# Patient Record
Sex: Male | Born: 1965 | Race: Black or African American | Hispanic: No | Marital: Single | Smoking: Current some day smoker
Health system: Southern US, Community
[De-identification: ages and names within clinical notes are randomized; demographics above are authoritative.]

## PROBLEM LIST (undated history)

## (undated) DIAGNOSIS — J449 Chronic obstructive pulmonary disease, unspecified: Secondary | ICD-10-CM

## (undated) DIAGNOSIS — F99 Mental disorder, not otherwise specified: Secondary | ICD-10-CM

## (undated) DIAGNOSIS — I509 Heart failure, unspecified: Secondary | ICD-10-CM

## (undated) DIAGNOSIS — E119 Type 2 diabetes mellitus without complications: Secondary | ICD-10-CM

## (undated) DIAGNOSIS — I10 Essential (primary) hypertension: Secondary | ICD-10-CM

## (undated) HISTORY — PX: NO PAST SURGERIES: SHX2092

---

## 2010-03-23 ENCOUNTER — Emergency Department: Payer: Self-pay | Admitting: Emergency Medicine

## 2010-04-11 ENCOUNTER — Ambulatory Visit: Payer: Self-pay | Admitting: Urology

## 2011-04-12 ENCOUNTER — Emergency Department: Payer: Self-pay | Admitting: Internal Medicine

## 2012-08-02 ENCOUNTER — Emergency Department: Payer: Self-pay | Admitting: Emergency Medicine

## 2014-06-17 ENCOUNTER — Emergency Department: Payer: Self-pay | Admitting: Internal Medicine

## 2016-08-10 ENCOUNTER — Encounter: Payer: Self-pay | Admitting: Emergency Medicine

## 2016-08-10 ENCOUNTER — Emergency Department
Admission: EM | Admit: 2016-08-10 | Discharge: 2016-08-10 | Disposition: A | Discharge status: Home / Self Care | Payer: Medicaid Other | Attending: Emergency Medicine | Admitting: Emergency Medicine

## 2016-08-10 ENCOUNTER — Emergency Department: Payer: Medicaid Other

## 2016-08-10 DIAGNOSIS — I10 Essential (primary) hypertension: Secondary | ICD-10-CM | POA: Insufficient documentation

## 2016-08-10 DIAGNOSIS — F17290 Nicotine dependence, other tobacco product, uncomplicated: Secondary | ICD-10-CM | POA: Insufficient documentation

## 2016-08-10 DIAGNOSIS — E119 Type 2 diabetes mellitus without complications: Secondary | ICD-10-CM | POA: Insufficient documentation

## 2016-08-10 HISTORY — DX: Essential (primary) hypertension: I10

## 2016-08-10 HISTORY — DX: Type 2 diabetes mellitus without complications: E11.9

## 2016-08-10 LAB — BASIC METABOLIC PANEL
Anion gap: 9 (ref 5–15)
BUN: 13 mg/dL (ref 6–20)
CHLORIDE: 104 mmol/L (ref 101–111)
CO2: 24 mmol/L (ref 22–32)
Calcium: 9.3 mg/dL (ref 8.9–10.3)
Creatinine, Ser: 0.99 mg/dL (ref 0.61–1.24)
GFR calc non Af Amer: >60 mL/min (ref >60)
Glucose, Bld: 188 mg/dL — ABNORMAL HIGH (ref 65–99)
POTASSIUM: 3.8 mmol/L (ref 3.5–5.1)
SODIUM: 137 mmol/L (ref 135–145)

## 2016-08-10 LAB — CBC
HEMATOCRIT: 44.6 % (ref 40.0–52.0)
Hemoglobin: 15.4 g/dL (ref 13.0–18.0)
MCH: 30.4 pg (ref 26.0–34.0)
MCHC: 34.4 g/dL (ref 32.0–36.0)
MCV: 88.4 fL (ref 80.0–100.0)
PLATELETS: 345 10*3/uL (ref 150–440)
RBC: 5.05 MIL/uL (ref 4.40–5.90)
RDW: 14.4 % (ref 11.5–14.5)
WBC: 7.2 10*3/uL (ref 3.8–10.6)

## 2016-08-10 LAB — TROPONIN I

## 2016-08-10 MED ORDER — CLONIDINE HCL 0.1 MG PO TABS
0.1000 mg | ORAL_TABLET | Freq: Once | ORAL | Status: AC
  Administered 2016-08-10: 0.1 mg via ORAL
  Filled 2016-08-10 (×2): qty 1

## 2016-08-10 MED ORDER — AMLODIPINE BESYLATE 10 MG PO TABS: 10.0000 mg | ORAL_TABLET | Freq: Every day | ORAL | 1 refills | Status: DC

## 2016-08-10 NOTE — Discharge Instructions (Signed)
As we discussed today's workup is normal. Please start taking Maxzide instead of 2 tablets each morning take 1 tablet in the morning, 1 tablet in the evening. Please also begin your new medication amlodipine 1 tablet each morning. Please follow-up with your primary care doctor in 1 week for recheck. Return to the emergency department for any acutely concerning symptoms.

## 2016-08-10 NOTE — ED Triage Notes (Signed)
Presents from group home with HTN. Takes meds for same.

## 2016-08-10 NOTE — ED Provider Notes (Signed)
[facility] Emergency Department Provider Note  Time seen: 4:56 PM  I have reviewed the triage vital signs and the nursing notes.   HISTORY  Chief Complaint Hypertension    HPI Paul Hernandez is a 50 y.o. male with a past medical history of diabetes, hypertension, who presents to the emergency department for an elevated blood pressure. According to the patientthey checked his blood pressure tends group home and it was elevated so they sent him to the emergency department for evaluation. Patient takes blood pressure medications, does not know the names of medications, but states has not missed any doses as they dose his medications at the group home for him. Denies any symptoms. Denies any chest pain.  Past Medical History:  Diagnosis Date  . Diabetes mellitus without complication (HCC)   . Hypertension     There are no active problems to display for this patient.   History reviewed. No pertinent surgical history.  Prior to Admission medications   Not on File    No Known Allergies  No family history on file.  Social History Social History  Substance Use Topics  . Smoking status: Current Some Day Smoker  . Smokeless tobacco: Current User  . Alcohol use No    Review of Systems Constitutional: Negative for fever Cardiovascular: Negative for chest pain. Respiratory: Negative for shortness of breath. Gastrointestinal: Negative for abdominal pain Neurological: Negative for headache 10-point ROS otherwise negative.  ____________________________________________   PHYSICAL EXAM:  VITAL SIGNS: ED Triage Vitals  Enc Vitals Group     BP 08/10/16 1416 (!) 182/127     Pulse Rate 08/10/16 1415 (!) 119     Resp 08/10/16 1415 20     Temp 08/10/16 1415 98.3 F (36.8 C)     Temp Source 08/10/16 1415 Oral     SpO2 08/10/16 1415 96 %     Weight 08/10/16 1409 247 lb (112 kg)     Height 08/10/16 1409 6' (1.829 m)     Head Circumference --      Peak  Flow --      Pain Score 08/10/16 1630 0     Pain Loc --      Pain Edu? --      Excl. in GC? --     Constitutional: Alert and oriented. Well appearing and in no distress. Eyes: Normal exam ENT   Head: Normocephalic and atraumatic.   Mouth/Throat: Mucous membranes are moist. Cardiovascular: Regular rhythm, rate around 100-110 bpm Respiratory: Normal respiratory effort without tachypnea nor retractions. Breath sounds are clear  Gastrointestinal: Soft and nontender. No distention.   Musculoskeletal: Nontender with normal range of motion in all extremities. No lower extremity tenderness or edema. Neurologic:  Normal speech and language. No gross focal neurologic deficits Skin:  Skin is warm, dry and intact.  Psychiatric: Mood and affect are normal. Speech and behavior are normal.   ____________________________________________    EKG  EKG reviewed and interpreted by myself shows sinus tachycardia at 117 bpm, slightly widened QRS with a normal axis, largely normal intervals with nonspecific ST changes but no ST elevation.  ____________________________________________    RADIOLOGY  Chest x-ray negative  ____________________________________________   INITIAL IMPRESSION / ASSESSMENT AND PLAN / ED COURSE  Pertinent labs & imaging results that were available during my care of the patient were reviewed by me and considered in my medical decision making (see chart for details).  Patient presents the emergency department for evaluation of elevated blood pressure.  Patient's blood pressure currently 188/133. Patient does not have his medication list with him. But states he takes medication for blood pressure which she has not missed. Patient has a primary care doctor which he sees. We will dose a one-time dose of clonidine in the emergency department and continue to monitor. Patient is symptom-free. His labs are normal.  I have reviewed the patient's home medications. He takes  Maxzide 37.5/12.52 tablets each morning as well as metoprolol 20 mg extended release once in the morning. I have reviewed the patient's blood pressures, the aide believes the pressures are better in the morning, worse in the afternoon/evening's. However in reviewing the blood pressures they seem to be consistently elevated around 150 systolic 100-120 diastolic. Patient is symptom-free. With a normal workup today. I discussed starting the Maxide twice a day instead of once daily. We will also add a 10 mg amlodipine pill to be taken once each morning. Patient will follow up with his primary care doctor in 1 week for recheck.  ____________________________________________   FINAL CLINICAL IMPRESSION(S) / ED DIAGNOSES  Hypertension    Minna AntisKevin Angelisse Riso, MD 08/10/16 [phone]

## 2016-08-10 NOTE — ED Notes (Signed)
MD at bedside. 

## 2017-02-14 ENCOUNTER — Emergency Department
Admission: EM | Admit: 2017-02-14 | Discharge: 2017-02-14 | Disposition: A | Discharge status: Home / Self Care | Payer: Medicaid Other | Attending: Emergency Medicine | Admitting: Emergency Medicine

## 2017-02-14 ENCOUNTER — Encounter: Payer: Self-pay | Admitting: *Deleted

## 2017-02-14 DIAGNOSIS — Z79899 Other long term (current) drug therapy: Secondary | ICD-10-CM | POA: Insufficient documentation

## 2017-02-14 DIAGNOSIS — F17200 Nicotine dependence, unspecified, uncomplicated: Secondary | ICD-10-CM | POA: Insufficient documentation

## 2017-02-14 DIAGNOSIS — E119 Type 2 diabetes mellitus without complications: Secondary | ICD-10-CM | POA: Diagnosis not present

## 2017-02-14 DIAGNOSIS — Z7984 Long term (current) use of oral hypoglycemic drugs: Secondary | ICD-10-CM | POA: Insufficient documentation

## 2017-02-14 DIAGNOSIS — I10 Essential (primary) hypertension: Secondary | ICD-10-CM | POA: Diagnosis not present

## 2017-02-14 NOTE — ED Provider Notes (Signed)
[facility] Emergency Department Provider Note   ____________________________________________   First MD Initiated Contact with Patient 02/14/17 1007     (approximate)  I have reviewed the triage vital signs and the nursing notes.   HISTORY  Chief Complaint Hypertension    HPI Paul Hernandez is a 51 y.o. male display in today by a group home caregiver who states that they were instructed to bring him here to the emergency room after his blood pressure was elevated at [facility].  Patient was there yesterday to get a Haldol injection and was told that his blood pressure was elevated. He denies any headache, chest pain, shortness of breath. He continues to take his blood pressure medication as directed. Patient states that his medication is dispensed by people at his group home. Records that are with him and carried by the caregiver indicated patient is on antihypertensives. These medications are checked AND appear that he has been giving them daily. Patient denies any pain.   Past Medical History:  Diagnosis Date  . Diabetes mellitus without complication (HCC)   . Hypertension     There are no active problems to display for this patient.   History reviewed. No pertinent surgical history.  Prior to Admission medications   Medication Sig Start Date End Date Taking? Authorizing Provider  albuterol (PROVENTIL HFA;VENTOLIN HFA) 108 (90 Base) MCG/ACT inhaler Inhale 2 puffs into the lungs every 6 (six) hours as needed for wheezing or shortness of breath.   Yes Historical Provider, MD  amLODipine-benazepril (LOTREL) 10-20 MG capsule Take 1 capsule by mouth daily.   Yes Historical Provider, MD  atorvastatin (LIPITOR) 40 MG tablet Take 40 mg by mouth daily.   Yes Historical Provider, MD  diphenhydrAMINE (BENADRYL) 25 mg capsule Take 25 mg by mouth at bedtime as needed for sleep.   Yes Historical Provider, MD  metFORMIN (GLUCOPHAGE) 1000 MG tablet Take 1,000 mg by mouth  at bedtime.   Yes Historical Provider, MD  metFORMIN (GLUCOPHAGE) 500 MG tablet Take 1,500 mg by mouth daily with breakfast.   Yes Historical Provider, MD  metoprolol (TOPROL-XL) 200 MG 24 hr tablet Take 200 mg by mouth daily.   Yes Historical Provider, MD  triamterene-hydrochlorothiazide (DYAZIDE) 37.5-25 MG capsule Take 1 capsule by mouth daily.   Yes Historical Provider, MD  amLODipine (NORVASC) 10 MG tablet Take 1 tablet (10 mg total) by mouth daily. 08/10/16 08/10/17  Minna Antis, MD    Allergies Asa [aspirin]  History reviewed. No pertinent family history.  Social History Social History  Substance Use Topics  . Smoking status: Current Some Day Smoker  . Smokeless tobacco: Current User  . Alcohol use No    Review of Systems  Constitutional: No fever/chills Eyes: No visual changes. ENT: No sore throat. Cardiovascular: Denies chest pain. Respiratory: Denies shortness of breath. Gastrointestinal: No abdominal pain.  No nausea, no vomiting.  Musculoskeletal: Negative for back pain. Skin: Negative for rash. Neurological: Negative for headaches, focal weakness or numbness. Psychiatric:Positive psychiatric illness. Endocrine:Positive diabetes.  ____________________________________________   PHYSICAL EXAM:  VITAL SIGNS: ED Triage Vitals  Enc Vitals Group     BP 02/14/17 0901 (!) 156/110     Pulse Rate 02/14/17 0901 86     Resp 02/14/17 0901 16     Temp 02/14/17 0901 98.4 F (36.9 C)     Temp Source 02/14/17 0901 Oral     SpO2 02/14/17 0901 98 %     Weight 02/14/17 0902 236 lb (107  kg)     Height 02/14/17 0902  (1.803 m)     Head Circumference --      Peak Flow --      Pain Score --      Pain Loc --      Pain Edu? --      Excl. in GC? --     Constitutional: Alert and oriented. Well appearing and in no acute distress.Patient is very talkative and active. Eyes: Conjunctivae are normal. PERRL. EOMI. Head: Atraumatic. Nose: No  congestion/rhinnorhea. Neck: No stridor.   Cardiovascular: Normal rate, regular rhythm. Grossly normal heart sounds.  Good peripheral circulation. Respiratory: Normal respiratory effort.  No retractions. Lungs CTAB. Gastrointestinal: Soft and nontender. No distention.  Musculoskeletal: Moves upper and lower extremities without difficulty. Normal gait was noted. Neurologic:  Normal speech and language. No gross focal neurologic deficits are appreciated. No gait instability. Skin:  Skin is warm, dry and intact. No rash noted. Psychiatric: Mood and affect are normal. Speech and behavior are normal.  ____________________________________________   LABS (all labs ordered are listed, but only abnormal results are displayed)  Labs Reviewed - No data to display  PROCEDURES  Procedure(s) performed: None  Procedures  Critical Care performed: No  ____________________________________________   INITIAL IMPRESSION / ASSESSMENT AND PLAN / ED COURSE  Pertinent labs & imaging results that were available during my care of the patient were reviewed by me and considered in my medical decision making (see chart for details).  Patient is continue taking his current medication. Caregiver states that his PCP makes rounds on the group home once a month. He is to definitely be reevaluated at that time. No changes in his medication were made today.      ____________________________________________   FINAL CLINICAL IMPRESSION(S) / ED DIAGNOSES  Final diagnoses:  Essential hypertension  Uncontrolled hypertension      NEW MEDICATIONS STARTED DURING THIS VISIT:  Discharge Medication List as of 02/14/2017 10:59 AM       Note:  This document was prepared using Dragon voice recognition software and may include unintentional dictation errors.    Tommi Rumps, PA-C 02/14/17 1208    Sharman Cheek, MD 02/19/17 [phone]

## 2017-02-14 NOTE — Discharge Instructions (Signed)
Have your blood pressure recheck and see your provider for re-evaluation of your blood pressure.  Continue your regular medications

## 2017-02-14 NOTE — ED Notes (Signed)
See triage note  Per caregiver he went to [facility] yesterday for haldol injection and they noticed that his b/p was elevated  Denies any sx's has b/p meds ordered but does take on a regular basis

## 2017-02-14 NOTE — ED Triage Notes (Addendum)
Pt states high BP yesterday and this AM, 151/111, states he has hx of HTN and has BP meds but does not taken medication consistently because he is afraid of "overdosing", pt from group home and rep states "I give him the meds but I dont know what hes talking about he takes what he wants", pt denies any pain, denies any SOB, denies any SOB, awake and alert in no cute distress

## 2017-02-14 NOTE — ED Notes (Signed)
Pt given coke 

## 2017-08-10 ENCOUNTER — Emergency Department
Admission: EM | Admit: 2017-08-10 | Discharge: 2017-08-10 | Disposition: A | Discharge status: Home / Self Care | Payer: Medicare Other | Attending: Emergency Medicine | Admitting: Emergency Medicine

## 2017-08-10 ENCOUNTER — Emergency Department: Payer: Medicare Other

## 2017-08-10 ENCOUNTER — Encounter: Payer: Self-pay | Admitting: Emergency Medicine

## 2017-08-10 DIAGNOSIS — F17220 Nicotine dependence, chewing tobacco, uncomplicated: Secondary | ICD-10-CM | POA: Diagnosis not present

## 2017-08-10 DIAGNOSIS — Z79899 Other long term (current) drug therapy: Secondary | ICD-10-CM | POA: Diagnosis not present

## 2017-08-10 DIAGNOSIS — I10 Essential (primary) hypertension: Secondary | ICD-10-CM | POA: Insufficient documentation

## 2017-08-10 DIAGNOSIS — E119 Type 2 diabetes mellitus without complications: Secondary | ICD-10-CM | POA: Diagnosis not present

## 2017-08-10 DIAGNOSIS — Z7984 Long term (current) use of oral hypoglycemic drugs: Secondary | ICD-10-CM | POA: Insufficient documentation

## 2017-08-10 DIAGNOSIS — F17200 Nicotine dependence, unspecified, uncomplicated: Secondary | ICD-10-CM | POA: Insufficient documentation

## 2017-08-10 DIAGNOSIS — J9801 Acute bronchospasm: Secondary | ICD-10-CM | POA: Diagnosis not present

## 2017-08-10 DIAGNOSIS — R0602 Shortness of breath: Secondary | ICD-10-CM | POA: Diagnosis present

## 2017-08-10 MED ORDER — PREDNISONE 20 MG PO TABS
50.0000 mg | ORAL_TABLET | Freq: Once | ORAL | Status: AC
  Administered 2017-08-10: 50 mg via ORAL
  Filled 2017-08-10: qty 2

## 2017-08-10 MED ORDER — IPRATROPIUM-ALBUTEROL 0.5-2.5 (3) MG/3ML IN SOLN
3.0000 mL | Freq: Once | RESPIRATORY_TRACT | Status: AC
  Administered 2017-08-10: 3 mL via RESPIRATORY_TRACT
  Filled 2017-08-10: qty 3

## 2017-08-10 MED ORDER — ALBUTEROL SULFATE (2.5 MG/3ML) 0.083% IN NEBU
5.0000 mg | INHALATION_SOLUTION | Freq: Once | RESPIRATORY_TRACT | Status: AC
  Administered 2017-08-10: 5 mg via RESPIRATORY_TRACT
  Filled 2017-08-10: qty 6

## 2017-08-10 MED ORDER — PREDNISONE 50 MG PO TABS: 50.0000 mg | ORAL_TABLET | Freq: Every day | ORAL | 0 refills | Status: DC

## 2017-08-10 NOTE — ED Notes (Signed)
ED Provider at bedside. 

## 2017-08-10 NOTE — ED Triage Notes (Signed)
Pt arrives POV from [facility] and [facility] Pt is his own legal guardian. Pt states that it became worse tonight and "know that it's that soda drinking". Pt is able to talk in complete sentences without distress and is in NAD.

## 2017-08-10 NOTE — ED Provider Notes (Signed)
[facility] Emergency Department Provider Note   ____________________________________________    I have reviewed the triage vital signs and the nursing notes.   HISTORY  Chief Complaint Shortness of Breath     HPI Paul Hernandez is a 51 y.o. male presents with complaints of shortness of breath.  The patient reports he has had difficulty breathing for up to a week.  He denies chest pain.  Denies fevers or chills.  Intermittent dry cough.  He does admit to smoking cigarettes.  Has not taken anything for this.  He is here with caregiver   Past Medical History:  Diagnosis Date  . Diabetes mellitus without complication (HCC)   . Hypertension     There are no active problems to display for this patient.   History reviewed. No pertinent surgical history.  Prior to Admission medications   Medication Sig Start Date End Date Taking? Authorizing Provider  albuterol (PROVENTIL HFA;VENTOLIN HFA) 108 (90 Base) MCG/ACT inhaler Inhale 2 puffs into the lungs every 6 (six) hours as needed for wheezing or shortness of breath.    [provider]  amLODipine (NORVASC) 10 MG tablet Take 1 tablet (10 mg total) by mouth daily. 08/10/16 08/10/17  Minna Antis, MD  amLODipine-benazepril (LOTREL) 10-20 MG capsule Take 1 capsule by mouth daily.    [provider]  atorvastatin (LIPITOR) 40 MG tablet Take 40 mg by mouth daily.    [provider]  diphenhydrAMINE (BENADRYL) 25 mg capsule Take 25 mg by mouth at bedtime as needed for sleep.    [provider]  metFORMIN (GLUCOPHAGE) 1000 MG tablet Take 1,000 mg by mouth at bedtime.    [provider]  metFORMIN (GLUCOPHAGE) 500 MG tablet Take 1,500 mg by mouth daily with breakfast.    [provider]  metoprolol (TOPROL-XL) 200 MG 24 hr tablet Take 200 mg by mouth daily.    [provider]  predniSONE (DELTASONE) 50 MG tablet Take 1 tablet (50 mg total) by  mouth daily with breakfast. 08/10/17   Jene Every, MD  triamterene-hydrochlorothiazide (DYAZIDE) 37.5-25 MG capsule Take 1 capsule by mouth daily.    [provider]     Allergies Asa [aspirin]  No family history on file.  Social History Social History  Substance Use Topics  . Smoking status: Current Some Day Smoker    Packs/day: 0.50  . Smokeless tobacco: Current User  . Alcohol use No    Review of Systems  Constitutional: No fever/chills Eyes: No visual changes.  ENT: No throat swelling Cardiovascular: Denies chest pain. Respiratory: As above Gastrointestinal: No abdominal pain.  Genitourinary: Negative for dysuria. Musculoskeletal: Negative for back pain. Skin: Negative for rash. Neurological: Negative for headaches    ____________________________________________   PHYSICAL EXAM:  VITAL SIGNS: ED Triage Vitals  Enc Vitals Group     BP 08/10/17 1935 102/60     Pulse Rate 08/10/17 1935 (!) 126     Resp 08/10/17 1935 18     Temp 08/10/17 1935 97.8 F (36.6 C)     Temp Source 08/10/17 1935 Oral     SpO2 08/10/17 1935 96 %     Weight 08/10/17 1936 108.4 kg (239 lb)     Height 08/10/17 1936 1.829 m (6')     Head Circumference --      Peak Flow --      Pain Score 08/10/17 1935 0     Pain Loc --  Pain Edu? --      Excl. in GC? --     Constitutional: Alert and oriented. No acute distress. Pleasant and interactive Eyes: Conjunctivae are normal.   Nose: No congestion/rhinnorhea. Mouth/Throat: Mucous membranes are moist.    Cardiovascular: Mild tachycardia, regular rhythm. Grossly normal heart sounds.  Good peripheral circulation. Respiratory: Mild increased respiratory effort, no retractions, scattered wheezes but no rales  gastrointestinal: Soft and nontender. No distention.  No CVA tenderness. Genitourinary: deferred Musculoskeletal:   Warm and well perfused, no calf tenderness Neurologic: No gross focal neurologic deficits are  appreciated.  Skin:  Skin is warm, dry and intact. No rash noted.   ____________________________________________   LABS (all labs ordered are listed, but only abnormal results are displayed)  Labs Reviewed - No data to display ____________________________________________  EKG  ED ECG REPORT I, Jene EveryKINNER, Mckennon Zwart, the attending physician, personally viewed and interpreted this ECG.  Date: 08/10/2017  Rate: 128 Rhythm: Sinus tachycardia QRS Axis: normal Intervals: normal ST/T Wave abnormalities: Nonspecific changes   ____________________________________________  RADIOLOGY  Chest x-ray mild changes of early CHF ____________________________________________   PROCEDURES  Procedure(s) performed: No    Critical Care performed: No ____________________________________________   INITIAL IMPRESSION / ASSESSMENT AND PLAN / ED COURSE  Pertinent labs & imaging results that were available during my care of the patient were reviewed by me and considered in my medical decision making (see chart for details).  Patient presents with shortness of breath, on exam he is wheezing significantly.  His tachycardia is explained by significant amount of anxiety as well.  We will treat with DuoNeb and p.o. prednisone and reevaluate   After nebulizers patient significantly improved wheezing on exam.  His heart rate has improved greatly.  He feels well and is anxious to go home.  I feel this is reasonable.  We discussed return precautions with he and his family.  Will treat with prednisone as an outpatient    ____________________________________________   FINAL CLINICAL IMPRESSION(S) / ED DIAGNOSES  Final diagnoses:  Bronchospasm, acute      NEW MEDICATIONS STARTED DURING THIS VISIT:  Discharge Medication List as of 08/10/2017 10:31 PM    START taking these medications   Details  predniSONE (DELTASONE) 50 MG tablet Take 1 tablet (50 mg total) by mouth daily with breakfast.,  Starting Fri 08/10/2017, Print         Note:  This document was prepared using Dragon voice recognition software and may include unintentional dictation errors.    Jene EveryKinner, Meghan Warshawsky, MD 08/10/17 2329

## 2017-08-13 ENCOUNTER — Encounter: Payer: Self-pay | Admitting: Emergency Medicine

## 2017-08-13 ENCOUNTER — Emergency Department
Admission: EM | Admit: 2017-08-13 | Discharge: 2017-08-13 | Disposition: A | Discharge status: Home / Self Care | Payer: Medicare Other | Attending: Emergency Medicine | Admitting: Emergency Medicine

## 2017-08-13 ENCOUNTER — Emergency Department: Payer: Medicare Other

## 2017-08-13 DIAGNOSIS — Z7984 Long term (current) use of oral hypoglycemic drugs: Secondary | ICD-10-CM | POA: Insufficient documentation

## 2017-08-13 DIAGNOSIS — E119 Type 2 diabetes mellitus without complications: Secondary | ICD-10-CM | POA: Diagnosis not present

## 2017-08-13 DIAGNOSIS — R062 Wheezing: Secondary | ICD-10-CM | POA: Insufficient documentation

## 2017-08-13 DIAGNOSIS — I10 Essential (primary) hypertension: Secondary | ICD-10-CM | POA: Insufficient documentation

## 2017-08-13 DIAGNOSIS — F17210 Nicotine dependence, cigarettes, uncomplicated: Secondary | ICD-10-CM

## 2017-08-13 DIAGNOSIS — J9801 Acute bronchospasm: Secondary | ICD-10-CM | POA: Diagnosis not present

## 2017-08-13 DIAGNOSIS — R0602 Shortness of breath: Secondary | ICD-10-CM | POA: Diagnosis present

## 2017-08-13 MED ORDER — IPRATROPIUM-ALBUTEROL 0.5-2.5 (3) MG/3ML IN SOLN
3.0000 mL | Freq: Once | RESPIRATORY_TRACT | Status: AC
  Administered 2017-08-13: 3 mL via RESPIRATORY_TRACT
  Filled 2017-08-13: qty 3

## 2017-08-13 MED ORDER — ALBUTEROL SULFATE HFA 108 (90 BASE) MCG/ACT IN AERS: 2.0000 | INHALATION_SPRAY | Freq: Four times a day (QID) | RESPIRATORY_TRACT | 2 refills | Status: DC | PRN

## 2017-08-13 MED ORDER — PREDNISONE 10 MG PO TABS: ORAL_TABLET | ORAL | 0 refills | Status: DC

## 2017-08-13 MED ORDER — ALBUTEROL SULFATE (2.5 MG/3ML) 0.083% IN NEBU
5.0000 mg | INHALATION_SOLUTION | Freq: Once | RESPIRATORY_TRACT | Status: AC
  Administered 2017-08-13: 2.5 mg via RESPIRATORY_TRACT

## 2017-08-13 MED ORDER — ALBUTEROL SULFATE (2.5 MG/3ML) 0.083% IN NEBU
INHALATION_SOLUTION | RESPIRATORY_TRACT | Status: AC
  Filled 2017-08-13: qty 3

## 2017-08-13 NOTE — ED Triage Notes (Signed)
Pt with sob for few days.  Has not been using his inhaler.  Lives at [facility].  Arrives sob.  Given albuterol neb on arrival with visible improvement.

## 2017-08-13 NOTE — ED Provider Notes (Signed)
[facility] Emergency Department Provider Note  ____________________________________________   First MD Initiated Contact with Patient 08/13/17 [phone]     (approximate)  I have reviewed the triage vital signs and the nursing notes.   HISTORY  Chief Complaint Shortness of Breath and Wheezing   HPI Paul Hernandez is a 51 y.o. male she is complaining of shortness of breath for the last few days. Patient states that he has not been using his inhaler. Patient currently lives at a group home and caregiver is with  him today. She states she has been taking his prednisone as directed since his last ED visit. Patient denies any other changes such as nausea, vomiting, diarrhea or fever and chills. Patient has continued to smoke during this time and was smoking a cigarette this morning when he felt short of breath. Patient is somewhat anxious He denies any chest pain, dizziness, headache.   Past Medical History:  Diagnosis Date  . Diabetes mellitus without complication (HCC)   . Hypertension     There are no active problems to display for this patient.   History reviewed. No pertinent surgical history.  Prior to Admission medications   Medication Sig Start Date End Date Taking? Authorizing Provider  albuterol (PROVENTIL HFA;VENTOLIN HFA) 108 (90 Base) MCG/ACT inhaler Inhale 2 puffs into the lungs every 6 (six) hours as needed for wheezing or shortness of breath. 08/13/17   Tommi RumpsSummers, Zacharia Sowles L, PA-C  amLODipine (NORVASC) 10 MG tablet Take 1 tablet (10 mg total) by mouth daily. 08/10/16 08/10/17  Minna AntisPaduchowski, Kevin, MD  amLODipine-benazepril (LOTREL) 10-20 MG capsule Take 1 capsule by mouth daily.    [provider]  atorvastatin (LIPITOR) 40 MG tablet Take 40 mg by mouth daily.    [provider]  diphenhydrAMINE (BENADRYL) 25 mg capsule Take 25 mg by mouth at bedtime as needed for sleep.    [provider]  metFORMIN (GLUCOPHAGE) 1000 MG  tablet Take 1,000 mg by mouth at bedtime.    [provider]  metFORMIN (GLUCOPHAGE) 500 MG tablet Take 1,500 mg by mouth daily with breakfast.    [provider]  metoprolol (TOPROL-XL) 200 MG 24 hr tablet Take 200 mg by mouth daily.    [provider]  predniSONE (DELTASONE) 10 MG tablet Take 5 tablets once a day x 2 days 08/13/17   Tommi RumpsSummers, Monique Hefty L, PA-C  triamterene-hydrochlorothiazide (DYAZIDE) 37.5-25 MG capsule Take 1 capsule by mouth daily.    [provider]    Allergies Asa [aspirin]  No family history on file.  Social History Social History  Substance Use Topics  . Smoking status: Current Some Day Smoker    Packs/day: 0.50  . Smokeless tobacco: Current User  . Alcohol use No    Review of Systems Constitutional: No fever/chills ENT: No sore throat. Cardiovascular: Denies chest pain. Respiratory: positive shortness of breath. Gastrointestinal: No abdominal pain.  No nausea, no vomiting.  No diarrhea.  Musculoskeletal: Negative for muscle aches. Skin: Negative for rash. Neurological: Negative for headaches, focal weakness or numbness. ____________________________________________   PHYSICAL EXAM:  VITAL SIGNS: ED Triage Vitals  Enc Vitals Group     BP 08/13/17 0839 (!) 164/113     Pulse Rate 08/13/17 0845 (!) 114     Resp 08/13/17 0839 (!) 22     Temp 08/13/17 0859 98.3 F (36.8 C)     Temp Source 08/13/17 0859 Oral     SpO2 08/13/17 0839 100 %  Weight 08/13/17 0841 239 lb (108.4 kg)     Height 08/13/17 0841 6' (1.829 m)     Head Circumference --      Peak Flow --      Pain Score 08/13/17 0838 0     Pain Loc --      Pain Edu? --      Excl. in GC? --    Constitutional: Alert and oriented. Well appearing and in no acute distress. Eyes: Conjunctivae are normal.  Head: Atraumatic. Nose: No congestion/rhinnorhea. Mouth/Throat: Mucous membranes are moist.  Oropharynx non-erythematous. Neck: No stridor.     Hematological/Lymphatic/Immunilogical: No cervical lymphadenopathy. Cardiovascular: Normal rate, regular rhythm. Grossly normal heart sounds.  Good peripheral circulation. Respiratory: Normal respiratory effort.  No retractions. Lungs faint bilateral wheezes noted. Patient is in no  any acute distress and is able to speak in complete sentences without any difficulty. Gastrointestinal: Soft and nontender. No distention.  Musculoskeletal: patient is ambulatory without assistance. Neurologic:  Normal speech and language. No gross focal neurologic deficits are appreciated. No gait instability. Skin:  Skin is warm, dry and intact. No rash noted. Psychiatric: Mood and affect are normal. Speech and behavior are normal.  ____________________________________________   LABS (all labs ordered are listed, but only abnormal results are displayed)  Labs Reviewed - No data to display  RADIOLOGY  Dg Chest 2 View  Result Date: 08/13/2017 CLINICAL DATA:  Shortness of breath. EXAM: CHEST  2 VIEW COMPARISON:  08/10/2017 FINDINGS: The heart size and mediastinal contours are within normal limits. Diffuse bronchovascular prominence again noted without focal airspace consolidation or pleural fluid. Findings are suggestive of bronchitis and potentially a component of mild edema. No pneumothorax. The visualized skeletal structures are unremarkable. IMPRESSION: Diffuse bronchovascular prominence suggestive of bronchitis and potentially component of mild edema. Electronically Signed   By: Irish Lack M.D.   On: 08/13/2017 09:03    ____________________________________________   PROCEDURES  Procedure(s) performed: None  Procedures  Critical Care performed: No  ____________________________________________   INITIAL IMPRESSION / ASSESSMENT AND PLAN / ED COURSE  Patient was given albuterol nebulizer solution while in the triage area prior to being seen. He did well with a DuoNeb treatment. We discussed  his prednisone and caregiver states that they have 2 pills left in his bottle. Patient also states that he does not have an inhaler at the group home. Patient was stable and was eager to leave. We did discuss his blood pressure being elevated today and that he should have this checked by his PCP. Patient was discharged with an albuterol inhaler and prednisone for the next 3 days.  he is encouraged to discontinue smoking which is aggravating his breathing. Patient voices understanding.     ____________________________________________   FINAL CLINICAL IMPRESSION(S) / ED DIAGNOSES  Final diagnoses:  Acute bronchospasm  Hypertension, uncontrolled  Cigarette smoker      NEW MEDICATIONS STARTED DURING THIS VISIT:  Discharge Medication List as of 08/13/2017 11:22 AM       Note:  This document was prepared using Dragon voice recognition software and may include unintentional dictation errors.    Tommi Rumps, PA-C 08/13/17 1555    Rockne Menghini, MD 08/14/17 2300

## 2017-08-13 NOTE — Discharge Instructions (Signed)
Make an appointment to see your primary care doctor for a recheck. Continue albuterol 2 puffs every 6 hours as needed for wheezing or shortness of breath. Prednisone 5 tablets each day continuing for next or 2 days. Discontinue smoking.

## 2017-08-13 NOTE — ED Notes (Signed)
Pt in WR and is appearing more relaxed and breathing easier.

## 2017-09-16 ENCOUNTER — Encounter: Payer: Self-pay | Admitting: Emergency Medicine

## 2017-09-16 ENCOUNTER — Inpatient Hospital Stay
Admission: EM | Admit: 2017-09-16 | Discharge: 2017-09-18 | DRG: 191 | Disposition: A | Discharge status: Home / Self Care | Payer: Medicare Other | Attending: Internal Medicine | Admitting: Internal Medicine

## 2017-09-16 ENCOUNTER — Other Ambulatory Visit: Payer: Self-pay

## 2017-09-16 ENCOUNTER — Emergency Department: Payer: Medicare Other

## 2017-09-16 DIAGNOSIS — F17210 Nicotine dependence, cigarettes, uncomplicated: Secondary | ICD-10-CM | POA: Diagnosis present

## 2017-09-16 DIAGNOSIS — E119 Type 2 diabetes mellitus without complications: Secondary | ICD-10-CM | POA: Diagnosis present

## 2017-09-16 DIAGNOSIS — R778 Other specified abnormalities of plasma proteins: Secondary | ICD-10-CM

## 2017-09-16 DIAGNOSIS — J441 Chronic obstructive pulmonary disease with (acute) exacerbation: Secondary | ICD-10-CM | POA: Diagnosis present

## 2017-09-16 DIAGNOSIS — I472 Ventricular tachycardia: Secondary | ICD-10-CM | POA: Diagnosis present

## 2017-09-16 DIAGNOSIS — Z23 Encounter for immunization: Secondary | ICD-10-CM

## 2017-09-16 DIAGNOSIS — R7989 Other specified abnormal findings of blood chemistry: Secondary | ICD-10-CM

## 2017-09-16 DIAGNOSIS — I160 Hypertensive urgency: Secondary | ICD-10-CM | POA: Diagnosis present

## 2017-09-16 DIAGNOSIS — E669 Obesity, unspecified: Secondary | ICD-10-CM | POA: Diagnosis present

## 2017-09-16 DIAGNOSIS — Z8249 Family history of ischemic heart disease and other diseases of the circulatory system: Secondary | ICD-10-CM | POA: Diagnosis not present

## 2017-09-16 DIAGNOSIS — R748 Abnormal levels of other serum enzymes: Secondary | ICD-10-CM | POA: Diagnosis present

## 2017-09-16 DIAGNOSIS — I10 Essential (primary) hypertension: Secondary | ICD-10-CM | POA: Diagnosis present

## 2017-09-16 DIAGNOSIS — F209 Schizophrenia, unspecified: Secondary | ICD-10-CM | POA: Diagnosis present

## 2017-09-16 DIAGNOSIS — Z823 Family history of stroke: Secondary | ICD-10-CM

## 2017-09-16 DIAGNOSIS — Z7984 Long term (current) use of oral hypoglycemic drugs: Secondary | ICD-10-CM | POA: Diagnosis not present

## 2017-09-16 LAB — CBC
HCT: 45.9 % (ref 40.0–52.0)
Hemoglobin: 14.8 g/dL (ref 13.0–18.0)
MCH: 29 pg (ref 26.0–34.0)
MCHC: 32.3 g/dL (ref 32.0–36.0)
MCV: 89.8 fL (ref 80.0–100.0)
PLATELETS: 319 10*3/uL (ref 150–440)
RBC: 5.12 MIL/uL (ref 4.40–5.90)
RDW: 14 % (ref 11.5–14.5)
WBC: 6.5 10*3/uL (ref 3.8–10.6)

## 2017-09-16 LAB — URINE DRUG SCREEN, QUALITATIVE (ARMC ONLY)
AMPHETAMINES, UR SCREEN: NOT DETECTED
BENZODIAZEPINE, UR SCRN: NOT DETECTED
Barbiturates, Ur Screen: NOT DETECTED
CANNABINOID 50 NG, UR ~~LOC~~: NOT DETECTED
Cocaine Metabolite,Ur ~~LOC~~: NOT DETECTED
MDMA (Ecstasy)Ur Screen: NOT DETECTED
Methadone Scn, Ur: NOT DETECTED
OPIATE, UR SCREEN: NOT DETECTED
PHENCYCLIDINE (PCP) UR S: NOT DETECTED
Tricyclic, Ur Screen: NOT DETECTED

## 2017-09-16 LAB — BASIC METABOLIC PANEL
Anion gap: 11 (ref 5–15)
BUN: 15 mg/dL (ref 6–20)
CALCIUM: 9 mg/dL (ref 8.9–10.3)
CO2: 27 mmol/L (ref 22–32)
CREATININE: 0.97 mg/dL (ref 0.61–1.24)
Chloride: 100 mmol/L — ABNORMAL LOW (ref 101–111)
GFR calc Af Amer: >60 mL/min (ref >60)
GLUCOSE: 226 mg/dL — AB (ref 65–99)
POTASSIUM: 3.8 mmol/L (ref 3.5–5.1)
SODIUM: 138 mmol/L (ref 135–145)

## 2017-09-16 LAB — GLUCOSE, CAPILLARY
GLUCOSE-CAPILLARY: 254 mg/dL — AB (ref 65–99)
GLUCOSE-CAPILLARY: 324 mg/dL — AB (ref 65–99)

## 2017-09-16 LAB — TROPONIN I
TROPONIN I: 0.05 ng/mL — AB (ref <0.03)
TROPONIN I: 0.05 ng/mL — AB (ref <0.03)
Troponin I: 0.06 ng/mL (ref <0.03)

## 2017-09-16 LAB — BRAIN NATRIURETIC PEPTIDE: B NATRIURETIC PEPTIDE 5: 680 pg/mL — AB (ref 0.0–100.0)

## 2017-09-16 MED ORDER — LORAZEPAM 1 MG PO TABS
1.0000 mg | ORAL_TABLET | Freq: Once | ORAL | Status: AC
  Administered 2017-09-16: 1 mg via ORAL
  Filled 2017-09-16: qty 1

## 2017-09-16 MED ORDER — METHYLPREDNISOLONE SODIUM SUCC 125 MG IJ SOLR
125.0000 mg | Freq: Once | INTRAMUSCULAR | Status: AC
  Administered 2017-09-16: 125 mg via INTRAVENOUS
  Filled 2017-09-16: qty 2

## 2017-09-16 MED ORDER — METFORMIN HCL 500 MG PO TABS
1000.0000 mg | ORAL_TABLET | Freq: Two times a day (BID) | ORAL | Status: DC
  Administered 2017-09-16 – 2017-09-18 (×4): 1000 mg via ORAL
  Filled 2017-09-16 (×4): qty 2

## 2017-09-16 MED ORDER — MAGNESIUM SULFATE 2 GM/50ML IV SOLN
2.0000 g | Freq: Once | INTRAVENOUS | Status: AC
  Administered 2017-09-16: 2 g via INTRAVENOUS
  Filled 2017-09-16: qty 50

## 2017-09-16 MED ORDER — INSULIN ASPART 100 UNIT/ML ~~LOC~~ SOLN
3.0000 [IU] | Freq: Three times a day (TID) | SUBCUTANEOUS | Status: DC
  Administered 2017-09-16 – 2017-09-17 (×3): 3 [IU] via SUBCUTANEOUS
  Filled 2017-09-16 (×3): qty 1

## 2017-09-16 MED ORDER — BENAZEPRIL HCL 20 MG PO TABS
20.0000 mg | ORAL_TABLET | Freq: Every day | ORAL | Status: DC
  Administered 2017-09-16 – 2017-09-17 (×2): 20 mg via ORAL
  Filled 2017-09-16 (×2): qty 1

## 2017-09-16 MED ORDER — BENZTROPINE MESYLATE 1 MG PO TABS
2.0000 mg | ORAL_TABLET | Freq: Every day | ORAL | Status: DC
  Administered 2017-09-16 – 2017-09-17 (×2): 2 mg via ORAL
  Filled 2017-09-16 (×2): qty 2

## 2017-09-16 MED ORDER — ENOXAPARIN SODIUM 40 MG/0.4ML ~~LOC~~ SOLN
40.0000 mg | SUBCUTANEOUS | Status: DC
  Administered 2017-09-16 – 2017-09-17 (×2): 40 mg via SUBCUTANEOUS
  Filled 2017-09-16 (×2): qty 0.4

## 2017-09-16 MED ORDER — METHYLPREDNISOLONE SODIUM SUCC 40 MG IJ SOLR
40.0000 mg | Freq: Every day | INTRAMUSCULAR | Status: DC
  Administered 2017-09-17 – 2017-09-18 (×2): 40 mg via INTRAVENOUS
  Filled 2017-09-16 (×2): qty 1

## 2017-09-16 MED ORDER — FUROSEMIDE 10 MG/ML IJ SOLN
40.0000 mg | Freq: Once | INTRAMUSCULAR | Status: AC
  Administered 2017-09-16: 40 mg via INTRAVENOUS
  Filled 2017-09-16: qty 4

## 2017-09-16 MED ORDER — AMLODIPINE BESYLATE 5 MG PO TABS
5.0000 mg | ORAL_TABLET | Freq: Once | ORAL | Status: AC
  Administered 2017-09-16: 5 mg via ORAL
  Filled 2017-09-16: qty 1

## 2017-09-16 MED ORDER — METOPROLOL SUCCINATE ER 50 MG PO TB24
200.0000 mg | ORAL_TABLET | Freq: Every day | ORAL | Status: DC
  Administered 2017-09-17: 200 mg via ORAL
  Filled 2017-09-16 (×2): qty 4

## 2017-09-16 MED ORDER — BUDESONIDE 0.5 MG/2ML IN SUSP
0.5000 mg | Freq: Two times a day (BID) | RESPIRATORY_TRACT | Status: DC
  Administered 2017-09-16 – 2017-09-18 (×4): 0.5 mg via RESPIRATORY_TRACT
  Filled 2017-09-16 (×4): qty 2

## 2017-09-16 MED ORDER — INFLUENZA VAC SPLIT QUAD 0.5 ML IM SUSY
0.5000 mL | PREFILLED_SYRINGE | INTRAMUSCULAR | Status: AC
  Administered 2017-09-17: 0.5 mL via INTRAMUSCULAR
  Filled 2017-09-16: qty 0.5

## 2017-09-16 MED ORDER — PNEUMOCOCCAL VAC POLYVALENT 25 MCG/0.5ML IJ INJ
0.5000 mL | INJECTION | INTRAMUSCULAR | Status: AC
  Administered 2017-09-17: 0.5 mL via INTRAMUSCULAR
  Filled 2017-09-16: qty 0.5

## 2017-09-16 MED ORDER — CLONIDINE HCL 0.1 MG PO TABS
0.3000 mg | ORAL_TABLET | Freq: Two times a day (BID) | ORAL | Status: DC
  Administered 2017-09-16 – 2017-09-18 (×4): 0.3 mg via ORAL
  Filled 2017-09-16 (×4): qty 3

## 2017-09-16 MED ORDER — METFORMIN HCL 500 MG PO TABS: 1000.0000 mg | ORAL_TABLET | Freq: Every day | ORAL | Status: DC

## 2017-09-16 MED ORDER — IPRATROPIUM-ALBUTEROL 0.5-2.5 (3) MG/3ML IN SOLN
3.0000 mL | Freq: Once | RESPIRATORY_TRACT | Status: AC
  Administered 2017-09-16: 3 mL via RESPIRATORY_TRACT
  Filled 2017-09-16: qty 3

## 2017-09-16 MED ORDER — NICOTINE 14 MG/24HR TD PT24
14.0000 mg | MEDICATED_PATCH | Freq: Every day | TRANSDERMAL | Status: DC
  Administered 2017-09-16 – 2017-09-17 (×2): 14 mg via TRANSDERMAL
  Filled 2017-09-16 (×3): qty 1

## 2017-09-16 MED ORDER — ACETAMINOPHEN 325 MG PO TABS: 650.0000 mg | ORAL_TABLET | Freq: Four times a day (QID) | ORAL | Status: DC | PRN

## 2017-09-16 MED ORDER — TRIAMTERENE-HCTZ 37.5-25 MG PO TABS
2.0000 | ORAL_TABLET | Freq: Every day | ORAL | Status: DC
  Administered 2017-09-17: 2 via ORAL
  Filled 2017-09-16 (×2): qty 2

## 2017-09-16 MED ORDER — DIPHENHYDRAMINE HCL 25 MG PO CAPS
25.0000 mg | ORAL_CAPSULE | Freq: Every day | ORAL | Status: DC
  Administered 2017-09-16 – 2017-09-17 (×2): 25 mg via ORAL
  Filled 2017-09-16 (×2): qty 1

## 2017-09-16 MED ORDER — INSULIN ASPART 100 UNIT/ML ~~LOC~~ SOLN
0.0000 [IU] | Freq: Three times a day (TID) | SUBCUTANEOUS | Status: DC
  Administered 2017-09-16 – 2017-09-17 (×2): 5 [IU] via SUBCUTANEOUS
  Administered 2017-09-17: 3 [IU] via SUBCUTANEOUS
  Administered 2017-09-17: 2 [IU] via SUBCUTANEOUS
  Administered 2017-09-18: 1 [IU] via SUBCUTANEOUS
  Filled 2017-09-16 (×5): qty 1

## 2017-09-16 MED ORDER — AMLODIPINE BESYLATE 5 MG PO TABS
10.0000 mg | ORAL_TABLET | Freq: Every day | ORAL | Status: DC
  Administered 2017-09-17: 10 mg via ORAL
  Filled 2017-09-16: qty 2

## 2017-09-16 MED ORDER — INSULIN ASPART 100 UNIT/ML ~~LOC~~ SOLN
0.0000 [IU] | Freq: Every day | SUBCUTANEOUS | Status: DC
  Administered 2017-09-16: 4 [IU] via SUBCUTANEOUS
  Filled 2017-09-16: qty 1

## 2017-09-16 MED ORDER — IPRATROPIUM-ALBUTEROL 0.5-2.5 (3) MG/3ML IN SOLN
3.0000 mL | Freq: Four times a day (QID) | RESPIRATORY_TRACT | Status: DC
  Administered 2017-09-16 – 2017-09-18 (×7): 3 mL via RESPIRATORY_TRACT
  Filled 2017-09-16 (×7): qty 3

## 2017-09-16 MED ORDER — ATORVASTATIN CALCIUM 20 MG PO TABS
40.0000 mg | ORAL_TABLET | Freq: Every day | ORAL | Status: DC
  Administered 2017-09-16 – 2017-09-17 (×2): 40 mg via ORAL
  Filled 2017-09-16 (×2): qty 2

## 2017-09-16 MED ORDER — NITROGLYCERIN 0.4 MG SL SUBL
0.4000 mg | SUBLINGUAL_TABLET | SUBLINGUAL | Status: DC | PRN
  Administered 2017-09-16 (×3): 0.4 mg via SUBLINGUAL
  Filled 2017-09-16: qty 1

## 2017-09-16 MED ORDER — VITAMIN D 1000 UNITS PO TABS
5000.0000 [IU] | ORAL_TABLET | Freq: Every day | ORAL | Status: DC
  Administered 2017-09-17 – 2017-09-18 (×2): 5000 [IU] via ORAL
  Filled 2017-09-16 (×2): qty 5

## 2017-09-16 MED ORDER — ACETAMINOPHEN 650 MG RE SUPP: 650.0000 mg | Freq: Four times a day (QID) | RECTAL | Status: DC | PRN

## 2017-09-16 NOTE — ED Notes (Signed)
Pt has been given food and a drink at this time. Pt in room, in NAD and denies SOB or pain at this time.

## 2017-09-16 NOTE — H&P (Signed)
[facility] [place] [facility]   PATIENT NAME: Paul ParcelDennis Hernandez    MR#:  [id number]  DATE OF BIRTH:  Apr 20, 1966  DATE OF ADMISSION:  09/16/2017  PRIMARY CARE PHYSICIAN: Anselm JunglingHatchett, Mary, NP   REQUESTING/REFERRING PHYSICIAN: Dr Willy EddyPatrick Robinson  CHIEF COMPLAINT:   Chief Complaint  Patient presents with  . Hypertension    HISTORY OF PRESENT ILLNESS:  Paul Hernandez  is a 51 y.o. male with a known history of hypertension.  He presents to the ER with elevated blood pressure.  Some shortness of breath.  Was found to have an elevated troponin.  The patient does not complain of chest pain.  The patient does have occasional wheezing and shortness of breath.  The patient is not the best historian.  He states his blood pressure does come down when he is not nervous.  PAST MEDICAL HISTORY:   Past Medical History:  Diagnosis Date  . Diabetes mellitus without complication (HCC)   . Hypertension     PAST SURGICAL HISTORY:   Past Surgical History:  Procedure Laterality Date  . NO PAST SURGERIES      SOCIAL HISTORY:   Social History   Tobacco Use  . Smoking status: Current Some Day Smoker    Packs/day: 0.50  . Smokeless tobacco: Current User  Substance Use Topics  . Alcohol use: No    FAMILY HISTORY:   Family History  Problem Relation Age of Onset  . CVA Mother   . CAD Father     DRUG ALLERGIES:   Allergies  Allergen Reactions  . Asa [Aspirin]     REVIEW OF SYSTEMS:  CONSTITUTIONAL: No fever, fatigue or weakness.  EYES: No blurred or double vision.  EARS, NOSE, AND THROAT: No tinnitus or ear pain. No sore throat RESPIRATORY: No cough.  Some shortness of breath.  Occasional wheezing.  No hemoptysis.  CARDIOVASCULAR: No chest pain, orthopnea, edema.  GASTROINTESTINAL: No nausea, vomiting, diarrhea or abdominal pain. No blood in bowel movements GENITOURINARY: No dysuria, hematuria.  ENDOCRINE: No polyuria, nocturia,  HEMATOLOGY: No  anemia, easy bruising or bleeding SKIN: No rash or lesion. MUSCULOSKELETAL: No joint pain or arthritis.   NEUROLOGIC: No tingling, numbness, weakness.  PSYCHIATRY: No anxiety or depression.   MEDICATIONS AT HOME:   Prior to Admission medications   Medication Sig Start Date End Date Taking? Authorizing Provider  albuterol (PROVENTIL HFA;VENTOLIN HFA) 108 (90 Base) MCG/ACT inhaler Inhale 2 puffs into the lungs every 6 (six) hours as needed for wheezing or shortness of breath. 08/13/17  Yes Bridget HartshornSummers, Rhonda L, PA-C  amLODipine-benazepril (LOTREL) 10-20 MG capsule Take 1 capsule by mouth every evening.    Yes [provider]  atorvastatin (LIPITOR) 40 MG tablet Take 40 mg by mouth at bedtime.    Yes [provider]  benztropine (COGENTIN) 2 MG tablet Take 2 mg by mouth at bedtime.   Yes [provider]  Cholecalciferol (VITAMIN D3) 5000 units CAPS Take 5,000 Units by mouth daily.   Yes [provider]  cloNIDine (CATAPRES) 0.3 MG tablet Take 0.3 mg by mouth 2 (two) times daily.   Yes [provider]  diphenhydrAMINE (BENADRYL) 25 mg capsule Take 25 mg by mouth at bedtime.    Yes [provider]  metFORMIN (GLUCOPHAGE) 500 MG tablet Take 1,000-1,500 mg by mouth daily with breakfast. Take 1500 mg in the morning and 500 mg at bedtime.   Yes [provider]  metoprolol (TOPROL-XL) 200 MG 24 hr tablet  Take 400 mg by mouth daily.    Yes [provider]  triamterene-hydrochlorothiazide (MAXZIDE) 75-50 MG tablet Take 1 capsule by mouth daily.   Yes [provider]  amLODipine (NORVASC) 10 MG tablet Take 1 tablet (10 mg total) by mouth daily. Patient not taking: Reported on 09/16/2017 08/10/16 08/10/17  Minna AntisPaduchowski, Kevin, MD  predniSONE (DELTASONE) 10 MG tablet Take 5 tablets once a day x 2 days Patient not taking: Reported on 09/16/2017 08/13/17   Tommi RumpsSummers, Rhonda L, PA-C      VITAL SIGNS:  Blood pressure 139/82, pulse  (!) 107, temperature 98.6 F (37 C), temperature source Oral, resp. rate (!) 31, height 6' (1.829 m), weight 106.1 kg (234 lb), SpO2 94 %.  PHYSICAL EXAMINATION:  GENERAL:  51 y.o.-year-old patient lying in the bed with no acute distress.  Patient does shallow breathing. EYES: Pupils equal, round, reactive to light and accommodation. No scleral icterus. Extraocular muscles intact.  HEENT: Head atraumatic, normocephalic. Oropharynx and nasopharynx clear.  NECK:  Supple, no jugular venous distention. No thyroid enlargement, no tenderness.  LUNGS: Decreased breath sounds bilaterally, poor air entry bilaterally, positive expiratory wheezing.  no rales,rhonchi or crepitation. No use of accessory muscles of respiration.  CARDIOVASCULAR: S1, S2 normal. No murmurs, rubs, or gallops.  ABDOMEN: Soft, nontender, nondistended. Bowel sounds present. No organomegaly or mass.  EXTREMITIES: Trace edema, no cyanosis, or clubbing.  NEUROLOGIC: Cranial nerves II through XII are intact. Muscle strength 5/5 in all extremities. Sensation intact. Gait not checked.  PSYCHIATRIC: The patient is alert and oriented x 3.  SKIN: No rash, lesion, or ulcer.   LABORATORY PANEL:   CBC Recent Labs  Lab 09/16/17 1034  WBC 6.5  HGB 14.8  HCT 45.9  PLT 319   ------------------------------------------------------------------------------------------------------------------  Chemistries  Recent Labs  Lab 09/16/17 1034  NA 138  K 3.8  CL 100*  CO2 27  GLUCOSE 226*  BUN 15  CREATININE 0.97  CALCIUM 9.0   ------------------------------------------------------------------------------------------------------------------  Cardiac Enzymes Recent Labs  Lab 09/16/17 1330  TROPONINI 0.06*   ------------------------------------------------------------------------------------------------------------------  RADIOLOGY:  Dg Chest 2 View  Result Date: 09/16/2017 CLINICAL DATA:  Hypertension and shortness of breath  today. EXAM: CHEST  2 VIEW COMPARISON:  PA and lateral chest 08/13/2017 and 08/10/2016. FINDINGS: There is cardiomegaly without edema. No pneumothorax or pleural effusion. No acute bony abnormality. IMPRESSION: Cardiomegaly without acute disease. Electronically Signed   By: Drusilla Kannerhomas  Dalessio M.D.   On: 09/16/2017 13:05    EKG:   Sinus tachycardia 107 bpm, left atrial enlargement, laterally flipped T waves  IMPRESSION AND PLAN:   1.  COPD exacerbation.  Continue Solu-Medrol 40 mg IV daily.  DuoNeb nebulizer solution and budesonide nebulizers. 2.  Accelerated hypertension.  Blood pressure came down with 1 dose of Norvasc.  Continue his usual medications except for decreasing Toprol down to 200 mg daily.  Hopefully can titrate back on the clonidine. 3.  Type 2 diabetes mellitus.  Check a hemoglobin A1c.  Put on sliding scale.  Increase metformin to thousand milligrams twice daily 4.  Tobacco abuse.  Smoking cessation counseling done 4 minutes by me.  Refused nicotine patch. 5.  Elevated troponin likely secondary to either accelerated hypertension and/or COPD exacerbation.  Get a third troponin.  Give aspirin.  Monitor on telemetry.  Obtain echocardiogram.  All the records are reviewed and case discussed with ED provider. Management plans discussed with the patient, family and they are in agreement.  CODE STATUS: Full code  TOTAL TIME TAKING CARE OF THIS PATIENT: 50 minutes.    Alford Highland M.D on 09/16/2017 at 3:39 PM  Between 7am to 6pm - Pager - [phone]  After 6pm call admission pager [phone]  [facility] Office  [phone]  CC: Primary care physician; Anselm Jungling, NP

## 2017-09-16 NOTE — ED Notes (Signed)
Patient transported to X-ray 

## 2017-09-16 NOTE — ED Triage Notes (Signed)
Patient states occasional ShOB

## 2017-09-16 NOTE — ED Notes (Signed)
RN called group home owner back and updated that pt will be admitted and not in need of a ride home.

## 2017-09-16 NOTE — ED Triage Notes (Signed)
Patient arrives from [facility] with c/o hypertension. Pt states he is compliant with his medications. Denies lightheadedness, dizziness, chest pain, n/v.

## 2017-09-16 NOTE — ED Notes (Signed)
RN called lab and was reassured they had enough blood to add on BNP. Lab running test at this time.

## 2017-09-16 NOTE — ED Provider Notes (Signed)
[facility] Emergency Department Provider Note    None    (approximate)  I have reviewed the triage vital signs and the nursing notes.   HISTORY  Chief Complaint Hypertension    HPI Paul Hernandez is a 51 y.o. male Street diabetes hypertension and concern for elevated blood pressure.  Patient coming from group home where he lives.  States he has been compliant with his medications.  States he has had intermittent shortness of breath and states he smokes roughly 1 pack/day.  Denies any chest pain right now but has had intermittent chest pains over the past several days.  States he every now and then will feel very short of breath.  Denies any personal history of heart attack.  States that they checked his blood pressure this morning and he came to the ER due to it being elevated 160s over 130s.  Denies any street drugs.  Past Medical History:  Diagnosis Date  . Diabetes mellitus without complication (HCC)   . Hypertension    Family History  Problem Relation Age of Onset  . CVA Mother   . CAD Father    Past Surgical History:  Procedure Laterality Date  . NO PAST SURGERIES     Patient Active Problem List   Diagnosis Date Noted  . COPD exacerbation (HCC) 09/16/2017      Prior to Admission medications   Medication Sig Start Date End Date Taking? Authorizing Provider  albuterol (PROVENTIL HFA;VENTOLIN HFA) 108 (90 Base) MCG/ACT inhaler Inhale 2 puffs into the lungs every 6 (six) hours as needed for wheezing or shortness of breath. 08/13/17  Yes Bridget Hartshorn L, PA-C  amLODipine-benazepril (LOTREL) 10-20 MG capsule Take 1 capsule by mouth every evening.    Yes [provider]  atorvastatin (LIPITOR) 40 MG tablet Take 40 mg by mouth at bedtime.    Yes [provider]  benztropine (COGENTIN) 2 MG tablet Take 2 mg by mouth at bedtime.   Yes [provider]  Cholecalciferol (VITAMIN D3) 5000 units CAPS Take 5,000 Units by mouth  daily.   Yes [provider]  cloNIDine (CATAPRES) 0.3 MG tablet Take 0.3 mg by mouth 2 (two) times daily.   Yes [provider]  diphenhydrAMINE (BENADRYL) 25 mg capsule Take 25 mg by mouth at bedtime.    Yes [provider]  metFORMIN (GLUCOPHAGE) 500 MG tablet Take 1,000-1,500 mg by mouth daily with breakfast. Take 1500 mg in the morning and 500 mg at bedtime.   Yes [provider]  metoprolol (TOPROL-XL) 200 MG 24 hr tablet Take 400 mg by mouth daily.    Yes [provider]  triamterene-hydrochlorothiazide (MAXZIDE) 75-50 MG tablet Take 1 capsule by mouth daily.   Yes [provider]  amLODipine (NORVASC) 10 MG tablet Take 1 tablet (10 mg total) by mouth daily. Patient not taking: Reported on 09/16/2017 08/10/16 08/10/17  Minna Antis, MD  predniSONE (DELTASONE) 10 MG tablet Take 5 tablets once a day x 2 days Patient not taking: Reported on 09/16/2017 08/13/17   Tommi Rumps, PA-C    Allergies [name] [aspirin]    Social History Social History   Tobacco Use  . Smoking status: Current Some Day Smoker    Packs/day: 0.50  . Smokeless tobacco: Current User  Substance Use Topics  . Alcohol use: No  . Drug use: No    Review of Systems Patient denies headaches, rhinorrhea, blurry vision, numbness, shortness of breath, chest pain, edema, cough,  abdominal pain, nausea, vomiting, diarrhea, dysuria, fevers, rashes or hallucinations unless otherwise stated above in HPI. ____________________________________________   PHYSICAL EXAM:  VITAL SIGNS: Vitals:   09/16/17 1556 09/16/17 1652  BP: (!) 149/112 (!) 161/124  Pulse: (!) 105 (!) 111  Resp: (!) 29 (!) 22  Temp:  98.6 F (37 C)  SpO2: 91% 97%    Constitutional: Alert and oriented. Sob appearing but in no acute distress. Eyes: Conjunctivae are normal.  Head: Atraumatic. Nose: No congestion/rhinnorhea. Mouth/Throat: Mucous membranes are moist.   Neck: No stridor.  Painless ROM.  Cardiovascular: Normal rate, regular rhythm. Grossly normal heart sounds.  Good peripheral circulation. Respiratory: tachypnea with some use of accessory muscles.  No retractions. With diffuse wheeze, diminished breathsounds in posterior bases Gastrointestinal: Soft and nontender. No distention. No abdominal bruits. No CVA tenderness. Musculoskeletal: No lower extremity tenderness nor edema.  No joint effusions. Neurologic:  Normal speech and language. No gross focal neurologic deficits are appreciated. No facial droop Skin:  Skin is warm, dry and intact. No rash noted. Psychiatric: Mood and affect are normal. Speech and behavior are normal.  ____________________________________________   LABS (all labs ordered are listed, but only abnormal results are displayed)  Results for orders placed or performed during the hospital encounter of 09/16/17 (from the past 24 hour(s))  CBC     Status: None   Collection Time: 09/16/17 10:34 AM  Result Value Ref Range   WBC 6.5 3.8 - 10.6 K/uL   RBC 5.12 4.40 - 5.90 MIL/uL   Hemoglobin 14.8 13.0 - 18.0 g/dL   HCT 16.1 09.6 - 04.5 %   MCV 89.8 80.0 - 100.0 fL   MCH 29.0 26.0 - 34.0 pg   MCHC 32.3 32.0 - 36.0 g/dL   RDW 40.9 81.1 - 91.4 %   Platelets 319 150 - 440 K/uL  Basic metabolic panel     Status: Abnormal   Collection Time: 09/16/17 10:34 AM  Result Value Ref Range   Sodium 138 135 - 145 mmol/L   Potassium 3.8 3.5 - 5.1 mmol/L   Chloride 100 (L) 101 - 111 mmol/L   CO2 27 22 - 32 mmol/L   Glucose, Bld 226 (H) 65 - 99 mg/dL   BUN 15 6 - 20 mg/dL   Creatinine, Ser 7.82 0.61 - 1.24 mg/dL   Calcium 9.0 8.9 - 95.6 mg/dL   GFR calc non Af Amer >60 >60 mL/min   GFR calc Af Amer >60 >60 mL/min   Anion gap 11 5 - 15  Troponin I     Status: Abnormal   Collection Time: 09/16/17 10:34 AM  Result Value Ref Range   Troponin I 0.05 (HH) <0.03 ng/mL  Brain natriuretic peptide     Status: Abnormal   Collection Time: 09/16/17 10:34 AM    Result Value Ref Range   B Natriuretic Peptide 680.0 (H) 0.0 - 100.0 pg/mL  Urine Drug Screen, Qualitative ([facility] only)     Status: None   Collection Time: 09/16/17  1:30 PM  Result Value Ref Range   Tricyclic, Ur Screen NONE DETECTED NONE DETECTED   Amphetamines, Ur Screen NONE DETECTED NONE DETECTED   MDMA (Ecstasy)Ur Screen NONE DETECTED NONE DETECTED   Cocaine Metabolite,Ur [place] NONE DETECTED NONE DETECTED   Opiate, Ur Screen NONE DETECTED NONE DETECTED   Phencyclidine (PCP) Ur S NONE DETECTED NONE DETECTED   Cannabinoid 50 Ng, Ur [place] NONE DETECTED NONE DETECTED   Barbiturates, Ur Screen NONE DETECTED NONE DETECTED  Benzodiazepine, Ur Scrn NONE DETECTED NONE DETECTED   Methadone Scn, Ur NONE DETECTED NONE DETECTED  Troponin I     Status: Abnormal   Collection Time: 09/16/17  1:30 PM  Result Value Ref Range   Troponin I 0.06 (HH) <0.03 ng/mL  Troponin I     Status: Abnormal   Collection Time: 09/16/17  5:05 PM  Result Value Ref Range   Troponin I 0.05 (HH) <0.03 ng/mL  Glucose, capillary     Status: Abnormal   Collection Time: 09/16/17  5:26 PM  Result Value Ref Range   Glucose-Capillary 254 (H) 65 - 99 mg/dL   ____________________________________________  EKG My review and personal interpretation at Time: 10:38   Indication: htn  Rate: 100  Rhythm: sius Axis: normal Other: nonspecific ivcd, inferolateral t wave changes not significantly different from previous 08/10/17 ____________________________________________  RADIOLOGY  I personally reviewed all radiographic images ordered to evaluate for the above acute complaints and reviewed radiology reports and findings.  These findings were personally discussed with the patient.  Please see medical record for radiology report.  ____________________________________________   PROCEDURES  Procedure(s) performed:  Procedures    Critical Care performed: no ____________________________________________   INITIAL IMPRESSION  / ASSESSMENT AND PLAN / ED COURSE  Pertinent labs & imaging results that were available during my care of the patient were reviewed by me and considered in my medical decision making (see chart for details).  DDX: Asthma, copd, CHF, pna, acs, ptx, malignancy, Pe, anemia   Durward ParcelDennis Peretz is a 51 y.o. who presents to the ED with elevated blood pressure and shortness of breath as described above.  Did have episode of chest pain briefly yesterday but none right now.  Is not any respiratory distress but does appear short of breath.  Does have significant risk factors for cardiac disease.  EKG shows some nonspecific inferior T wave changes but no ST elevations or depressions.  Initial troponin is mildly elevated to 0.05.  Chest x-ray with borderline cardiomegaly.  This may be secondary to long-standing congestive heart failure though he does not carry this diagnosis and I do not see any previously performed echocardiograms.  May also be secondary to dyspnea in the setting of COPD.  Will give nebulizer treatment then to continue to observe the patient for repeat enzyme.  He is otherwise well-appearing at this time.  Clinical Course as of Sep 17 1939  Wynelle LinkSun Sep 16, 2017  1446 Patient reassessed.  Some improvement after nebulizer treatment but still tachypneic.  Troponin slightly increased to 0.06.  Based on his lack of primary care elevated troponin and dyspnea I do believe that is in the patient's best interest to be observed in the hospital for further medical management.  Patient was given IV Lasix for possible component of congestive heart failure.  Blood pressure is improving.  We will continue with nebulizer treatments.  Still does not have any active chest pain at this time but certainly would be at increased risk for ischemic disease.  Will continue to monitor.  [PR]    Clinical Course User Index [PR] Willy Eddyobinson, Delford Wingert, MD     ____________________________________________   FINAL CLINICAL  IMPRESSION(S) / ED DIAGNOSES  Final diagnoses:  Hypertensive urgency  COPD exacerbation (HCC)  Elevated troponin I level      NEW MEDICATIONS STARTED DURING THIS VISIT:  This SmartLink is deprecated. Use AVSMEDLIST instead to display the medication list for a patient.   Note:  This document was prepared using Dragon  voice recognition software and may include unintentional dictation errors.    Willy Eddyobinson, Kaleen Rochette, MD 09/16/17 [phone]

## 2017-09-16 NOTE — ED Notes (Signed)
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 Contact at [phone].

## 2017-09-17 ENCOUNTER — Inpatient Hospital Stay
Admit: 2017-09-17 | Discharge: 2017-09-17 | Disposition: A | Discharge status: Home / Self Care | Payer: Medicare Other | Attending: Internal Medicine | Admitting: Internal Medicine

## 2017-09-17 LAB — GLUCOSE, CAPILLARY
Glucose-Capillary: 158 mg/dL — ABNORMAL HIGH (ref 65–99)
Glucose-Capillary: 279 mg/dL — ABNORMAL HIGH (ref 65–99)
Glucose-Capillary: 215 mg/dL — ABNORMAL HIGH (ref 65–99)
Glucose-Capillary: 167 mg/dL — ABNORMAL HIGH (ref 65–99)

## 2017-09-17 LAB — BASIC METABOLIC PANEL
Anion gap: 7 (ref 5–15)
BUN: 16 mg/dL (ref 6–20)
CHLORIDE: 101 mmol/L (ref 101–111)
CO2: 28 mmol/L (ref 22–32)
CREATININE: 0.9 mg/dL (ref 0.61–1.24)
Calcium: 9 mg/dL (ref 8.9–10.3)
GFR calc Af Amer: >60 mL/min (ref >60)
GFR calc non Af Amer: >60 mL/min (ref >60)
GLUCOSE: 146 mg/dL — AB (ref 65–99)
POTASSIUM: 3.9 mmol/L (ref 3.5–5.1)
Sodium: 136 mmol/L (ref 135–145)

## 2017-09-17 LAB — TROPONIN I
Troponin I: 0.04 ng/mL (ref <0.03)
Troponin I: 0.03 ng/mL (ref <0.03)
Troponin I: 0.04 ng/mL (ref <0.03)

## 2017-09-17 LAB — CBC
HEMATOCRIT: 41.9 % (ref 40.0–52.0)
Hemoglobin: 14 g/dL (ref 13.0–18.0)
MCH: 29.9 pg (ref 26.0–34.0)
MCHC: 33.4 g/dL (ref 32.0–36.0)
MCV: 89.6 fL (ref 80.0–100.0)
PLATELETS: 303 10*3/uL (ref 150–440)
RBC: 4.67 MIL/uL (ref 4.40–5.90)
RDW: 14.1 % (ref 11.5–14.5)
WBC: 7.3 10*3/uL (ref 3.8–10.6)

## 2017-09-17 LAB — LIPID PANEL
Cholesterol: 219 mg/dL — ABNORMAL HIGH (ref 0–200)
HDL: 45 mg/dL (ref >40)
LDL CALC: 163 mg/dL — AB (ref 0–99)
Total CHOL/HDL Ratio: 4.9 RATIO
Triglycerides: 56 mg/dL (ref <150)
VLDL: 11 mg/dL (ref 0–40)

## 2017-09-17 LAB — ECHOCARDIOGRAM COMPLETE
Height: 72 in
WEIGHTICAEL: 3744 [oz_av]

## 2017-09-17 MED ORDER — INSULIN ASPART 100 UNIT/ML ~~LOC~~ SOLN
5.0000 [IU] | Freq: Three times a day (TID) | SUBCUTANEOUS | Status: DC
  Administered 2017-09-17 – 2017-09-18 (×2): 5 [IU] via SUBCUTANEOUS
  Filled 2017-09-17 (×2): qty 1

## 2017-09-17 NOTE — Plan of Care (Signed)
  Progressing Education: Knowledge of General Education information will improve 09/17/2017 1234 - Progressing by Tomie ChinaJackson, Luv Mish Cecelie, RN Health Behavior/Discharge Planning: Ability to manage health-related needs will improve 09/17/2017 1234 - Progressing by Tomie ChinaJackson, Doneen Ollinger Cecelie, RN Clinical Measurements: Ability to maintain clinical measurements within normal limits will improve 09/17/2017 1234 - Progressing by Tomie ChinaJackson, Kameryn Davern Cecelie, RN Will remain free from infection 09/17/2017 1234 - Progressing by Tomie ChinaJackson, Karlon Schlafer Cecelie, RN Diagnostic test results will improve 09/17/2017 1234 - Progressing by Tomie ChinaJackson, Mahlet Jergens Cecelie, RN Respiratory complications will improve 09/17/2017 1234 - Progressing by Tomie ChinaJackson, Hortencia Martire Cecelie, RN Cardiovascular complication will be avoided 09/17/2017 1234 - Progressing by Tomie ChinaJackson, Neev Mcmains Cecelie, RN Activity: Risk for activity intolerance will decrease 09/17/2017 1234 - Progressing by Tomie ChinaJackson, Zamarah Ullmer Cecelie, RN Nutrition: Adequate nutrition will be maintained 09/17/2017 1234 - Progressing by Tomie ChinaJackson, Mikinzie Maciejewski Cecelie, RN Coping: Level of anxiety will decrease 09/17/2017 1234 - Progressing by Tomie ChinaJackson, Jonaven Hilgers Cecelie, RN Elimination: Will not experience complications related to bowel motility 09/17/2017 1234 - Progressing by Tomie ChinaJackson, Gray Maugeri Cecelie, RN Will not experience complications related to urinary retention 09/17/2017 1234 - Progressing by Tomie ChinaJackson, Sahmir Weatherbee Cecelie, RN Pain Managment: General experience of comfort will improve 09/17/2017 1234 - Progressing by Tomie ChinaJackson, Sabrinna Yearwood Cecelie, RN Safety: Ability to remain free from injury will improve 09/17/2017 1234 - Progressing by Tomie ChinaJackson, Cleta Heatley Cecelie, RN Skin Integrity: Risk for impaired skin integrity will decrease 09/17/2017 1234 - Progressing by Tomie ChinaJackson, Tavonte Seybold Cecelie, RN

## 2017-09-17 NOTE — Progress Notes (Signed)
DR Sherryll BurgerShah was informed of pt having 7 beat of v- tach , see new order , will continue to monitor

## 2017-09-17 NOTE — Progress Notes (Signed)
1        [employer] [facility] [place] [facility]   PATIENT NAME: Paul Hernandez    MR#:  [id number]  DATE OF BIRTH:  22-Aug-1966  SUBJECTIVE:  CHIEF COMPLAINT:   Chief Complaint  Patient presents with  . Hypertension  No complaints REVIEW OF SYSTEMS:  Review of Systems  Constitutional: Negative for chills, fever and weight loss.  HENT: Negative for nosebleeds and sore throat.   Eyes: Negative for blurred vision.  Respiratory: Negative for cough, shortness of breath and wheezing.   Cardiovascular: Negative for chest pain, orthopnea, leg swelling and PND.  Gastrointestinal: Negative for abdominal pain, constipation, diarrhea, heartburn, nausea and vomiting.  Genitourinary: Negative for dysuria and urgency.  Musculoskeletal: Negative for back pain.  Skin: Negative for rash.  Neurological: Negative for dizziness, speech change, focal weakness and headaches.  Endo/Heme/Allergies: Does not bruise/bleed easily.  Psychiatric/Behavioral: Negative for depression.    DRUG ALLERGIES:   Allergies  Allergen Reactions  . Asa [Aspirin]    VITALS:  Blood pressure (!) 128/93, pulse (!) 102, temperature 97.7 F (36.5 C), temperature source Oral, resp. rate 18, height 6' (1.829 m), weight 106.1 kg (234 lb), SpO2 98 %. PHYSICAL EXAMINATION:  Physical Exam  Constitutional: He is oriented to person, place, and time and well-developed, well-nourished, and in no distress.  HENT:  Head: Normocephalic and atraumatic.  Eyes: Conjunctivae and EOM are normal. Pupils are equal, round, and reactive to light.  Neck: Normal range of motion. Neck supple. No tracheal deviation present. No thyromegaly present.  Cardiovascular: Normal rate, regular rhythm and normal heart sounds.  Pulmonary/Chest: Effort normal and breath sounds normal. No respiratory distress. He has no wheezes. He exhibits no tenderness.  Abdominal: Soft. Bowel sounds are normal. He exhibits no distension. There is no  tenderness.  Musculoskeletal: Normal range of motion.  Neurological: He is alert and oriented to person, place, and time. No cranial nerve deficit.  Skin: Skin is warm and dry. No rash noted.  Psychiatric: Mood and affect normal.   LABORATORY PANEL:  Male CBC Recent Labs  Lab 09/17/17 0557  WBC 7.3  HGB 14.0  HCT 41.9  PLT 303   ------------------------------------------------------------------------------------------------------------------ Chemistries  Recent Labs  Lab 09/17/17 0557  NA 136  K 3.9  CL 101  CO2 28  GLUCOSE 146*  BUN 16  CREATININE 0.90  CALCIUM 9.0   RADIOLOGY:  No results found. ASSESSMENT AND PLAN:  51 y.o. male with a known history of hypertension admitted for elevated blood pressure.  Some shortness of breath.  Was found to have an elevated troponin  1.  COPD exacerbation.  Continue Solu-Medrol 40 mg IV daily.  DuoNeb nebulizer solution and budesonide nebulizers. 2.  Accelerated hypertension: Blood pressure much better.  Continue amlodipine, Lotensin, Catapres, Toprol, Max Zide. 3.  Type 2 diabetes mellitus: continue sliding scale.  Increase metformin to thousand milligrams twice daily, NovoLog 5 units subcutaneous 3 times daily with each meals 4.  Tobacco abuse.  Smoking cessation counseling done 4 minutes by Dr Renae GlossWieting.  Refused nicotine patch. 5.  Elevated troponin likely secondary to either accelerated hypertension and/or COPD exacerbation: Continue aspirin.  Monitor on telemetry, appreciate cardiology input       All the records are reviewed and case discussed with Care Management/Social Worker. Management plans discussed with the patient, nursing and they are in agreement.  CODE STATUS: Full Code  TOTAL TIME TAKING CARE OF THIS PATIENT: 25 minutes.   More than 50%  of the time was spent in counseling/coordination of care: YES  POSSIBLE D/C IN 1 DAYS, DEPENDING ON CLINICAL CONDITION.   Delfino LovettVipul Webb Weed M.D on 09/17/2017 at 4:16  PM  Between 7am to 6pm - Pager - [phone]  After 6pm go to [email or web address] - Social research officer, governmentpassword [email or web address]  [facility] [facility]  Office  [phone]  CC: Primary care physician; Anselm JunglingHatchett, Mary, NP  Note: This dictation was prepared with Dragon dictation along with smaller phrase technology. Any transcriptional errors that result from this process are unintentional.

## 2017-09-17 NOTE — Progress Notes (Signed)
*  PRELIMINARY RESULTS* Echocardiogram 2D Echocardiogram has been performed.  Paul Hernandez S Paul Hernandez 09/17/2017, 9:31 PM 

## 2017-09-17 NOTE — Consult Note (Signed)
Reason for Consult: Elevated troponin poorly controlled blood pressure possible nonsustained VT Referring Physician: Dr. Carlynn Spry hospitalist  Paul Hernandez is an 51 y.o. male.  HPI: Patient is a 52 year old black male with history of schizophrenia lives in a group home report of trouble with his blood pressure being controlled he had his blood pressure checked at home and was elevated so the symptoms are more.  Patient denies significant chest pain has had some minimal shortness of breath no buccal spells or syncope.  Patient has been compliant with his psychiatric medications history of smoking but does not exercise  Past Medical History:  Diagnosis Date  . Diabetes mellitus without complication ([place])   . Hypertension     Past Surgical History:  Procedure Laterality Date  . NO PAST SURGERIES      Family History  Problem Relation Age of Onset  . CVA Mother   . CAD Father     Social History:  reports that he has been smoking.  He has been smoking about 0.50 packs per day. He uses smokeless tobacco. He reports that he does not drink alcohol or use drugs.  Allergies:  Allergies  Allergen Reactions  . Asa [Aspirin]     Medications: I have reviewed the patient's current medications.  Results for orders placed or performed during the hospital encounter of 09/16/17 (from the past 48 hour(s))  CBC     Status: None   Collection Time: 09/16/17 10:34 AM  Result Value Ref Range   WBC 6.5 3.8 - 10.6 K/uL   RBC 5.12 4.40 - 5.90 MIL/uL   Hemoglobin 14.8 13.0 - 18.0 g/dL   HCT 45.9 40.0 - 52.0 %   MCV 89.8 80.0 - 100.0 fL   MCH 29.0 26.0 - 34.0 pg   MCHC 32.3 32.0 - 36.0 g/dL   RDW 14.0 11.5 - 14.5 %   Platelets 319 150 - 440 K/uL  Basic metabolic panel     Status: Abnormal   Collection Time: 09/16/17 10:34 AM  Result Value Ref Range   Sodium 138 135 - 145 mmol/L   Potassium 3.8 3.5 - 5.1 mmol/L   Chloride 100 (L) 101 - 111 mmol/L   CO2 27 22 - 32 mmol/L   Glucose, Bld 226 (H) 65  - 99 mg/dL   BUN 15 6 - 20 mg/dL   Creatinine, Ser 0.97 0.61 - 1.24 mg/dL   Calcium 9.0 8.9 - 10.3 mg/dL   GFR calc non Af Amer >60 >60 mL/min   GFR calc Af Amer >60 >60 mL/min    Comment: (NOTE) The eGFR has been calculated using the CKD EPI equation. This calculation has not been validated in all clinical situations. eGFR's persistently <60 mL/min signify possible Chronic Kidney Disease.    Anion gap 11 5 - 15  Troponin I     Status: Abnormal   Collection Time: 09/16/17 10:34 AM  Result Value Ref Range   Troponin I 0.05 (HH) <0.03 ng/mL    Comment: CRITICAL RESULT CALLED TO, READ BACK BY AND VERIFIED WITH DONALD SWEENEY @ 1146 09/16/17 [place]   Brain natriuretic peptide     Status: Abnormal   Collection Time: 09/16/17 10:34 AM  Result Value Ref Range   B Natriuretic Peptide 680.0 (H) 0.0 - 100.0 pg/mL  Urine Drug Screen, Qualitative ([facility] only)     Status: None   Collection Time: 09/16/17  1:30 PM  Result Value Ref Range   Tricyclic, Ur Screen NONE DETECTED NONE DETECTED  Amphetamines, Ur Screen NONE DETECTED NONE DETECTED   MDMA (Ecstasy)Ur Screen NONE DETECTED NONE DETECTED   Cocaine Metabolite,Ur [place] NONE DETECTED NONE DETECTED   Opiate, Ur Screen NONE DETECTED NONE DETECTED   Phencyclidine (PCP) Ur S NONE DETECTED NONE DETECTED   Cannabinoid 50 Ng, Ur [place] NONE DETECTED NONE DETECTED   Barbiturates, Ur Screen NONE DETECTED NONE DETECTED   Benzodiazepine, Ur Scrn NONE DETECTED NONE DETECTED   Methadone Scn, Ur NONE DETECTED NONE DETECTED    Comment: (NOTE) 962  Tricyclics, urine               Cutoff 1000 ng/mL 200  Amphetamines, urine             Cutoff 1000 ng/mL 300  MDMA (Ecstasy), urine           Cutoff 500 ng/mL 400  Cocaine Metabolite, urine       Cutoff 300 ng/mL 500  Opiate, urine                   Cutoff 300 ng/mL 600  Phencyclidine (PCP), urine      Cutoff 25 ng/mL 700  Cannabinoid, urine              Cutoff 50 ng/mL 800  Barbiturates, urine             Cutoff  200 ng/mL 900  Benzodiazepine, urine           Cutoff 200 ng/mL 1000 Methadone, urine                Cutoff 300 ng/mL 1100 1200 The urine drug screen provides only a preliminary, unconfirmed 1300 analytical test result and should not be used for non-medical 1400 purposes. Clinical consideration and professional judgment should 1500 be applied to any positive drug screen result due to possible 1600 interfering substances. A more specific alternate chemical method 1700 must be used in order to obtain a confirmed analytical result.  1800 Gas chromato graphy / mass spectrometry (GC/MS) is the preferred 1900 confirmatory method.   Troponin I     Status: Abnormal   Collection Time: 09/16/17  1:30 PM  Result Value Ref Range   Troponin I 0.06 (HH) <0.03 ng/mL    Comment: CRITICAL VALUE NOTED. VALUE IS CONSISTENT WITH PREVIOUSLY REPORTED/CALLED VALUE. [place].  Troponin I     Status: Abnormal   Collection Time: 09/16/17  5:05 PM  Result Value Ref Range   Troponin I 0.05 (HH) <0.03 ng/mL    Comment: CRITICAL VALUE NOTED. VALUE IS CONSISTENT WITH PREVIOUSLY REPORTED/CALLED VALUE. [place].  Glucose, capillary     Status: Abnormal   Collection Time: 09/16/17  5:26 PM  Result Value Ref Range   Glucose-Capillary 254 (H) 65 - 99 mg/dL  Glucose, capillary     Status: Abnormal   Collection Time: 09/16/17  9:49 PM  Result Value Ref Range   Glucose-Capillary 324 (H) 65 - 99 mg/dL  Basic metabolic panel     Status: Abnormal   Collection Time: 09/17/17  5:57 AM  Result Value Ref Range   Sodium 136 135 - 145 mmol/L   Potassium 3.9 3.5 - 5.1 mmol/L   Chloride 101 101 - 111 mmol/L   CO2 28 22 - 32 mmol/L   Glucose, Bld 146 (H) 65 - 99 mg/dL   BUN 16 6 - 20 mg/dL   Creatinine, Ser 0.90 0.61 - 1.24 mg/dL   Calcium 9.0 8.9 - 10.3 mg/dL   GFR calc  non Af Amer >60 >60 mL/min   GFR calc Af Amer >60 >60 mL/min    Comment: (NOTE) The eGFR has been calculated using the CKD EPI equation. This calculation has not  been validated in all clinical situations. eGFR's persistently <60 mL/min signify possible Chronic Kidney Disease.    Anion gap 7 5 - 15  CBC     Status: None   Collection Time: 09/17/17  5:57 AM  Result Value Ref Range   WBC 7.3 3.8 - 10.6 K/uL   RBC 4.67 4.40 - 5.90 MIL/uL   Hemoglobin 14.0 13.0 - 18.0 g/dL   HCT 41.9 40.0 - 52.0 %   MCV 89.6 80.0 - 100.0 fL   MCH 29.9 26.0 - 34.0 pg   MCHC 33.4 32.0 - 36.0 g/dL   RDW 14.1 11.5 - 14.5 %   Platelets 303 150 - 440 K/uL  Lipid panel     Status: Abnormal   Collection Time: 09/17/17  5:57 AM  Result Value Ref Range   Cholesterol 219 (H) 0 - 200 mg/dL   Triglycerides 56 <150 mg/dL   HDL 45 >40 mg/dL   Total CHOL/HDL Ratio 4.9 RATIO   VLDL 11 0 - 40 mg/dL   LDL Cholesterol 163 (H) 0 - 99 mg/dL    Comment:        Total Cholesterol/HDL:CHD Risk Coronary Heart Disease Risk Table                     Men   Women  1/2 Average Risk   3.4   3.3  Average Risk       5.0   4.4  2 X Average Risk   9.6   7.1  3 X Average Risk  23.4   11.0        Use the calculated Patient Ratio above and the CHD Risk Table to determine the patient's CHD Risk.        ATP III CLASSIFICATION (LDL):  <100     mg/dL   Optimal  100-129  mg/dL   Near or Above                    Optimal  130-159  mg/dL   Borderline  160-189  mg/dL   High  >190     mg/dL   Very High   Glucose, capillary     Status: Abnormal   Collection Time: 09/17/17  7:36 AM  Result Value Ref Range   Glucose-Capillary 158 (H) 65 - 99 mg/dL  Troponin I     Status: Abnormal   Collection Time: 09/17/17 11:04 AM  Result Value Ref Range   Troponin I 0.04 (HH) <0.03 ng/mL    Comment: CRITICAL VALUE NOTED. VALUE IS CONSISTENT WITH PREVIOUSLY REPORTED/CALLED VALUE.QSD  Glucose, capillary     Status: Abnormal   Collection Time: 09/17/17 11:46 AM  Result Value Ref Range   Glucose-Capillary 279 (H) 65 - 99 mg/dL    Dg Chest 2 View  Result Date: 09/16/2017 CLINICAL DATA:  Hypertension and  shortness of breath today. EXAM: CHEST  2 VIEW COMPARISON:  PA and lateral chest 08/13/2017 and 08/10/2016. FINDINGS: There is cardiomegaly without edema. No pneumothorax or pleural effusion. No acute bony abnormality. IMPRESSION: Cardiomegaly without acute disease. Electronically Signed   By: Inge Rise M.D.   On: 09/16/2017 13:05    Review of Systems  Constitutional: Positive for malaise/fatigue.  HENT: Negative.   Eyes: Negative.  Respiratory: Negative.   Cardiovascular: Negative.   Gastrointestinal: Negative.   Genitourinary: Negative.   Musculoskeletal: Positive for myalgias.  Skin: Negative.   Neurological: Positive for weakness.  Endo/Heme/Allergies: Negative.   Psychiatric/Behavioral: Positive for depression and hallucinations. The patient is nervous/anxious.    Blood pressure (!) 128/93, pulse (!) 102, temperature 97.7 F (36.5 C), temperature source Oral, resp. rate 18, height 6' (1.829 m), weight 234 lb (106.1 kg), SpO2 98 %. Physical Exam  Nursing note and vitals reviewed. Constitutional: He is oriented to person, place, and time. He appears well-developed and well-nourished.  HENT:  Head: Normocephalic and atraumatic.  Eyes: Conjunctivae and EOM are normal. Pupils are equal, round, and reactive to light.  Neck: Normal range of motion. Neck supple.  Cardiovascular: Normal rate, regular rhythm and normal heart sounds.  Respiratory: Effort normal and breath sounds normal.  GI: Soft. Bowel sounds are normal.  Musculoskeletal: Normal range of motion.  Neurological: He is alert and oriented to person, place, and time. He has normal reflexes.  Skin: Skin is warm and dry.  Psychiatric: Judgment normal. His mood appears anxious. His affect is labile and inappropriate. His speech is rapid and/or pressured. He is actively hallucinating. Thought content is paranoid and delusional. Cognition and memory are impaired.    Assessment/Plan: 1 elevated  troponin Hypertension Obesity Diabetes Ventricular tachycardia Smoking Reported ventricular tachycardia paroxysmal Schizophrenia . Plan Agree with blood pressure control Agree with telemetry Continue diabetes management and control Advised patient to refrain from tobacco abuse Follow-up EKGs and troponins Continue medications for schizophrenia Recommend weight loss exercise portion control Continue Toprol therapy for possible nonsustained VT at this point Demand ischemia resulting over the troponin did not recommend any direct therapy At the patient follow-up with cardiology as an outpatient Vonne Mcdanel D Avanna Sowder 09/17/2017, 1:50 PM

## 2017-09-17 NOTE — Care Management Note (Signed)
Case Management Note  Patient Details  Name: Paul Hernandez MRN: [id number] Date of Birth: 1966/05/11  Subjective/Objective:                 Patient admitted from [facility] for elevated blood pressure and exac COPD  When asked he confirms he has a guardian- Paul Hernandez from the facility.  Patient is not requiring oxygen and says he is able to perform his own personal care  Action/Plan:   At present, anticipate return to Group Home at discharge.  Expected Discharge Date:  09/18/17               Expected Discharge Plan:     In-House Referral:     Discharge planning Services     Post Acute Care Choice:    Choice offered to:     DME Arranged:    DME Agency:     HH Arranged:    HH Agency:     Status of Service:     If discussed at [employer] Meetings, dates discussed:    Additional Comments:  Paul Hernandez, Paul Kellett R, RN 09/17/2017, 1:31 PM

## 2017-09-17 NOTE — Progress Notes (Signed)
Inpatient Diabetes Program Recommendations  AACE/ADA: New Consensus Statement on Inpatient Glycemic Control (2015)  Target Ranges:  Prepandial:   less than 140 mg/dL      Peak postprandial:   less than 180 mg/dL (1-2 hours)      Critically ill patients:  140 - 180 mg/dL   Results for Paul Hernandez, Paul Hernandez (MRN [id number]) as of 09/17/2017 15:24  Ref. Range 09/17/2017 07:36 09/17/2017 11:46  Glucose-Capillary Latest Ref Range: 65 - 99 mg/dL 409158 (H) 811279 (H)    Home DM Meds: Metformin 1500 mg AM/ 500 mg PM  Current Insulin Orders: Metformin 1000 mg BID      Novolog Sensitive Correction Scale/ SSI (0-9 units) TID AC + HS      Novolog 3 units TID      MD- Note patient getting Solumedrol 40 mg daily.  CBGs elevated likely due to steroids.  Please consider increasing Novolog Meal Coverage to: Novolog 5 units TID with meals (hold if pt eats <50% of meal)       --Will follow patient during hospitalization--  Ambrose FinlandJeannine Johnston Margueritte Guthridge RN, MSN, CDE Diabetes Coordinator Inpatient Glycemic Control Team Team Pager: [phone] (8a-5p)

## 2017-09-18 LAB — HEMOGLOBIN A1C
HEMOGLOBIN A1C: 8 % — AB (ref 4.8–5.6)
MEAN PLASMA GLUCOSE: 183 mg/dL

## 2017-09-18 LAB — CBC
HCT: 43.8 % (ref 40.0–52.0)
HEMOGLOBIN: 14.3 g/dL (ref 13.0–18.0)
MCH: 29.1 pg (ref 26.0–34.0)
MCHC: 32.6 g/dL (ref 32.0–36.0)
MCV: 89.2 fL (ref 80.0–100.0)
PLATELETS: 284 10*3/uL (ref 150–440)
RBC: 4.91 MIL/uL (ref 4.40–5.90)
RDW: 14.3 % (ref 11.5–14.5)
WBC: 11.8 10*3/uL — ABNORMAL HIGH (ref 3.8–10.6)

## 2017-09-18 LAB — BASIC METABOLIC PANEL
Anion gap: 10 (ref 5–15)
BUN: 28 mg/dL — AB (ref 6–20)
CHLORIDE: 98 mmol/L — AB (ref 101–111)
CO2: 25 mmol/L (ref 22–32)
CREATININE: 1.15 mg/dL (ref 0.61–1.24)
Calcium: 9.3 mg/dL (ref 8.9–10.3)
GFR calc Af Amer: >60 mL/min (ref >60)
GFR calc non Af Amer: >60 mL/min (ref >60)
GLUCOSE: 136 mg/dL — AB (ref 65–99)
POTASSIUM: 4.2 mmol/L (ref 3.5–5.1)
SODIUM: 133 mmol/L — AB (ref 135–145)

## 2017-09-18 LAB — HIV ANTIBODY (ROUTINE TESTING W REFLEX): HIV Screen 4th Generation wRfx: NONREACTIVE

## 2017-09-18 LAB — GLUCOSE, CAPILLARY: GLUCOSE-CAPILLARY: 136 mg/dL — AB (ref 65–99)

## 2017-09-18 NOTE — Discharge Summary (Signed)
[facility] - [place] [facility]   PATIENT NAME: Paul Hernandez    MR#:  [id number]  DATE OF BIRTH:  [birth year]  DATE OF ADMISSION:  09/16/2017   ADMITTING PHYSICIAN: Alford Highlandichard Wieting, MD  DATE OF DISCHARGE: 09/18/2017  PRIMARY CARE PHYSICIAN: Anselm JunglingHatchett, Mary, NP   ADMISSION DIAGNOSIS:  COPD exacerbation (HCC) [J44.1] Hypertensive urgency [I16.0] Elevated troponin I level [R74.8] DISCHARGE DIAGNOSIS:  Active Problems:   COPD exacerbation (HCC)  SECONDARY DIAGNOSIS:   Past Medical History:  Diagnosis Date  . Diabetes mellitus without complication (HCC)   . Hypertension    HOSPITAL COURSE:  51 y.o.malewith a known history of hypertension admitted for elevated blood pressure.   1. COPD: stable 2. Accelerated hypertension: Blood pressure much better.  Continue amlodipine, Lotensin, Catapres, Toprol, Max Zide. 3. Type 2 diabetes mellitus: stable 4. Tobacco abuse. Smoking cessation counseling done 4 minutes by Dr Renae GlossWieting. Refused nicotine patch. 5. Elevated troponin likely secondary to either accelerated hypertension and/or COPD exacerbation: Continue aspirin. DISCHARGE CONDITIONS:  stable CONSULTS OBTAINED:  Treatment Team:  Alwyn Peaallwood, Dwayne D, MD DRUG ALLERGIES:   Allergies  Allergen Reactions  . Asa [Aspirin]    DISCHARGE MEDICATIONS:   Allergies as of 09/18/2017      Reactions   Asa [aspirin]       Medication List    STOP taking these medications   predniSONE 10 MG tablet Commonly known as:  DELTASONE     TAKE these medications   albuterol 108 (90 Base) MCG/ACT inhaler Commonly known as:  PROVENTIL HFA;VENTOLIN HFA Inhale 2 puffs into the lungs every 6 (six) hours as needed for wheezing or shortness of breath.   amLODipine 10 MG tablet Commonly known as:  NORVASC Take 1 tablet (10 mg total) by mouth daily.   amLODipine-benazepril 10-20 MG capsule Commonly known as:  LOTREL Take 1 capsule by mouth every evening.   atorvastatin 40 MG tablet Commonly known as:  LIPITOR Take 40 mg by mouth at bedtime.   benztropine 2 MG tablet Commonly known as:  COGENTIN Take 2 mg by mouth at bedtime.   cloNIDine 0.3 MG tablet Commonly known as:  CATAPRES Take 0.3 mg by mouth 2 (two) times daily.   diphenhydrAMINE 25 mg capsule Commonly known as:  BENADRYL Take 25 mg by mouth at bedtime.   metFORMIN 500 MG tablet Commonly known as:  GLUCOPHAGE Take 1,000-1,500 mg by mouth daily with breakfast. Take 1500 mg in the morning and 500 mg at bedtime.   metoprolol 200 MG 24 hr tablet Commonly known as:  TOPROL-XL Take 400 mg by mouth daily.   triamterene-hydrochlorothiazide 75-50 MG tablet Commonly known as:  MAXZIDE Take 1 capsule by mouth daily.   Vitamin D3 5000 units Caps Take 5,000 Units by mouth daily.        DISCHARGE INSTRUCTIONS:   DIET:  Regular diet DISCHARGE CONDITION:  Good ACTIVITY:  Activity as tolerated OXYGEN:  Home Oxygen: No.  Oxygen Delivery: room air DISCHARGE LOCATION:  [facility]     If you experience worsening of your admission symptoms, develop shortness of breath, life threatening emergency, suicidal or homicidal thoughts you must seek medical attention immediately by calling 911 or calling your MD immediately  if symptoms less severe.  You Must read complete instructions/literature along with all the possible adverse reactions/side effects for all the Medicines you take and that have been prescribed to you. Take any new Medicines after you have completely understood and accpet all  the possible adverse reactions/side effects.   Please note  You were cared for by a hospitalist during your hospital stay. If you have any questions about your discharge medications or the care you received while you were in the hospital after you are discharged, you can call the unit and asked to speak with the hospitalist on call if the hospitalist that took care of you is  not available. Once you are discharged, your primary care physician will handle any further medical issues. Please note that NO REFILLS for any discharge medications will be authorized once you are discharged, as it is imperative that you return to your primary care physician (or establish a relationship with a primary care physician if you do not have one) for your aftercare needs so that they can reassess your need for medications and monitor your lab values.    On the day of Discharge:  VITAL SIGNS:  Blood pressure 103/71, pulse 85, temperature 98.5 F (36.9 C), temperature source Oral, resp. rate 17, height 6' (1.829 m), weight 106.1 kg (234 lb), SpO2 96 %. PHYSICAL EXAMINATION:  GENERAL:  51 y.o.-year-old patient lying in the bed with no acute distress.  EYES: Pupils equal, round, reactive to light and accommodation. No scleral icterus. Extraocular muscles intact.  HEENT: Head atraumatic, normocephalic. Oropharynx and nasopharynx clear.  NECK:  Supple, no jugular venous distention. No thyroid enlargement, no tenderness.  LUNGS: Normal breath sounds bilaterally, no wheezing, rales,rhonchi or crepitation. No use of accessory muscles of respiration.  CARDIOVASCULAR: S1, S2 normal. No murmurs, rubs, or gallops.  ABDOMEN: Soft, non-tender, non-distended. Bowel sounds present. No organomegaly or mass.  EXTREMITIES: No pedal edema, cyanosis, or clubbing.  NEUROLOGIC: Cranial nerves II through XII are intact. Muscle strength 5/5 in all extremities. Sensation intact. Gait not checked.  PSYCHIATRIC: The patient is alert and oriented x 3.  SKIN: No obvious rash, lesion, or ulcer.  DATA REVIEW:   CBC Recent Labs  Lab 09/18/17 0518  WBC 11.8*  HGB 14.3  HCT 43.8  PLT 284    Chemistries  Recent Labs  Lab 09/18/17 0518  NA 133*  K 4.2  CL 98*  CO2 25  GLUCOSE 136*  BUN 28*  CREATININE 1.15  CALCIUM 9.3     Microbiology Results  No results found for this or any previous  visit.  RADIOLOGY:  No results found.   Management plans discussed with the patient, family and they are in agreement.  CODE STATUS: Full Code   TOTAL TIME TAKING CARE OF THIS PATIENT: 45 minutes.    Delfino LovettVipul Sybrina Laning M.D on 09/18/2017 at 10:22 AM  Between 7am to 6pm - Pager - [phone]  After 6pm go to [email or web address] - Social research officer, [email or web address] [email or web address]  [facility]  Hospitalists  Office  [phone]  CC: Primary care physician; Anselm JunglingHatchett, Mary, NP   Note: This dictation was prepared with Dragon dictation along with smaller phrase technology. Any transcriptional errors that result from this process are unintentional.

## 2017-09-18 NOTE — Clinical Social Work Note (Signed)
Clinical Social Work Assessment  Patient Details  Name: Paul Hernandez MRN: [id number] Date of Birth: September 27, 1966  Date of referral:  09/18/17               Reason for consult:  Discharge Planning                Permission sought to share information with:    Permission granted to share information::     Name::        Agency::     Relationship::     Contact Information:     Housing/Transportation Living arrangements for the past 2 months:  Group Home Source of Information:  Facility Patient Interpreter Needed:  None Criminal Activity/Legal Involvement Pertinent to Current Situation/Hospitalization:  No - Comment as needed Significant Relationships:    Lives with:    Do you feel safe going back to the place where you live?    Need for family participation in patient care:     Care giving concerns:  [facility].   Social Worker assessment / plan:  MD discharging patient today to return to [facility]. CSW spoke with Genelle BalHelen Graves at the group home and she stated patient does not have a guardian and that patient is his own guardian. Ms. [name] stated she would send someone to pick patient up this morning.  Employment status:    Insurance information:    PT Recommendations:    Information / Referral to community resources:     Patient/Family's Response to care:  Patient has no concerns regarding returning to his group home.  Patient/Family's Understanding of and Emotional Response to Diagnosis, Current Treatment, and Prognosis:  Patient is in agreement with discharge today.  Emotional Assessment Appearance:    Attitude/Demeanor/Rapport:    Affect (typically observed):    Orientation:    Alcohol / Substance use:  Not Applicable Psych involvement (Current and /or in the community):  No (Comment)  Discharge Needs  Concerns to be addressed:  No discharge needs identified Readmission within the last 30 days:  No Current discharge  risk:  None Barriers to Discharge:  No Barriers Identified   York SpanielMonica Emberley Kral, LCSW 09/18/2017, 10:57 AM

## 2017-09-18 NOTE — Care Management Important Message (Signed)
Important Message  Patient Details  Name: Paul Hernandez MRN: [id number] Date of Birth: Mar 01, 1966   Medicare Important Message Given:  N/A - LOS <3 / Initial given by admissions    Eber HongGreene, Issacc Merlo R, RN 09/18/2017, 2:15 PM

## 2017-09-18 NOTE — [place] FL2 (Signed)
[place] MEDICAID FL2 LEVEL OF CARE SCREENING TOOL     IDENTIFICATION  Patient Name: Paul Hernandez Birthdate: 1965/11/25 Sex: male Admission Date (Current Location): 09/16/2017  [place]   Facility and Address:  [facility], [address], [place], [place] [address]      Provider Number: [id number]  Attending Physician Name and Address:  Delfino LovettShah, Vipul, MD  Relative Name and Phone Number:       Current Level of Care: Hospital Recommended Level of Care: (group home) Prior Approval Number:    Date Approved/Denied:   PASRR Number:    Discharge Plan: (group home)    Current Diagnoses: Patient Active Problem List   Diagnosis Date Noted  . COPD exacerbation (HCC) 09/16/2017    Orientation RESPIRATION BLADDER Height & Weight     Self, Time, Situation, Place  Normal Continent Weight: 234 lb (106.1 kg) Height:  6' (182.9 cm)  BEHAVIORAL SYMPTOMS/MOOD NEUROLOGICAL BOWEL NUTRITION STATUS  (none) (none) Continent Diet(regular)  AMBULATORY STATUS COMMUNICATION OF NEEDS Skin   Supervision Verbally Normal                       Personal Care Assistance Level of Assistance  Bathing, Dressing Bathing Assistance: Limited assistance   Dressing Assistance: Limited assistance     Functional Limitations Info  (no issues)          SPECIAL CARE FACTORS FREQUENCY                       Contractures Contractures Info: Not present    Additional Factors Info  Code Status, Allergies Code Status Info: full Allergies Info: asa           DISCHARGE MEDICATIONS:       Allergies as of 09/18/2017      Reactions   Asa [aspirin]               Medication List     STOP taking these medications   predniSONE 10 MG tablet Commonly known as:  DELTASONE     TAKE these medications   albuterol 108 (90 Base) MCG/ACT inhaler Commonly known as:  PROVENTIL HFA;VENTOLIN HFA Inhale 2 puffs into the  lungs every 6 (six) hours as needed for wheezing or shortness of breath.   amLODipine 10 MG tablet Commonly known as:  NORVASC Take 1 tablet (10 mg total) by mouth daily.   amLODipine-benazepril 10-20 MG capsule Commonly known as:  LOTREL Take 1 capsule by mouth every evening.   atorvastatin 40 MG tablet Commonly known as:  LIPITOR Take 40 mg by mouth at bedtime.   benztropine 2 MG tablet Commonly known as:  COGENTIN Take 2 mg by mouth at bedtime.   cloNIDine 0.3 MG tablet Commonly known as:  CATAPRES Take 0.3 mg by mouth 2 (two) times daily.   diphenhydrAMINE 25 mg capsule Commonly known as:  BENADRYL Take 25 mg by mouth at bedtime.   metFORMIN 500 MG tablet Commonly known as:  GLUCOPHAGE Take 1,000-1,500 mg by mouth daily with breakfast. Take 1500 mg in the morning and 500 mg at bedtime.   metoprolol 200 MG 24 hr tablet Commonly known as:  TOPROL-XL Take 400 mg by mouth daily.   triamterene-hydrochlorothiazide 75-50 MG tablet Commonly known as:  MAXZIDE Take 1 capsule by mouth daily.   Vitamin D3 5000 units Caps Take 5,000 Units by mouth daily.  Additional Information    Shela Leff, LCSW

## 2017-09-18 NOTE — Plan of Care (Signed)
  Progressing Clinical Measurements: Respiratory complications will improve 09/18/2017 0151 - Progressing by Stefan Churchogers, Timea Breed M, RN

## 2017-09-18 NOTE — Progress Notes (Signed)
Pt discharged to group home  via wc.  Instructions given to pt.  Questions answered.  No distress.

## 2017-09-18 NOTE — Discharge Instructions (Signed)

## 2017-10-25 ENCOUNTER — Emergency Department: Payer: Medicare Other

## 2017-10-25 ENCOUNTER — Emergency Department
Admission: EM | Admit: 2017-10-25 | Discharge: 2017-10-26 | Disposition: A | Discharge status: Home / Self Care | Payer: Medicare Other | Attending: Emergency Medicine | Admitting: Emergency Medicine

## 2017-10-25 DIAGNOSIS — I10 Essential (primary) hypertension: Secondary | ICD-10-CM | POA: Insufficient documentation

## 2017-10-25 DIAGNOSIS — Z79899 Other long term (current) drug therapy: Secondary | ICD-10-CM | POA: Insufficient documentation

## 2017-10-25 DIAGNOSIS — E119 Type 2 diabetes mellitus without complications: Secondary | ICD-10-CM | POA: Diagnosis not present

## 2017-10-25 DIAGNOSIS — F17210 Nicotine dependence, cigarettes, uncomplicated: Secondary | ICD-10-CM | POA: Diagnosis not present

## 2017-10-25 DIAGNOSIS — Z7984 Long term (current) use of oral hypoglycemic drugs: Secondary | ICD-10-CM | POA: Diagnosis not present

## 2017-10-25 LAB — CBC
HEMATOCRIT: 45.6 % (ref 40.0–52.0)
Hemoglobin: 14.8 g/dL (ref 13.0–18.0)
MCH: 28.6 pg (ref 26.0–34.0)
MCHC: 32.5 g/dL (ref 32.0–36.0)
MCV: 88 fL (ref 80.0–100.0)
PLATELETS: 277 10*3/uL (ref 150–440)
RBC: 5.18 MIL/uL (ref 4.40–5.90)
RDW: 14.2 % (ref 11.5–14.5)
WBC: 9.6 10*3/uL (ref 3.8–10.6)

## 2017-10-25 LAB — BASIC METABOLIC PANEL
Anion gap: 10 (ref 5–15)
BUN: 10 mg/dL (ref 6–20)
CHLORIDE: 101 mmol/L (ref 101–111)
CO2: 28 mmol/L (ref 22–32)
Calcium: 8.9 mg/dL (ref 8.9–10.3)
Creatinine, Ser: 0.97 mg/dL (ref 0.61–1.24)
Glucose, Bld: 199 mg/dL — ABNORMAL HIGH (ref 65–99)
POTASSIUM: 3.8 mmol/L (ref 3.5–5.1)
SODIUM: 139 mmol/L (ref 135–145)

## 2017-10-25 LAB — TROPONIN I
TROPONIN I: 0.05 ng/mL — AB (ref <0.03)
Troponin I: 0.07 ng/mL (ref <0.03)

## 2017-10-25 NOTE — ED Provider Notes (Signed)
[facility] Emergency Department Provider Note  ____________________________________________  Time seen: Approximately 11:25 PM  I have reviewed the triage vital signs and the nursing notes.   HISTORY  Chief Complaint Hypertension  Level 5 Caveat: Portions of the History and Physical are unable to be obtained due to patient being a poor historian   HPI Paul Hernandez is a 52 y.o. male sent to the ED from group home due to high blood pressure. Patient denies any complaints whatsoever. Has a primary care doctor that visits to group home once a month but he doesn't really like that doctor. He is not really sure what his medications are. Says that he takes them some times but other times he doesn't feel like it. No chest pain shortness of breath abdominal pain back pain or headache. No syncope.   Past Medical History:  Diagnosis Date  . Diabetes mellitus without complication (HCC)   . Hypertension      Patient Active Problem List   Diagnosis Date Noted  . COPD exacerbation (HCC) 09/16/2017     Past Surgical History:  Procedure Laterality Date  . NO PAST SURGERIES       Prior to Admission medications   Medication Sig Start Date End Date Taking? Authorizing Provider  amLODipine-benazepril (LOTREL) 10-20 MG capsule Take 1 capsule by mouth every evening.    Yes [provider]  atorvastatin (LIPITOR) 40 MG tablet Take 40 mg by mouth at bedtime.    Yes [provider]  benztropine (COGENTIN) 2 MG tablet Take 2 mg by mouth at bedtime.   Yes [provider]  Cholecalciferol (VITAMIN D3) 5000 units CAPS Take 5,000 Units by mouth daily.   Yes [provider]  cloNIDine (CATAPRES) 0.3 MG tablet Take 0.3 mg by mouth every 8 (eight) hours.    Yes [provider]  diphenhydrAMINE (BENADRYL) 25 mg capsule Take 25 mg by mouth at bedtime.    Yes [provider]  haloperidol decanoate (HALDOL DECANOATE) 100 MG/ML  injection Inject 100 mg into the muscle every 28 (twenty-eight) days.   Yes [provider]  metFORMIN (GLUCOPHAGE) 500 MG tablet Take 1,000-1,500 mg by mouth daily with breakfast. Take 1500 mg in the morning and 1,000 mg at bedtime.   Yes [provider]  metoprolol (TOPROL-XL) 200 MG 24 hr tablet Take 400 mg by mouth daily.    Yes [provider]  triamterene-hydrochlorothiazide (MAXZIDE) 75-50 MG tablet Take 1 capsule by mouth daily.   Yes [provider]  albuterol (PROVENTIL HFA;VENTOLIN HFA) 108 (90 Base) MCG/ACT inhaler Inhale 2 puffs into the lungs every 6 (six) hours as needed for wheezing or shortness of breath. 08/13/17   Tommi RumpsSummers, Rhonda L, PA-C  amLODipine (NORVASC) 10 MG tablet Take 1 tablet (10 mg total) by mouth daily. Patient not taking: Reported on 09/16/2017 08/10/16 08/10/17  Minna AntisPaduchowski, Kevin, MD     Allergies Jonne PlyAsa [aspirin]   Family History  Problem Relation Age of Onset  . CVA Mother   . CAD Father     Social History Social History   Tobacco Use  . Smoking status: Current Some Day Smoker    Packs/day: 0.50  . Smokeless tobacco: Current User  Substance Use Topics  . Alcohol use: No  . Drug use: No    Review of Systems  Constitutional:   No fever or chills.  ENT:   No sore throat. No rhinorrhea. Cardiovascular:   No chest pain or syncope. Respiratory:  No dyspnea or cough. Gastrointestinal:   Negative for abdominal pain, vomiting and diarrhea.  Musculoskeletal:   Negative for focal pain or swelling All other systems reviewed and are negative except as documented above in ROS and HPI.  ____________________________________________   PHYSICAL EXAM:  VITAL SIGNS: ED Triage Vitals  Enc Vitals Group     BP 10/25/17 1912 (!) 165/115     Pulse Rate 10/25/17 1912 (!) 113     Resp 10/25/17 1912 20     Temp 10/25/17 1912 98.9 F (37.2 C)     Temp Source 10/25/17 1912 Oral     SpO2 10/25/17 1912 97 %     Weight  10/25/17 1913 235 lb (106.6 kg)     Height --      Head Circumference --      Peak Flow --      Pain Score --      Pain Loc --      Pain Edu? --      Excl. in GC? --     Vital signs reviewed, nursing assessments reviewed.   Constitutional:   Alert and oriented. Well appearing and in no distress. Eyes:   No scleral icterus.  EOMI. No nystagmus. No conjunctival pallor. PERRL. ENT   Head:   Normocephalic and atraumatic.   Nose:   No congestion/rhinnorhea.    Mouth/Throat:   MMM, no pharyngeal erythema. No peritonsillar mass.    Neck:   No meningismus. Full ROM. Thyroid nonpalpable Hematological/Lymphatic/Immunilogical:   No cervical lymphadenopathy. Cardiovascular:   RRR. Symmetric bilateral radial and DP pulses.  No murmurs.  Respiratory:   Normal respiratory effort without tachypnea/retractions. Breath sounds are clear and equal bilaterally. No wheezes/rales/rhonchi. Gastrointestinal:   Soft and nontender. Non distended. There is no CVA tenderness.  No rebound, rigidity, or guarding. Genitourinary:   deferred Musculoskeletal:   Normal range of motion in all extremities. No joint effusions.  No lower extremity tenderness.  No edema. Neurologic:   Normal speech and language.  Motor grossly intact. No gross focal neurologic deficits are appreciated.  Skin:    Skin is warm, dry and intact. No rash noted.  No petechiae, purpura, or bullae.  ____________________________________________    LABS (pertinent positives/negatives) (all labs ordered are listed, but only abnormal results are displayed) Labs Reviewed  BASIC METABOLIC PANEL - Abnormal; Notable for the following components:      Result Value   Glucose, Bld 199 (*)    All other components within normal limits  TROPONIN I - Abnormal; Notable for the following components:   Troponin I 0.07 (*)    All other components within normal limits  TROPONIN I - Abnormal; Notable for the following components:   Troponin I  0.05 (*)    All other components within normal limits  CBC   ____________________________________________   EKG  Interpreted by me Sinus tachycardia rate 110, normal axis and intervals. Poor R-wave progression. Normal ST segments. Slight T-wave inversions in inferior and lateral leads, unchanged from November 2018.  ____________________________________________    RADIOLOGY  Dg Chest 2 View  Result Date: 10/25/2017 CLINICAL DATA:  Hypertension EXAM: CHEST  2 VIEW COMPARISON:  09/16/2017 FINDINGS: Stable cardiomegaly with minimal aortic atherosclerosis. No active pulmonary disease. No acute osseous abnormality. IMPRESSION: Cardiomegaly without active pulmonary disease. Electronically Signed   By: Tollie Eth M.D.   On: 10/25/2017 20:01    ____________________________________________   PROCEDURES Procedures  ____________________________________________    CLINICAL IMPRESSION / ASSESSMENT AND PLAN /  ED COURSE  Pertinent labs & imaging results that were available during my care of the patient were reviewed by me and considered in my medical decision making (see chart for details).   Patient well appearing no acute distress, sent to the ED for evaluation of high blood pressure. Here his blood pressure is 9 severe range. He has no symptoms no evidence of end organ dysfunction. His troponins are chronically elevated very slightly, and today's labs are consistent with his chronic baseline. Was unable to get his medication regimen verified by the pharmacy tech, and he did not arrive with a current medication list. Unable to provide any dose adjustments today. In any event, he is asymptomatic with overall non-alarming blood pressure. Suitable for outpatient follow-up with his primary care doctor.      ____________________________________________   FINAL CLINICAL IMPRESSION(S) / ED DIAGNOSES    Final diagnoses:  Essential hypertension       Portions of this note were  generated with dragon dictation software. Dictation errors may occur despite best attempts at proofreading.    Sharman Cheek, MD 10/25/17 2328

## 2017-10-25 NOTE — ED Notes (Signed)
Elevated troponin of 0.07 reported to Dr Scotty CourtStafford - order to repeat troponin at 10pm

## 2017-10-25 NOTE — ED Notes (Signed)
Pt reports that he is here for hypertension - denies shortness of breath or chest pain - denies any pain However, pt presents as though he is short of breath with a congested cough and the strong smell of cigarettes - pt has wheezing in bilat lower lung fields - O2 sat 96% on RA

## 2017-10-25 NOTE — ED Triage Notes (Signed)
Patient sent by group home for hypertension. Patient denies pain (head, chest, etc...). Patient denies visual symptoms, dizziness.

## 2017-10-25 NOTE — Discharge Instructions (Signed)
Continue taking all of your blood pressure medicines as prescribed.  Follow up with your doctor to re-evaluate your blood pressure control regimen.  We were unable to verify your medications in the ER tonight, so we could not adjust your medications with confidence.

## 2017-10-26 NOTE — ED Notes (Signed)
Pt stable at this time. Given chocolate milk per request

## 2017-10-26 NOTE — ED Notes (Signed)
Paul GeraldsJohn Hernandez [phone] [facility] was contacted. States in route to pick pt up at d/c. D/c instructions reviewed with Mr [name] via phone and pt. All parties verbalize understanding.

## 2017-11-13 DIAGNOSIS — I10 Essential (primary) hypertension: Secondary | ICD-10-CM | POA: Insufficient documentation

## 2017-11-13 DIAGNOSIS — E119 Type 2 diabetes mellitus without complications: Secondary | ICD-10-CM | POA: Insufficient documentation

## 2017-11-13 NOTE — Progress Notes (Deleted)
Cardiology Office Note  Date:  11/13/2017   ID:  Paul Hernandez, DOB 20-Jan-1966, MRN [id number]  PCP:  Anselm Jungling, NP   No chief complaint on file.   HPI:  Paul Hernandez is a 52 y.o. male  DM HTN  In the ER 10/25/2017 with HTN  sent to the ED from group home due to high blood pressure. Patient denies any complaints whatsoever. Has a primary care doctor that visits to group home once a month but he doesn't really like that doctor. He is not really sure what his medications are. Says that he takes them some times but other times he doesn't feel like it. No chest pain shortness of breath abdominal pain back pain or headache. No syncope.    PMH:   has a past medical history of Diabetes mellitus without complication (HCC) and Hypertension.  PSH:    Past Surgical History:  Procedure Laterality Date  . NO PAST SURGERIES      Current Outpatient Medications  Medication Sig Dispense Refill  . albuterol (PROVENTIL HFA;VENTOLIN HFA) 108 (90 Base) MCG/ACT inhaler Inhale 2 puffs into the lungs every 6 (six) hours as needed for wheezing or shortness of breath. 1 Inhaler 2  . amLODipine (NORVASC) 10 MG tablet Take 1 tablet (10 mg total) by mouth daily. (Patient not taking: Reported on 09/16/2017) 30 tablet 1  . amLODipine-benazepril (LOTREL) 10-20 MG capsule Take 1 capsule by mouth every evening.     Marland Kitchen atorvastatin (LIPITOR) 40 MG tablet Take 40 mg by mouth at bedtime.     . benztropine (COGENTIN) 2 MG tablet Take 2 mg by mouth at bedtime.    . Cholecalciferol (VITAMIN D3) 5000 units CAPS Take 5,000 Units by mouth daily.    . cloNIDine (CATAPRES) 0.3 MG tablet Take 0.3 mg by mouth every 8 (eight) hours.     . diphenhydrAMINE (BENADRYL) 25 mg capsule Take 25 mg by mouth at bedtime.     . haloperidol decanoate (HALDOL DECANOATE) 100 MG/ML injection Inject 100 mg into the muscle every 28 (twenty-eight) days.    . metFORMIN (GLUCOPHAGE) 500 MG tablet Take 1,000-1,500 mg by mouth daily with  breakfast. Take 1500 mg in the morning and 1,000 mg at bedtime.    . metoprolol (TOPROL-XL) 200 MG 24 hr tablet Take 400 mg by mouth daily.     Marland Kitchen triamterene-hydrochlorothiazide (MAXZIDE) 75-50 MG tablet Take 1 capsule by mouth daily.     No current facility-administered medications for this visit.      Allergies:   Asa [aspirin]   Social History:  The patient  reports that he has been smoking.  He has been smoking about 0.50 packs per day. He uses smokeless tobacco. He reports that he does not drink alcohol or use drugs.   Family History:   family history includes CAD in his father; CVA in his mother.    Review of Systems: ROS   PHYSICAL EXAM: VS:  There were no vitals taken for this visit. , BMI There is no height or weight on file to calculate BMI. GEN: Well nourished, well developed, in no acute distress HEENT: normal Neck: no JVD, carotid bruits, or masses Cardiac: RRR; no murmurs, rubs, or gallops,no edema  Respiratory:  clear to auscultation bilaterally, normal work of breathing GI: soft, nontender, nondistended, + BS MS: no deformity or atrophy Skin: warm and dry, no rash Neuro:  Strength and sensation are intact Psych: euthymic mood, full affect    Recent Labs: 09/16/2017: B  Natriuretic Peptide 680.0 10/25/2017: BUN 10; Creatinine, Ser 0.97; Hemoglobin 14.8; Platelets 277; Potassium 3.8; Sodium 139    Lipid Panel Lab Results  Component Value Date   CHOL 219 (H) 09/17/2017   HDL 45 09/17/2017   LDLCALC 163 (H) 09/17/2017   TRIG 56 09/17/2017      Wt Readings from Last 3 Encounters:  10/25/17 235 lb (106.6 kg)  09/16/17 234 lb (106.1 kg)  08/13/17 239 lb (108.4 kg)       ASSESSMENT AND PLAN:  No diagnosis found.   Disposition:   F/U  6 months  No orders of the defined types were placed in this encounter.    Signed, Dossie Arbourim Philamena Kramar, M.D., Ph.D. 11/13/2017  [facility], [place] [phone]

## 2017-11-14 ENCOUNTER — Institutional Professional Consult (permissible substitution): Payer: Medicare Other | Admitting: Internal Medicine

## 2017-11-14 ENCOUNTER — Ambulatory Visit: Payer: Medicare Other | Admitting: Cardiovascular Disease

## 2017-11-14 NOTE — Progress Notes (Deleted)
[facility] A[facility] and Htn.  Imaging personally reviewed, CXR 10/25/17; lung unremarkable.

## 2017-11-15 ENCOUNTER — Encounter: Payer: Self-pay | Admitting: Cardiovascular Disease

## 2017-11-15 ENCOUNTER — Emergency Department: Payer: Medicare Other

## 2017-11-15 ENCOUNTER — Encounter: Payer: Self-pay | Admitting: Emergency Medicine

## 2017-11-15 ENCOUNTER — Inpatient Hospital Stay
Admission: EM | Admit: 2017-11-15 | Discharge: 2017-11-16 | DRG: 065 | Disposition: A | Discharge status: Nursing Home (Includes ICF) | Payer: Medicare Other | Attending: Internal Medicine | Admitting: Internal Medicine

## 2017-11-15 ENCOUNTER — Other Ambulatory Visit: Payer: Self-pay

## 2017-11-15 ENCOUNTER — Encounter: Payer: Self-pay | Admitting: Internal Medicine

## 2017-11-15 DIAGNOSIS — E119 Type 2 diabetes mellitus without complications: Secondary | ICD-10-CM | POA: Diagnosis present

## 2017-11-15 DIAGNOSIS — I429 Cardiomyopathy, unspecified: Secondary | ICD-10-CM | POA: Diagnosis present

## 2017-11-15 DIAGNOSIS — F209 Schizophrenia, unspecified: Secondary | ICD-10-CM | POA: Diagnosis present

## 2017-11-15 DIAGNOSIS — R4182 Altered mental status, unspecified: Secondary | ICD-10-CM

## 2017-11-15 DIAGNOSIS — Z79899 Other long term (current) drug therapy: Secondary | ICD-10-CM | POA: Diagnosis not present

## 2017-11-15 DIAGNOSIS — I639 Cerebral infarction, unspecified: Secondary | ICD-10-CM | POA: Diagnosis present

## 2017-11-15 DIAGNOSIS — F17210 Nicotine dependence, cigarettes, uncomplicated: Secondary | ICD-10-CM | POA: Diagnosis present

## 2017-11-15 DIAGNOSIS — I6340 Cerebral infarction due to embolism of unspecified cerebral artery: Principal | ICD-10-CM | POA: Diagnosis present

## 2017-11-15 DIAGNOSIS — I10 Essential (primary) hypertension: Secondary | ICD-10-CM | POA: Diagnosis not present

## 2017-11-15 DIAGNOSIS — Z8249 Family history of ischemic heart disease and other diseases of the circulatory system: Secondary | ICD-10-CM | POA: Diagnosis not present

## 2017-11-15 DIAGNOSIS — R4789 Other speech disturbances: Secondary | ICD-10-CM

## 2017-11-15 DIAGNOSIS — F99 Mental disorder, not otherwise specified: Secondary | ICD-10-CM

## 2017-11-15 DIAGNOSIS — I6782 Cerebral ischemia: Secondary | ICD-10-CM | POA: Diagnosis present

## 2017-11-15 DIAGNOSIS — R4701 Aphasia: Secondary | ICD-10-CM | POA: Diagnosis not present

## 2017-11-15 DIAGNOSIS — J449 Chronic obstructive pulmonary disease, unspecified: Secondary | ICD-10-CM | POA: Diagnosis not present

## 2017-11-15 DIAGNOSIS — Z7984 Long term (current) use of oral hypoglycemic drugs: Secondary | ICD-10-CM | POA: Diagnosis not present

## 2017-11-15 DIAGNOSIS — R0602 Shortness of breath: Secondary | ICD-10-CM | POA: Diagnosis present

## 2017-11-15 DIAGNOSIS — Z823 Family history of stroke: Secondary | ICD-10-CM

## 2017-11-15 DIAGNOSIS — R29704 NIHSS score 4: Secondary | ICD-10-CM | POA: Diagnosis not present

## 2017-11-15 HISTORY — DX: Mental disorder, not otherwise specified: F99

## 2017-11-15 LAB — DIFFERENTIAL
Basophils Absolute: 0 10*3/uL (ref 0–0.1)
Basophils Relative: 1 %
EOS PCT: 1 %
Eosinophils Absolute: 0 10*3/uL (ref 0–0.7)
LYMPHS ABS: 0.8 10*3/uL — AB (ref 1.0–3.6)
LYMPHS PCT: 16 %
MONO ABS: 0.4 10*3/uL (ref 0.2–1.0)
Monocytes Relative: 9 %
NEUTROS PCT: 73 %
Neutro Abs: 3.6 10*3/uL (ref 1.4–6.5)

## 2017-11-15 LAB — COMPREHENSIVE METABOLIC PANEL
ALBUMIN: 4.4 g/dL (ref 3.5–5.0)
ALK PHOS: 72 U/L (ref 38–126)
ALT: 14 U/L — ABNORMAL LOW (ref 17–63)
ANION GAP: 9 (ref 5–15)
AST: 20 U/L (ref 15–41)
BILIRUBIN TOTAL: 0.6 mg/dL (ref 0.3–1.2)
BUN: 15 mg/dL (ref 6–20)
CALCIUM: 9.3 mg/dL (ref 8.9–10.3)
CO2: 27 mmol/L (ref 22–32)
CREATININE: 0.97 mg/dL (ref 0.61–1.24)
Chloride: 104 mmol/L (ref 101–111)
GFR calc Af Amer: >60 mL/min (ref >60)
GFR calc non Af Amer: >60 mL/min (ref >60)
GLUCOSE: 190 mg/dL — AB (ref 65–99)
Potassium: 3.9 mmol/L (ref 3.5–5.1)
Sodium: 140 mmol/L (ref 135–145)
TOTAL PROTEIN: 7.4 g/dL (ref 6.5–8.1)

## 2017-11-15 LAB — BLOOD GAS, VENOUS
ACID-BASE EXCESS: 2.1 mmol/L — AB (ref 0.0–2.0)
Bicarbonate: 28.8 mmol/L — ABNORMAL HIGH (ref 20.0–28.0)
O2 SAT: 60.6 %
PATIENT TEMPERATURE: 37
PO2 VEN: 33 mmHg (ref 32.0–45.0)
pCO2, Ven: 51 mmHg (ref 44.0–60.0)
pH, Ven: 7.36 (ref 7.250–7.430)

## 2017-11-15 LAB — TROPONIN I: Troponin I: 0.04 ng/mL (ref <0.03)

## 2017-11-15 LAB — CBC
HCT: 47.4 % (ref 40.0–52.0)
HEMOGLOBIN: 15.7 g/dL (ref 13.0–18.0)
MCH: 29.1 pg (ref 26.0–34.0)
MCHC: 33.1 g/dL (ref 32.0–36.0)
MCV: 88 fL (ref 80.0–100.0)
Platelets: 291 10*3/uL (ref 150–440)
RBC: 5.39 MIL/uL (ref 4.40–5.90)
RDW: 14.9 % — ABNORMAL HIGH (ref 11.5–14.5)
WBC: 4.9 10*3/uL (ref 3.8–10.6)

## 2017-11-15 LAB — ETHANOL: Alcohol, Ethyl (B): <10 mg/dL (ref <10)

## 2017-11-15 LAB — PROTIME-INR
INR: 0.99
Prothrombin Time: 13 seconds (ref 11.4–15.2)

## 2017-11-15 LAB — APTT: aPTT: 33 seconds (ref 24–36)

## 2017-11-15 MED ORDER — ATORVASTATIN CALCIUM 20 MG PO TABS
40.0000 mg | ORAL_TABLET | Freq: Every day | ORAL | Status: DC
  Administered 2017-11-15: 40 mg via ORAL
  Filled 2017-11-15: qty 2

## 2017-11-15 MED ORDER — ACETAMINOPHEN 325 MG PO TABS: 650.0000 mg | ORAL_TABLET | ORAL | Status: DC | PRN

## 2017-11-15 MED ORDER — ACETAMINOPHEN 650 MG RE SUPP: 650.0000 mg | RECTAL | Status: DC | PRN

## 2017-11-15 MED ORDER — HALOPERIDOL LACTATE 5 MG/ML IJ SOLN
5.0000 mg | Freq: Once | INTRAMUSCULAR | Status: AC
  Administered 2017-11-15: 5 mg via INTRAMUSCULAR
  Filled 2017-11-15: qty 1

## 2017-11-15 MED ORDER — ASPIRIN EC 325 MG PO TBEC
325.0000 mg | DELAYED_RELEASE_TABLET | Freq: Once | ORAL | Status: AC
  Administered 2017-11-15: 325 mg via ORAL
  Filled 2017-11-15: qty 1

## 2017-11-15 MED ORDER — IPRATROPIUM-ALBUTEROL 0.5-2.5 (3) MG/3ML IN SOLN
3.0000 mL | Freq: Once | RESPIRATORY_TRACT | Status: DC
  Filled 2017-11-15: qty 3

## 2017-11-15 MED ORDER — ENOXAPARIN SODIUM 40 MG/0.4ML ~~LOC~~ SOLN: 40.0000 mg | SUBCUTANEOUS | Status: DC

## 2017-11-15 MED ORDER — INSULIN ASPART 100 UNIT/ML ~~LOC~~ SOLN: 0.0000 [IU] | Freq: Every day | SUBCUTANEOUS | Status: DC

## 2017-11-15 MED ORDER — LORAZEPAM 0.5 MG PO TABS
0.5000 mg | ORAL_TABLET | Freq: Once | ORAL | Status: DC
  Filled 2017-11-15: qty 1

## 2017-11-15 MED ORDER — ACETAMINOPHEN 160 MG/5ML PO SOLN: 650.0000 mg | ORAL | Status: DC | PRN

## 2017-11-15 MED ORDER — INSULIN ASPART 100 UNIT/ML ~~LOC~~ SOLN
0.0000 [IU] | Freq: Three times a day (TID) | SUBCUTANEOUS | Status: DC
  Administered 2017-11-16: 2 [IU] via SUBCUTANEOUS
  Filled 2017-11-15: qty 1

## 2017-11-15 MED ORDER — MIDAZOLAM HCL 5 MG/5ML IJ SOLN
INTRAMUSCULAR | Status: AC
  Filled 2017-11-15: qty 5

## 2017-11-15 MED ORDER — MIDAZOLAM HCL 5 MG/5ML IJ SOLN
1.0000 mg | Freq: Once | INTRAMUSCULAR | Status: AC
  Administered 2017-11-15: 1 mg via INTRAVENOUS

## 2017-11-15 MED ORDER — IOPAMIDOL (ISOVUE-370) INJECTION 76%
100.0000 mL | Freq: Once | INTRAVENOUS | Status: AC | PRN
  Administered 2017-11-15: 100 mL via INTRAVENOUS

## 2017-11-15 MED ORDER — DIPHENHYDRAMINE HCL 25 MG PO CAPS
25.0000 mg | ORAL_CAPSULE | Freq: Every day | ORAL | Status: DC
  Filled 2017-11-15: qty 1

## 2017-11-15 MED ORDER — BENZTROPINE MESYLATE 1 MG PO TABS
2.0000 mg | ORAL_TABLET | Freq: Every day | ORAL | Status: DC
  Administered 2017-11-15: 2 mg via ORAL
  Filled 2017-11-15 (×2): qty 2

## 2017-11-15 MED ORDER — SODIUM CHLORIDE 0.9 % IV BOLUS (SEPSIS)
1000.0000 mL | Freq: Once | INTRAVENOUS | Status: AC
  Administered 2017-11-15: 1000 mL via INTRAVENOUS

## 2017-11-15 MED ORDER — STROKE: EARLY STAGES OF RECOVERY BOOK
Freq: Once | Status: AC
  Administered 2017-11-15

## 2017-11-15 NOTE — ED Notes (Signed)
Patient transported to 118 

## 2017-11-15 NOTE — ED Notes (Signed)
Pt to CT at this time with security

## 2017-11-15 NOTE — Consult Note (Addendum)
[facility] Tele[facility]urology Consult Services  Impression:  Stroke  Not a tpa candidate due to: Unknown past medical history, the patietn states that he speaks like this but my clinical suspicion is that he is aphasic. But the problem, is that the patient has issues with intellectual disabiltly and there is no way to verify his baseline, as the group home does not know his basline.  The attending physician in the Ed, Agrees with no tPA, as we do not have a baseline, patient is not consentable. My NIHSS is 1.  ADDENDUM: NO LVO, no intevnetino.  CTA H/N and CTP is positive for bilateral acute infarctions. Unable to obtain consent from the patient, in addition to this unable to obtain history of possible bleeding in the brain, sometimes the patient states no, med history is obtained by nursing home. Patient states  'no he does want medication, then states, yes, then changes his mind, when I offered tPA' Tried multiple times to get in contact with emergency contact providers, and group home does not know his full medical history.   We did not give tPA for three reasons: #1. No certain history of brain bleeding and could not rule out. (No definitive answers') #2. Patient has intellectual disabilty and is not consentable, verbally and does not demonstrate understanding of the risks and benefits. AND every attempt to speak to his emergency contacts is not able.  #3. Patient changes his mind, and not sure if he comprehends what we are stating. He clearly does not understand the risks and the benefits of tPA. #4. Attending physician and myself agree that the patient though may be having a stroke, and within the window for tPA, is not consentable, and hence we will desist on our level best attempts of trying administert tPA with informed consent in an intellectually disabled patient.     Differential Diagnosis:   1. Cardioembolic stroke  2. Small vessel disease/lacune  3. Thromboembolic,  artery-to-artery mechanism  4. Hypercoagulable state-related infarct  5. Transient ischemic attack  6. Thrombotic mechanism, large artery disease   Comments:   [facility] contacted: 1822 [facility] at bedside: 1827 NIHSS assessment time: same  Recommendations:  CTA H/N and CTA Chest because of low blood pressure(nursing understands why we are ordering a CTA) Inpatient neurology consultation Inpatient stroke evaluation as per Neurology/ Internal Medicine Discussed with ED MD Please call with questions  -----------------------------------------------------------------------------------------  CC: Stroke  History of Present Illness   Patient is a  52 y.o. that comes in with a hiostory of DM, schizophrenia, and stays in a group home. HE was looking dazed at about 5 pm, and comes in and might be aphasic, per his nursing home, who has known him for about one weak. He is not on any blood thinners. The nursing home cannot confirm or deny any history of bleeding in the brain. The patient denies, and states that he is fine. He states that he is in a mental health group home. I tried to call his emergency contact but was unable to get in contact with him for tpa consent. He has intellectual disabilty. The patient fluctuates between. The paitent even adamantly states 'I speak like this', 'This is the way I talk'.  There is no way to know if he has had a bleed in the brain.  I tried to call two numbers [phone], and [phone] without avail.   Diagnostic:  CT head is negate.  Exam: NIHSS - 1 - aphasia mild.  1A: Level of Consciousness -  Alert; keenly responsive 1B: Ask Month and Age - Both Questions Right 1C: 'Blink Eyes' & 'Squeeze Hands' - Performs Both Tasks 2: Test Horizontal Extraocular Movements - Normal 3: Test Visual Fields - No Visual Loss 4: Test Facial Palsy - Normal symmetry 5A: Test Left Arm Motor Drift - No Drift for 10 Seconds 5B: Test Right Arm Motor Drift - No  Drift for 10 Seconds 6A: Test Left Leg Motor Drift - No Drift for 5 Seconds 6B: Test Right Leg Motor Drift - No Drift for 5 Seconds 7: Test Limb Ataxia - Ataxia in 1 Limb 8: Test Sensation - Normal; No sensory loss 9: Test Language/Aphasia - Normal; No aphasia 10: Test Dysarthria - Normal 11: Test Extinction/Inattention - No abnormality        Medical Decision Making:  - Extensive number of diagnosis or management options are considered above.   - Extensive amount of complex data reviewed.   - High risk of complication and/or morbidity or mortality are associated with differential diagnostic considerations above.  - There may be Uncertain outcome and increased probability of prolonged functional impairment or high probability of severe prolonged functional impairment associated with some of these differential diagnosis.  Medical Data Reviewed:  1.Data reviewed include clinical labs, radiology,  Medical Tests;   2.Tests results discussed w/performing or interpreting physician;   3.Obtaining/reviewing old medical records;  4.Obtaining case history from another source;  5.Independent review of image, tracing or specimen.    Patient was informed the Neurology Consult would happen via telehealth (remote video) and consented to receiving care in this manner.

## 2017-11-15 NOTE — ED Provider Notes (Signed)
[facility] Emergency Department Provider Note    None    (approximate)  I have reviewed the triage vital signs and the nursing notes.   HISTORY  Chief Complaint Altered Mental Status and Hallucinations  Level V Caveat:  ams - poor historian  HPI Paul Hernandez is a 52 y.o. male with a history of diabetes and hypertension presents via EMS after the patient was with his group home caretaker and was started having abnormal speech and change in behavior..  EMS found the patient hypertensive and states that he was having trouble forming sentences.  No other deficits noted.  On arrival to the ER the patient is very paranoid and anxious appearing.  Looking about the room.  Denies any chest pain or numbness or tingling.  Able to ambulate about the room in no acute distress.  No facial droop.  History limited as the patient is a very poor historian.  EMS reports last seen normal 1 hr prior.   Past Medical History:  Diagnosis Date  . Diabetes mellitus without complication (HCC)   . Hypertension   . Mental health disorder    Family History  Problem Relation Age of Onset  . CVA Mother   . CAD Father    Past Surgical History:  Procedure Laterality Date  . NO PAST SURGERIES     Patient Active Problem List   Diagnosis Date Noted  . COPD (chronic obstructive pulmonary disease) (HCC) 11/15/2017  . Mental health disorder 11/15/2017  . Cerebral ischemia 11/15/2017  . Cerebral infarct (HCC) 11/15/2017  . Benign essential HTN 11/13/2017  . Diabetes type 2, controlled (HCC) 11/13/2017  . COPD exacerbation (HCC) 09/16/2017      Prior to Admission medications   Medication Sig Start Date End Date Taking? Authorizing Provider  acetaminophen (TYLENOL) 325 MG tablet Take 650 mg by mouth every 6 (six) hours as needed.   Yes [provider]  albuterol (PROVENTIL HFA;VENTOLIN HFA) 108 (90 Base) MCG/ACT inhaler Inhale 2 puffs into the lungs every 6 (six) hours as  needed for wheezing or shortness of breath. 08/13/17  Yes Summers, Rhonda L, PA-C  albuterol (PROVENTIL) (2.5 MG/3ML) 0.083% nebulizer solution Take 2.5 mg by nebulization 4 (four) times daily.   Yes [provider]  amLODipine-benazepril (LOTREL) 10-20 MG capsule Take 1 capsule by mouth every evening.    Yes [provider]  atorvastatin (LIPITOR) 40 MG tablet Take 40 mg by mouth at bedtime.    Yes [provider]  benztropine (COGENTIN) 2 MG tablet Take 2 mg by mouth at bedtime.   Yes [provider]  Cholecalciferol (VITAMIN D3) 5000 units CAPS Take 5,000 Units by mouth daily.   Yes [provider]  cloNIDine (CATAPRES) 0.3 MG tablet Take 0.3 mg by mouth every 8 (eight) hours.    Yes [provider]  dextrose (GLUTOSE) 40 % GEL Take by mouth once as needed for low blood sugar. For blood sugar <70   Yes [provider]  diphenhydrAMINE (BENADRYL) 25 mg capsule Take 25 mg by mouth at bedtime.    Yes [provider]  haloperidol decanoate (HALDOL DECANOATE) 100 MG/ML injection Inject 90 mg into the muscle every 28 (twenty-eight) days.    Yes [provider]  metFORMIN (GLUCOPHAGE) 500 MG tablet Take 1,000-1,500 mg by mouth daily with breakfast. Take 1500 mg in the morning and 1,000 mg at bedtime.   Yes [provider]  metoprolol (TOPROL-XL) 200 MG 24 hr  tablet Take 400 mg by mouth daily.    Yes [provider]  triamterene-hydrochlorothiazide (MAXZIDE) 75-50 MG tablet Take 1 tablet by mouth every morning.    Yes [provider]  amLODipine (NORVASC) 10 MG tablet Take 1 tablet (10 mg total) by mouth daily. Patient not taking: Reported on 09/16/2017 08/10/16 08/10/17  Minna Antis, MD    Allergies [name] [aspirin]    Social History Social History   Tobacco Use  . Smoking status: Current Some Day Smoker    Packs/day: 0.50  . Smokeless tobacco: Current User  Substance Use Topics    . Alcohol use: No  . Drug use: No    Review of Systems Patient denies headaches, rhinorrhea, blurry vision, numbness, shortness of breath, chest pain, edema, cough, abdominal pain, nausea, vomiting, diarrhea, dysuria, fevers, rashes or hallucinations unless otherwise stated above in HPI. ____________________________________________   PHYSICAL EXAM:  VITAL SIGNS: Vitals:   11/15/17 2042 11/15/17 2130  BP:  (!) 146/115  Pulse:  98  Resp:  (!) 27  Temp: 98.2 F (36.8 C)   SpO2:  97%    Constitutional: Alert in no acute distress Eyes: Conjunctivae are normal.  Head: Atraumatic. Nose: No congestion/rhinnorhea. Mouth/Throat: Mucous membranes are moist.   Neck: No stridor. Painless ROM.  Cardiovascular: Normal rate, regular rhythm. Grossly normal heart sounds.  Good peripheral circulation. Respiratory: Normal respiratory effort.  No retractions. Lungs CTAB. Gastrointestinal: Soft and nontender. No distention. No abdominal bruits. No CVA tenderness. Genitourinary:  Musculoskeletal: No lower extremity tenderness nor edema.  No joint effusions. Neurologic:  CN- intact.  No facial droop, Normal FNF.  Normal heel to shin.  Sensation intact bilaterally.  No gross focal neurologic deficits are appreciated. No gait instability.  Patient able to complete words with no slurred speech but seems to have some component of thought blocking or lack of fluency.  Able to follow one and two-step commands. Skin:  Skin is warm, dry and intact. No rash noted. Psychiatric: admits to hallucinations, appears paranoid ____________________________________________   LABS (all labs ordered are listed, but only abnormal results are displayed)  Results for orders placed or performed during the hospital encounter of 11/15/17 (from the past 24 hour(s))  Ethanol     Status: None   Collection Time: 11/15/17  6:19 PM  Result Value Ref Range   Alcohol, Ethyl (B) <10 <10 mg/dL  Protime-INR     Status: None    Collection Time: 11/15/17  6:19 PM  Result Value Ref Range   Prothrombin Time 13.0 11.4 - 15.2 seconds   INR 0.99   APTT     Status: None   Collection Time: 11/15/17  6:19 PM  Result Value Ref Range   aPTT 33 24 - 36 seconds  CBC     Status: Abnormal   Collection Time: 11/15/17  6:19 PM  Result Value Ref Range   WBC 4.9 3.8 - 10.6 K/uL   RBC 5.39 4.40 - 5.90 MIL/uL   Hemoglobin 15.7 13.0 - 18.0 g/dL   HCT 16.1 09.6 - 04.5 %   MCV 88.0 80.0 - 100.0 fL   MCH 29.1 26.0 - 34.0 pg   MCHC 33.1 32.0 - 36.0 g/dL   RDW 40.9 (H) 81.1 - 91.4 %   Platelets 291 150 - 440 K/uL  Differential     Status: Abnormal   Collection Time: 11/15/17  6:19 PM  Result Value Ref Range   Neutrophils Relative % 73 %   Neutro Abs 3.6  1.4 - 6.5 K/uL   Lymphocytes Relative 16 %   Lymphs Abs 0.8 (L) 1.0 - 3.6 K/uL   Monocytes Relative 9 %   Monocytes Absolute 0.4 0.2 - 1.0 K/uL   Eosinophils Relative 1 %   Eosinophils Absolute 0.0 0 - 0.7 K/uL   Basophils Relative 1 %   Basophils Absolute 0.0 0 - 0.1 K/uL  Comprehensive metabolic panel     Status: Abnormal   Collection Time: 11/15/17  6:19 PM  Result Value Ref Range   Sodium 140 135 - 145 mmol/L   Potassium 3.9 3.5 - 5.1 mmol/L   Chloride 104 101 - 111 mmol/L   CO2 27 22 - 32 mmol/L   Glucose, Bld 190 (H) 65 - 99 mg/dL   BUN 15 6 - 20 mg/dL   Creatinine, Ser 1.610.97 0.61 - 1.24 mg/dL   Calcium 9.3 8.9 - 09.610.3 mg/dL   Total Protein 7.4 6.5 - 8.1 g/dL   Albumin 4.4 3.5 - 5.0 g/dL   AST 20 15 - 41 U/L   ALT 14 (L) 17 - 63 U/L   Alkaline Phosphatase 72 38 - 126 U/L   Total Bilirubin 0.6 0.3 - 1.2 mg/dL   GFR calc non Af Amer >60 >60 mL/min   GFR calc Af Amer >60 >60 mL/min   Anion gap 9 5 - 15  Troponin I     Status: Abnormal   Collection Time: 11/15/17  6:19 PM  Result Value Ref Range   Troponin I 0.04 (HH) <0.03 ng/mL  Blood gas, venous     Status: Abnormal   Collection Time: 11/15/17  6:19 PM  Result Value Ref Range   pH, Ven 7.36 7.250 - 7.430     pCO2, Ven 51 44.0 - 60.0 mmHg   pO2, Ven 33.0 32.0 - 45.0 mmHg   Bicarbonate 28.8 (H) 20.0 - 28.0 mmol/L   Acid-Base Excess 2.1 (H) 0.0 - 2.0 mmol/L   O2 Saturation 60.6 %   Patient temperature 37.0    Collection site VEIN    Sample type VENOUS    ____________________________________________  EKG My review and personal interpretation at Time: 18:34   Indication: cva  Rate: 100  Rhythm: sinus Axis: normal Other: normal intervals, no stemi,  Nonspecific st changes ____________________________________________  RADIOLOGY  I personally reviewed all radiographic images ordered to evaluate for the above acute complaints and reviewed radiology reports and findings.  These findings were personally discussed with the patient.  Please see medical record for radiology report.  ____________________________________________   PROCEDURES  Procedure(s) performed:  .Critical Care Performed by: Willy Eddyobinson, Quandarius Nill, MD Authorized by: Willy Eddyobinson, Kamari Buch, MD   Critical care provider statement:    Critical care time (minutes):  30   Critical care time was exclusive of:  Separately billable procedures and treating other patients   Critical care was necessary to treat or prevent imminent or life-threatening deterioration of the following conditions:  CNS failure or compromise   Critical care was time spent personally by me on the following activities:  Development of treatment plan with patient or surrogate, discussions with consultants, evaluation of patient's response to treatment, examination of patient, obtaining history from patient or surrogate, ordering and performing treatments and interventions, ordering and review of laboratory studies, ordering and review of radiographic studies, pulse oximetry, re-evaluation of patient's condition and review of old charts      Critical Care performed: yes ____________________________________________   INITIAL IMPRESSION / ASSESSMENT AND PLAN / ED  COURSE  Pertinent labs & imaging results that were available during my care of the patient were reviewed by me and considered in my medical decision making (see chart for details).  DDX: cva, tia, hypoglycemia, psychosis, withdrawal dehydration, electrolyte abnormality, dissection, sepsis    Paul Hernandez is a 52 y.o. who presents to the ED with above described presentation.  He is afebrile, mildly tachycardic and in no acute distress.  Does seem to be having difficulty forming words.  Concerning for thought blocking versus some component of expressive aphasia.  Patient lives with a group home but I do not identify any evidence of documented psychiatric history.  Patient does appear very paranoid and anxious.  As it does seem like it was fairly rapid onset will have patient evaluated by tele-neurology as I am concern for stroke.  Will also provide IM Haldol in the interim.  Taken emergently to CT head.  The patient will be placed on continuous pulse oximetry and telemetry for monitoring.  Laboratory evaluation will be sent to evaluate for the above complaints.     Clinical Course as of Nov 15 2248  Thu Nov 15, 2017  1901 Patient evaluated by neurology at bedside.  This point neither is feel the patient is a consentable.  A very mixed picture.  Spoke with group home and they state they only know the patient for the past 11 days and this is certainly abnormal behavior for him but do not know of any underlying mental illness.  Patient is becoming more agitated.  Will give small dose of haldol and versed so that we can more appropriately medically evaluate.  [PR]  2025 She does have evidence of ischemia on CT perfusion stenosis or occlusion.  Again while the patient will be a candidate for TPA I cannot verify that he does not have any exclusion criteria due to his underlying schizophrenia and lack of family or POA at bedside to consent and provide any additional history to rule out previous history of  bleed.  On reevaluation his deficits seem very minimal and he does seem to have regained some fluency of speech.  Discussion with telemetry neurology I do not believe the patient is consentable and I do not believe that we can safely or ethically administer TPA at this time.  Will give aspirin give IV fluids and admit for medical evaluation.  [PR]    Clinical Course User Index [PR] Willy Eddy, MD     ____________________________________________   FINAL CLINICAL IMPRESSION(S) / ED DIAGNOSES  Final diagnoses:  Altered mental status, unspecified altered mental status type  Other speech disturbance      NEW MEDICATIONS STARTED DURING THIS VISIT:  Current Discharge Medication List       Note:  This document was prepared using Dragon voice recognition software and may include unintentional dictation errors.    Willy Eddy, MD 11/15/17 2250

## 2017-11-15 NOTE — H&P (Signed)
[facility] - [place] at [facility]   PATIENT NAME: Paul Hernandez    MR#:  [id number]  DATE OF BIRTH:  1966/09/06  DATE OF ADMISSION:  11/15/2017  PRIMARY CARE PHYSICIAN: Anselm Jungling, NP   REQUESTING/REFERRING PHYSICIAN: Roxan Hockey, MD  CHIEF COMPLAINT:   Chief Complaint  Patient presents with  . Altered Mental Status  . Hallucinations    HISTORY OF PRESENT ILLNESS:  Paul Hernandez  is a 52 y.o. male who presents from group home with concern for possible aphasia.  Report by group home worker is that the patient was at the grocery store with worker and had an acute "change" in his mental status and his speech.  Group home was unable to provide much more useful information on the matter.  But here the patient is unable to contribute much information to his own HPI given his baseline mental health issues.  CT angiogram imaging done here in the ED shows watershed region ischemia.  Hospitalists were called for admission.  PAST MEDICAL HISTORY:   Past Medical History:  Diagnosis Date  . Diabetes mellitus without complication (HCC)   . Hypertension   . Mental health disorder     PAST SURGICAL HISTORY:   Past Surgical History:  Procedure Laterality Date  . NO PAST SURGERIES      SOCIAL HISTORY:   Social History   Tobacco Use  . Smoking status: Current Some Day Smoker    Packs/day: 0.50  . Smokeless tobacco: Current User  Substance Use Topics  . Alcohol use: No    FAMILY HISTORY:   Family History  Problem Relation Age of Onset  . CVA Mother   . CAD Father     DRUG ALLERGIES:   Allergies  Allergen Reactions  . Asa [Aspirin]     MEDICATIONS AT HOME:   Prior to Admission medications   Medication Sig Start Date End Date Taking? Authorizing Provider  acetaminophen (TYLENOL) 325 MG tablet Take 650 mg by mouth every 6 (six) hours as needed.   Yes [provider]  albuterol (PROVENTIL HFA;VENTOLIN HFA) 108 (90 Base) MCG/ACT  inhaler Inhale 2 puffs into the lungs every 6 (six) hours as needed for wheezing or shortness of breath. 08/13/17  Yes Summers, Rhonda L, PA-C  albuterol (PROVENTIL) (2.5 MG/3ML) 0.083% nebulizer solution Take 2.5 mg by nebulization 4 (four) times daily.   Yes [provider]  amLODipine-benazepril (LOTREL) 10-20 MG capsule Take 1 capsule by mouth every evening.    Yes [provider]  atorvastatin (LIPITOR) 40 MG tablet Take 40 mg by mouth at bedtime.    Yes [provider]  benztropine (COGENTIN) 2 MG tablet Take 2 mg by mouth at bedtime.   Yes [provider]  Cholecalciferol (VITAMIN D3) 5000 units CAPS Take 5,000 Units by mouth daily.   Yes [provider]  cloNIDine (CATAPRES) 0.3 MG tablet Take 0.3 mg by mouth every 8 (eight) hours.    Yes [provider]  dextrose (GLUTOSE) 40 % GEL Take by mouth once as needed for low blood sugar. For blood sugar <70   Yes [provider]  diphenhydrAMINE (BENADRYL) 25 mg capsule Take 25 mg by mouth at bedtime.    Yes [provider]  haloperidol decanoate (HALDOL DECANOATE) 100 MG/ML injection Inject 90 mg into the muscle every 28 (twenty-eight) days.    Yes [provider]  metFORMIN (GLUCOPHAGE) 500 MG tablet Take 1,000-1,500 mg by mouth daily with breakfast. Take 1500  mg in the morning and 1,000 mg at bedtime.   Yes [provider]  metoprolol (TOPROL-XL) 200 MG 24 hr tablet Take 400 mg by mouth daily.    Yes [provider]  triamterene-hydrochlorothiazide (MAXZIDE) 75-50 MG tablet Take 1 tablet by mouth every morning.    Yes [provider]  amLODipine (NORVASC) 10 MG tablet Take 1 tablet (10 mg total) by mouth daily. Patient not taking: Reported on 09/16/2017 08/10/16 08/10/17  Minna Antis, MD    REVIEW OF SYSTEMS:  Review of Systems  Unable to perform ROS: Psychiatric disorder     VITAL SIGNS:   Vitals:   11/15/17 1806 11/15/17  1807 11/15/17 1900 11/15/17 2042  BP: 100/69  (!) 164/110   Pulse: (!) 104  (!) 109   Resp: (!) 22  (!) 23   Temp: 98.2 F (36.8 C)   98.2 F (36.8 C)  TempSrc: Oral     SpO2: 96%  96%   Weight:  106.6 kg (235 lb)    Height:  6\' 1"  (1.854 m)     Wt Readings from Last 3 Encounters:  11/15/17 106.6 kg (235 lb)  10/25/17 106.6 kg (235 lb)  09/16/17 106.1 kg (234 lb)    PHYSICAL EXAMINATION:  Physical Exam  Vitals reviewed. Constitutional: He appears well-developed and well-nourished. No distress.  HENT:  Head: Normocephalic and atraumatic.  Mouth/Throat: Oropharynx is clear and moist.  Eyes: Conjunctivae and EOM are normal. Pupils are equal, round, and reactive to light. No scleral icterus.  Neck: Normal range of motion. Neck supple. No JVD present. No thyromegaly present.  Cardiovascular: Normal rate, regular rhythm and intact distal pulses. Exam reveals no gallop and no friction rub.  No murmur heard. Respiratory: Effort normal and breath sounds normal. No respiratory distress. He has no wheezes. He has no rales.  GI: Soft. Bowel sounds are normal. He exhibits no distension. There is no tenderness.  Musculoskeletal: Normal range of motion. He exhibits no edema.  No arthritis, no gout  Lymphadenopathy:    He has no cervical adenopathy.  Neurological: He is alert. No cranial nerve deficit.  Neurologic: Cranial nerves II-XII intact, Sensation intact to light touch/pinprick, 5/5 strength in all extremities, no dysarthria, difficult to assess aphasia -as patient is able to converse, though his thoughts are somewhat scattered, and he does at times appear to have some word finding difficulty, though it is difficult to say if this is due to his mental health condition or a new symptom related to his ischemia, no dysphagia  Skin: Skin is warm and dry. No rash noted. No erythema.  Psychiatric:  Unable to fully assess.  Patient's speech is confused, and thought content is perhaps somewhat  delusional    LABORATORY PANEL:   CBC Recent Labs  Lab 11/15/17 1819  WBC 4.9  HGB 15.7  HCT 47.4  PLT 291   ------------------------------------------------------------------------------------------------------------------  Chemistries  Recent Labs  Lab 11/15/17 1819  NA 140  K 3.9  CL 104  CO2 27  GLUCOSE 190*  BUN 15  CREATININE 0.97  CALCIUM 9.3  AST 20  ALT 14*  ALKPHOS 72  BILITOT 0.6   ------------------------------------------------------------------------------------------------------------------  Cardiac Enzymes Recent Labs  Lab 11/15/17 1819  TROPONINI 0.04*   ------------------------------------------------------------------------------------------------------------------  RADIOLOGY:  Ct Angio Head W Or Wo Contrast  Result Date: 11/15/2017 CLINICAL DATA:  Altered mental status. EXAM: CT ANGIOGRAPHY HEAD AND NECK CT PERFUSION BRAIN TECHNIQUE: Multidetector CT imaging of the head and neck was performed  using the standard protocol during bolus administration of intravenous contrast. Multiplanar CT image reconstructions and MIPs were obtained to evaluate the vascular anatomy. Carotid stenosis measurements (when applicable) are obtained utilizing NASCET criteria, using the distal internal carotid diameter as the denominator. Multiphase CT imaging of the brain was performed following IV bolus contrast injection. Subsequent parametric perfusion maps were calculated using RAPID software. CONTRAST:  100mL ISOVUE-370 IOPAMIDOL (ISOVUE-370) INJECTION 76% COMPARISON:  None. FINDINGS: CTA NECK FINDINGS Aortic arch: There is minimal calcific atherosclerosis of the aortic arch. There is no aneurysm, dissection or hemodynamically significant stenosis of the visualized ascending aorta and aortic arch. Conventional 3 vessel aortic branching pattern. The visualized proximal subclavian arteries are normal. Right carotid system: The right common carotid origin is widely patent.  There is no common carotid or internal carotid artery dissection or aneurysm. No hemodynamically significant stenosis. Left carotid system: The left common carotid origin is widely patent. There is no common carotid or internal carotid artery dissection or aneurysm. No hemodynamically significant stenosis. Vertebral arteries: The vertebral system is left dominant. Both vertebral artery origins are normal. Both vertebral arteries are normal to their confluence with the basilar artery. Skeleton: There is no bony spinal canal stenosis. No lytic or blastic lesions. Other neck: The nasopharynx is clear. The oropharynx and hypopharynx are normal. The epiglottis is normal. The supraglottic larynx, glottis and subglottic larynx are normal. No retropharyngeal collection. The parapharyngeal spaces are preserved. The parotid and submandibular glands are normal. No sialolithiasis or salivary ductal dilatation. The thyroid gland is normal. There is no cervical lymphadenopathy. Upper chest: No pneumothorax or pleural effusion. No nodules or masses. Review of the MIP images confirms the above findings CTA HEAD FINDINGS Anterior circulation: --Intracranial internal carotid arteries: Normal. --Anterior cerebral arteries: Normal. --Middle cerebral arteries: Normal. --Posterior communicating arteries: Absent bilaterally. Posterior circulation: --Posterior cerebral arteries: Normal. --Superior cerebellar arteries: Normal. --Basilar artery: Normal. --Anterior inferior cerebellar arteries: Normal. --Posterior inferior cerebellar arteries: Normal. Venous sinuses: As permitted by contrast timing, patent. Anatomic variants: None Delayed phase: No parenchymal contrast enhancement. Review of the MIP images confirms the above findings. CT Brain Perfusion Findings: CBF (<30%) Volume: 0mL Perfusion (Tmax>6.0s) volume: 48mL Mismatch Volume: 48mL Ischemia location:Bilateral deep watershed distribution IMPRESSION: 1. No emergent large vessel  occlusion or high-grade stenosis. 2. Acute ischemia within the bilateral deep watershed zones. No core infarct per perfusion criteria 3.  Aortic Atherosclerosis (ICD10-I70.0). Electronically Signed   By: Deatra RobinsonKevin  Herman M.D.   On: 11/15/2017 20:10   Ct Head Wo Contrast  Result Date: 11/15/2017 CLINICAL DATA:  Altered mental status for a few hours with altered speech EXAM: CT HEAD WITHOUT CONTRAST TECHNIQUE: Contiguous axial images were obtained from the base of the skull through the vertex without intravenous contrast. COMPARISON:  None. FINDINGS: Brain: No evidence of acute infarction, hemorrhage, hydrocephalus, extra-axial collection or mass lesion/mass effect. Vascular: No hyperdense vessel or unexpected calcification. Skull: Normal. Negative for fracture or focal lesion. Sinuses/Orbits: No acute finding. Other: None. IMPRESSION: No acute abnormality noted. Electronically Signed   By: Alcide CleverMark  Lukens M.D.   On: 11/15/2017 18:37   Ct Angio Neck W Or Wo Contrast  Result Date: 11/15/2017 CLINICAL DATA:  Altered mental status. EXAM: CT ANGIOGRAPHY HEAD AND NECK CT PERFUSION BRAIN TECHNIQUE: Multidetector CT imaging of the head and neck was performed using the standard protocol during bolus administration of intravenous contrast. Multiplanar CT image reconstructions and MIPs were obtained to evaluate the vascular anatomy. Carotid stenosis measurements (when applicable) are obtained  utilizing NASCET criteria, using the distal internal carotid diameter as the denominator. Multiphase CT imaging of the brain was performed following IV bolus contrast injection. Subsequent parametric perfusion maps were calculated using RAPID software. CONTRAST:  ISOVUE-370 IOPAMIDOL (ISOVUE-370) INJECTION 76% COMPARISON:  None. FINDINGS: CTA NECK FINDINGS Aortic arch: There is minimal calcific atherosclerosis of the aortic arch. There is no aneurysm, dissection or hemodynamically significant stenosis of the visualized ascending  aorta and aortic arch. Conventional 3 vessel aortic branching pattern. The visualized proximal subclavian arteries are normal. Right carotid system: The right common carotid origin is widely patent. There is no common carotid or internal carotid artery dissection or aneurysm. No hemodynamically significant stenosis. Left carotid system: The left common carotid origin is widely patent. There is no common carotid or internal carotid artery dissection or aneurysm. No hemodynamically significant stenosis. Vertebral arteries: The vertebral system is left dominant. Both vertebral artery origins are normal. Both vertebral arteries are normal to their confluence with the basilar artery. Skeleton: There is no bony spinal canal stenosis. No lytic or blastic lesions. Other neck: The nasopharynx is clear. The oropharynx and hypopharynx are normal. The epiglottis is normal. The supraglottic larynx, glottis and subglottic larynx are normal. No retropharyngeal collection. The parapharyngeal spaces are preserved. The parotid and submandibular glands are normal. No sialolithiasis or salivary ductal dilatation. The thyroid gland is normal. There is no cervical lymphadenopathy. Upper chest: No pneumothorax or pleural effusion. No nodules or masses. Review of the MIP images confirms the above findings CTA HEAD FINDINGS Anterior circulation: --Intracranial internal carotid arteries: Normal. --Anterior cerebral arteries: Normal. --Middle cerebral arteries: Normal. --Posterior communicating arteries: Absent bilaterally. Posterior circulation: --Posterior cerebral arteries: Normal. --Superior cerebellar arteries: Normal. --Basilar artery: Normal. --Anterior inferior cerebellar arteries: Normal. --Posterior inferior cerebellar arteries: Normal. Venous sinuses: As permitted by contrast timing, patent. Anatomic variants: None Delayed phase: No parenchymal contrast enhancement. Review of the MIP images confirms the above findings. CT Brain  Perfusion Findings: CBF (<30%) Volume: 0mL Perfusion (Tmax>6.0s) volume: 48mL Mismatch Volume: 48mL Ischemia location:Bilateral deep watershed distribution IMPRESSION: 1. No emergent large vessel occlusion or high-grade stenosis. 2. Acute ischemia within the bilateral deep watershed zones. No core infarct per perfusion criteria 3.  Aortic Atherosclerosis (ICD10-I70.0). Electronically Signed   By: Deatra Robinson M.D.   On: 11/15/2017 20:10   Ct Cerebral Perfusion W Contrast  Result Date: 11/15/2017 CLINICAL DATA:  Altered mental status. EXAM: CT ANGIOGRAPHY HEAD AND NECK CT PERFUSION BRAIN TECHNIQUE: Multidetector CT imaging of the head and neck was performed using the standard protocol during bolus administration of intravenous contrast. Multiplanar CT image reconstructions and MIPs were obtained to evaluate the vascular anatomy. Carotid stenosis measurements (when applicable) are obtained utilizing NASCET criteria, using the distal internal carotid diameter as the denominator. Multiphase CT imaging of the brain was performed following IV bolus contrast injection. Subsequent parametric perfusion maps were calculated using RAPID software. CONTRAST:  ISOVUE-370 IOPAMIDOL (ISOVUE-370) INJECTION 76% COMPARISON:  None. FINDINGS: CTA NECK FINDINGS Aortic arch: There is minimal calcific atherosclerosis of the aortic arch. There is no aneurysm, dissection or hemodynamically significant stenosis of the visualized ascending aorta and aortic arch. Conventional 3 vessel aortic branching pattern. The visualized proximal subclavian arteries are normal. Right carotid system: The right common carotid origin is widely patent. There is no common carotid or internal carotid artery dissection or aneurysm. No hemodynamically significant stenosis. Left carotid system: The left common carotid origin is widely patent. There is no common carotid or internal carotid  artery dissection or aneurysm. No hemodynamically significant  stenosis. Vertebral arteries: The vertebral system is left dominant. Both vertebral artery origins are normal. Both vertebral arteries are normal to their confluence with the basilar artery. Skeleton: There is no bony spinal canal stenosis. No lytic or blastic lesions. Other neck: The nasopharynx is clear. The oropharynx and hypopharynx are normal. The epiglottis is normal. The supraglottic larynx, glottis and subglottic larynx are normal. No retropharyngeal collection. The parapharyngeal spaces are preserved. The parotid and submandibular glands are normal. No sialolithiasis or salivary ductal dilatation. The thyroid gland is normal. There is no cervical lymphadenopathy. Upper chest: No pneumothorax or pleural effusion. No nodules or masses. Review of the MIP images confirms the above findings CTA HEAD FINDINGS Anterior circulation: --Intracranial internal carotid arteries: Normal. --Anterior cerebral arteries: Normal. --Middle cerebral arteries: Normal. --Posterior communicating arteries: Absent bilaterally. Posterior circulation: --Posterior cerebral arteries: Normal. --Superior cerebellar arteries: Normal. --Basilar artery: Normal. --Anterior inferior cerebellar arteries: Normal. --Posterior inferior cerebellar arteries: Normal. Venous sinuses: As permitted by contrast timing, patent. Anatomic variants: None Delayed phase: No parenchymal contrast enhancement. Review of the MIP images confirms the above findings. CT Brain Perfusion Findings: CBF (<30%) Volume: 0mL Perfusion (Tmax>6.0s) volume: 48mL Mismatch Volume: 48mL Ischemia location:Bilateral deep watershed distribution IMPRESSION: 1. No emergent large vessel occlusion or high-grade stenosis. 2. Acute ischemia within the bilateral deep watershed zones. No core infarct per perfusion criteria 3.  Aortic Atherosclerosis (ICD10-I70.0). Electronically Signed   By: Deatra Robinson M.D.   On: 11/15/2017 20:10    EKG:   Orders placed or performed during the  hospital encounter of 11/15/17  . ED EKG  . ED EKG  . EKG 12-Lead  . EKG 12-Lead    IMPRESSION AND PLAN:  Principal Problem:   Cerebral ischemia -CT head shows watershed area lesions.  Will admit per stroke admission order set with corresponding labs, imaging, consults Active Problems:   Benign essential HTN -permissive hypertension, goal blood pressure less than 220/120, hold antihypertensives for now   Diabetes type 2, controlled (HCC) -siding scale insulin with corresponding glucose checks   Mental health disorder -unclear if his mental health disorder is schizophrenia or some other diagnosis.  Chart review did not elicit more information on this.  Psychiatry consult in place.  Patient is currently IVC  All the records are reviewed and case discussed with ED provider. Management plans discussed with the patient and/or family.  DVT PROPHYLAXIS: SubQ lovenox  GI PROPHYLAXIS: None  ADMISSION STATUS: Inpatient  CODE STATUS: Full Code Status History    Date Active Date Inactive Code Status Order ID Comments User Context   09/16/2017 15:34 09/18/2017 14:40 Full Code [id number]  Alford Highland, MD ED      TOTAL TIME TAKING CARE OF THIS PATIENT: 45 minutes.   Chapel Silverthorn FIELDING 11/15/2017, 9:31 PM  [facility]  Office  [phone]  CC: Primary care physician; Anselm Jungling, NP  Note:  This document was prepared using Dragon voice recognition software and may include unintentional dictation errors.

## 2017-11-15 NOTE — ED Triage Notes (Signed)
Pt bib [facility] from [facility] for altered mental status. Altered mental status began approx 1715, pt speech altered

## 2017-11-15 NOTE — ED Notes (Signed)
Attempted to call report at this time, RN unable to take report at this time and will call back.

## 2017-11-15 NOTE — ED Notes (Signed)
PT  PLACED  UNDER  IVC PER  DR  Roxan HockeyOBINSON MD  INFORMED  JANE  CHARGE NURSE  AND ALLISON  RN  AND ODS  OFFICER  TOMMY

## 2017-11-16 ENCOUNTER — Other Ambulatory Visit: Payer: Self-pay

## 2017-11-16 ENCOUNTER — Inpatient Hospital Stay
Admit: 2017-11-16 | Discharge: 2017-11-16 | Disposition: A | Discharge status: Home / Self Care | Payer: Medicare Other | Attending: Internal Medicine | Admitting: Internal Medicine

## 2017-11-16 ENCOUNTER — Inpatient Hospital Stay: Payer: Medicare Other

## 2017-11-16 DIAGNOSIS — R0602 Shortness of breath: Secondary | ICD-10-CM | POA: Diagnosis not present

## 2017-11-16 DIAGNOSIS — I6340 Cerebral infarction due to embolism of unspecified cerebral artery: Secondary | ICD-10-CM | POA: Diagnosis not present

## 2017-11-16 DIAGNOSIS — I639 Cerebral infarction, unspecified: Secondary | ICD-10-CM

## 2017-11-16 DIAGNOSIS — F203 Undifferentiated schizophrenia: Secondary | ICD-10-CM | POA: Diagnosis not present

## 2017-11-16 DIAGNOSIS — F209 Schizophrenia, unspecified: Secondary | ICD-10-CM

## 2017-11-16 LAB — CBC
HCT: 44.2 % (ref 40.0–52.0)
Hemoglobin: 14.3 g/dL (ref 13.0–18.0)
MCH: 28.6 pg (ref 26.0–34.0)
MCHC: 32.4 g/dL (ref 32.0–36.0)
MCV: 88.5 fL (ref 80.0–100.0)
PLATELETS: 257 10*3/uL (ref 150–440)
RBC: 5 MIL/uL (ref 4.40–5.90)
RDW: 15.1 % — AB (ref 11.5–14.5)
WBC: 5 10*3/uL (ref 3.8–10.6)

## 2017-11-16 LAB — URINE DRUG SCREEN, QUALITATIVE (ARMC ONLY)
AMPHETAMINES, UR SCREEN: NOT DETECTED
BENZODIAZEPINE, UR SCRN: POSITIVE — AB
Barbiturates, Ur Screen: NOT DETECTED
COCAINE METABOLITE, UR ~~LOC~~: NOT DETECTED
Cannabinoid 50 Ng, Ur ~~LOC~~: NOT DETECTED
MDMA (ECSTASY) UR SCREEN: NOT DETECTED
METHADONE SCREEN, URINE: NOT DETECTED
OPIATE, UR SCREEN: NOT DETECTED
PHENCYCLIDINE (PCP) UR S: NOT DETECTED
Tricyclic, Ur Screen: NOT DETECTED

## 2017-11-16 LAB — URINALYSIS, ROUTINE W REFLEX MICROSCOPIC
Bilirubin Urine: NEGATIVE
GLUCOSE, UA: NEGATIVE mg/dL
HGB URINE DIPSTICK: NEGATIVE
Ketones, ur: NEGATIVE mg/dL
LEUKOCYTES UA: NEGATIVE
Nitrite: NEGATIVE
PH: 6 (ref 5.0–8.0)
PROTEIN: NEGATIVE mg/dL
SPECIFIC GRAVITY, URINE: 1.034 — AB (ref 1.005–1.030)

## 2017-11-16 LAB — ECHOCARDIOGRAM COMPLETE
HEIGHTINCHES: 71 in
Weight: 3620.8 oz

## 2017-11-16 LAB — LIPID PANEL
CHOL/HDL RATIO: 4.5 ratio
CHOLESTEROL: 188 mg/dL (ref 0–200)
HDL: 42 mg/dL (ref >40)
LDL Cholesterol: 132 mg/dL — ABNORMAL HIGH (ref 0–99)
Triglycerides: 72 mg/dL (ref <150)
VLDL: 14 mg/dL (ref 0–40)

## 2017-11-16 LAB — GLUCOSE, CAPILLARY
GLUCOSE-CAPILLARY: 95 mg/dL (ref 65–99)
Glucose-Capillary: 130 mg/dL — ABNORMAL HIGH (ref 65–99)
Glucose-Capillary: 164 mg/dL — ABNORMAL HIGH (ref 65–99)

## 2017-11-16 LAB — MRSA PCR SCREENING: MRSA by PCR: NEGATIVE

## 2017-11-16 LAB — TROPONIN I
TROPONIN I: 0.04 ng/mL — AB (ref <0.03)
Troponin I: 0.04 ng/mL (ref <0.03)

## 2017-11-16 LAB — HEMOGLOBIN A1C
Hgb A1c MFr Bld: 7.9 % — ABNORMAL HIGH (ref 4.8–5.6)
MEAN PLASMA GLUCOSE: 180.03 mg/dL

## 2017-11-16 MED ORDER — CARVEDILOL 25 MG PO TABS: 25.0000 mg | ORAL_TABLET | Freq: Two times a day (BID) | ORAL | 0 refills | Status: DC

## 2017-11-16 MED ORDER — METOPROLOL SUCCINATE ER 50 MG PO TB24
100.0000 mg | ORAL_TABLET | Freq: Every day | ORAL | Status: DC
  Administered 2017-11-16: 100 mg via ORAL
  Filled 2017-11-16: qty 2

## 2017-11-16 MED ORDER — CLONIDINE HCL 0.1 MG PO TABS
0.3000 mg | ORAL_TABLET | Freq: Three times a day (TID) | ORAL | Status: DC
  Administered 2017-11-16: 0.3 mg via ORAL
  Filled 2017-11-16: qty 3

## 2017-11-16 MED ORDER — BENAZEPRIL HCL 20 MG PO TABS
20.0000 mg | ORAL_TABLET | Freq: Every day | ORAL | Status: DC
  Filled 2017-11-16: qty 1

## 2017-11-16 MED ORDER — CARVEDILOL 25 MG PO TABS: 25.0000 mg | ORAL_TABLET | Freq: Two times a day (BID) | ORAL | Status: DC

## 2017-11-16 MED ORDER — ASPIRIN EC 325 MG PO TBEC: 325.0000 mg | DELAYED_RELEASE_TABLET | Freq: Every day | ORAL | 0 refills | Status: DC

## 2017-11-16 MED ORDER — VITAMIN D 1000 UNITS PO TABS: 5000.0000 [IU] | ORAL_TABLET | Freq: Every day | ORAL | Status: DC

## 2017-11-16 MED ORDER — ALBUTEROL SULFATE (2.5 MG/3ML) 0.083% IN NEBU: 2.5000 mg | INHALATION_SOLUTION | Freq: Four times a day (QID) | RESPIRATORY_TRACT | Status: DC | PRN

## 2017-11-16 MED ORDER — BENAZEPRIL HCL 20 MG PO TABS: 20.0000 mg | ORAL_TABLET | Freq: Every day | ORAL | 1 refills | Status: DC

## 2017-11-16 MED ORDER — ALBUTEROL SULFATE HFA 108 (90 BASE) MCG/ACT IN AERS: 2.0000 | INHALATION_SPRAY | Freq: Four times a day (QID) | RESPIRATORY_TRACT | Status: DC | PRN

## 2017-11-16 NOTE — Progress Notes (Signed)
Pt to be discharged back to group home today. Iv and tele removed. Packet and prescrips to accompany him. Up ambulating at length in hallway and tolerating it well.

## 2017-11-16 NOTE — Consult Note (Signed)
[facility] Face-to-Face Psychiatry Consult   Reason for Consult: Consult for 52 year old man who came to the hospital with altered mental status possible stroke.  Concern about his chronic mental health issues Referring Physician: Posey Pronto Patient Identification: Paul Hernandez MRN:  [id number] Principal Diagnosis: Schizophrenia [facility]) Diagnosis:   Patient Active Problem List   Diagnosis Date Noted  . Schizophrenia ([place]) [F20.9] 11/16/2017  . COPD (chronic obstructive pulmonary disease) ([place]) [J44.9] 11/15/2017  . Mental health disorder [F99] 11/15/2017  . Cerebral ischemia [I67.82] 11/15/2017  . Cerebral infarct ([place]) [I63.9] 11/15/2017  . Benign essential HTN [I10] 11/13/2017  . Diabetes type 2, controlled ([place]) [E11.9] 11/13/2017  . COPD exacerbation ([place]) [J44.1] 09/16/2017    Total Time spent with patient: 1 hour  Subjective:   Paul Hernandez is a 52 y.o. male patient admitted with "they said that I had a stroke or something".  HPI: Patient interviewed chart reviewed.  52 year old man who presented to the emergency room with reports of altered mental status.  Descriptions are sparse but it sounds like he was having trouble communicating when he first presented and that people who know him he noticed an acute change in his behavior and mental state.  Complete CT scans showed some evidence of ischemia and the MRI today shows a small stroke.  Patient however is now asymptomatic.  He has only vague memories of coming to the hospital last night.  He cannot really describe what symptoms he was having at that point.  Today he denies having any difficulty or change in his speech or thinking.  Denies any mood problems.  Denies any hallucinations.  Denies any physical complaints.  Patient is pleasant in his affect.  No sign of any agitation or dangerousness.  He indicates that he is compliant with his chronic medication.  Social history: Reportedly has a guardian.  He lives in a group home.  He says he is  only been there a short time and was living somewhere else previously.  Medical history: Patient has high blood pressure and has had many presentations to the emergency room for high blood pressure.  It looks like perhaps he is not as well controlled as he could be outside the hospital.  History of diabetes and COPD.  Substance abuse history: Nothing in the chart about it and the patient denies any alcohol or drug use current or past  Past Psychiatric History: No specific diagnosis accompanied the patient but it appears that he tells me as well that he is on a Haldol Decanoate shot that he receives every month.  He last got it on the fifth.  He says he has had one prior psychiatric hospitalization at [facility] many many years ago.  Cannot describe what symptoms he was having.  Denies any history of suicide attempts or violence.  Risk to Self: Is patient at risk for suicide?: No Risk to Others:   Prior Inpatient Therapy:   Prior Outpatient Therapy:    Past Medical History:  Past Medical History:  Diagnosis Date  . Diabetes mellitus without complication ([place])   . Hypertension   . Mental health disorder     Past Surgical History:  Procedure Laterality Date  . NO PAST SURGERIES     Family History:  Family History  Problem Relation Age of Onset  . CVA Mother   . CAD Father    Family Psychiatric  History: Does not know of any Social History:  Social History   Substance and Sexual Activity  Alcohol Use  No     Social History   Substance and Sexual Activity  Drug Use No    Social History   Socioeconomic History  . Marital status: Single    Spouse name: None  . Number of children: None  . Years of education: None  . Highest education level: None  Social Needs  . Financial resource strain: None  . Food insecurity - worry: None  . Food insecurity - inability: None  . Transportation needs - medical: None  . Transportation needs - non-medical: None  Occupational History  .  None  Tobacco Use  . Smoking status: Current Some Day Smoker    Packs/day: 0.50  . Smokeless tobacco: Current User  Substance and Sexual Activity  . Alcohol use: No  . Drug use: No  . Sexual activity: None  Other Topics Concern  . None  Social History Narrative  . None   Additional Social History:    Allergies:   Allergies  Allergen Reactions  . Asa [Aspirin]     Labs:  Results for orders placed or performed during the hospital encounter of 11/15/17 (from the past 48 hour(s))  Ethanol     Status: None   Collection Time: 11/15/17  6:19 PM  Result Value Ref Range   Alcohol, Ethyl (B) <10 <10 mg/dL    Comment:        LOWEST DETECTABLE LIMIT FOR SERUM ALCOHOL IS 10 mg/dL FOR MEDICAL PURPOSES ONLY Performed at [facility], [address] [place], [place] [address]   Protime-INR     Status: None   Collection Time: 11/15/17  6:19 PM  Result Value Ref Range   Prothrombin Time 13.0 11.4 - 15.2 seconds   INR 0.99     Comment: Performed at [facility], [address] [place], [place] [address]  APTT     Status: None   Collection Time: 11/15/17  6:19 PM  Result Value Ref Range   aPTT 33 24 - 36 seconds    Comment: Performed at [facility], [address] [place], [place] [address]  CBC     Status: Abnormal   Collection Time: 11/15/17  6:19 PM  Result Value Ref Range   WBC 4.9 3.8 - 10.6 K/uL   RBC 5.39 4.40 - 5.90 MIL/uL   Hemoglobin 15.7 13.0 - 18.0 g/dL   HCT 47.4 40.0 - 52.0 %   MCV 88.0 80.0 - 100.0 fL   MCH 29.1 26.0 - 34.0 pg   MCHC 33.1 32.0 - 36.0 g/dL   RDW 14.9 (H) 11.5 - 14.5 %   Platelets 291 150 - 440 K/uL    Comment: Performed at [facility], [address] [place], [place] [address]  Differential     Status: Abnormal   Collection Time: 11/15/17  6:19 PM  Result Value Ref Range   Neutrophils Relative % 73 %   Neutro Abs 3.6 1.4 - 6.5 K/uL   Lymphocytes Relative 16 %   Lymphs Abs 0.8 (L) 1.0 - 3.6 K/uL    Monocytes Relative 9 %   Monocytes Absolute 0.4 0.2 - 1.0 K/uL   Eosinophils Relative 1 %   Eosinophils Absolute 0.0 0 - 0.7 K/uL   Basophils Relative 1 %   Basophils Absolute 0.0 0 - 0.1 K/uL    Comment: Performed at [facility], [address] [place], [place] [address]  Comprehensive metabolic panel     Status: Abnormal   Collection Time: 11/15/17  6:19 PM  Result Value Ref Range   Sodium 140 135 - 145 mmol/L   Potassium 3.9 3.5 - 5.1 mmol/L   Chloride 104 101 - 111 mmol/L   CO2 27 22 - 32 mmol/L   Glucose, Bld 190 (H) 65 - 99 mg/dL   BUN 15 6 - 20 mg/dL   Creatinine, Ser 0.97 0.61 - 1.24 mg/dL   Calcium 9.3 8.9 - 10.3 mg/dL   Total Protein 7.4 6.5 - 8.1 g/dL   Albumin 4.4 3.5 - 5.0 g/dL   AST 20 15 - 41 U/L   ALT 14 (L) 17 - 63 U/L   Alkaline Phosphatase 72 38 - 126 U/L   Total Bilirubin 0.6 0.3 - 1.2 mg/dL   GFR calc non Af Amer >60 >60 mL/min   GFR calc Af Amer >60 >60 mL/min    Comment: (NOTE) The eGFR has been calculated using the CKD EPI equation. This calculation has not been validated in all clinical situations. eGFR's persistently <60 mL/min signify possible Chronic Kidney Disease.    Anion gap 9 5 - 15    Comment: Performed at [facility], [address] [place], [place] [address]  Troponin I     Status: Abnormal   Collection Time: 11/15/17  6:19 PM  Result Value Ref Range   Troponin I 0.04 (HH) <0.03 ng/mL    Comment: CRITICAL RESULT CALLED TO, READ BACK BY AND VERIFIED WITH ALLISON CATES AT 1907 ON 11/15/2017 JJB Performed at [facility], [address] [place], [place] [address]   Blood gas, venous     Status: Abnormal   Collection Time: 11/15/17  6:19 PM  Result Value Ref Range   pH, Ven 7.36 7.250 - 7.430   pCO2, Ven 51 44.0 - 60.0 mmHg   pO2, Ven 33.0 32.0 - 45.0 mmHg   Bicarbonate 28.8 (H) 20.0 - 28.0 mmol/L   Acid-Base Excess 2.1 (H) 0.0 - 2.0 mmol/L   O2 Saturation 60.6 %   Patient temperature 37.0     Collection site VEIN    Sample type VENOUS     Comment: Performed at [facility], [address] [place], [place] [address]  Glucose, capillary     Status: Abnormal   Collection Time: 11/15/17 11:55 PM  Result Value Ref Range   Glucose-Capillary 130 (H) 65 - 99 mg/dL  Troponin I     Status: Abnormal   Collection Time: 11/16/17 12:01 AM  Result Value Ref Range   Troponin I 0.04 (HH) <0.03 ng/mL    Comment: CRITICAL VALUE NOTED. VALUE IS CONSISTENT WITH PREVIOUSLY REPORTED/CALLED VALUE...[facility] Performed at [facility], [address] [place], [place] [address]   Urine Drug Screen, Qualitative     Status: Abnormal   Collection Time: 11/16/17 12:09 AM  Result Value Ref Range   Tricyclic, Ur Screen NONE DETECTED NONE DETECTED   Amphetamines, Ur Screen NONE DETECTED NONE DETECTED   MDMA (Ecstasy)Ur Screen NONE DETECTED NONE DETECTED   Cocaine Metabolite,Ur [place] NONE DETECTED NONE DETECTED   Opiate, Ur Screen NONE DETECTED NONE DETECTED   Phencyclidine (PCP) Ur S NONE DETECTED NONE DETECTED   Cannabinoid 50 Ng, Ur [place] NONE DETECTED NONE DETECTED   Barbiturates, Ur Screen NONE DETECTED NONE DETECTED   Benzodiazepine, Ur Scrn POSITIVE (A) NONE DETECTED   Methadone Scn, Ur NONE DETECTED NONE DETECTED    Comment: (NOTE) Tricyclics + metabolites, urine    Cutoff 1000 ng/mL Amphetamines + metabolites, urine  Cutoff  1000 ng/mL MDMA (Ecstasy), urine              Cutoff 500 ng/mL Cocaine Metabolite, urine          Cutoff 300 ng/mL Opiate + metabolites, urine        Cutoff 300 ng/mL Phencyclidine (PCP), urine         Cutoff 25 ng/mL Cannabinoid, urine                 Cutoff 50 ng/mL Barbiturates + metabolites, urine  Cutoff 200 ng/mL Benzodiazepine, urine              Cutoff 200 ng/mL Methadone, urine                   Cutoff 300 ng/mL The urine drug screen provides only a preliminary, unconfirmed analytical test result and should not be used for non-medical purposes.  Clinical consideration and professional judgment should be applied to any positive drug screen result due to possible interfering substances. A more specific alternate chemical method must be used in order to obtain a confirmed analytical result. Gas chromatography / mass spectrometry (GC/MS) is the preferred confirmat ory method. Performed at [facility], [address] [place], [place] [address]   Urinalysis, Routine w reflex microscopic     Status: Abnormal   Collection Time: 11/16/17 12:09 AM  Result Value Ref Range   Color, Urine YELLOW (A) YELLOW   APPearance CLEAR (A) CLEAR   Specific Gravity, Urine 1.034 (H) 1.005 - 1.030   pH 6.0 5.0 - 8.0   Glucose, UA NEGATIVE NEGATIVE mg/dL   Hgb urine dipstick NEGATIVE NEGATIVE   Bilirubin Urine NEGATIVE NEGATIVE   Ketones, ur NEGATIVE NEGATIVE mg/dL   Protein, ur NEGATIVE NEGATIVE mg/dL   Nitrite NEGATIVE NEGATIVE   Leukocytes, UA NEGATIVE NEGATIVE    Comment: Performed at [facility], [address], [place], [place] [address]  MRSA PCR Screening     Status: None   Collection Time: 11/16/17 12:10 AM  Result Value Ref Range   MRSA by PCR NEGATIVE NEGATIVE    Comment:        The GeneXpert MRSA Assay (FDA approved for NASAL specimens only), is one component of a comprehensive MRSA colonization surveillance program. It is not intended to diagnose MRSA infection nor to guide or monitor treatment for MRSA infections. Performed at [facility], [address] [place], [place] [address]   Hemoglobin A1c     Status: Abnormal   Collection Time: 11/16/17  5:03 AM  Result Value Ref Range   Hgb A1c MFr Bld 7.9 (H) 4.8 - 5.6 %    Comment: (NOTE) Pre diabetes:          5.7%-6.4% Diabetes:              >6.4% Glycemic control for   <7.0% adults with diabetes    Mean Plasma Glucose 180.03 mg/dL    Comment: Performed at [facility] [address], [place], [place] [address]  Lipid panel      Status: Abnormal   Collection Time: 11/16/17  5:03 AM  Result Value Ref Range   Cholesterol 188 0 - 200 mg/dL   Triglycerides 72 <150 mg/dL   HDL 42 >40 mg/dL   Total CHOL/HDL Ratio 4.5 RATIO   VLDL 14 0 - 40 mg/dL   LDL Cholesterol 132 (H) 0 - 99 mg/dL    Comment:  Total Cholesterol/HDL:CHD Risk Coronary Heart Disease Risk Table                     Men   Women  1/2 Average Risk   3.4   3.3  Average Risk       5.0   4.4  2 X Average Risk   9.6   7.1  3 X Average Risk  23.4   11.0        Use the calculated Patient Ratio above and the CHD Risk Table to determine the patient's CHD Risk.        ATP III CLASSIFICATION (LDL):  <100     mg/dL   Optimal  100-129  mg/dL   Near or Above                    Optimal  130-159  mg/dL   Borderline  160-189  mg/dL   High  >190     mg/dL   Very High Performed at [facility], [address] [place], [place] [address]   Troponin I     Status: Abnormal   Collection Time: 11/16/17  5:03 AM  Result Value Ref Range   Troponin I 0.04 (HH) <0.03 ng/mL    Comment: CRITICAL VALUE NOTED. VALUE IS CONSISTENT WITH PREVIOUSLY REPORTED/CALLED VALUE. JAG Performed at [facility], [address] [place], [place] [address]   CBC     Status: Abnormal   Collection Time: 11/16/17  5:03 AM  Result Value Ref Range   WBC 5.0 3.8 - 10.6 K/uL   RBC 5.00 4.40 - 5.90 MIL/uL   Hemoglobin 14.3 13.0 - 18.0 g/dL   HCT 44.2 40.0 - 52.0 %   MCV 88.5 80.0 - 100.0 fL   MCH 28.6 26.0 - 34.0 pg   MCHC 32.4 32.0 - 36.0 g/dL   RDW 15.1 (H) 11.5 - 14.5 %   Platelets 257 150 - 440 K/uL    Comment: Performed at [facility], [address] [place], [place] [address]  Glucose, capillary     Status: None   Collection Time: 11/16/17  7:40 AM  Result Value Ref Range   Glucose-Capillary 95 65 - 99 mg/dL  Glucose, capillary     Status: Abnormal   Collection Time: 11/16/17 12:05 PM  Result Value Ref Range   Glucose-Capillary 164 (H) 65  - 99 mg/dL    Current Facility-Administered Medications  Medication Dose Route Frequency Provider Last Rate Last Dose  . acetaminophen (TYLENOL) tablet 650 mg  650 mg Oral Q4H PRN Lance Coon, MD       Or  . acetaminophen (TYLENOL) solution 650 mg  650 mg Per Tube Q4H PRN Lance Coon, MD       Or  . acetaminophen (TYLENOL) suppository 650 mg  650 mg Rectal Q4H PRN Lance Coon, MD      . albuterol (PROVENTIL) (2.5 MG/3ML) 0.083% nebulizer solution 2.5 mg  2.5 mg Nebulization Q6H PRN [name], MD      . atorvastatin (LIPITOR) tablet 40 mg  40 mg Oral Corwin Levins, MD   40 mg at 11/15/17 2356  . benztropine (COGENTIN) tablet 2 mg  2 mg Oral Corwin Levins, MD   2 mg at 11/15/17 2355  . cholecalciferol (VITAMIN D) tablet 5,000 Units  5,000 Units Oral Daily [name], MD      . cloNIDine (CATAPRES) tablet 0.3 mg  0.3 mg Oral Q8H  [name], MD   0.3 mg at 11/16/17 1310  . diphenhydrAMINE (BENADRYL) capsule 25 mg  25 mg Oral QHS Lance Coon, MD      . enoxaparin (LOVENOX) injection 40 mg  40 mg Subcutaneous Q24H Lance Coon, MD      . insulin aspart (novoLOG) injection 0-5 Units  0-5 Units Subcutaneous QHS Lance Coon, MD      . insulin aspart (novoLOG) injection 0-9 Units  0-9 Units Subcutaneous TID WC Lance Coon, MD   2 Units at 11/16/17 1300  . ipratropium-albuterol (DUONEB) 0.5-2.5 (3) MG/3ML nebulizer solution 3 mL  3 mL Nebulization Once Merlyn Lot, MD      . LORazepam (ATIVAN) tablet 0.5 mg  0.5 mg Oral Once Merlyn Lot, MD      . metoprolol succinate (TOPROL-XL) 24 hr tablet 100 mg  100 mg Oral Daily [name], MD   100 mg at 11/16/17 1300    Musculoskeletal: Strength & Muscle Tone: within normal limits Gait & Station: normal Patient leans: N/A  Psychiatric Specialty Exam: Physical Exam  Nursing note and vitals reviewed. Constitutional: He appears well-developed and well-nourished.  HENT:  Head: Normocephalic and atraumatic.  Eyes:  Conjunctivae are normal. Pupils are equal, round, and reactive to light.  Neck: Normal range of motion.  Cardiovascular: Regular rhythm and normal heart sounds.  Respiratory: Effort normal. No respiratory distress.  GI: Soft.  Musculoskeletal: Normal range of motion.  Neurological: He is alert.  Skin: Skin is warm and dry.  Psychiatric: His behavior is normal. His affect is labile. His speech is tangential. Thought content is not paranoid. Cognition and memory are impaired. He expresses impulsivity. He expresses no homicidal and no suicidal ideation.    Review of Systems  Constitutional: Negative.   HENT: Negative.   Eyes: Negative.   Respiratory: Negative.   Cardiovascular: Negative.   Gastrointestinal: Negative.   Musculoskeletal: Negative.   Skin: Negative.   Neurological: Negative.   Psychiatric/Behavioral: Negative.     Blood pressure (!) 151/101, pulse 95, temperature 98.1 F (36.7 C), temperature source Oral, resp. rate 20, height '5\' 11"'  (1.803 m), weight 226 lb 4.8 oz (102.6 kg), SpO2 98 %.Body mass index is 31.56 kg/m.  General Appearance: Casual  Eye Contact:  Good  Speech:  Clear and Coherent  Volume:  Increased  Mood:  Euthymic  Affect:  Labile  Thought Process:  Goal Directed  Orientation:  Full (Time, Place, and Person)  Thought Content:  Rumination and Tangential  Suicidal Thoughts:  No  Homicidal Thoughts:  No  Memory:  Immediate;   Good Recent;   Fair Remote;   Fair  Judgement:  Fair  Insight:  Fair  Psychomotor Activity:  Normal  Concentration:  Concentration: Fair  Recall:  [employer] of Knowledge:  Fair  Language:  Fair  Akathisia:  No  Handed:  Right  AIMS (if indicated):     Assets:  Desire for Improvement Housing Resilience  ADL's:  Intact  Cognition:  Impaired,  Mild  Sleep:        Treatment Plan Summary: Plan This is a 52 year old man who presented with altered mental status probably related to an acute ischemic event.  Currently he  appears to be back to his baseline.  He has no psychiatric complaints.  His current mental state would be consistent with well-controlled schizophrenia or possibly mild developmental disability as well.  Patient shows no signs of dangerousness.  Does not require inpatient treatment.  Already gets a  long-acting shot so he does not need any change or addition to any antipsychotic medication.  Patient can be discharged back to his group home and follow-up in the community.  He tells me his outpatient psychiatrist is Dr. Leonides Schanz.  Disposition: No evidence of imminent risk to self or others at present.   Patient does not meet criteria for psychiatric inpatient admission.  Alethia Berthold, MD 11/16/2017 1:26 PM

## 2017-11-16 NOTE — Progress Notes (Signed)
OT Cancellation Note  Patient Details Name: Durward ParcelDennis Arnette MRN: [id number] DOB: Dec 27, 1965   Cancelled Treatment:     OT order received, patient screened for OT evaluation and appears back to baseline, no deficits noted. No further OT indicated at this time.  Amy T Lovett, OTR/L, CLT   Lovett,Amy 11/16/2017, 10:21 AM

## 2017-11-16 NOTE — NC FL2 (Signed)
MEDICAID FL2 LEVEL OF CARE SCREENING TOOL     IDENTIFICATION  Patient Name: Paul Hernandez Birthdate: 20-Jan-1966 Sex: male Admission Date (Current Location): 11/15/2017  [facility] [place]:  [place] ([id number]) Facility and Address:  [facility], [address], [place], [place] [address]      Provider Number: [id number]  Attending Physician Name and Address:  Enedina FinnerPatel, Sona, MD  Relative Name and Phone Number:       Current Level of Care: Hospital Recommended Level of Care: Family Care Home Prior Approval Number:    Date Approved/Denied:   PASRR Number:    Discharge Plan: Domiciliary (Rest home)    Current Diagnoses: Patient Active Problem List   Diagnosis Date Noted  . COPD (chronic obstructive pulmonary disease) (HCC) 11/15/2017  . Mental health disorder 11/15/2017  . Cerebral ischemia 11/15/2017  . Cerebral infarct (HCC) 11/15/2017  . Benign essential HTN 11/13/2017  . Diabetes type 2, controlled (HCC) 11/13/2017  . COPD exacerbation (HCC) 09/16/2017    Orientation RESPIRATION BLADDER Height & Weight     Self, Time, Situation, Place  Normal Continent Weight: 226 lb 4.8 oz (102.6 kg) Height:  5\' 11"  (180.3 cm)  BEHAVIORAL SYMPTOMS/MOOD NEUROLOGICAL BOWEL NUTRITION STATUS      Continent Diet(Diet: Heart Healthy/ Carb Modified. )  AMBULATORY STATUS COMMUNICATION OF NEEDS Skin   Independent Verbally Normal                       Personal Care Assistance Level of Assistance  Bathing, Feeding, Dressing Bathing Assistance: Independent Feeding assistance: Independent Dressing Assistance: Independent     Functional Limitations Info  Sight, Hearing, Speech Sight Info: Adequate Hearing Info: Adequate Speech Info: Adequate    SPECIAL CARE FACTORS FREQUENCY                       Contractures      Additional Factors Info  Code Status, Allergies Code Status Info: (Full Code. ) Allergies Info: (Asa  Aspirin)           Current Medications (11/16/2017):  This is the current hospital active medication list Current Facility-Administered Medications  Medication Dose Route Frequency Provider Last Rate Last Dose  . acetaminophen (TYLENOL) tablet 650 mg  650 mg Oral Q4H PRN Oralia ManisWillis, David, MD       Or  . acetaminophen (TYLENOL) solution 650 mg  650 mg Per Tube Q4H PRN Oralia ManisWillis, David, MD       Or  . acetaminophen (TYLENOL) suppository 650 mg  650 mg Rectal Q4H PRN Oralia ManisWillis, David, MD      . albuterol (PROVENTIL) (2.5 MG/3ML) 0.083% nebulizer solution 2.5 mg  2.5 mg Nebulization Q6H PRN Enedina FinnerPatel, Sona, MD      . atorvastatin (LIPITOR) tablet 40 mg  40 mg Oral Lamont SnowballQHS Willis, David, MD   40 mg at 11/15/17 2356  . benztropine (COGENTIN) tablet 2 mg  2 mg Oral Lamont SnowballQHS Willis, David, MD   2 mg at 11/15/17 2355  . cholecalciferol (VITAMIN D) tablet 5,000 Units  5,000 Units Oral Daily Enedina FinnerPatel, Sona, MD      . cloNIDine (CATAPRES) tablet 0.3 mg  0.3 mg Oral Q8H Enedina FinnerPatel, Sona, MD      . diphenhydrAMINE (BENADRYL) capsule 25 mg  25 mg Oral QHS Oralia ManisWillis, David, MD      . enoxaparin (LOVENOX) injection 40 mg  40 mg Subcutaneous Q24H Oralia ManisWillis, David, MD      .  insulin aspart (novoLOG) injection 0-5 Units  0-5 Units Subcutaneous QHS Oralia Manis, MD      . insulin aspart (novoLOG) injection 0-9 Units  0-9 Units Subcutaneous TID WC Oralia Manis, MD      . ipratropium-albuterol (DUONEB) 0.5-2.5 (3) MG/3ML nebulizer solution 3 mL  3 mL Nebulization Once Willy Eddy, MD      . LORazepam (ATIVAN) tablet 0.5 mg  0.5 mg Oral Once Willy Eddy, MD      . metoprolol succinate (TOPROL-XL) 24 hr tablet 100 mg  100 mg Oral Daily Enedina Finner, MD         Discharge Medications: Please see discharge summary for a list of discharge medications.  Relevant Imaging Results:  Relevant Lab Results:   Additional Information (SSN: [id number])  Jancarlos Thrun, Darleen Crocker, LCSW

## 2017-11-16 NOTE — Progress Notes (Signed)
Patient is medically stable for D/C back to [facility] group home today. Clinical Social Worker (CSW) contacted group home at [phone] and employee stated they would pick him up in 30 minutes. RN aware of above. Patient is aware of above. CSW left patient's friend Jodi GeraldsJohn Graves a voicemail. Please reconsult if future social work needs arise. CSW signing off.   B[employer] Hughes IncorporatedBailey [place], LCSW [phone]

## 2017-11-16 NOTE — Consult Note (Addendum)
Referring Physician: Allena Katz    Chief Complaint: AMS  HPI: Paul Hernandez is an 52 y.o. male who is unable to provide much in the way of mental history today.  All history obtained from the chart.  The patient is a group home resident.  Per report report of the group home worker the presentation the patient was at the grocery store with the worker and had acute change in mental status.  Was unable to speak.  Patient was brought in to the emergency room for further evaluation.  No information was able to be obtained about his history and therefore he was not considered a TPA candidate.  Patient has improved significantly since admission and does appear to be at baseline at this time. Initial NIH stroke scale of 4.  Date last known well: Date: 11/15/2017 Time last known well: Time: 17:15 tPA Given: No: Unable to obtain inclusion/exclusion information  Past Medical History:  Diagnosis Date  . Diabetes mellitus without complication (HCC)   . Hypertension   . Mental health disorder     Past Surgical History:  Procedure Laterality Date  . NO PAST SURGERIES      Family History  Problem Relation Age of Onset  . CVA Mother   . CAD Father    Social History:  reports that he has been smoking.  He has been smoking about 0.50 packs per day. He uses smokeless tobacco. He reports that he does not drink alcohol or use drugs.  Allergies:  Allergies  Allergen Reactions  . Asa [Aspirin]     Medications:  I have reviewed the patient's current medications. Prior to Admission:  Medications Prior to Admission  Medication Sig Dispense Refill Last Dose  . acetaminophen (TYLENOL) 325 MG tablet Take 650 mg by mouth every 6 (six) hours as needed.   PRN at PRN  . albuterol (PROVENTIL HFA;VENTOLIN HFA) 108 (90 Base) MCG/ACT inhaler Inhale 2 puffs into the lungs every 6 (six) hours as needed for wheezing or shortness of breath. 1 Inhaler 2 PRN at PRN  . albuterol (PROVENTIL) (2.5 MG/3ML) 0.083% nebulizer  solution Take 2.5 mg by nebulization 4 (four) times daily.   PRN at PRN  . amLODipine-benazepril (LOTREL) 10-20 MG capsule Take 1 capsule by mouth every evening.    11/14/2017 at 1800  . atorvastatin (LIPITOR) 40 MG tablet Take 40 mg by mouth at bedtime.    11/14/2017 at 2000  . benztropine (COGENTIN) 2 MG tablet Take 2 mg by mouth at bedtime.   11/14/2017 at 2000  . Cholecalciferol (VITAMIN D3) 5000 units CAPS Take 5,000 Units by mouth daily.   11/15/2017 at 0800  . cloNIDine (CATAPRES) 0.3 MG tablet Take 0.3 mg by mouth every 8 (eight) hours.    11/15/2017 at 0800  . dextrose (GLUTOSE) 40 % GEL Take by mouth once as needed for low blood sugar. For blood sugar <70   PRN at PRN  . diphenhydrAMINE (BENADRYL) 25 mg capsule Take 25 mg by mouth at bedtime.    11/14/2017 at 2000  . haloperidol decanoate (HALDOL DECANOATE) 100 MG/ML injection Inject 90 mg into the muscle every 28 (twenty-eight) days.    10/25/2017 at N/A  . metFORMIN (GLUCOPHAGE) 500 MG tablet Take 1,000-1,500 mg by mouth daily with breakfast. Take 1500 mg in the morning and 1,000 mg at bedtime.   11/15/2017 at 0800  . metoprolol (TOPROL-XL) 200 MG 24 hr tablet Take 400 mg by mouth daily.    11/15/2017 at 0800  .  triamterene-hydrochlorothiazide (MAXZIDE) 75-50 MG tablet Take 1 tablet by mouth every morning.    11/14/2017 at 1800  . amLODipine (NORVASC) 10 MG tablet Take 1 tablet (10 mg total) by mouth daily. (Patient not taking: Reported on 09/16/2017) 30 tablet 1 Not Taking at Unknown time   Scheduled: . atorvastatin  40 mg Oral QHS  . benztropine  2 mg Oral QHS  . cholecalciferol  5,000 Units Oral Daily  . cloNIDine  0.3 mg Oral Q8H  . diphenhydrAMINE  25 mg Oral QHS  . enoxaparin (LOVENOX) injection  40 mg Subcutaneous Q24H  . insulin aspart  0-5 Units Subcutaneous QHS  . insulin aspart  0-9 Units Subcutaneous TID WC  . ipratropium-albuterol  3 mL Nebulization Once  . LORazepam  0.5 mg Oral Once  . metoprolol  100 mg Oral Daily     ROS: History obtained from the patient  General ROS: negative for - chills, fatigue, fever, night sweats, weight gain or weight loss Psychological ROS: negative for - behavioral disorder, hallucinations, memory difficulties, mood swings or suicidal ideation Ophthalmic ROS: negative for - blurry vision, double vision, eye pain or loss of vision ENT ROS: negative for - epistaxis, nasal discharge, oral lesions, sore throat, tinnitus or vertigo Allergy and Immunology ROS: negative for - hives or itchy/watery eyes Hematological and Lymphatic ROS: negative for - bleeding problems, bruising or swollen lymph nodes Endocrine ROS: negative for - galactorrhea, hair pattern changes, polydipsia/polyuria or temperature intolerance Respiratory ROS: negative for - cough, hemoptysis, shortness of breath or wheezing Cardiovascular ROS: negative for - chest pain, dyspnea on exertion, edema or irregular heartbeat Gastrointestinal ROS: negative for - abdominal pain, diarrhea, hematemesis, nausea/vomiting or stool incontinence Genito-Urinary ROS: negative for - dysuria, hematuria, incontinence or urinary frequency/urgency Musculoskeletal ROS: negative for - joint swelling or muscular weakness Neurological ROS: as noted in HPI Dermatological ROS: negative for rash and skin lesion changes  Physical Examination: Blood pressure (!) 145/93, pulse 92, temperature 98.1 F (36.7 C), temperature source Oral, resp. rate 20, height 5\' 11"  (1.803 m), weight 102.6 kg (226 lb 4.8 oz), SpO2 97 %.  HEENT-  Normocephalic, no lesions, without obvious abnormality.  Normal external eye and conjunctiva.  Normal TM's bilaterally.  Normal auditory canals and external ears. Normal external nose, mucus membranes and septum.  Normal pharynx. Cardiovascular- S1, S2 normal, pulses palpable throughout   Lungs- chest clear, no wheezing, rales, normal symmetric air entry Abdomen- soft, non-tender; bowel sounds normal; no masses,  no  organomegaly Extremities- no edema Lymph-no adenopathy palpable Musculoskeletal-no joint tenderness, deformity or swelling Skin-warm and dry, no hyperpigmentation, vitiligo, or suspicious lesions  Neurological Examination   Mental Status: Alert, oriented to name and place.  Speech fluent without evidence of aphasia.  Able to follow 3 step commands without difficulty. Cranial Nerves: II: Discs flat bilaterally; Visual fields grossly normal, pupils equal, round, reactive to light and accommodation III,IV, VI: ptosis not present, extra-ocular motions intact bilaterally V,VII: smile symmetric, facial light touch sensation normal bilaterally VIII: hearing normal bilaterally IX,X: gag reflex present XI: bilateral shoulder shrug XII: midline tongue extension Motor: Right : Upper extremity   5/5    Left:     Upper extremity   5/5  Lower extremity   5/5     Lower extremity   5/5 Tone and bulk:normal tone throughout; no atrophy noted Sensory: Pinprick and light touch intact throughout, bilaterally Deep Tendon Reflexes: 1+ in the UE's and absent in the LE's Plantars: Right: downgoing  Left: downgoing Cerebellar: Normal finger-to-nose and normal heel-to-shin testing bilaterally Gait: normal gait and station    Laboratory Studies:  Basic Metabolic Panel: Recent Labs  Lab 11/15/17 1819  NA 140  K 3.9  CL 104  CO2 27  GLUCOSE 190*  BUN 15  CREATININE 0.97  CALCIUM 9.3    Liver Function Tests: Recent Labs  Lab 11/15/17 1819  AST 20  ALT 14*  ALKPHOS 72  BILITOT 0.6  PROT 7.4  ALBUMIN 4.4   No results for input(s): LIPASE, AMYLASE in the last 168 hours. No results for input(s): AMMONIA in the last 168 hours.  CBC: Recent Labs  Lab 11/15/17 1819 11/16/17 0503  WBC 4.9 5.0  NEUTROABS 3.6  --   HGB 15.7 14.3  HCT 47.4 44.2  MCV 88.0 88.5  PLT 291 257    Cardiac Enzymes: Recent Labs  Lab 11/15/17 1819 11/16/17 0001 11/16/17 0503  TROPONINI 0.04* 0.04* 0.04*     BNP: Invalid input(s): POCBNP  CBG: Recent Labs  Lab 11/15/17 2355 11/16/17 0740 11/16/17 1205  GLUCAP 130* 95 164*    Microbiology: Results for orders placed or performed during the hospital encounter of 11/15/17  MRSA PCR Screening     Status: None   Collection Time: 11/16/17 12:10 AM  Result Value Ref Range Status   MRSA by PCR NEGATIVE NEGATIVE Final    Comment:        The GeneXpert MRSA Assay (FDA approved for NASAL specimens only), is one component of a comprehensive MRSA colonization surveillance program. It is not intended to diagnose MRSA infection nor to guide or monitor treatment for MRSA infections. Performed at [facility], [address] [place], [place] [address]     Coagulation Studies: Recent Labs    11/15/17 1819  LABPROT 13.0  INR 0.99    Urinalysis:  Recent Labs  Lab 11/16/17 0009  COLORURINE YELLOW*  LABSPEC 1.034*  PHURINE 6.0  GLUCOSEU NEGATIVE  HGBUR NEGATIVE  BILIRUBINUR NEGATIVE  KETONESUR NEGATIVE  PROTEINUR NEGATIVE  NITRITE NEGATIVE  LEUKOCYTESUR NEGATIVE    Lipid Panel:    Component Value Date/Time   CHOL 188 11/16/2017 0503   TRIG 72 11/16/2017 0503   HDL 42 11/16/2017 0503   CHOLHDL 4.5 11/16/2017 0503   VLDL 14 11/16/2017 0503   LDLCALC 132 (H) 11/16/2017 0503    HgbA1C:  Lab Results  Component Value Date   HGBA1C 7.9 (H) 11/16/2017    Urine Drug Screen:      Component Value Date/Time   LABOPIA NONE DETECTED 11/16/2017 0009   COCAINSCRNUR NONE DETECTED 11/16/2017 0009   LABBENZ POSITIVE (A) 11/16/2017 0009   AMPHETMU NONE DETECTED 11/16/2017 0009   THCU NONE DETECTED 11/16/2017 0009   LABBARB NONE DETECTED 11/16/2017 0009    Alcohol Level:  Recent Labs  Lab 11/15/17 1819  ETH <10    Other results: EKG: sinus tachycardia at 100 bpm.  Imaging: Ct Angio Head W Or Wo Contrast  Result Date: 11/15/2017 CLINICAL DATA:  Altered mental status. EXAM: CT ANGIOGRAPHY HEAD AND NECK CT  PERFUSION BRAIN TECHNIQUE: Multidetector CT imaging of the head and neck was performed using the standard protocol during bolus administration of intravenous contrast. Multiplanar CT image reconstructions and MIPs were obtained to evaluate the vascular anatomy. Carotid stenosis measurements (when applicable) are obtained utilizing NASCET criteria, using the distal internal carotid diameter as the denominator. Multiphase CT imaging of the brain was performed following IV bolus contrast injection. Subsequent parametric perfusion  maps were calculated using RAPID software. CONTRAST:  100mL ISOVUE-370 IOPAMIDOL (ISOVUE-370) INJECTION 76% COMPARISON:  None. FINDINGS: CTA NECK FINDINGS Aortic arch: There is minimal calcific atherosclerosis of the aortic arch. There is no aneurysm, dissection or hemodynamically significant stenosis of the visualized ascending aorta and aortic arch. Conventional 3 vessel aortic branching pattern. The visualized proximal subclavian arteries are normal. Right carotid system: The right common carotid origin is widely patent. There is no common carotid or internal carotid artery dissection or aneurysm. No hemodynamically significant stenosis. Left carotid system: The left common carotid origin is widely patent. There is no common carotid or internal carotid artery dissection or aneurysm. No hemodynamically significant stenosis. Vertebral arteries: The vertebral system is left dominant. Both vertebral artery origins are normal. Both vertebral arteries are normal to their confluence with the basilar artery. Skeleton: There is no bony spinal canal stenosis. No lytic or blastic lesions. Other neck: The nasopharynx is clear. The oropharynx and hypopharynx are normal. The epiglottis is normal. The supraglottic larynx, glottis and subglottic larynx are normal. No retropharyngeal collection. The parapharyngeal spaces are preserved. The parotid and submandibular glands are normal. No sialolithiasis or  salivary ductal dilatation. The thyroid gland is normal. There is no cervical lymphadenopathy. Upper chest: No pneumothorax or pleural effusion. No nodules or masses. Review of the MIP images confirms the above findings CTA HEAD FINDINGS Anterior circulation: --Intracranial internal carotid arteries: Normal. --Anterior cerebral arteries: Normal. --Middle cerebral arteries: Normal. --Posterior communicating arteries: Absent bilaterally. Posterior circulation: --Posterior cerebral arteries: Normal. --Superior cerebellar arteries: Normal. --Basilar artery: Normal. --Anterior inferior cerebellar arteries: Normal. --Posterior inferior cerebellar arteries: Normal. Venous sinuses: As permitted by contrast timing, patent. Anatomic variants: None Delayed phase: No parenchymal contrast enhancement. Review of the MIP images confirms the above findings. CT Brain Perfusion Findings: CBF (<30%) Volume: 0mL Perfusion (Tmax>6.0s) volume: 48mL Mismatch Volume: 48mL Ischemia location:Bilateral deep watershed distribution IMPRESSION: 1. No emergent large vessel occlusion or high-grade stenosis. 2. Acute ischemia within the bilateral deep watershed zones. No core infarct per perfusion criteria 3.  Aortic Atherosclerosis (ICD10-I70.0). Electronically Signed   By: Deatra RobinsonKevin  Herman M.D.   On: 11/15/2017 20:10   Ct Head Wo Contrast  Result Date: 11/15/2017 CLINICAL DATA:  Altered mental status for a few hours with altered speech EXAM: CT HEAD WITHOUT CONTRAST TECHNIQUE: Contiguous axial images were obtained from the base of the skull through the vertex without intravenous contrast. COMPARISON:  None. FINDINGS: Brain: No evidence of acute infarction, hemorrhage, hydrocephalus, extra-axial collection or mass lesion/mass effect. Vascular: No hyperdense vessel or unexpected calcification. Skull: Normal. Negative for fracture or focal lesion. Sinuses/Orbits: No acute finding. Other: None. IMPRESSION: No acute abnormality noted. Electronically  Signed   By: Alcide CleverMark  Lukens M.D.   On: 11/15/2017 18:37   Ct Angio Neck W Or Wo Contrast  Result Date: 11/15/2017 CLINICAL DATA:  Altered mental status. EXAM: CT ANGIOGRAPHY HEAD AND NECK CT PERFUSION BRAIN TECHNIQUE: Multidetector CT imaging of the head and neck was performed using the standard protocol during bolus administration of intravenous contrast. Multiplanar CT image reconstructions and MIPs were obtained to evaluate the vascular anatomy. Carotid stenosis measurements (when applicable) are obtained utilizing NASCET criteria, using the distal internal carotid diameter as the denominator. Multiphase CT imaging of the brain was performed following IV bolus contrast injection. Subsequent parametric perfusion maps were calculated using RAPID software. CONTRAST:  100mL ISOVUE-370 IOPAMIDOL (ISOVUE-370) INJECTION 76% COMPARISON:  None. FINDINGS: CTA NECK FINDINGS Aortic arch: There is minimal calcific atherosclerosis of the  aortic arch. There is no aneurysm, dissection or hemodynamically significant stenosis of the visualized ascending aorta and aortic arch. Conventional 3 vessel aortic branching pattern. The visualized proximal subclavian arteries are normal. Right carotid system: The right common carotid origin is widely patent. There is no common carotid or internal carotid artery dissection or aneurysm. No hemodynamically significant stenosis. Left carotid system: The left common carotid origin is widely patent. There is no common carotid or internal carotid artery dissection or aneurysm. No hemodynamically significant stenosis. Vertebral arteries: The vertebral system is left dominant. Both vertebral artery origins are normal. Both vertebral arteries are normal to their confluence with the basilar artery. Skeleton: There is no bony spinal canal stenosis. No lytic or blastic lesions. Other neck: The nasopharynx is clear. The oropharynx and hypopharynx are normal. The epiglottis is normal. The supraglottic  larynx, glottis and subglottic larynx are normal. No retropharyngeal collection. The parapharyngeal spaces are preserved. The parotid and submandibular glands are normal. No sialolithiasis or salivary ductal dilatation. The thyroid gland is normal. There is no cervical lymphadenopathy. Upper chest: No pneumothorax or pleural effusion. No nodules or masses. Review of the MIP images confirms the above findings CTA HEAD FINDINGS Anterior circulation: --Intracranial internal carotid arteries: Normal. --Anterior cerebral arteries: Normal. --Middle cerebral arteries: Normal. --Posterior communicating arteries: Absent bilaterally. Posterior circulation: --Posterior cerebral arteries: Normal. --Superior cerebellar arteries: Normal. --Basilar artery: Normal. --Anterior inferior cerebellar arteries: Normal. --Posterior inferior cerebellar arteries: Normal. Venous sinuses: As permitted by contrast timing, patent. Anatomic variants: None Delayed phase: No parenchymal contrast enhancement. Review of the MIP images confirms the above findings. CT Brain Perfusion Findings: CBF (<30%) Volume: 0mL Perfusion (Tmax>6.0s) volume: 48mL Mismatch Volume: 48mL Ischemia location:Bilateral deep watershed distribution IMPRESSION: 1. No emergent large vessel occlusion or high-grade stenosis. 2. Acute ischemia within the bilateral deep watershed zones. No core infarct per perfusion criteria 3.  Aortic Atherosclerosis (ICD10-I70.0). Electronically Signed   By: Deatra Robinson M.D.   On: 11/15/2017 20:10   Mr Brain Wo Contrast  Result Date: 11/16/2017 CLINICAL DATA:  Acute change in mental status and abnormal speech. Stroke. EXAM: MRI HEAD WITHOUT CONTRAST TECHNIQUE: Multiplanar, multiecho pulse sequences of the brain and surrounding structures were obtained without intravenous contrast. COMPARISON:  CTA of the head and neck 11/15/2017. CT head without contrast 11/15/2017. FINDINGS: Brain: A punctate acute cortical infarct is present in the  left frontal lobe along the inferior frontal gyrus. This involves the pre motor area. No other acute infarct is present. Study is mildly degraded by patient motion. There is T2 signal change associated with this lesion suggesting a subacute time frame. No acute hemorrhage or mass lesion is present. Dilated perivascular spaces are present throughout the frontal lobes and basal ganglia bilaterally. The brainstem and cerebellum are normal. Vascular: Flow is present in the major intracranial arteries. Skull and upper cervical spine: The skull base is within normal limits. The craniocervical junction is normal. Midline sagittal structures are unremarkable. Degenerative disc disease is present at C3-4. Sinuses/Orbits: Mild mucosal thickening is present in the inferior maxillary sinuses bilaterally. Posterior left ethmoid sinus opacification is present. The remaining paranasal sinuses can the mastoid air cells are clear. IMPRESSION: 1. Punctate nonhemorrhagic cortical infarct involving the left inferior frontal gyrus within the pre motor area. 2. No other acute or focal cortical abnormality. 3. Dilated perivascular spaces suggesting some degree of atrophy. 4. Inferior maxillary and posterior left ethmoid sinus disease as described. Electronically Signed   By: Marin Roberts M.D.   On:  11/16/2017 12:31   Ct Cerebral Perfusion W Contrast  Result Date: 11/15/2017 CLINICAL DATA:  Altered mental status. EXAM: CT ANGIOGRAPHY HEAD AND NECK CT PERFUSION BRAIN TECHNIQUE: Multidetector CT imaging of the head and neck was performed using the standard protocol during bolus administration of intravenous contrast. Multiplanar CT image reconstructions and MIPs were obtained to evaluate the vascular anatomy. Carotid stenosis measurements (when applicable) are obtained utilizing NASCET criteria, using the distal internal carotid diameter as the denominator. Multiphase CT imaging of the brain was performed following IV bolus  contrast injection. Subsequent parametric perfusion maps were calculated using RAPID software. CONTRAST:  ISOVUE-370 IOPAMIDOL (ISOVUE-370) INJECTION 76% COMPARISON:  None. FINDINGS: CTA NECK FINDINGS Aortic arch: There is minimal calcific atherosclerosis of the aortic arch. There is no aneurysm, dissection or hemodynamically significant stenosis of the visualized ascending aorta and aortic arch. Conventional 3 vessel aortic branching pattern. The visualized proximal subclavian arteries are normal. Right carotid system: The right common carotid origin is widely patent. There is no common carotid or internal carotid artery dissection or aneurysm. No hemodynamically significant stenosis. Left carotid system: The left common carotid origin is widely patent. There is no common carotid or internal carotid artery dissection or aneurysm. No hemodynamically significant stenosis. Vertebral arteries: The vertebral system is left dominant. Both vertebral artery origins are normal. Both vertebral arteries are normal to their confluence with the basilar artery. Skeleton: There is no bony spinal canal stenosis. No lytic or blastic lesions. Other neck: The nasopharynx is clear. The oropharynx and hypopharynx are normal. The epiglottis is normal. The supraglottic larynx, glottis and subglottic larynx are normal. No retropharyngeal collection. The parapharyngeal spaces are preserved. The parotid and submandibular glands are normal. No sialolithiasis or salivary ductal dilatation. The thyroid gland is normal. There is no cervical lymphadenopathy. Upper chest: No pneumothorax or pleural effusion. No nodules or masses. Review of the MIP images confirms the above findings CTA HEAD FINDINGS Anterior circulation: --Intracranial internal carotid arteries: Normal. --Anterior cerebral arteries: Normal. --Middle cerebral arteries: Normal. --Posterior communicating arteries: Absent bilaterally. Posterior circulation: --Posterior cerebral  arteries: Normal. --Superior cerebellar arteries: Normal. --Basilar artery: Normal. --Anterior inferior cerebellar arteries: Normal. --Posterior inferior cerebellar arteries: Normal. Venous sinuses: As permitted by contrast timing, patent. Anatomic variants: None Delayed phase: No parenchymal contrast enhancement. Review of the MIP images confirms the above findings. CT Brain Perfusion Findings: CBF (<30%) Volume: 0mL Perfusion (Tmax>6.0s) volume: 48mL Mismatch Volume: 48mL Ischemia location:Bilateral deep watershed distribution IMPRESSION: 1. No emergent large vessel occlusion or high-grade stenosis. 2. Acute ischemia within the bilateral deep watershed zones. No core infarct per perfusion criteria 3.  Aortic Atherosclerosis (ICD10-I70.0). Electronically Signed   By: Deatra Robinson M.D.   On: 11/15/2017 20:10    Assessment: 52 y.o. male presenting with altered mental status.  Does now appear to be at baseline.  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  Etiology likely embolic.  Patient on no antiplatelet therapy prior to admission.  CTA of the head neck shows no evidence of large vessel occlusion or significant atherosclerosis.  A1c 7.9 LDL 132.  Echocardiogram shows no evidence of intracardiac thrombus but does reveal a EF of 25-30%.  Stroke Risk Factors - diabetes mellitus, hypertension and smoking  Plan: 1. Aggressive lipid management with target LDL<70. 2. PT consult, OT consult, Speech consult 3. Prophylactic therapy-patient to be seen by cardiology for evaluation of need for anticoagulation. 4. NPO until RN stroke swallow screen 5. Telemetry  monitoring 6. Frequent neuro checks 7. Smoking cessation counseling 8. Aggressive blood sugar management with target A1c less than 7.0 9. BP control   Thana Farr, MD Neurology [phone] 11/16/2017, 2:53 PM

## 2017-11-16 NOTE — Progress Notes (Signed)
*  PRELIMINARY RESULTS* Echocardiogram 2D Echocardiogram has been performed.  Cristela BlueHege, Marianela Mandrell 11/16/2017, 2:01 PM

## 2017-11-16 NOTE — Progress Notes (Signed)
[facility] [place] [facility]   PATIENT NAME: Paul Hernandez    MR#:  [id number]  DATE OF BIRTH:  Dec 12, 1965  SUBJECTIVE:  Patient came in with some aphasia/speech difficulty yesterday while going grocery shopping.  Brought in by group home staff. Doing well.  No neuro deficits.  Speech clear.  REVIEW OF SYSTEMS:   Review of Systems  Constitutional: Negative for chills, fever and weight loss.  HENT: Negative for ear discharge, ear pain and nosebleeds.   Eyes: Negative for blurred vision, pain and discharge.  Respiratory: Negative for sputum production, shortness of breath, wheezing and stridor.   Cardiovascular: Negative for chest pain, palpitations, orthopnea and PND.  Gastrointestinal: Negative for abdominal pain, diarrhea, nausea and vomiting.  Genitourinary: Negative for frequency and urgency.  Musculoskeletal: Negative for back pain and joint pain.  Neurological: Positive for weakness. Negative for sensory change, speech change and focal weakness.  Psychiatric/Behavioral: Negative for depression and hallucinations. The patient is not nervous/anxious.    Tolerating Diet:yes Tolerating PT: no needs  DRUG ALLERGIES:   Allergies  Allergen Reactions  . Asa [Aspirin]     VITALS:  Blood pressure (!) 145/93, pulse 92, temperature 98.1 F (36.7 C), temperature source Oral, resp. rate 20, height 5\' 11"  (1.803 m), weight 102.6 kg (226 lb 4.8 oz), SpO2 97 %.  PHYSICAL EXAMINATION:   Physical Exam  GENERAL:  52 y.o.-year-old patient lying in the bed with no acute distress.  EYES: Pupils equal, round, reactive to light and accommodation. No scleral icterus. Extraocular muscles intact.  HEENT: Head atraumatic, normocephalic. Oropharynx and nasopharynx clear.  NECK:  Supple, no jugular venous distention. No thyroid enlargement, no tenderness.  LUNGS: Normal breath sounds bilaterally, no wheezing, rales, rhonchi. No use of accessory muscles of  respiration.  CARDIOVASCULAR: S1, S2 normal. No murmurs, rubs, or gallops.  ABDOMEN: Soft, nontender, nondistended. Bowel sounds present. No organomegaly or mass.  EXTREMITIES: No cyanosis, clubbing or edema b/l.    NEUROLOGIC: Cranial nerves II through XII are intact. No focal Motor or sensory deficits b/l.   PSYCHIATRIC:  patient is alert and oriented x 3.  SKIN: No obvious rash, lesion, or ulcer.   LABORATORY PANEL:  CBC Recent Labs  Lab 11/16/17 0503  WBC 5.0  HGB 14.3  HCT 44.2  PLT 257    Chemistries  Recent Labs  Lab 11/15/17 1819  NA 140  K 3.9  CL 104  CO2 27  GLUCOSE 190*  BUN 15  CREATININE 0.97  CALCIUM 9.3  AST 20  ALT 14*  ALKPHOS 72  BILITOT 0.6   Cardiac Enzymes Recent Labs  Lab 11/16/17 0503  TROPONINI 0.04*   RADIOLOGY:  Ct Angio Head W Or Wo Contrast  Result Date: 11/15/2017 CLINICAL DATA:  Altered mental status. EXAM: CT ANGIOGRAPHY HEAD AND NECK CT PERFUSION BRAIN TECHNIQUE: Multidetector CT imaging of the head and neck was performed using the standard protocol during bolus administration of intravenous contrast. Multiplanar CT image reconstructions and MIPs were obtained to evaluate the vascular anatomy. Carotid stenosis measurements (when applicable) are obtained utilizing NASCET criteria, using the distal internal carotid diameter as the denominator. Multiphase CT imaging of the brain was performed following IV bolus contrast injection. Subsequent parametric perfusion maps were calculated using RAPID software. CONTRAST:  ISOVUE-370 IOPAMIDOL (ISOVUE-370) INJECTION 76% COMPARISON:  None. FINDINGS: CTA NECK FINDINGS Aortic arch: There is minimal calcific atherosclerosis of the aortic arch. There is no aneurysm, dissection or hemodynamically significant stenosis of  the visualized ascending aorta and aortic arch. Conventional 3 vessel aortic branching pattern. The visualized proximal subclavian arteries are normal. Right carotid system: The right  common carotid origin is widely patent. There is no common carotid or internal carotid artery dissection or aneurysm. No hemodynamically significant stenosis. Left carotid system: The left common carotid origin is widely patent. There is no common carotid or internal carotid artery dissection or aneurysm. No hemodynamically significant stenosis. Vertebral arteries: The vertebral system is left dominant. Both vertebral artery origins are normal. Both vertebral arteries are normal to their confluence with the basilar artery. Skeleton: There is no bony spinal canal stenosis. No lytic or blastic lesions. Other neck: The nasopharynx is clear. The oropharynx and hypopharynx are normal. The epiglottis is normal. The supraglottic larynx, glottis and subglottic larynx are normal. No retropharyngeal collection. The parapharyngeal spaces are preserved. The parotid and submandibular glands are normal. No sialolithiasis or salivary ductal dilatation. The thyroid gland is normal. There is no cervical lymphadenopathy. Upper chest: No pneumothorax or pleural effusion. No nodules or masses. Review of the MIP images confirms the above findings CTA HEAD FINDINGS Anterior circulation: --Intracranial internal carotid arteries: Normal. --Anterior cerebral arteries: Normal. --Middle cerebral arteries: Normal. --Posterior communicating arteries: Absent bilaterally. Posterior circulation: --Posterior cerebral arteries: Normal. --Superior cerebellar arteries: Normal. --Basilar artery: Normal. --Anterior inferior cerebellar arteries: Normal. --Posterior inferior cerebellar arteries: Normal. Venous sinuses: As permitted by contrast timing, patent. Anatomic variants: None Delayed phase: No parenchymal contrast enhancement. Review of the MIP images confirms the above findings. CT Brain Perfusion Findings: CBF (<30%) Volume: 0mL Perfusion (Tmax>6.0s) volume: 48mL Mismatch Volume: 48mL Ischemia location:Bilateral deep watershed distribution  IMPRESSION: 1. No emergent large vessel occlusion or high-grade stenosis. 2. Acute ischemia within the bilateral deep watershed zones. No core infarct per perfusion criteria 3.  Aortic Atherosclerosis (ICD10-I70.0). Electronically Signed   By: Paul Hernandez M.D.   On: 11/15/2017 20:10   Ct Head Wo Contrast  Result Date: 11/15/2017 CLINICAL DATA:  Altered mental status for a few hours with altered speech EXAM: CT HEAD WITHOUT CONTRAST TECHNIQUE: Contiguous axial images were obtained from the base of the skull through the vertex without intravenous contrast. COMPARISON:  None. FINDINGS: Brain: No evidence of acute infarction, hemorrhage, hydrocephalus, extra-axial collection or mass lesion/mass effect. Vascular: No hyperdense vessel or unexpected calcification. Skull: Normal. Negative for fracture or focal lesion. Sinuses/Orbits: No acute finding. Other: None. IMPRESSION: No acute abnormality noted. Electronically Signed   By: Paul Hernandez M.D.   On: 11/15/2017 18:37   Ct Angio Neck W Or Wo Contrast  Result Date: 11/15/2017 CLINICAL DATA:  Altered mental status. EXAM: CT ANGIOGRAPHY HEAD AND NECK CT PERFUSION BRAIN TECHNIQUE: Multidetector CT imaging of the head and neck was performed using the standard protocol during bolus administration of intravenous contrast. Multiplanar CT image reconstructions and MIPs were obtained to evaluate the vascular anatomy. Carotid stenosis measurements (when applicable) are obtained utilizing NASCET criteria, using the distal internal carotid diameter as the denominator. Multiphase CT imaging of the brain was performed following IV bolus contrast injection. Subsequent parametric perfusion maps were calculated using RAPID software. CONTRAST:  ISOVUE-370 IOPAMIDOL (ISOVUE-370) INJECTION 76% COMPARISON:  None. FINDINGS: CTA NECK FINDINGS Aortic arch: There is minimal calcific atherosclerosis of the aortic arch. There is no aneurysm, dissection or hemodynamically significant  stenosis of the visualized ascending aorta and aortic arch. Conventional 3 vessel aortic branching pattern. The visualized proximal subclavian arteries are normal. Right carotid system: The right common carotid origin is  widely patent. There is no common carotid or internal carotid artery dissection or aneurysm. No hemodynamically significant stenosis. Left carotid system: The left common carotid origin is widely patent. There is no common carotid or internal carotid artery dissection or aneurysm. No hemodynamically significant stenosis. Vertebral arteries: The vertebral system is left dominant. Both vertebral artery origins are normal. Both vertebral arteries are normal to their confluence with the basilar artery. Skeleton: There is no bony spinal canal stenosis. No lytic or blastic lesions. Other neck: The nasopharynx is clear. The oropharynx and hypopharynx are normal. The epiglottis is normal. The supraglottic larynx, glottis and subglottic larynx are normal. No retropharyngeal collection. The parapharyngeal spaces are preserved. The parotid and submandibular glands are normal. No sialolithiasis or salivary ductal dilatation. The thyroid gland is normal. There is no cervical lymphadenopathy. Upper chest: No pneumothorax or pleural effusion. No nodules or masses. Review of the MIP images confirms the above findings CTA HEAD FINDINGS Anterior circulation: --Intracranial internal carotid arteries: Normal. --Anterior cerebral arteries: Normal. --Middle cerebral arteries: Normal. --Posterior communicating arteries: Absent bilaterally. Posterior circulation: --Posterior cerebral arteries: Normal. --Superior cerebellar arteries: Normal. --Basilar artery: Normal. --Anterior inferior cerebellar arteries: Normal. --Posterior inferior cerebellar arteries: Normal. Venous sinuses: As permitted by contrast timing, patent. Anatomic variants: None Delayed phase: No parenchymal contrast enhancement. Review of the MIP images  confirms the above findings. CT Brain Perfusion Findings: CBF (<30%) Volume: 0mL Perfusion (Tmax>6.0s) volume: 48mL Mismatch Volume: 48mL Ischemia location:Bilateral deep watershed distribution IMPRESSION: 1. No emergent large vessel occlusion or high-grade stenosis. 2. Acute ischemia within the bilateral deep watershed zones. No core infarct per perfusion criteria 3.  Aortic Atherosclerosis (ICD10-I70.0). Electronically Signed   By: Paul Hernandez M.D.   On: 11/15/2017 20:10   Mr Brain Wo Contrast  Result Date: 11/16/2017 CLINICAL DATA:  Acute change in mental status and abnormal speech. Stroke. EXAM: MRI HEAD WITHOUT CONTRAST TECHNIQUE: Multiplanar, multiecho pulse sequences of the brain and surrounding structures were obtained without intravenous contrast. COMPARISON:  CTA of the head and neck 11/15/2017. CT head without contrast 11/15/2017. FINDINGS: Brain: A punctate acute cortical infarct is present in the left frontal lobe along the inferior frontal gyrus. This involves the pre motor area. No other acute infarct is present. Study is mildly degraded by patient motion. There is T2 signal change associated with this lesion suggesting a subacute time frame. No acute hemorrhage or mass lesion is present. Dilated perivascular spaces are present throughout the frontal lobes and basal ganglia bilaterally. The brainstem and cerebellum are normal. Vascular: Flow is present in the major intracranial arteries. Skull and upper cervical spine: The skull base is within normal limits. The craniocervical junction is normal. Midline sagittal structures are unremarkable. Degenerative disc disease is present at C3-4. Sinuses/Orbits: Mild mucosal thickening is present in the inferior maxillary sinuses bilaterally. Posterior left ethmoid sinus opacification is present. The remaining paranasal sinuses can the mastoid air cells are clear. IMPRESSION: 1. Punctate nonhemorrhagic cortical infarct involving the left inferior frontal  gyrus within the pre motor area. 2. No other acute or focal cortical abnormality. 3. Dilated perivascular spaces suggesting some degree of atrophy. 4. Inferior maxillary and posterior left ethmoid sinus disease as described. Electronically Signed   By: Paul Hernandez M.D.   On: 11/16/2017 12:31   Ct Cerebral Perfusion W Contrast  Result Date: 11/15/2017 CLINICAL DATA:  Altered mental status. EXAM: CT ANGIOGRAPHY HEAD AND NECK CT PERFUSION BRAIN TECHNIQUE: Multidetector CT imaging of the head and neck was performed using the  standard protocol during bolus administration of intravenous contrast. Multiplanar CT image reconstructions and MIPs were obtained to evaluate the vascular anatomy. Carotid stenosis measurements (when applicable) are obtained utilizing NASCET criteria, using the distal internal carotid diameter as the denominator. Multiphase CT imaging of the brain was performed following IV bolus contrast injection. Subsequent parametric perfusion maps were calculated using RAPID software. CONTRAST:  100mL ISOVUE-370 IOPAMIDOL (ISOVUE-370) INJECTION 76% COMPARISON:  None. FINDINGS: CTA NECK FINDINGS Aortic arch: There is minimal calcific atherosclerosis of the aortic arch. There is no aneurysm, dissection or hemodynamically significant stenosis of the visualized ascending aorta and aortic arch. Conventional 3 vessel aortic branching pattern. The visualized proximal subclavian arteries are normal. Right carotid system: The right common carotid origin is widely patent. There is no common carotid or internal carotid artery dissection or aneurysm. No hemodynamically significant stenosis. Left carotid system: The left common carotid origin is widely patent. There is no common carotid or internal carotid artery dissection or aneurysm. No hemodynamically significant stenosis. Vertebral arteries: The vertebral system is left dominant. Both vertebral artery origins are normal. Both vertebral arteries are normal  to their confluence with the basilar artery. Skeleton: There is no bony spinal canal stenosis. No lytic or blastic lesions. Other neck: The nasopharynx is clear. The oropharynx and hypopharynx are normal. The epiglottis is normal. The supraglottic larynx, glottis and subglottic larynx are normal. No retropharyngeal collection. The parapharyngeal spaces are preserved. The parotid and submandibular glands are normal. No sialolithiasis or salivary ductal dilatation. The thyroid gland is normal. There is no cervical lymphadenopathy. Upper chest: No pneumothorax or pleural effusion. No nodules or masses. Review of the MIP images confirms the above findings CTA HEAD FINDINGS Anterior circulation: --Intracranial internal carotid arteries: Normal. --Anterior cerebral arteries: Normal. --Middle cerebral arteries: Normal. --Posterior communicating arteries: Absent bilaterally. Posterior circulation: --Posterior cerebral arteries: Normal. --Superior cerebellar arteries: Normal. --Basilar artery: Normal. --Anterior inferior cerebellar arteries: Normal. --Posterior inferior cerebellar arteries: Normal. Venous sinuses: As permitted by contrast timing, patent. Anatomic variants: None Delayed phase: No parenchymal contrast enhancement. Review of the MIP images confirms the above findings. CT Brain Perfusion Findings: CBF (<30%) Volume: 0mL Perfusion (Tmax>6.0s) volume: 48mL Mismatch Volume: 48mL Ischemia location:Bilateral deep watershed distribution IMPRESSION: 1. No emergent large vessel occlusion or high-grade stenosis. 2. Acute ischemia within the bilateral deep watershed zones. No core infarct per perfusion criteria 3.  Aortic Atherosclerosis (ICD10-I70.0). Electronically Signed   By: Paul Hernandez M.D.   On: 11/15/2017 20:10   ASSESSMENT AND PLAN:  Paul ParcelDennis Iacovelli  is a [age] y.o. male who presents from group home with concern for possible aphasia.  Report by group home worker is that the patient was at the grocery store with  worker and had an acute "change" in his mental status and his speech  1.  Acute CVA, left frontal -pt came in with change in his speech.  Workup showed acute CVA. -Patient started on aspirin 325 p.o. daily.  He will continue his statins. -No neuro deficits. -Patient seen by neurology   2.Benign essential HTN -permissive hypertension -Resume blood pressure meds  3.  Diabetes type 2, controlled (HCC) -siding scale insulin with corresponding glucose checks  4.  Mental health disorder -unclear if his mental health disorder is schizophrenia or some other diagnosis.   -Seen by Dr. Toni Amendlapacs from psychiatry.  No further recommendations other than continue his meds as before  5.  Cardiomyopathy with history of long-standing history of hypertension.  Patient has seen Dr. Juliann Paresallwood before. Echo  shows EF of 30%.  No thrombus noted. Will have pt see cardiology to determine need for anticoagulation givne new CVA  NO PT/OT needs Case discussed with Care Management/Social Worker. Management plans discussed with the patient, family and they are in agreement.  CODE STATUS: FULL  DVT Prophylaxis: asa/lovenox  TOTAL TIME TAKING CARE OF THIS PATIENT: .  >50% time spent on counselling and coordination of care  POSSIBLE D/C IN 1-2 DAYS, DEPENDING ON CLINICAL CONDITION.  Note: This dictation was prepared with Dragon dictation along with smaller phrase technology. Any transcriptional errors that result from this process are unintentional.  Paul Hernandez M.D on 11/16/2017 at 3:11 PM  Between 7am to 6pm - Pager - [phone]  After 6pm go to [email or web address] - Social research officer, government  [facility]  Office  [phone]  CC: Primary care physician; Paul Hernandez, NPPatient ID: Paul Hernandez, male   DOB: [birth year], 52 y.o.   MRN: [id number]

## 2017-11-16 NOTE — [place] FL2 (Signed)
[place] MEDICAID FL2 LEVEL OF CARE SCREENING TOOL     IDENTIFICATION  Patient Name: Paul ParcelDennis Lokken Birthdate: 10-07-1966 Sex: male Admission Date (Current Location): 11/15/2017  [facility] [place] [place]  [place] ([id number]) Facility and Address:  [facility], [address], [place], [place] [address]      Provider Number: [id number]  Attending Physician Name and Address:  Enedina FinnerPatel, Sona, MD  Relative Name and Phone Number:       Current Level of Care: Hospital Recommended Level of Care: Family Care Home Prior Approval Number:    Date Approved/Denied:   PASRR Number:    Discharge Plan: Domiciliary (Rest home)    Current Diagnoses: Patient Active Problem List   Diagnosis Date Noted  . Schizophrenia (HCC) 11/16/2017  . COPD (chronic obstructive pulmonary disease) (HCC) 11/15/2017  . Mental health disorder 11/15/2017  . Cerebral ischemia 11/15/2017  . Cerebral infarct (HCC) 11/15/2017  . Benign essential HTN 11/13/2017  . Diabetes type 2, controlled (HCC) 11/13/2017  . COPD exacerbation (HCC) 09/16/2017    Orientation RESPIRATION BLADDER Height & Weight     Self, Time, Situation, Place  Normal Continent Weight: 226 lb 4.8 oz (102.6 kg) Height:  5\' 11"  (180.3 cm)  BEHAVIORAL SYMPTOMS/MOOD NEUROLOGICAL BOWEL NUTRITION STATUS      Continent Diet(Diet: Heart Healthy/ Carb Modified. )  AMBULATORY STATUS COMMUNICATION OF NEEDS Skin   Independent Verbally Normal                       Personal Care Assistance Level of Assistance  Bathing, Feeding, Dressing Bathing Assistance: Independent Feeding assistance: Independent Dressing Assistance: Independent     Functional Limitations Info  Sight, Hearing, Speech Sight Info: Adequate Hearing Info: Adequate Speech Info: Adequate    SPECIAL CARE FACTORS FREQUENCY                       Contractures      Additional Factors Info  Code Status, Allergies Code Status Info:  (Full Code. ) Allergies Info: (Asa Aspirin)          Discharge Medications: Please see discharge summary for a list of discharge medications. Medication List     STOP taking these medications   amLODipine 10 MG tablet Commonly known as:  NORVASC   metoprolol 200 MG 24 hr tablet Commonly known as:  TOPROL-XL     TAKE these medications   acetaminophen 325 MG tablet Commonly known as:  TYLENOL Take 650 mg by mouth every 6 (six) hours as needed.   albuterol (2.5 MG/3ML) 0.083% nebulizer solution Commonly known as:  PROVENTIL Take 2.5 mg by nebulization 4 (four) times daily.   albuterol 108 (90 Base) MCG/ACT inhaler Commonly known as:  PROVENTIL HFA;VENTOLIN HFA Inhale 2 puffs into the lungs every 6 (six) hours as needed for wheezing or shortness of breath.   amLODipine-benazepril 10-20 MG capsule Commonly known as:  LOTREL Take 1 capsule by mouth every evening.   aspirin EC 325 MG tablet Take 1 tablet (325 mg total) by mouth daily.   atorvastatin 40 MG tablet Commonly known as:  LIPITOR Take 40 mg by mouth at bedtime.   benazepril 20 MG tablet Commonly known as:  LOTENSIN Take 1 tablet (20 mg total) by mouth daily.   benztropine 2 MG tablet Commonly known as:  COGENTIN Take 2 mg by mouth at bedtime.   carvedilol 25 MG tablet Commonly known as:  COREG Take 1 tablet (25  mg total) by mouth 2 (two) times daily with a meal.   cloNIDine 0.3 MG tablet Commonly known as:  CATAPRES Take 0.3 mg by mouth every 8 (eight) hours.   dextrose 40 % Gel Commonly known as:  GLUTOSE Take by mouth once as needed for low blood sugar. For blood sugar <70   diphenhydrAMINE 25 mg capsule Commonly known as:  BENADRYL Take 25 mg by mouth at bedtime.   haloperidol decanoate 100 MG/ML injection Commonly known as:  HALDOL DECANOATE Inject 90 mg into the muscle every 28 (twenty-eight) days.   metFORMIN 500 MG tablet Commonly known as:  GLUCOPHAGE Take 1,000-1,500  mg by mouth daily with breakfast. Take 1500 mg in the morning and 1,000 mg at bedtime.   triamterene-hydrochlorothiazide 75-50 MG tablet Commonly known as:  MAXZIDE Take 1 tablet by mouth every morning.   Vitamin D3 5000 units Caps Take 5,000 Units by mouth daily.  Relevant Imaging Results: Relevant Lab Results: Additional Information (SSN: [id number])  Lavance Beazer, Darleen Crocker, LCSW

## 2017-11-16 NOTE — Discharge Summary (Signed)
[facility] [place] [facility]   PATIENT NAME: Paul Hernandez    MR#:  [id number]  DATE OF BIRTH:  05-13-1966  DATE OF ADMISSION:  11/15/2017 ADMITTING PHYSICIAN: Oralia Manis, MD  DATE OF DISCHARGE: 11/16/2017  PRIMARY CARE PHYSICIAN: Anselm Jungling, NP    ADMISSION DIAGNOSIS:  Other speech disturbance [R47.89] Altered mental status, unspecified altered mental status type [R41.82]  DISCHARGE DIAGNOSIS:  Acute CVA,left frontal lobe -HTN Cardiomyopathy EF 30%  SECONDARY DIAGNOSIS:   Past Medical History:  Diagnosis Date  . Diabetes mellitus without complication (HCC)   . Hypertension   . Mental health disorder     HOSPITAL COURSE:  DennisTayloris a51 y.o.malewho presents from group home with concern for possible aphasia. Report by group home worker is that the patient was at the grocery store with worker and had an acute "change" in his mental status and his speech  1.  Acute CVA, left frontal -pt came in with change in his speech.  Workup showed acute CVA. -Patient started on aspirin 325 p.o. daily.  He will continue his statins. -No neuro deficits--speech clear -Patient seen by neurology  2.Benign essential HTN -permissive hypertension -Resume blood pressure meds  3.Diabetes type 2, controlled (HCC) -siding scale insulin with corresponding glucose checks -sugars are well controlled with metformin  4.Mental health disorder -unclear if his mental health disorder is schizophrenia or some other diagnosis.  -Seen by Dr. Toni Hernandez from psychiatry.  No further recommendations other than continue his meds as before  5.  Cardiomyopathy with history of long-standing history of hypertension.  Patient has seen Dr. Juliann Pares before. Echo shows EF of 30%.  No thrombus noted. Spoke with Dr Callwood,cardiology to determine need for anticoagulation givne new CVA. Case discussed. For now cont full dose asa and pt will f/u as out pt with  cardiology for further recommendations.  D/c back to group home  NO PT/OT needs    CONSULTS OBTAINED:  Treatment Team:  Thana Farr, MD Clapacs, Jackquline Denmark, MD Alwyn Pea, MD  DRUG ALLERGIES:   Allergies  Allergen Reactions  . Asa [Aspirin]     DISCHARGE MEDICATIONS:   Allergies as of 11/16/2017      Reactions   Asa [aspirin]       Medication List    STOP taking these medications   amLODipine 10 MG tablet Commonly known as:  NORVASC   metoprolol 200 MG 24 hr tablet Commonly known as:  TOPROL-XL     TAKE these medications   acetaminophen 325 MG tablet Commonly known as:  TYLENOL Take 650 mg by mouth every 6 (six) hours as needed.   albuterol (2.5 MG/3ML) 0.083% nebulizer solution Commonly known as:  PROVENTIL Take 2.5 mg by nebulization 4 (four) times daily.   albuterol 108 (90 Base) MCG/ACT inhaler Commonly known as:  PROVENTIL HFA;VENTOLIN HFA Inhale 2 puffs into the lungs every 6 (six) hours as needed for wheezing or shortness of breath.   amLODipine-benazepril 10-20 MG capsule Commonly known as:  LOTREL Take 1 capsule by mouth every evening.   aspirin EC 325 MG tablet Take 1 tablet (325 mg total) by mouth daily.   atorvastatin 40 MG tablet Commonly known as:  LIPITOR Take 40 mg by mouth at bedtime.   benazepril 20 MG tablet Commonly known as:  LOTENSIN Take 1 tablet (20 mg total) by mouth daily.   benztropine 2 MG tablet Commonly known as:  COGENTIN Take 2 mg by mouth at bedtime.  carvedilol 25 MG tablet Commonly known as:  COREG Take 1 tablet (25 mg total) by mouth 2 (two) times daily with a meal.   cloNIDine 0.3 MG tablet Commonly known as:  CATAPRES Take 0.3 mg by mouth every 8 (eight) hours.   dextrose 40 % Gel Commonly known as:  GLUTOSE Take by mouth once as needed for low blood sugar. For blood sugar <70   diphenhydrAMINE 25 mg capsule Commonly known as:  BENADRYL Take 25 mg by mouth at bedtime.   haloperidol  decanoate 100 MG/ML injection Commonly known as:  HALDOL DECANOATE Inject 90 mg into the muscle every 28 (twenty-eight) days.   metFORMIN 500 MG tablet Commonly known as:  GLUCOPHAGE Take 1,000-1,500 mg by mouth daily with breakfast. Take 1500 mg in the morning and 1,000 mg at bedtime.   triamterene-hydrochlorothiazide 75-50 MG tablet Commonly known as:  MAXZIDE Take 1 tablet by mouth every morning.   Vitamin D3 5000 units Caps Take 5,000 Units by mouth daily.       If you experience worsening of your admission symptoms, develop shortness of breath, life threatening emergency, suicidal or homicidal thoughts you must seek medical attention immediately by calling 911 or calling your MD immediately  if symptoms less severe.  You Must read complete instructions/literature along with all the possible adverse reactions/side effects for all the Medicines you take and that have been prescribed to you. Take any new Medicines after you have completely understood and accept all the possible adverse reactions/side effects.   Please note  You were cared for by a hospitalist during your hospital stay. If you have any questions about your discharge medications or the care you received while you were in the hospital after you are discharged, you can call the unit and asked to speak with the hospitalist on call if the hospitalist that took care of you is not available. Once you are discharged, your primary care physician will handle any further medical issues. Please note that NO REFILLS for any discharge medications will be authorized once you are discharged, as it is imperative that you return to your primary care physician (or establish a relationship with a primary care physician if you do not have one) for your aftercare needs so that they can reassess your need for medications and monitor your lab values.   DATA REVIEW:   CBC  Recent Labs  Lab 11/16/17 0503  WBC 5.0  HGB 14.3  HCT 44.2  PLT  257    Chemistries  Recent Labs  Lab 11/15/17 1819  NA 140  K 3.9  CL 104  CO2 27  GLUCOSE 190*  BUN 15  CREATININE 0.97  CALCIUM 9.3  AST 20  ALT 14*  ALKPHOS 72  BILITOT 0.6    Microbiology Results   Recent Results (from the past 240 hour(s))  MRSA PCR Screening     Status: None   Collection Time: 11/16/17 12:10 AM  Result Value Ref Range Status   MRSA by PCR NEGATIVE NEGATIVE Final    Comment:        The GeneXpert MRSA Assay (FDA approved for NASAL specimens only), is one component of a comprehensive MRSA colonization surveillance program. It is not intended to diagnose MRSA infection nor to guide or monitor treatment for MRSA infections. Performed at [facility], [address] [place], [place] [address]     RADIOLOGY:  Ct Angio Head W Or Wo Contrast  Result Date: 11/15/2017 CLINICAL DATA:  Altered  mental status. EXAM: CT ANGIOGRAPHY HEAD AND NECK CT PERFUSION BRAIN TECHNIQUE: Multidetector CT imaging of the head and neck was performed using the standard protocol during bolus administration of intravenous contrast. Multiplanar CT image reconstructions and MIPs were obtained to evaluate the vascular anatomy. Carotid stenosis measurements (when applicable) are obtained utilizing NASCET criteria, using the distal internal carotid diameter as the denominator. Multiphase CT imaging of the brain was performed following IV bolus contrast injection. Subsequent parametric perfusion maps were calculated using RAPID software. CONTRAST:  ISOVUE-370 IOPAMIDOL (ISOVUE-370) INJECTION 76% COMPARISON:  None. FINDINGS: CTA NECK FINDINGS Aortic arch: There is minimal calcific atherosclerosis of the aortic arch. There is no aneurysm, dissection or hemodynamically significant stenosis of the visualized ascending aorta and aortic arch. Conventional 3 vessel aortic branching pattern. The visualized proximal subclavian arteries are normal. Right carotid system: The right  common carotid origin is widely patent. There is no common carotid or internal carotid artery dissection or aneurysm. No hemodynamically significant stenosis. Left carotid system: The left common carotid origin is widely patent. There is no common carotid or internal carotid artery dissection or aneurysm. No hemodynamically significant stenosis. Vertebral arteries: The vertebral system is left dominant. Both vertebral artery origins are normal. Both vertebral arteries are normal to their confluence with the basilar artery. Skeleton: There is no bony spinal canal stenosis. No lytic or blastic lesions. Other neck: The nasopharynx is clear. The oropharynx and hypopharynx are normal. The epiglottis is normal. The supraglottic larynx, glottis and subglottic larynx are normal. No retropharyngeal collection. The parapharyngeal spaces are preserved. The parotid and submandibular glands are normal. No sialolithiasis or salivary ductal dilatation. The thyroid gland is normal. There is no cervical lymphadenopathy. Upper chest: No pneumothorax or pleural effusion. No nodules or masses. Review of the MIP images confirms the above findings CTA HEAD FINDINGS Anterior circulation: --Intracranial internal carotid arteries: Normal. --Anterior cerebral arteries: Normal. --Middle cerebral arteries: Normal. --Posterior communicating arteries: Absent bilaterally. Posterior circulation: --Posterior cerebral arteries: Normal. --Superior cerebellar arteries: Normal. --Basilar artery: Normal. --Anterior inferior cerebellar arteries: Normal. --Posterior inferior cerebellar arteries: Normal. Venous sinuses: As permitted by contrast timing, patent. Anatomic variants: None Delayed phase: No parenchymal contrast enhancement. Review of the MIP images confirms the above findings. CT Brain Perfusion Findings: CBF (<30%) Volume: 0mL Perfusion (Tmax>6.0s) volume: 48mL Mismatch Volume: 48mL Ischemia location:Bilateral deep watershed distribution  IMPRESSION: 1. No emergent large vessel occlusion or high-grade stenosis. 2. Acute ischemia within the bilateral deep watershed zones. No core infarct per perfusion criteria 3.  Aortic Atherosclerosis (ICD10-I70.0). Electronically Signed   By: Deatra Robinson M.D.   On: 11/15/2017 20:10   Ct Head Wo Contrast  Result Date: 11/15/2017 CLINICAL DATA:  Altered mental status for a few hours with altered speech EXAM: CT HEAD WITHOUT CONTRAST TECHNIQUE: Contiguous axial images were obtained from the base of the skull through the vertex without intravenous contrast. COMPARISON:  None. FINDINGS: Brain: No evidence of acute infarction, hemorrhage, hydrocephalus, extra-axial collection or mass lesion/mass effect. Vascular: No hyperdense vessel or unexpected calcification. Skull: Normal. Negative for fracture or focal lesion. Sinuses/Orbits: No acute finding. Other: None. IMPRESSION: No acute abnormality noted. Electronically Signed   By: Alcide Clever M.D.   On: 11/15/2017 18:37   Ct Angio Neck W Or Wo Contrast  Result Date: 11/15/2017 CLINICAL DATA:  Altered mental status. EXAM: CT ANGIOGRAPHY HEAD AND NECK CT PERFUSION BRAIN TECHNIQUE: Multidetector CT imaging of the head and neck was performed using the standard protocol during bolus administration of  intravenous contrast. Multiplanar CT image reconstructions and MIPs were obtained to evaluate the vascular anatomy. Carotid stenosis measurements (when applicable) are obtained utilizing NASCET criteria, using the distal internal carotid diameter as the denominator. Multiphase CT imaging of the brain was performed following IV bolus contrast injection. Subsequent parametric perfusion maps were calculated using RAPID software. CONTRAST:  ISOVUE-370 IOPAMIDOL (ISOVUE-370) INJECTION 76% COMPARISON:  None. FINDINGS: CTA NECK FINDINGS Aortic arch: There is minimal calcific atherosclerosis of the aortic arch. There is no aneurysm, dissection or hemodynamically significant  stenosis of the visualized ascending aorta and aortic arch. Conventional 3 vessel aortic branching pattern. The visualized proximal subclavian arteries are normal. Right carotid system: The right common carotid origin is widely patent. There is no common carotid or internal carotid artery dissection or aneurysm. No hemodynamically significant stenosis. Left carotid system: The left common carotid origin is widely patent. There is no common carotid or internal carotid artery dissection or aneurysm. No hemodynamically significant stenosis. Vertebral arteries: The vertebral system is left dominant. Both vertebral artery origins are normal. Both vertebral arteries are normal to their confluence with the basilar artery. Skeleton: There is no bony spinal canal stenosis. No lytic or blastic lesions. Other neck: The nasopharynx is clear. The oropharynx and hypopharynx are normal. The epiglottis is normal. The supraglottic larynx, glottis and subglottic larynx are normal. No retropharyngeal collection. The parapharyngeal spaces are preserved. The parotid and submandibular glands are normal. No sialolithiasis or salivary ductal dilatation. The thyroid gland is normal. There is no cervical lymphadenopathy. Upper chest: No pneumothorax or pleural effusion. No nodules or masses. Review of the MIP images confirms the above findings CTA HEAD FINDINGS Anterior circulation: --Intracranial internal carotid arteries: Normal. --Anterior cerebral arteries: Normal. --Middle cerebral arteries: Normal. --Posterior communicating arteries: Absent bilaterally. Posterior circulation: --Posterior cerebral arteries: Normal. --Superior cerebellar arteries: Normal. --Basilar artery: Normal. --Anterior inferior cerebellar arteries: Normal. --Posterior inferior cerebellar arteries: Normal. Venous sinuses: As permitted by contrast timing, patent. Anatomic variants: None Delayed phase: No parenchymal contrast enhancement. Review of the MIP images  confirms the above findings. CT Brain Perfusion Findings: CBF (<30%) Volume: 0mL Perfusion (Tmax>6.0s) volume: 48mL Mismatch Volume: 48mL Ischemia location:Bilateral deep watershed distribution IMPRESSION: 1. No emergent large vessel occlusion or high-grade stenosis. 2. Acute ischemia within the bilateral deep watershed zones. No core infarct per perfusion criteria 3.  Aortic Atherosclerosis (ICD10-I70.0). Electronically Signed   By: Deatra Robinson M.D.   On: 11/15/2017 20:10   Mr Brain Wo Contrast  Result Date: 11/16/2017 CLINICAL DATA:  Acute change in mental status and abnormal speech. Stroke. EXAM: MRI HEAD WITHOUT CONTRAST TECHNIQUE: Multiplanar, multiecho pulse sequences of the brain and surrounding structures were obtained without intravenous contrast. COMPARISON:  CTA of the head and neck 11/15/2017. CT head without contrast 11/15/2017. FINDINGS: Brain: A punctate acute cortical infarct is present in the left frontal lobe along the inferior frontal gyrus. This involves the pre motor area. No other acute infarct is present. Study is mildly degraded by patient motion. There is T2 signal change associated with this lesion suggesting a subacute time frame. No acute hemorrhage or mass lesion is present. Dilated perivascular spaces are present throughout the frontal lobes and basal ganglia bilaterally. The brainstem and cerebellum are normal. Vascular: Flow is present in the major intracranial arteries. Skull and upper cervical spine: The skull base is within normal limits. The craniocervical junction is normal. Midline sagittal structures are unremarkable. Degenerative disc disease is present at C3-4. Sinuses/Orbits: Mild mucosal thickening is present in  the inferior maxillary sinuses bilaterally. Posterior left ethmoid sinus opacification is present. The remaining paranasal sinuses can the mastoid air cells are clear. IMPRESSION: 1. Punctate nonhemorrhagic cortical infarct involving the left inferior frontal  gyrus within the pre motor area. 2. No other acute or focal cortical abnormality. 3. Dilated perivascular spaces suggesting some degree of atrophy. 4. Inferior maxillary and posterior left ethmoid sinus disease as described. Electronically Signed   By: Marin Roberts M.D.   On: 11/16/2017 12:31   Ct Cerebral Perfusion W Contrast  Result Date: 11/15/2017 CLINICAL DATA:  Altered mental status. EXAM: CT ANGIOGRAPHY HEAD AND NECK CT PERFUSION BRAIN TECHNIQUE: Multidetector CT imaging of the head and neck was performed using the standard protocol during bolus administration of intravenous contrast. Multiplanar CT image reconstructions and MIPs were obtained to evaluate the vascular anatomy. Carotid stenosis measurements (when applicable) are obtained utilizing NASCET criteria, using the distal internal carotid diameter as the denominator. Multiphase CT imaging of the brain was performed following IV bolus contrast injection. Subsequent parametric perfusion maps were calculated using RAPID software. CONTRAST:  ISOVUE-370 IOPAMIDOL (ISOVUE-370) INJECTION 76% COMPARISON:  None. FINDINGS: CTA NECK FINDINGS Aortic arch: There is minimal calcific atherosclerosis of the aortic arch. There is no aneurysm, dissection or hemodynamically significant stenosis of the visualized ascending aorta and aortic arch. Conventional 3 vessel aortic branching pattern. The visualized proximal subclavian arteries are normal. Right carotid system: The right common carotid origin is widely patent. There is no common carotid or internal carotid artery dissection or aneurysm. No hemodynamically significant stenosis. Left carotid system: The left common carotid origin is widely patent. There is no common carotid or internal carotid artery dissection or aneurysm. No hemodynamically significant stenosis. Vertebral arteries: The vertebral system is left dominant. Both vertebral artery origins are normal. Both vertebral arteries are normal  to their confluence with the basilar artery. Skeleton: There is no bony spinal canal stenosis. No lytic or blastic lesions. Other neck: The nasopharynx is clear. The oropharynx and hypopharynx are normal. The epiglottis is normal. The supraglottic larynx, glottis and subglottic larynx are normal. No retropharyngeal collection. The parapharyngeal spaces are preserved. The parotid and submandibular glands are normal. No sialolithiasis or salivary ductal dilatation. The thyroid gland is normal. There is no cervical lymphadenopathy. Upper chest: No pneumothorax or pleural effusion. No nodules or masses. Review of the MIP images confirms the above findings CTA HEAD FINDINGS Anterior circulation: --Intracranial internal carotid arteries: Normal. --Anterior cerebral arteries: Normal. --Middle cerebral arteries: Normal. --Posterior communicating arteries: Absent bilaterally. Posterior circulation: --Posterior cerebral arteries: Normal. --Superior cerebellar arteries: Normal. --Basilar artery: Normal. --Anterior inferior cerebellar arteries: Normal. --Posterior inferior cerebellar arteries: Normal. Venous sinuses: As permitted by contrast timing, patent. Anatomic variants: None Delayed phase: No parenchymal contrast enhancement. Review of the MIP images confirms the above findings. CT Brain Perfusion Findings: CBF (<30%) Volume: 0mL Perfusion (Tmax>6.0s) volume: 48mL Mismatch Volume: 48mL Ischemia location:Bilateral deep watershed distribution IMPRESSION: 1. No emergent large vessel occlusion or high-grade stenosis. 2. Acute ischemia within the bilateral deep watershed zones. No core infarct per perfusion criteria 3.  Aortic Atherosclerosis (ICD10-I70.0). Electronically Signed   By: Deatra Robinson M.D.   On: 11/15/2017 20:10     Management plans discussed with the patient, family and they are in agreement.  CODE STATUS:     Code Status Orders  (From admission, onward)        Start     Ordered   11/15/17 2243   Full code  Continuous  11/15/17 2242    Code Status History    Date Active Date Inactive Code Status Order ID Comments User Context   09/16/2017 15:34 09/18/2017 14:40 Full Code [id number]  Alford Highland, MD ED      TOTAL TIME TAKING CARE OF THIS PATIENT: *40* minutes.    Enedina Finner M.D on 11/16/2017 at 4:14 PM  Between 7am to 6pm - Pager - [phone] After 6pm go to [email or web address] - Social research officer, government  [facility]  Office  [phone]  CC: Primary care physician; Anselm Jungling, NP

## 2017-11-16 NOTE — Progress Notes (Signed)
SLP Cancellation Note  Patient Details Name: Paul Hernandez MRN: [id number] DOB: 01-22-1966   Cancelled treatment:       Reason Eval/Treat Not Completed: SLP screened, no needs identified, will sign off(chart reviewed; consulted NSG then met w/ pt). Pt denied any difficulty swallowing and is currently on a regular diet; tolerates swallowing pills w/ water per NSG. Pt conversed at conversational level w/ no overt Aphasia noted; pt answered basic question and laughed often; pt has intellectual disability premorbid as well as dx of schizophrenia. .  No further skilled ST services indicated as pt appears at his baseline. Pt agreed. NSG to reconsult if any change in status.     Orinda Kenner, MS, CCC-SLP Watson,Katherine 11/16/2017, 10:53 AM

## 2017-11-16 NOTE — Progress Notes (Signed)
Clinical Social Worker (CSW) discussed case with Dr. Toni Amendlapacs who stated that he will discontinue IVC. RN aware of above.   [employer] Genieve Ramaswamy, LCSW [phone]

## 2017-11-16 NOTE — Clinical Social Work Note (Signed)
Clinical Social Work Assessment  Patient Details  Name: Paul Hernandez MRN: [id number] Date of Birth: 06-Jun-1966  Date of referral:  11/16/17               Reason for consult:  Other (Comment Required)(From [facility]. )                Permission sought to share information with:  Facility Art therapist granted to share information::  Yes, Verbal Permission Granted  Name::        Agency::     Relationship::     Contact Information:     Housing/Transportation Living arrangements for the past 2 months:  Group Home Source of Information:  Patient, Facility Patient Interpreter Needed:  None Criminal Activity/Legal Involvement Pertinent to Current Situation/Hospitalization:  No - Comment as needed Significant Relationships:  Other(Comment)(Facility ) Lives with:  Facility Resident Do you feel safe going back to the place where you live?  Yes Need for family participation in patient care:  Yes (Comment)  Care giving concerns:  Patient is currently living at a [facility] group home located at [address], [place], [place] (phone & fax # [phone]).    Social Worker assessment / plan:  Holiday representative (CSW) reviewed chart and noted that patient is from [place]. CSW contacted the group home main # [phone] and spoke with [facility] cell [phone]. Per Paul Hernandez patient has been at [facility] for 1 week now because his previous group home [facility] needed home repairs and the state shut it down temporarily. Per Paul Hernandez her daughter Paul Hernandez [phone] is the owner of [facility] and will be the contact when patient discharges from [facility]. CSW contacted the owner Paul Hernandez and made her aware of above. Per Paul Hernandez patient can return to [facility] group home and she will arrange transport from [facility]. CSW also contacted Paul Hernandez [phone]. Per Paul Hernandez him and his wife Paul Hernandez have been  "looking out for patient for a long time." Paul Hernandez reported that patient does not have a guardian and he is considering being patient's guardian because he has no family involved. Paul Hernandez reported that he is the owner of [facility] home however he is still very involved with the patient's care. Per Paul Hernandez patient will return to [facility] at discharge. CSW met with patient to discuss D/C plan. Patient was very pleasant and stated that he is staying at [facility] until Paul Hernandez can have repairs made. Patient is agreeable to return to [facility]. CSW will continue to follow and assist as needed.   Employment status:  Disabled (Comment on whether or not currently receiving Disability) Insurance information:  Medicare, [place] PT Recommendations:  No Follow Up Information / Referral to community resources:  Other (Comment Required)(Patient will D/C back to group home. )  Patient/Family's Response to care:  Patient is agreeable to return to his group home.   Patient/Family's Understanding of and Emotional Response to Diagnosis, Current Treatment, and Prognosis:  Patient was very pleasant and thanked CSW for visit.   Emotional Assessment Appearance:  Appears stated age Attitude/Demeanor/Rapport:    Affect (typically observed):  Pleasant Orientation:  Oriented to Self, Oriented to Place, Oriented to  Time, Oriented to Situation Alcohol / Substance use:  Not Applicable Psych involvement (Current and /or in  the community):  No (Comment)  Discharge Needs  Concerns to be addressed:  Discharge Planning Concerns Readmission within the last 30 days:  No Current discharge risk:  Psychiatric Illness Barriers to Discharge:  Continued Medical Work up   [employer], Veronia Beets, LCSW 11/16/2017, 12:54 PM

## 2017-11-16 NOTE — Progress Notes (Signed)
PT Cancellation Note  Patient Details Name: Durward ParcelDennis Colomb MRN: [id number] DOB: 01-08-1966   Cancelled Treatment:    Reason Eval/Treat Not Completed: PT screened, no needs identified, will sign off Pt able to literally go from supine to standing in one dynamic motion and was easily able ambulate >500 ft w/o AD, w/o LOBs and w/o significant fatigue. No further PT needs.  Malachi ProGalen R Aayliah Rotenberry, DPT 11/16/2017, 9:48 AM

## 2017-11-29 ENCOUNTER — Encounter: Payer: Self-pay | Admitting: Internal Medicine

## 2017-11-29 ENCOUNTER — Ambulatory Visit (INDEPENDENT_AMBULATORY_CARE_PROVIDER_SITE_OTHER): Payer: Medicare Other | Admitting: Internal Medicine

## 2017-11-29 VITALS — BP 118/90 | HR 104 | Resp 16 | Ht 71.0 in | Wt 224.0 lb

## 2017-11-29 DIAGNOSIS — J42 Unspecified chronic bronchitis: Secondary | ICD-10-CM

## 2017-11-29 DIAGNOSIS — I10 Essential (primary) hypertension: Secondary | ICD-10-CM | POA: Insufficient documentation

## 2017-11-29 DIAGNOSIS — F17210 Nicotine dependence, cigarettes, uncomplicated: Secondary | ICD-10-CM

## 2017-11-29 DIAGNOSIS — E785 Hyperlipidemia, unspecified: Secondary | ICD-10-CM | POA: Insufficient documentation

## 2017-11-29 NOTE — Progress Notes (Signed)
[facility] Consultation      Assessment and Plan:  Chronic bronchitis with recent hospital admission for AECOPD.  --Recommend that he quit smoking, I think this alone would help his breathing.  --We discussed starting an inhaler such as spiriva, but he would prefer not to and feels that he can stop smoking.  --However if his breathing worsens or he does not stop smoking would recommend starting an inhaler in the future.   Nicotine abuse.  --Discussed smoking cessation, spent 3 min in discussion.    Date: 11/29/2017  MRN# [id number] Paul Hernandez [birth year]    Paul Hernandez is a 52 y.o. old male seen in consultation for chief complaint of:    Chief Complaint  Patient presents with  . Advice Only    Referred by Anselm Jungling[facility] Hatchett doctors making house call for eval  . COPD    HPI:  He has occasional dyspnea, he is a smoker of about half ppd. He has trouble providing history, he feels that his main problem is that he has not had sex in a very long time.  He was recently in the hospital,  [facility] on 09/18/18 for AECOPD and Htn.  He currently does not use any inhalers at his group home. He does some walking inside and around the house, but not much else, he does not feel limited by breathing.   Desat walk today at rest on RA, sat was 97% and HR 101; walked 180 feet without dyspnea he was conversational, sat was 96% and HR 108.   Imaging personally reviewed, CXR 10/25/17; lung unremarkable.    PMHX:   Past Medical History:  Diagnosis Date  . Diabetes mellitus without complication (HCC)   . Hypertension   . Mental health disorder    Surgical Hx:  Past Surgical History:  Procedure Laterality Date  . NO PAST SURGERIES     Family Hx:  Family History  Problem Relation Age of Onset  . CVA Mother   . CAD Father    Social Hx:   Social History   Tobacco Use  . Smoking status: Current Some Day Smoker    Packs/day: 0.50  . Smokeless tobacco: Never Used    Substance Use Topics  . Alcohol use: No  . Drug use: No   Medication:    Current Outpatient Medications:  .  acetaminophen (TYLENOL) 325 MG tablet, Take 650 mg by mouth every 6 (six) hours as needed., Disp: , Rfl:  .  albuterol (PROVENTIL HFA;VENTOLIN HFA) 108 (90 Base) MCG/ACT inhaler, Inhale 2 puffs into the lungs every 6 (six) hours as needed for wheezing or shortness of breath., Disp: 1 Inhaler, Rfl: 2 .  albuterol (PROVENTIL) (2.5 MG/3ML) 0.083% nebulizer solution, Take 2.5 mg by nebulization 4 (four) times daily., Disp: , Rfl:  .  amLODipine-benazepril (LOTREL) 10-20 MG capsule, Take 1 capsule by mouth every evening. , Disp: , Rfl:  .  aspirin EC 325 MG tablet, Take 1 tablet (325 mg total) by mouth daily., Disp: 30 tablet, Rfl: 0 .  atorvastatin (LIPITOR) 40 MG tablet, Take 40 mg by mouth at bedtime. , Disp: , Rfl:  .  benazepril (LOTENSIN) 20 MG tablet, Take 1 tablet (20 mg total) by mouth daily., Disp: 30 tablet, Rfl: 1 .  benztropine (COGENTIN) 2 MG tablet, Take 2 mg by mouth at bedtime., Disp: , Rfl:  .  carvedilol (COREG) 25 MG tablet, Take 1 tablet (25 mg total) by mouth 2 (two) times daily with a  meal., Disp: 60 tablet, Rfl: 0 .  Cholecalciferol (VITAMIN D3) 5000 units CAPS, Take 5,000 Units by mouth daily., Disp: , Rfl:  .  cloNIDine (CATAPRES) 0.3 MG tablet, Take 0.3 mg by mouth every 8 (eight) hours. , Disp: , Rfl:  .  dextrose (GLUTOSE) 40 % GEL, Take by mouth once as needed for low blood sugar. For blood sugar <70, Disp: , Rfl:  .  diphenhydrAMINE (BENADRYL) 25 mg capsule, Take 25 mg by mouth at bedtime. , Disp: , Rfl:  .  haloperidol decanoate (HALDOL DECANOATE) 100 MG/ML injection, Inject 90 mg into the muscle every 28 (twenty-eight) days. , Disp: , Rfl:  .  metFORMIN (GLUCOPHAGE) 500 MG tablet, Take 1,000-1,500 mg by mouth daily with breakfast. Take 1500 mg in the morning and 1,000 mg at bedtime., Disp: , Rfl:  .  metoprolol (TOPROL-XL) 200 MG 24 hr tablet, Take 400 mg by  mouth daily., Disp: , Rfl:  .  triamterene-hydrochlorothiazide (MAXZIDE) 75-50 MG tablet, Take 1 tablet by mouth every morning. , Disp: , Rfl:    Allergies:  Asa [aspirin]  Review of Systems: Gen:  Denies  fever, sweats, chills HEENT: Denies blurred vision, double vision. bleeds, sore throat Cvc:  No dizziness, chest pain. Resp:   Denies cough or sputum production, shortness of breath Gi: Denies swallowing difficulty, stomach pain. Gu:  Denies bladder incontinence, burning urine Ext:   No Joint pain, stiffness. Skin: No skin rash,  hives  Endoc:  No polyuria, polydipsia. Psych: No depression, insomnia. Other:  All other systems were reviewed with the patient and were negative other that what is mentioned in the HPI.   Physical Examination:   VS: BP 118/90 (BP Location: Left Arm, Cuff Size: Large)   Pulse (!) 104   Resp 16   Ht 5\' 11"  (1.803 m)   Wt 224 lb (101.6 kg)   SpO2 96%   BMI 31.24 kg/m   General Appearance: No distress  Neuro:without focal findings,  speech normal,  HEENT: PERRLA, EOM intact.   Pulmonary: normal breath sounds, No wheezing.  CardiovascularNormal S1,S2.  No m/r/g.   Abdomen: Benign, Soft, non-tender. Renal:  No costovertebral tenderness  GU:  No performed at this time. Endoc: No evident thyromegaly, no signs of acromegaly. Skin:   warm, no rashes, no ecchymosis  Extremities: normal, no cyanosis, clubbing.  Other findings:    LABORATORY PANEL:   CBC No results for input(s): WBC, HGB, HCT, PLT in the last 168 hours. ------------------------------------------------------------------------------------------------------------------  Chemistries  No results for input(s): NA, K, CL, CO2, GLUCOSE, BUN, CREATININE, CALCIUM, MG, AST, ALT, ALKPHOS, BILITOT in the last 168 hours.  Invalid input(s): GFRCGP ------------------------------------------------------------------------------------------------------------------  Cardiac Enzymes No results  for input(s): TROPONINI in the last 168 hours. ------------------------------------------------------------  RADIOLOGY:  No results found.     Thank  you for the consultation and for allowing [facility] to assist in the care of your patient. Our recommendations are noted above.  Please contact us if we can be of further service.   Wells Guiles, MD.  Board Certified in Internal Medicine, Pulmonary Medicine, Critical Care Medicine, and Sleep Medicine.  [facility] Office Number: [phone]  Santiago Glad, M.D.  Billy Fischer, M.D  11/29/2017

## 2017-11-29 NOTE — Patient Instructions (Signed)
No new medications recommended at this time.  Recommend increased physical activity.  Recommend stopping smoking.  Continue albuterol inhaler as needed.

## 2018-10-08 ENCOUNTER — Emergency Department
Admission: EM | Admit: 2018-10-08 | Discharge: 2018-10-08 | Disposition: A | Discharge status: Home / Self Care | Payer: Medicare Other | Attending: Emergency Medicine | Admitting: Emergency Medicine

## 2018-10-08 ENCOUNTER — Other Ambulatory Visit: Payer: Self-pay

## 2018-10-08 DIAGNOSIS — Z79899 Other long term (current) drug therapy: Secondary | ICD-10-CM | POA: Insufficient documentation

## 2018-10-08 DIAGNOSIS — F17210 Nicotine dependence, cigarettes, uncomplicated: Secondary | ICD-10-CM | POA: Diagnosis not present

## 2018-10-08 DIAGNOSIS — I10 Essential (primary) hypertension: Secondary | ICD-10-CM | POA: Insufficient documentation

## 2018-10-08 DIAGNOSIS — Z8673 Personal history of transient ischemic attack (TIA), and cerebral infarction without residual deficits: Secondary | ICD-10-CM | POA: Diagnosis not present

## 2018-10-08 DIAGNOSIS — J449 Chronic obstructive pulmonary disease, unspecified: Secondary | ICD-10-CM | POA: Diagnosis not present

## 2018-10-08 DIAGNOSIS — E119 Type 2 diabetes mellitus without complications: Secondary | ICD-10-CM | POA: Insufficient documentation

## 2018-10-08 DIAGNOSIS — Z7984 Long term (current) use of oral hypoglycemic drugs: Secondary | ICD-10-CM | POA: Insufficient documentation

## 2018-10-08 NOTE — ED Notes (Signed)
Patient's group home notified of patient's discharge. States they are sending someone to pick him up.

## 2018-10-08 NOTE — ED Provider Notes (Signed)
[facility] Emergency Department Provider Note   ____________________________________________   I have reviewed the triage vital signs and the nursing notes.   HISTORY  Chief Complaint Hypertension   History limited by: Not Limited   HPI Paul Hernandez is a 52 y.o. male who presents to the emergency department today because of concerns for high blood pressure which was found at [facility].  The patient states that he had a ham sandwich today so is not surprised that his blood pressure was elevated.  He however denies any symptoms.  Denies any chest pain, headache.  Denies any recent illness.   Per medical record review patient has a history of DM, HTN.   Past Medical History:  Diagnosis Date  . Diabetes mellitus without complication (HCC)   . Hypertension   . Mental health disorder     Patient Active Problem List   Diagnosis Date Noted  . Hyperlipidemia, unspecified 11/29/2017  . Hypertension 11/29/2017  . Schizophrenia (HCC) 11/16/2017  . COPD (chronic obstructive pulmonary disease) (HCC) 11/15/2017  . Mental health disorder 11/15/2017  . Cerebral ischemia 11/15/2017  . Cerebral infarct (HCC) 11/15/2017  . Benign essential HTN 11/13/2017  . Diabetes type 2, controlled (HCC) 11/13/2017  . COPD exacerbation (HCC) 09/16/2017    Past Surgical History:  Procedure Laterality Date  . NO PAST SURGERIES      Prior to Admission medications   Medication Sig Start Date End Date Taking? Authorizing Provider  acetaminophen (TYLENOL) 325 MG tablet Take 650 mg by mouth every 6 (six) hours as needed.    [provider]  albuterol (PROVENTIL HFA;VENTOLIN HFA) 108 (90 Base) MCG/ACT inhaler Inhale 2 puffs into the lungs every 6 (six) hours as needed for wheezing or shortness of breath. 08/13/17   Tommi Rumps, PA-C  albuterol (PROVENTIL) (2.5 MG/3ML) 0.083% nebulizer solution Take 2.5 mg by nebulization 4 (four) times daily.    [provider]   amLODipine-benazepril (LOTREL) 10-20 MG capsule Take 1 capsule by mouth every evening.     [provider]  aspirin EC 325 MG tablet Take 1 tablet (325 mg total) by mouth daily. 11/16/17   Enedina Finner, MD  atorvastatin (LIPITOR) 40 MG tablet Take 40 mg by mouth at bedtime.     [provider]  benazepril (LOTENSIN) 20 MG tablet Take 1 tablet (20 mg total) by mouth daily. 11/16/17   Enedina Finner, MD  benztropine (COGENTIN) 2 MG tablet Take 2 mg by mouth at bedtime.    [provider]  carvedilol (COREG) 25 MG tablet Take 1 tablet (25 mg total) by mouth 2 (two) times daily with a meal. 11/16/17   Enedina Finner, MD  Cholecalciferol (VITAMIN D3) 5000 units CAPS Take 5,000 Units by mouth daily.    [provider]  cloNIDine (CATAPRES) 0.3 MG tablet Take 0.3 mg by mouth every 8 (eight) hours.     [provider]  dextrose (GLUTOSE) 40 % GEL Take by mouth once as needed for low blood sugar. For blood sugar <70    [provider]  diphenhydrAMINE (BENADRYL) 25 mg capsule Take 25 mg by mouth at bedtime.     [provider]  haloperidol decanoate (HALDOL DECANOATE) 100 MG/ML injection Inject 90 mg into the muscle every 28 (twenty-eight) days.     [provider]  metFORMIN (GLUCOPHAGE) 500 MG tablet Take 1,000-1,500 mg by mouth daily with breakfast. Take 1500 mg in the morning and 1,000 mg at bedtime.  [provider]  metoprolol (TOPROL-XL) 200 MG 24 hr tablet Take 400 mg by mouth daily. 11/10/17   [provider]  triamterene-hydrochlorothiazide (MAXZIDE) 75-50 MG tablet Take 1 tablet by mouth every morning.     [provider]    Allergies Asa [aspirin]  Family History  Problem Relation Age of Onset  . CVA Mother   . CAD Father     Social History Social History   Tobacco Use  . Smoking status: Current Some Day Smoker    Packs/day: 0.50  . Smokeless tobacco: Never Used  Substance Use Topics  .  Alcohol use: No  . Drug use: No    Review of Systems Constitutional: No fever/chills Eyes: No visual changes. ENT: No sore throat. Cardiovascular: Denies chest pain. Respiratory: Denies shortness of breath. Gastrointestinal: No abdominal pain.  No nausea, no vomiting.  No diarrhea.   Genitourinary: Negative for dysuria. Musculoskeletal: Negative for back pain. Skin: Negative for rash. Neurological: Negative for headaches, focal weakness or numbness.  ____________________________________________   PHYSICAL EXAM:  VITAL SIGNS: ED Triage Vitals  Enc Vitals Group     BP 10/08/18 1348 (!) 151/107     Pulse Rate 10/08/18 1348 (!) 109     Resp 10/08/18 1348 18     Temp 10/08/18 1348 98.3 F (36.8 C)     Temp Source 10/08/18 1348 Oral     SpO2 10/08/18 1348 96 %     Weight 10/08/18 1349 235 lb (106.6 kg)     Height 10/08/18 1349 6' (1.829 m)     Head Circumference --      Peak Flow --      Pain Score 10/08/18 1349 0   Constitutional: Alert and oriented.  Eyes: Conjunctivae are normal.  ENT      Head: Normocephalic and atraumatic.      Nose: No congestion/rhinnorhea.      Mouth/Throat: Mucous membranes are moist.      Neck: No stridor. Cardiovascular: Normal rate, regular rhythm.  No murmurs, rubs, or gallops.  Respiratory: Normal respiratory effort without tachypnea nor retractions. Breath sounds are clear and equal bilaterally. No wheezes/rales/rhonchi. Gastrointestinal: Soft and non tender. No rebound. No guarding.  Genitourinary: Deferred Musculoskeletal: Normal range of motion in all extremities. No lower extremity edema. Neurologic:  Normal speech and language. No gross focal neurologic deficits are appreciated.  Skin:  Skin is warm, dry and intact. No rash noted. Psychiatric: Mood and affect are normal. Speech and behavior are normal. Patient exhibits appropriate insight and judgment.  ____________________________________________    LABS (pertinent  positives/negatives)  None  ____________________________________________   EKG  None  ____________________________________________    RADIOLOGY  None  ____________________________________________   PROCEDURES  Procedures  ____________________________________________   INITIAL IMPRESSION / ASSESSMENT AND PLAN / ED COURSE  Pertinent labs & imaging results that were available during my care of the patient were reviewed by me and considered in my medical decision making (see chart for details).   [facility].  Patient's blood pressure is better here [facility].  Given the patient is asymptomatic I do feel it is reasonable for patient to follow-up with primary care. ____________________________________________   FINAL CLINICAL IMPRESSION(S) / ED DIAGNOSES  Final diagnoses:  Hypertension, unspecified type     Note: This dictation was prepared with Dragon dictation. Any transcriptional errors that result from this process are unintentional     Phineas SemenGoodman, Omunique Pederson, MD 10/08/18 [phone]

## 2018-10-08 NOTE — ED Triage Notes (Signed)
Pt arrived via ems with hypertension. Pt does take hypertension medications, and he took them this morning.

## 2018-10-08 NOTE — ED Notes (Signed)
Patient reports high blood pressure at home prior to arrival. Patient reports he ate ham this morning which is why is pressure was high. Reports compliance with medication. Denies CP or SOB.

## 2018-10-08 NOTE — Discharge Instructions (Addendum)
Please seek medical attention for any high fevers, chest pain, shortness of breath, change in behavior, persistent vomiting, bloody stool or any other new or concerning symptoms.  

## 2018-11-20 IMAGING — CT CT HEAD W/O CM
3 series · 15 of 47 positions shown, 18 images · non-contrast
Comparison: None.

CLINICAL DATA: Altered mental status for a few hours with altered
speech

EXAM:
CT HEAD WITHOUT CONTRAST
TECHNIQUE: Contiguous axial images were obtained from the base of the skull
through the vertex without intravenous contrast.

[Series 3: head wo · axial · 0.44mm/px · z∈[+437,+562]mm · 9 of 30 slices shown, 12 images]
[im 3/30  brain]
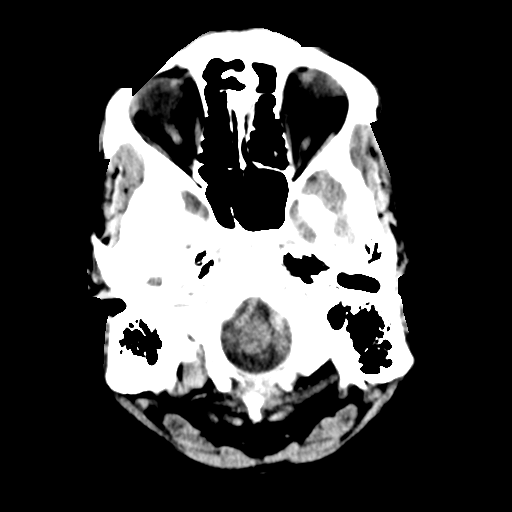
[im 3/30  bone]
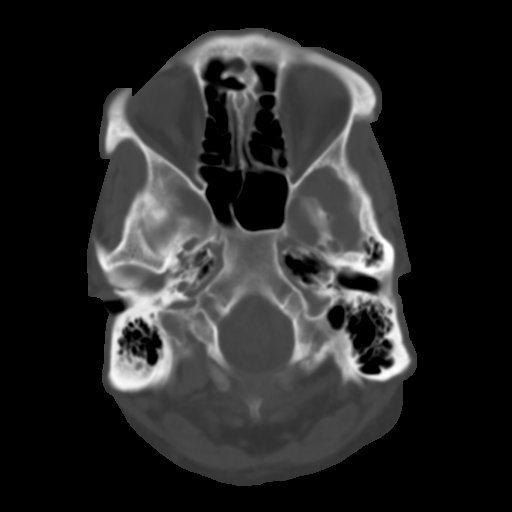
[im 6/30  brain]
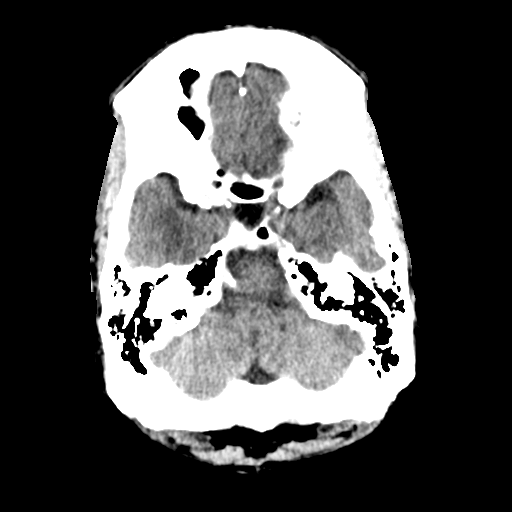
[im 9/30  brain]
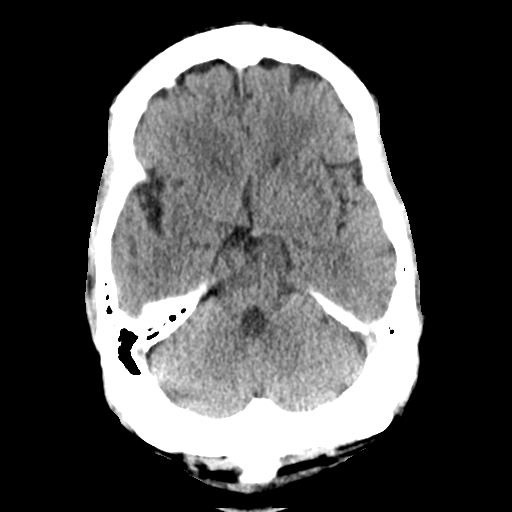
[im 12/30  brain]
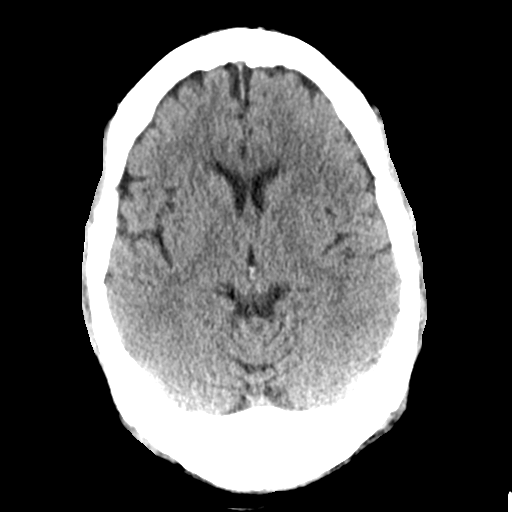
[im 16/30  brain]
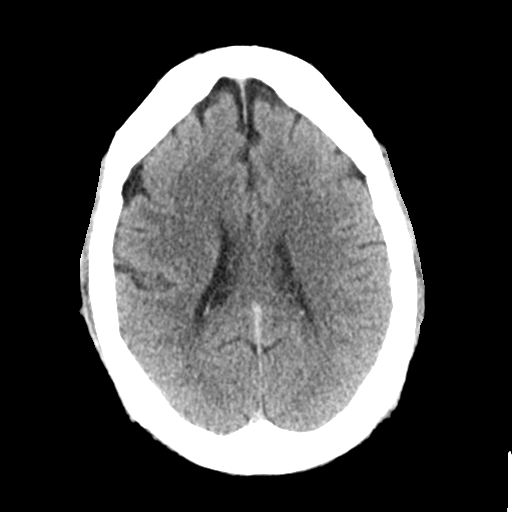
[im 16/30  bone]
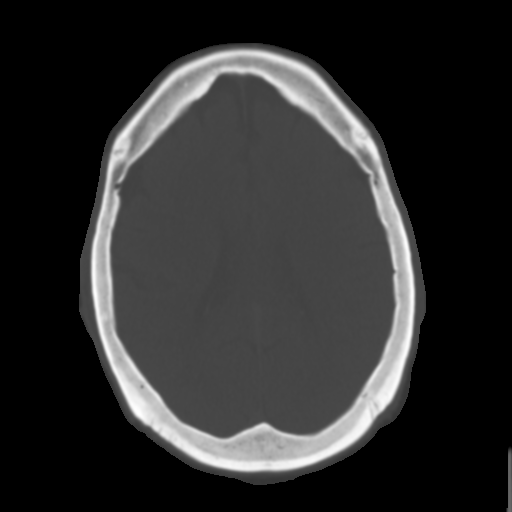
[im 19/30  brain]
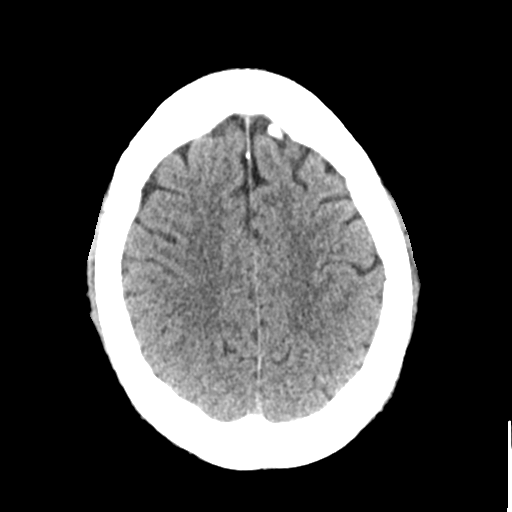
[im 22/30  brain]
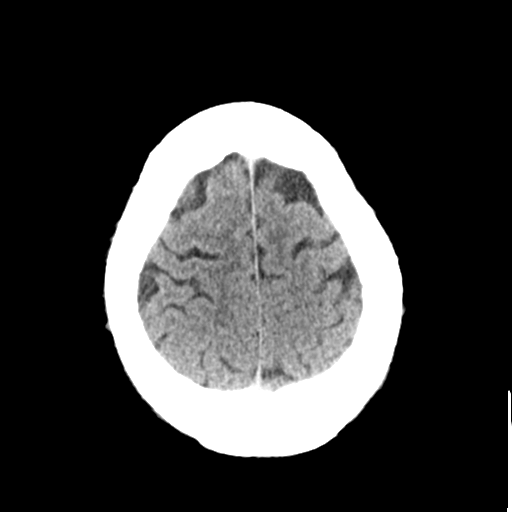
[im 25/30  brain]
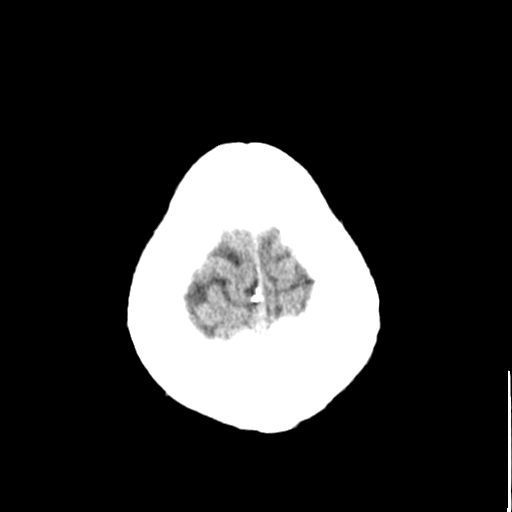
[im 28/30  brain]
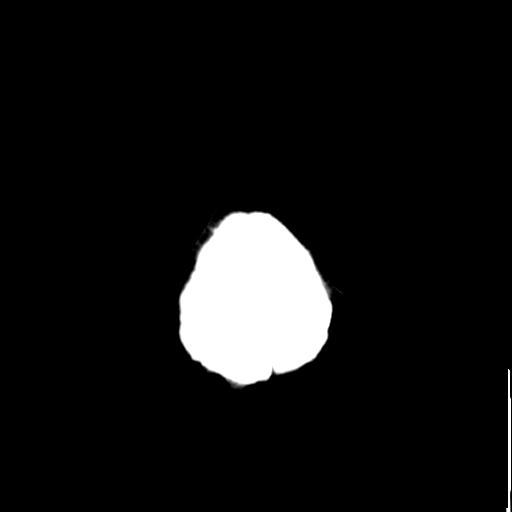
[im 28/30  bone]
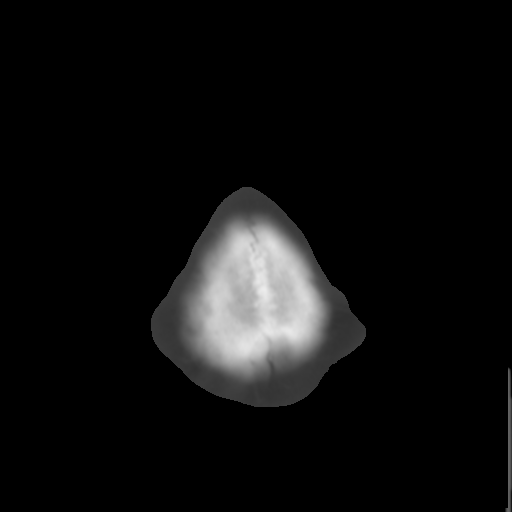

[Series 4: coronal soft tissue · coronal · 0.33mm/px · 3 of 64 slices shown]
[im 22/64  brain]
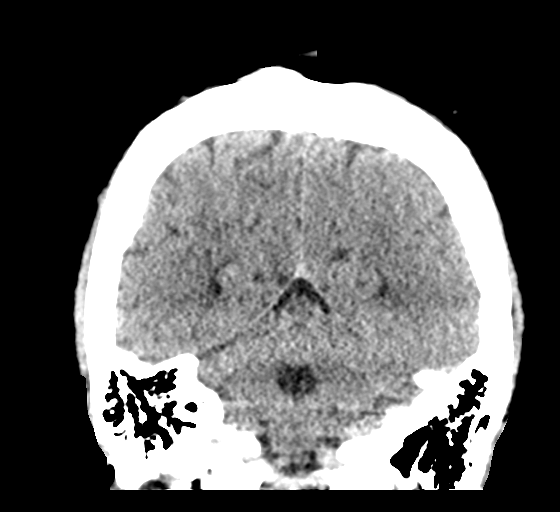
[im 29/64  brain]
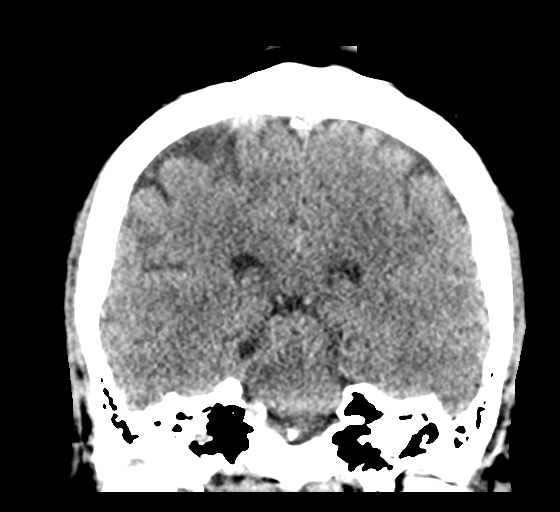
[im 36/64  brain]
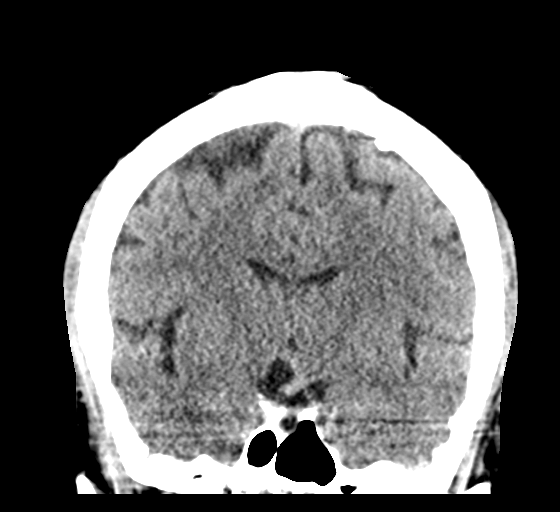

[Series 5: sagittal soft tissue · sagittal · 0.29mm/px · 3 of 53 slices shown]
[im 18/53  brain]
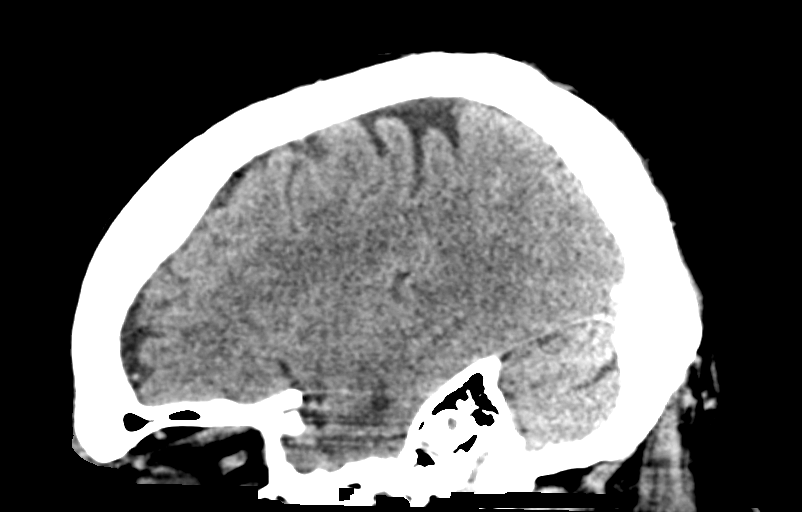
[im 27/53  brain]
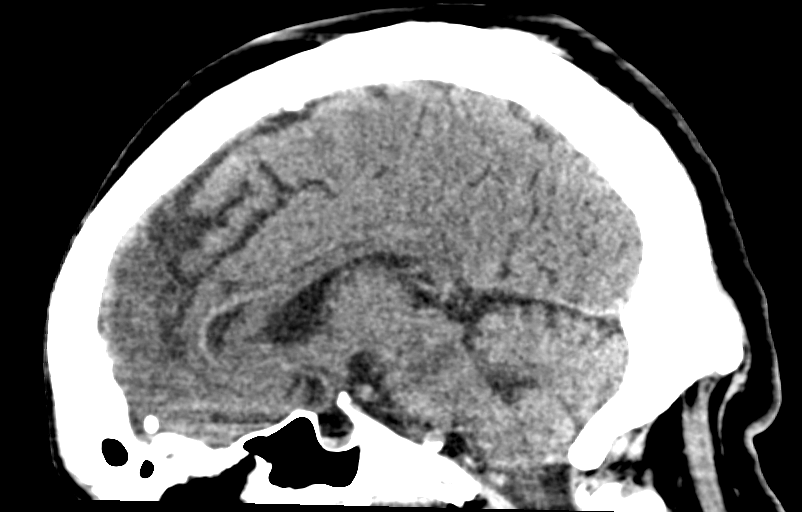
[im 35/53  brain]
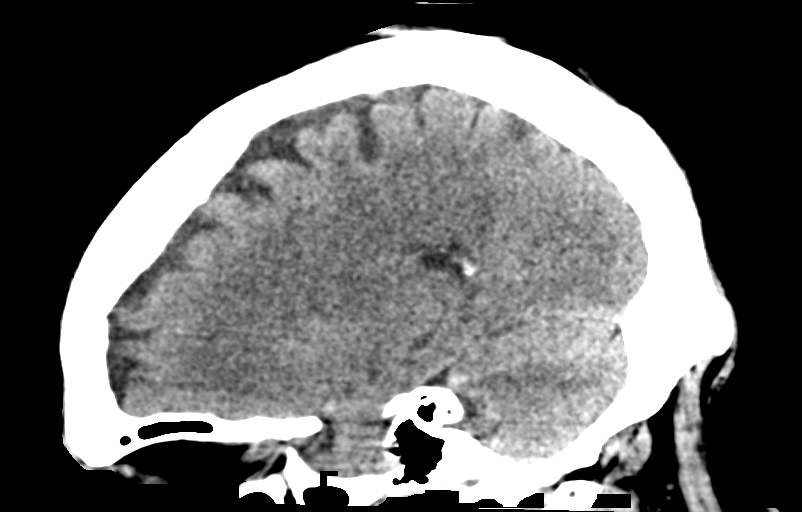

[15 of 47 positions shown; findings below may reference images not displayed]

FINDINGS: Brain: No evidence of acute infarction, hemorrhage, hydrocephalus,
extra-axial collection or mass lesion/mass effect.

Vascular: No hyperdense vessel or unexpected calcification.

Skull: Normal. Negative for fracture or focal lesion.

Sinuses/Orbits: No acute finding.

Other: None.
IMPRESSION: No acute abnormality noted.

## 2018-11-29 ENCOUNTER — Inpatient Hospital Stay
Admission: EM | Admit: 2018-11-29 | Discharge: 2018-12-04 | DRG: 208 | Disposition: A | Discharge status: Home / Self Care | Payer: Medicare Other | Attending: Internal Medicine | Admitting: Internal Medicine

## 2018-11-29 ENCOUNTER — Inpatient Hospital Stay: Payer: Medicare Other

## 2018-11-29 ENCOUNTER — Other Ambulatory Visit: Payer: Self-pay

## 2018-11-29 ENCOUNTER — Emergency Department: Payer: Medicare Other

## 2018-11-29 DIAGNOSIS — F259 Schizoaffective disorder, unspecified: Secondary | ICD-10-CM | POA: Diagnosis present

## 2018-11-29 DIAGNOSIS — F17200 Nicotine dependence, unspecified, uncomplicated: Secondary | ICD-10-CM | POA: Diagnosis present

## 2018-11-29 DIAGNOSIS — N189 Chronic kidney disease, unspecified: Secondary | ICD-10-CM | POA: Diagnosis present

## 2018-11-29 DIAGNOSIS — R0602 Shortness of breath: Secondary | ICD-10-CM

## 2018-11-29 DIAGNOSIS — I130 Hypertensive heart and chronic kidney disease with heart failure and stage 1 through stage 4 chronic kidney disease, or unspecified chronic kidney disease: Secondary | ICD-10-CM | POA: Diagnosis present

## 2018-11-29 DIAGNOSIS — Z886 Allergy status to analgesic agent status: Secondary | ICD-10-CM

## 2018-11-29 DIAGNOSIS — E785 Hyperlipidemia, unspecified: Secondary | ICD-10-CM | POA: Diagnosis present

## 2018-11-29 DIAGNOSIS — F209 Schizophrenia, unspecified: Secondary | ICD-10-CM | POA: Diagnosis present

## 2018-11-29 DIAGNOSIS — J101 Influenza due to other identified influenza virus with other respiratory manifestations: Secondary | ICD-10-CM | POA: Diagnosis present

## 2018-11-29 DIAGNOSIS — F203 Undifferentiated schizophrenia: Secondary | ICD-10-CM | POA: Diagnosis not present

## 2018-11-29 DIAGNOSIS — Z7951 Long term (current) use of inhaled steroids: Secondary | ICD-10-CM

## 2018-11-29 DIAGNOSIS — Z978 Presence of other specified devices: Secondary | ICD-10-CM

## 2018-11-29 DIAGNOSIS — J440 Chronic obstructive pulmonary disease with acute lower respiratory infection: Secondary | ICD-10-CM | POA: Diagnosis present

## 2018-11-29 DIAGNOSIS — N170 Acute kidney failure with tubular necrosis: Secondary | ICD-10-CM | POA: Diagnosis present

## 2018-11-29 DIAGNOSIS — E1122 Type 2 diabetes mellitus with diabetic chronic kidney disease: Secondary | ICD-10-CM | POA: Diagnosis present

## 2018-11-29 DIAGNOSIS — Z7984 Long term (current) use of oral hypoglycemic drugs: Secondary | ICD-10-CM

## 2018-11-29 DIAGNOSIS — Z8249 Family history of ischemic heart disease and other diseases of the circulatory system: Secondary | ICD-10-CM | POA: Diagnosis not present

## 2018-11-29 DIAGNOSIS — I248 Other forms of acute ischemic heart disease: Secondary | ICD-10-CM | POA: Diagnosis present

## 2018-11-29 DIAGNOSIS — Z823 Family history of stroke: Secondary | ICD-10-CM

## 2018-11-29 DIAGNOSIS — J9602 Acute respiratory failure with hypercapnia: Secondary | ICD-10-CM | POA: Diagnosis present

## 2018-11-29 DIAGNOSIS — J9601 Acute respiratory failure with hypoxia: Secondary | ICD-10-CM | POA: Diagnosis present

## 2018-11-29 DIAGNOSIS — R7989 Other specified abnormal findings of blood chemistry: Secondary | ICD-10-CM

## 2018-11-29 DIAGNOSIS — J441 Chronic obstructive pulmonary disease with (acute) exacerbation: Secondary | ICD-10-CM | POA: Diagnosis present

## 2018-11-29 DIAGNOSIS — I429 Cardiomyopathy, unspecified: Secondary | ICD-10-CM | POA: Diagnosis present

## 2018-11-29 DIAGNOSIS — I5020 Unspecified systolic (congestive) heart failure: Secondary | ICD-10-CM

## 2018-11-29 DIAGNOSIS — I5022 Chronic systolic (congestive) heart failure: Secondary | ICD-10-CM | POA: Diagnosis present

## 2018-11-29 DIAGNOSIS — E119 Type 2 diabetes mellitus without complications: Secondary | ICD-10-CM | POA: Diagnosis present

## 2018-11-29 DIAGNOSIS — R778 Other specified abnormalities of plasma proteins: Secondary | ICD-10-CM

## 2018-11-29 DIAGNOSIS — Z452 Encounter for adjustment and management of vascular access device: Secondary | ICD-10-CM

## 2018-11-29 DIAGNOSIS — I472 Ventricular tachycardia: Secondary | ICD-10-CM | POA: Diagnosis present

## 2018-11-29 DIAGNOSIS — Z6829 Body mass index (BMI) 29.0-29.9, adult: Secondary | ICD-10-CM

## 2018-11-29 DIAGNOSIS — I959 Hypotension, unspecified: Secondary | ICD-10-CM | POA: Diagnosis present

## 2018-11-29 DIAGNOSIS — Z79899 Other long term (current) drug therapy: Secondary | ICD-10-CM

## 2018-11-29 DIAGNOSIS — Z4659 Encounter for fitting and adjustment of other gastrointestinal appliance and device: Secondary | ICD-10-CM

## 2018-11-29 HISTORY — DX: Heart failure, unspecified: I50.9

## 2018-11-29 HISTORY — DX: Chronic obstructive pulmonary disease, unspecified: J44.9

## 2018-11-29 LAB — HEMOGLOBIN A1C
Hgb A1c MFr Bld: 8.3 % — ABNORMAL HIGH (ref 4.8–5.6)
Mean Plasma Glucose: 191.51 mg/dL

## 2018-11-29 LAB — LIPID PANEL
Cholesterol: 210 mg/dL — ABNORMAL HIGH (ref 0–200)
HDL: 47 mg/dL (ref >40)
LDL Cholesterol: 149 mg/dL — ABNORMAL HIGH (ref 0–99)
Total CHOL/HDL Ratio: 4.5 RATIO
Triglycerides: 68 mg/dL (ref <150)
VLDL: 14 mg/dL (ref 0–40)

## 2018-11-29 LAB — CBC WITH DIFFERENTIAL/PLATELET
Abs Immature Granulocytes: 0.13 10*3/uL — ABNORMAL HIGH (ref 0.00–0.07)
BASOS ABS: 0 10*3/uL (ref 0.0–0.1)
Basophils Relative: 0 %
EOS PCT: 0 %
Eosinophils Absolute: 0 10*3/uL (ref 0.0–0.5)
HEMATOCRIT: 46.5 % (ref 39.0–52.0)
HEMOGLOBIN: 14.7 g/dL (ref 13.0–17.0)
Immature Granulocytes: 1 %
LYMPHS ABS: 0.2 10*3/uL — AB (ref 0.7–4.0)
LYMPHS PCT: 2 %
MCH: 29 pg (ref 26.0–34.0)
MCHC: 31.6 g/dL (ref 30.0–36.0)
MCV: 91.7 fL (ref 80.0–100.0)
Monocytes Absolute: 0.5 10*3/uL (ref 0.1–1.0)
Monocytes Relative: 5 %
NEUTROS ABS: 8.7 10*3/uL — AB (ref 1.7–7.7)
NRBC: 0 % (ref 0.0–0.2)
Neutrophils Relative %: 92 %
Platelets: 308 10*3/uL (ref 150–400)
RBC: 5.07 MIL/uL (ref 4.22–5.81)
RDW: 13.9 % (ref 11.5–15.5)
WBC: 9.6 10*3/uL (ref 4.0–10.5)

## 2018-11-29 LAB — CBC
HCT: 44.2 % (ref 39.0–52.0)
HCT: 43.8 % (ref 39.0–52.0)
Hemoglobin: 13.7 g/dL (ref 13.0–17.0)
Hemoglobin: 13.3 g/dL (ref 13.0–17.0)
MCH: 29.3 pg (ref 26.0–34.0)
MCH: 29.4 pg (ref 26.0–34.0)
MCHC: 31 g/dL (ref 30.0–36.0)
MCHC: 30.4 g/dL (ref 30.0–36.0)
MCV: 94.6 fL (ref 80.0–100.0)
MCV: 96.7 fL (ref 80.0–100.0)
Platelets: 246 10*3/uL (ref 150–400)
Platelets: 269 10*3/uL (ref 150–400)
RBC: 4.67 MIL/uL (ref 4.22–5.81)
RBC: 4.53 MIL/uL (ref 4.22–5.81)
RDW: 14 % (ref 11.5–15.5)
RDW: 14.2 % (ref 11.5–15.5)
WBC: 7.6 10*3/uL (ref 4.0–10.5)
WBC: 6 10*3/uL (ref 4.0–10.5)
nRBC: 0 % (ref 0.0–0.2)
nRBC: 0 % (ref 0.0–0.2)

## 2018-11-29 LAB — CREATININE, SERUM
Creatinine, Ser: 1.59 mg/dL — ABNORMAL HIGH (ref 0.61–1.24)
GFR calc Af Amer: 57 mL/min — ABNORMAL LOW (ref >60)
GFR calc non Af Amer: 50 mL/min — ABNORMAL LOW (ref >60)

## 2018-11-29 LAB — BASIC METABOLIC PANEL
Anion gap: 9 (ref 5–15)
BUN: 25 mg/dL — ABNORMAL HIGH (ref 6–20)
CHLORIDE: 101 mmol/L (ref 98–111)
CO2: 27 mmol/L (ref 22–32)
CREATININE: 1.44 mg/dL — AB (ref 0.61–1.24)
Calcium: 8.1 mg/dL — ABNORMAL LOW (ref 8.9–10.3)
GFR calc non Af Amer: 56 mL/min — ABNORMAL LOW (ref >60)
Glucose, Bld: 249 mg/dL — ABNORMAL HIGH (ref 70–99)
Potassium: 4.4 mmol/L (ref 3.5–5.1)
Sodium: 137 mmol/L (ref 135–145)

## 2018-11-29 LAB — BLOOD GAS, ARTERIAL
Acid-Base Excess: 9.2 mmol/L — ABNORMAL HIGH (ref 0.0–2.0)
Bicarbonate: 37 mmol/L — ABNORMAL HIGH (ref 20.0–28.0)
FIO2: 100
MECHVT: 500 mL
Mechanical Rate: 20
O2 Saturation: 99.9 %
PEEP: 5 cmH2O
Patient temperature: 37
pCO2 arterial: 64 mmHg — ABNORMAL HIGH (ref 32.0–48.0)
pH, Arterial: 7.37 (ref 7.350–7.450)
pO2, Arterial: 363 mmHg — ABNORMAL HIGH (ref 83.0–108.0)

## 2018-11-29 LAB — COMPREHENSIVE METABOLIC PANEL
ALT: 154 U/L — ABNORMAL HIGH (ref 0–44)
AST: 222 U/L — AB (ref 15–41)
Albumin: 3.1 g/dL — ABNORMAL LOW (ref 3.5–5.0)
Alkaline Phosphatase: 49 U/L (ref 38–126)
Anion gap: 6 (ref 5–15)
BUN: 37 mg/dL — ABNORMAL HIGH (ref 6–20)
CO2: 32 mmol/L (ref 22–32)
Calcium: 7.7 mg/dL — ABNORMAL LOW (ref 8.9–10.3)
Chloride: 104 mmol/L (ref 98–111)
Creatinine, Ser: 2.11 mg/dL — ABNORMAL HIGH (ref 0.61–1.24)
GFR calc Af Amer: 41 mL/min — ABNORMAL LOW (ref >60)
GFR, EST NON AFRICAN AMERICAN: 35 mL/min — AB (ref >60)
Glucose, Bld: 105 mg/dL — ABNORMAL HIGH (ref 70–99)
Potassium: 5.9 mmol/L — ABNORMAL HIGH (ref 3.5–5.1)
Sodium: 142 mmol/L (ref 135–145)
Total Bilirubin: 0.9 mg/dL (ref 0.3–1.2)
Total Protein: 5.4 g/dL — ABNORMAL LOW (ref 6.5–8.1)

## 2018-11-29 LAB — BLOOD GAS, VENOUS
Acid-base deficit: 1.5 mmol/L (ref 0.0–2.0)
Bicarbonate: 27.8 mmol/L (ref 20.0–28.0)
DELIVERY SYSTEMS: POSITIVE
FIO2: 0.5
O2 Saturation: 97.3 %
PATIENT TEMPERATURE: 37
PO2 VEN: 110 mmHg — AB (ref 32.0–45.0)
pCO2, Ven: 68 mmHg — ABNORMAL HIGH (ref 44.0–60.0)
pH, Ven: 7.22 — ABNORMAL LOW (ref 7.250–7.430)

## 2018-11-29 LAB — INFLUENZA PANEL BY PCR (TYPE A & B)
Influenza A By PCR: POSITIVE — AB
Influenza B By PCR: NEGATIVE

## 2018-11-29 LAB — PROTIME-INR
INR: 1.25
INR: 1.3
PROTHROMBIN TIME: 15.6 s — AB (ref 11.4–15.2)
Prothrombin Time: 16.1 seconds — ABNORMAL HIGH (ref 11.4–15.2)

## 2018-11-29 LAB — PHOSPHORUS: Phosphorus: 9.2 mg/dL — ABNORMAL HIGH (ref 2.5–4.6)

## 2018-11-29 LAB — MAGNESIUM: Magnesium: 2.5 mg/dL — ABNORMAL HIGH (ref 1.7–2.4)

## 2018-11-29 LAB — GLUCOSE, CAPILLARY
GLUCOSE-CAPILLARY: 363 mg/dL — AB (ref 70–99)
GLUCOSE-CAPILLARY: 103 mg/dL — AB (ref 70–99)
Glucose-Capillary: 178 mg/dL — ABNORMAL HIGH (ref 70–99)

## 2018-11-29 LAB — TROPONIN I
TROPONIN I: 0.6 ng/mL — AB (ref <0.03)
TROPONIN I: 0.74 ng/mL — AB (ref <0.03)
Troponin I: 1.42 ng/mL (ref <0.03)
Troponin I: 1.62 ng/mL (ref <0.03)

## 2018-11-29 LAB — BRAIN NATRIURETIC PEPTIDE: B Natriuretic Peptide: 3045 pg/mL — ABNORMAL HIGH (ref 0.0–100.0)

## 2018-11-29 LAB — LACTIC ACID, PLASMA: Lactic Acid, Venous: 2 mmol/L (ref 0.5–1.9)

## 2018-11-29 LAB — TRIGLYCERIDES: Triglycerides: 37 mg/dL (ref <150)

## 2018-11-29 LAB — MRSA PCR SCREENING: MRSA by PCR: NEGATIVE

## 2018-11-29 LAB — HEPARIN LEVEL (UNFRACTIONATED): Heparin Unfractionated: 0.57 IU/mL (ref 0.30–0.70)

## 2018-11-29 LAB — APTT: aPTT: 30 seconds (ref 24–36)

## 2018-11-29 MED ORDER — FENTANYL 2500MCG IN NS 250ML (10MCG/ML) PREMIX INFUSION
0.0000 ug/h | INTRAVENOUS | Status: DC
  Administered 2018-11-29: 50 ug/h via INTRAVENOUS

## 2018-11-29 MED ORDER — HEPARIN SODIUM (PORCINE) 5000 UNIT/ML IJ SOLN
5000.0000 [IU] | Freq: Three times a day (TID) | INTRAMUSCULAR | Status: DC
  Filled 2018-11-29: qty 1

## 2018-11-29 MED ORDER — HEPARIN BOLUS VIA INFUSION
4000.0000 [IU] | Freq: Once | INTRAVENOUS | Status: AC
  Administered 2018-11-29: 4000 [IU] via INTRAVENOUS
  Filled 2018-11-29: qty 4000

## 2018-11-29 MED ORDER — ORAL CARE MOUTH RINSE: 15.0000 mL | Freq: Two times a day (BID) | OROMUCOSAL | Status: DC

## 2018-11-29 MED ORDER — FENTANYL 2500MCG IN NS 250ML (10MCG/ML) PREMIX INFUSION
INTRAVENOUS | Status: AC
  Administered 2018-11-29: 50 ug/h via INTRAVENOUS
  Filled 2018-11-29: qty 250

## 2018-11-29 MED ORDER — TIOTROPIUM BROMIDE MONOHYDRATE 18 MCG IN CAPS
18.0000 ug | ORAL_CAPSULE | Freq: Every day | RESPIRATORY_TRACT | Status: DC
  Administered 2018-11-29 – 2018-12-02 (×3): 18 ug via RESPIRATORY_TRACT
  Filled 2018-11-29: qty 5

## 2018-11-29 MED ORDER — CALCIUM GLUCONATE-NACL 1-0.675 GM/50ML-% IV SOLN
1.0000 g | Freq: Once | INTRAVENOUS | Status: AC
  Administered 2018-11-29: 1000 mg via INTRAVENOUS
  Filled 2018-11-29: qty 50

## 2018-11-29 MED ORDER — METHYLPREDNISOLONE SODIUM SUCC 125 MG IJ SOLR
125.0000 mg | Freq: Once | INTRAMUSCULAR | Status: AC
  Administered 2018-11-29: 125 mg via INTRAVENOUS
  Filled 2018-11-29: qty 2

## 2018-11-29 MED ORDER — DOCUSATE SODIUM 100 MG PO CAPS: 100.0000 mg | ORAL_CAPSULE | Freq: Two times a day (BID) | ORAL | Status: DC | PRN

## 2018-11-29 MED ORDER — AZILSARTAN-CHLORTHALIDONE 40-12.5 MG PO TABS: 1.0000 | ORAL_TABLET | Freq: Every day | ORAL | Status: DC

## 2018-11-29 MED ORDER — NOREPINEPHRINE 4 MG/250ML-% IV SOLN
0.0000 ug/min | INTRAVENOUS | Status: DC
  Administered 2018-11-29: 10 ug/min via INTRAVENOUS

## 2018-11-29 MED ORDER — PANTOPRAZOLE SODIUM 40 MG IV SOLR
40.0000 mg | Freq: Every day | INTRAVENOUS | Status: DC
  Administered 2018-11-29 – 2018-12-03 (×5): 40 mg via INTRAVENOUS
  Filled 2018-11-29 (×5): qty 40

## 2018-11-29 MED ORDER — CHLORHEXIDINE GLUCONATE 0.12 % MT SOLN: 15.0000 mL | Freq: Two times a day (BID) | OROMUCOSAL | Status: DC

## 2018-11-29 MED ORDER — DEXTROSE 50 % IV SOLN
50.0000 mL | Freq: Once | INTRAVENOUS | Status: AC
  Administered 2018-11-29: 50 mL via INTRAVENOUS
  Filled 2018-11-29: qty 50

## 2018-11-29 MED ORDER — SODIUM CHLORIDE 0.9 % IV SOLN
INTRAVENOUS | Status: DC | PRN
  Administered 2018-11-29: 250 mL via INTRAVENOUS

## 2018-11-29 MED ORDER — IPRATROPIUM-ALBUTEROL 0.5-2.5 (3) MG/3ML IN SOLN
RESPIRATORY_TRACT | Status: AC
  Administered 2018-11-29: 3 mL
  Filled 2018-11-29: qty 3

## 2018-11-29 MED ORDER — CHLORHEXIDINE GLUCONATE 0.12% ORAL RINSE (MEDLINE KIT)
15.0000 mL | Freq: Two times a day (BID) | OROMUCOSAL | Status: DC
  Administered 2018-11-29 – 2018-11-30 (×2): 15 mL via OROMUCOSAL

## 2018-11-29 MED ORDER — LOSARTAN POTASSIUM 25 MG PO TABS
50.0000 mg | ORAL_TABLET | Freq: Every day | ORAL | Status: DC
  Administered 2018-11-29 – 2018-12-04 (×3): 50 mg via ORAL
  Filled 2018-11-29 (×2): qty 2
  Filled 2018-11-29: qty 1
  Filled 2018-11-29: qty 2

## 2018-11-29 MED ORDER — DOPAMINE-DEXTROSE 3.2-5 MG/ML-% IV SOLN
0.0000 ug/kg/min | INTRAVENOUS | Status: DC
  Administered 2018-11-29: 5 ug/kg/min via INTRAVENOUS

## 2018-11-29 MED ORDER — ASPIRIN EC 325 MG PO TBEC
325.0000 mg | DELAYED_RELEASE_TABLET | Freq: Every day | ORAL | Status: DC
  Administered 2018-11-30 – 2018-12-04 (×3): 325 mg via ORAL
  Filled 2018-11-29 (×4): qty 1

## 2018-11-29 MED ORDER — SODIUM CHLORIDE 0.9 % IV BOLUS
1000.0000 mL | Freq: Once | INTRAVENOUS | Status: AC
  Administered 2018-11-29: 1000 mL via INTRAVENOUS

## 2018-11-29 MED ORDER — DOPAMINE-DEXTROSE 3.2-5 MG/ML-% IV SOLN
INTRAVENOUS | Status: AC
  Filled 2018-11-29: qty 250

## 2018-11-29 MED ORDER — SODIUM CHLORIDE 0.9% FLUSH: 10.0000 mL | INTRAVENOUS | Status: DC | PRN

## 2018-11-29 MED ORDER — ISOSORB DINITRATE-HYDRALAZINE 20-37.5 MG PO TABS
1.0000 | ORAL_TABLET | Freq: Three times a day (TID) | ORAL | Status: DC
  Administered 2018-12-02 – 2018-12-04 (×6): 1 via ORAL
  Filled 2018-11-29 (×17): qty 1

## 2018-11-29 MED ORDER — CARVEDILOL 25 MG PO TABS
25.0000 mg | ORAL_TABLET | ORAL | Status: AC
  Administered 2018-11-29: 25 mg via ORAL
  Filled 2018-11-29: qty 1

## 2018-11-29 MED ORDER — METHYLPREDNISOLONE SODIUM SUCC 125 MG IJ SOLR
60.0000 mg | Freq: Four times a day (QID) | INTRAMUSCULAR | Status: DC
  Administered 2018-11-29 – 2018-12-02 (×11): 60 mg via INTRAVENOUS
  Filled 2018-11-29 (×11): qty 2

## 2018-11-29 MED ORDER — INSULIN REGULAR HUMAN 100 UNIT/ML IJ SOLN
10.0000 [IU] | Freq: Once | INTRAMUSCULAR | Status: AC
  Administered 2018-11-29: 10 [IU] via INTRAVENOUS
  Filled 2018-11-29: qty 10

## 2018-11-29 MED ORDER — ETOMIDATE 2 MG/ML IV SOLN
INTRAVENOUS | Status: AC
  Administered 2018-11-29: 30 mg via INTRAVENOUS
  Filled 2018-11-29: qty 10

## 2018-11-29 MED ORDER — CARVEDILOL 25 MG PO TABS
25.0000 mg | ORAL_TABLET | Freq: Two times a day (BID) | ORAL | Status: DC
  Administered 2018-12-02 – 2018-12-04 (×4): 25 mg via ORAL
  Filled 2018-11-29 (×5): qty 1

## 2018-11-29 MED ORDER — IPRATROPIUM-ALBUTEROL 0.5-2.5 (3) MG/3ML IN SOLN
3.0000 mL | RESPIRATORY_TRACT | Status: DC
  Administered 2018-11-29 – 2018-12-03 (×21): 3 mL via RESPIRATORY_TRACT
  Filled 2018-11-29 (×20): qty 3

## 2018-11-29 MED ORDER — HEPARIN (PORCINE) 25000 UT/250ML-% IV SOLN
1000.0000 [IU]/h | INTRAVENOUS | Status: DC
Start: 2018-11-29 — End: 2018-12-01
  Administered 2018-11-29 – 2018-11-30 (×2): 1200 [IU]/h via INTRAVENOUS
  Administered 2018-12-01: 1000 [IU]/h via INTRAVENOUS
  Administered 2018-12-01: 1100 [IU]/h via INTRAVENOUS
  Filled 2018-11-29 (×2): qty 250

## 2018-11-29 MED ORDER — ETOMIDATE 2 MG/ML IV SOLN
30.0000 mg | Freq: Once | INTRAVENOUS | Status: AC
  Administered 2018-11-29: 30 mg via INTRAVENOUS

## 2018-11-29 MED ORDER — SODIUM CHLORIDE 0.9% FLUSH
10.0000 mL | Freq: Two times a day (BID) | INTRAVENOUS | Status: DC
  Administered 2018-11-30 – 2018-12-01 (×4): 10 mL
  Administered 2018-12-02: 30 mL
  Administered 2018-12-02 – 2018-12-03 (×2): 10 mL
  Administered 2018-12-03: 30 mL
  Administered 2018-12-04: 10 mL

## 2018-11-29 MED ORDER — FLUTICASONE PROPIONATE 50 MCG/ACT NA SUSP
1.0000 | Freq: Every day | NASAL | Status: DC
  Administered 2018-11-29 – 2018-12-02 (×3): 1 via NASAL
  Filled 2018-11-29: qty 16

## 2018-11-29 MED ORDER — MIDAZOLAM HCL 2 MG/2ML IJ SOLN: 2.0000 mg | INTRAMUSCULAR | Status: DC | PRN

## 2018-11-29 MED ORDER — SENNOSIDES 8.8 MG/5ML PO SYRP
5.0000 mL | ORAL_SOLUTION | Freq: Two times a day (BID) | ORAL | Status: DC | PRN
  Filled 2018-11-29: qty 5

## 2018-11-29 MED ORDER — BENZTROPINE MESYLATE 2 MG PO TABS
2.0000 mg | ORAL_TABLET | Freq: Every day | ORAL | Status: DC
  Administered 2018-11-30 – 2018-12-03 (×4): 2 mg via ORAL
  Filled 2018-11-29 (×6): qty 1

## 2018-11-29 MED ORDER — ROSUVASTATIN CALCIUM 10 MG PO TABS
40.0000 mg | ORAL_TABLET | Freq: Every day | ORAL | Status: DC
  Administered 2018-11-29 – 2018-12-01 (×3): 40 mg via ORAL
  Filled 2018-11-29 (×3): qty 4
  Filled 2018-11-29: qty 2

## 2018-11-29 MED ORDER — MIDAZOLAM HCL 2 MG/2ML IJ SOLN
INTRAMUSCULAR | Status: AC
  Administered 2018-11-29: 2 mg via INTRAVENOUS
  Filled 2018-11-29: qty 2

## 2018-11-29 MED ORDER — SODIUM BICARBONATE 8.4 % IV SOLN
100.0000 meq | Freq: Once | INTRAVENOUS | Status: AC
  Administered 2018-11-29: 100 meq via INTRAVENOUS

## 2018-11-29 MED ORDER — INSULIN ASPART 100 UNIT/ML ~~LOC~~ SOLN
0.0000 [IU] | Freq: Three times a day (TID) | SUBCUTANEOUS | Status: DC
  Administered 2018-11-29: 2 [IU] via SUBCUTANEOUS
  Administered 2018-11-29: 9 [IU] via SUBCUTANEOUS
  Administered 2018-11-30 (×3): 2 [IU] via SUBCUTANEOUS
  Administered 2018-12-01: 7 [IU] via SUBCUTANEOUS
  Administered 2018-12-01 (×2): 2 [IU] via SUBCUTANEOUS
  Administered 2018-12-02: 7 [IU] via SUBCUTANEOUS
  Administered 2018-12-02: 5 [IU] via SUBCUTANEOUS
  Administered 2018-12-02 – 2018-12-03 (×3): 3 [IU] via SUBCUTANEOUS
  Administered 2018-12-03: 2 [IU] via SUBCUTANEOUS
  Administered 2018-12-04 (×2): 3 [IU] via SUBCUTANEOUS
  Filled 2018-11-29 (×16): qty 1

## 2018-11-29 MED ORDER — ROCURONIUM BROMIDE 50 MG/5ML IV SOLN
INTRAVENOUS | Status: AC
  Administered 2018-11-29: 40 mg via INTRAVENOUS
  Filled 2018-11-29: qty 1

## 2018-11-29 MED ORDER — PROPOFOL 1000 MG/100ML IV EMUL: 0.0000 ug/kg/min | INTRAVENOUS | Status: DC

## 2018-11-29 MED ORDER — DIPHENHYDRAMINE HCL 25 MG PO CAPS
25.0000 mg | ORAL_CAPSULE | Freq: Every day | ORAL | Status: DC
  Administered 2018-11-30 – 2018-12-03 (×4): 25 mg via ORAL
  Filled 2018-11-29 (×6): qty 1

## 2018-11-29 MED ORDER — FUROSEMIDE 10 MG/ML IJ SOLN
20.0000 mg | Freq: Once | INTRAMUSCULAR | Status: AC
  Administered 2018-11-29: 20 mg via INTRAVENOUS
  Filled 2018-11-29: qty 4

## 2018-11-29 MED ORDER — BUDESONIDE 0.5 MG/2ML IN SUSP
0.5000 mg | Freq: Two times a day (BID) | RESPIRATORY_TRACT | Status: DC
  Administered 2018-11-29 – 2018-12-04 (×11): 0.5 mg via RESPIRATORY_TRACT
  Filled 2018-11-29 (×13): qty 2

## 2018-11-29 MED ORDER — NOREPINEPHRINE 16 MG/250ML-% IV SOLN
0.0000 ug/min | INTRAVENOUS | Status: DC
  Administered 2018-11-29: 20 ug/min via INTRAVENOUS
  Filled 2018-11-29 (×2): qty 250

## 2018-11-29 MED ORDER — NITROGLYCERIN 0.4 MG SL SUBL: 0.4000 mg | SUBLINGUAL_TABLET | SUBLINGUAL | Status: DC | PRN

## 2018-11-29 MED ORDER — OSELTAMIVIR PHOSPHATE 75 MG PO CAPS
75.0000 mg | ORAL_CAPSULE | Freq: Two times a day (BID) | ORAL | Status: AC
  Administered 2018-11-30 – 2018-12-03 (×7): 75 mg via ORAL
  Filled 2018-11-29 (×8): qty 1

## 2018-11-29 MED ORDER — INSULIN ASPART 100 UNIT/ML ~~LOC~~ SOLN
0.0000 [IU] | Freq: Every day | SUBCUTANEOUS | Status: DC
  Administered 2018-12-01 – 2018-12-02 (×2): 3 [IU] via SUBCUTANEOUS
  Filled 2018-11-29 (×2): qty 1

## 2018-11-29 MED ORDER — OSELTAMIVIR PHOSPHATE 75 MG PO CAPS
75.0000 mg | ORAL_CAPSULE | Freq: Once | ORAL | Status: AC
  Administered 2018-11-29: 75 mg via ORAL
  Filled 2018-11-29: qty 1

## 2018-11-29 MED ORDER — IPRATROPIUM BROMIDE 0.02 % IN SOLN
0.5000 mg | RESPIRATORY_TRACT | Status: DC
  Filled 2018-11-29 (×2): qty 2.5

## 2018-11-29 MED ORDER — ORAL CARE MOUTH RINSE
15.0000 mL | OROMUCOSAL | Status: DC
  Administered 2018-11-29 – 2018-11-30 (×6): 15 mL via OROMUCOSAL

## 2018-11-29 MED ORDER — ROCURONIUM BROMIDE 50 MG/5ML IV SOLN
40.0000 mg | Freq: Once | INTRAVENOUS | Status: AC
  Administered 2018-11-29: 40 mg via INTRAVENOUS

## 2018-11-29 MED ORDER — EZETIMIBE 10 MG PO TABS
10.0000 mg | ORAL_TABLET | Freq: Every day | ORAL | Status: DC
  Administered 2018-11-29 – 2018-12-04 (×5): 10 mg via ORAL
  Filled 2018-11-29 (×6): qty 1

## 2018-11-29 MED ORDER — BISACODYL 10 MG RE SUPP: 10.0000 mg | Freq: Every day | RECTAL | Status: DC | PRN

## 2018-11-29 MED ORDER — MIDAZOLAM HCL 2 MG/2ML IJ SOLN
2.0000 mg | INTRAMUSCULAR | Status: DC | PRN
  Administered 2018-11-29 (×2): 2 mg via INTRAVENOUS
  Filled 2018-11-29: qty 2

## 2018-11-29 NOTE — ED Notes (Signed)
In to pt room. Pt sitting on the end of the bed, urine in the floor, bipap mask removed and pt showing increased work of breathing. Asked pt what happened and pt shrugged his shoulders. IV site in the right hand pulled out due to pt moving himself. Site cleaned and bandage applied. Heparin drip moved to the right AC. Pt cleaned, clothing removed, gown applied, undergarment applied. bipap mask placed back on pt and RT in to verify machine. O2 sats are increasing. Pt is diaphoretic. New IV established in the left wrist. Both IV sites reinforced with Kerlix. Pt is now resting with eyes closed.

## 2018-11-29 NOTE — Progress Notes (Signed)
ANTICOAGULATION CONSULT NOTE - Initial Consult  Pharmacy Consult for Heparin Dosing Indication: chest pain/ACS  Allergies  Allergen Reactions  . Asa [Aspirin]     Patient Measurements: Height: 6' (182.9 cm) Weight: 220 lb (99.8 kg) IBW/kg (Calculated) : 77.6 Heparin Dosing Weight: 97.8  Vital Signs: Temp: 97.9 F (36.6 C) (02/07 0740) Temp Source: Axillary (02/07 0740) BP: 150/103 (02/07 0936) Pulse Rate: 128 (02/07 0936)  Labs: Recent Labs    11/29/18 0739  HGB 14.7  HCT 46.5  PLT 308  APTT 30  LABPROT 15.6*  INR 1.25  CREATININE 1.44*  TROPONINI 0.60*    Estimated Creatinine Clearance: 74.3 mL/min (A) (by C-G formula based on SCr of 1.44 mg/dL (H)).   Medical History: Past Medical History:  Diagnosis Date  . COPD (chronic obstructive pulmonary disease) (HCC)   . Diabetes mellitus without complication (HCC)   . Hypertension   . Mental health disorder      Goal of Therapy:  Heparin level 0.3-0.7 units/ml Monitor platelets by anticoagulation protocol: Yes   Plan:  Give 4000 units bolus x 1 Start heparin infusion at 1200 units/hr  Paul Hernandez 11/29/2018,9:40 AM

## 2018-11-29 NOTE — ED Notes (Signed)
Spoke with Dr Elisabeth Pigeon about pt and recent incident and current status. Dr Elisabeth Pigeon reassessed pt and gave no new orders and stated pt was stable to go to the unit.

## 2018-11-29 NOTE — Progress Notes (Signed)
Inpatient Diabetes Program Recommendations  AACE/ADA: New Consensus Statement on Inpatient Glycemic Control   Target Ranges:  Prepandial:   less than 140 mg/dL      Peak postprandial:   less than 180 mg/dL (1-2 hours)      Critically ill patients:  140 - 180 mg/dL   Results for KAEDEN, WYRICK (MRN [id number]) as of 11/29/2018 12:19  Ref. Range 11/29/2018 11:15  Glucose-Capillary Latest Ref Range: 70 - 99 mg/dL 169 (H)  Results for RONELL, CARLON (MRN [id number]) as of 11/29/2018 12:19  Ref. Range 11/29/2018 07:39  Glucose Latest Ref Range: 70 - 99 mg/dL 003 (H)   Review of Glycemic Control  Diabetes history: DM2 Outpatient Diabetes medications: Farxiga 5 mg daily, Metformin 1500 mg QAM, Metformin 1000 mg QPM Current orders for Inpatient glycemic control: Novolog 0-9 units TID with meals, Novolog 0-5 units QHS; Solumedrol 60 mg Q6H  Inpatient Diabetes Program Recommendations:   Insulin - Basal: If steroids are continued and glucose is consistently greater than 180 mg/dl, please consider ordering Lantus 10 units Q24H.  HbgA1C: Please consider ordering an A1C to evaluate glycemic control over the past 2-3 months.  Thanks, Orlando Penner, RN, MSN, CDE Diabetes Coordinator Inpatient Diabetes Program [phone] (Team Pager from 8am to 5pm)

## 2018-11-29 NOTE — ED Notes (Signed)
Attempt to give report X 1 unsuccessful.

## 2018-11-29 NOTE — Consult Note (Signed)
Cardiology Consultation Note    Patient ID: Paul ParcelDennis Drummonds, MRN: [id number], DOB/AGE: April 14, 1966 53 y.o. Admit date: 11/29/2018   Date of Consult: 11/29/2018 Primary Physician: [name], [name], MD Primary Cardiologist:   Dr. Juliann Paresallwood  Chief Complaint: sob Reason for Consultation: abnormal troponin Requesting MD: Dr. Elisabeth PigeonVachhani  HPI: Paul Hernandez is a 53 y.o. male with history of schizoaffective disorder, hypertension, hyperlipidemia anemia, COPD, diabetes, morbid obesity who was admitted after presenting to the emergency room with complaints increasing shortness of breath.  Patient is not able to give history.  Per chart patient presented with shortness of breath.  He has a known ejection fraction of 25 to 30%.  He was noted to be hypoxic and placed on BiPAP.  He was hemodynamically stable.  Per protocol troponin was ordered and returned as 0.6 with subsequent value increasing to 1.42.  Electrocardiogram showed tachycardia with intermittent PVCs.  There was no injury current.  He had acute on chronic renal insufficiency with a serum creatinine increased to 1.44.  Hemoglobin was normal.  BN P was greater than 3000.  He was influenza A+.  He is currently on BiPAP..  Mildly hypotensive with a blood pressure of 92/64.  Chest x-ray showed no pulmonary edema or consolidation.  He was placed on heparin.  Past Medical History:  Diagnosis Date  . CHF (congestive heart failure) (HCC)   . COPD (chronic obstructive pulmonary disease) (HCC)   . Diabetes mellitus without complication (HCC)   . Hypertension   . Mental health disorder       Surgical History:  Past Surgical History:  Procedure Laterality Date  . NO PAST SURGERIES       Home Meds: Prior to Admission medications   Medication Sig Start Date End Date Taking? Authorizing Provider  albuterol (PROVENTIL HFA;VENTOLIN HFA) 108 (90 Base) MCG/ACT inhaler Inhale 2 puffs into the lungs every 6 (six) hours as needed for wheezing or shortness of  breath. Patient taking differently: Inhale 1-2 puffs into the lungs every 4 (four) hours as needed for wheezing or shortness of breath.  08/13/17  Yes Summers, Rhonda L, PA-C  albuterol (PROVENTIL) (2.5 MG/3ML) 0.083% nebulizer solution Take 2.5 mg by nebulization 4 (four) times daily as needed for wheezing or shortness of breath.    Yes [provider]  amLODipine (NORVASC) 5 MG tablet Take 10 mg by mouth daily.   Yes [provider]  Azilsartan-Chlorthalidone (EDARBYCLOR) 40-12.5 MG TABS Take 1 tablet by mouth daily.   Yes [provider]  benztropine (COGENTIN) 2 MG tablet Take 2 mg by mouth at bedtime.   Yes [provider]  carvedilol (COREG) 25 MG tablet Take 1 tablet (25 mg total) by mouth 2 (two) times daily with a meal. 11/16/17  Yes Enedina FinnerPatel, Sona, MD  Cholecalciferol (VITAMIN D3) 5000 units CAPS Take 5,000 Units by mouth daily.   Yes [provider]  dapagliflozin propanediol (FARXIGA) 5 MG TABS tablet Take 5 mg by mouth daily.   Yes [provider]  diphenhydrAMINE (BENADRYL) 25 mg capsule Take 25 mg by mouth at bedtime.    Yes [provider]  ezetimibe (ZETIA) 10 MG tablet Take 10 mg by mouth daily.   Yes [provider]  fluticasone (FLONASE) 50 MCG/ACT nasal spray Place 1 spray into both nostrils daily.   Yes [provider]  guaifenesin (ROBITUSSIN) 100 MG/5ML syrup Take 200 mg by mouth every 4 (four) hours as needed for cough.   Yes [provider]  haloperidol decanoate (HALDOL DECANOATE) 100 MG/ML injection Inject 90 mg into the muscle every 28 (twenty-eight) days.    Yes [provider]  isosorbide-hydrALAZINE (BIDIL) 20-37.5 MG tablet Take 1 tablet by mouth 3 (three) times daily.   Yes [provider]  metFORMIN (GLUCOPHAGE) 500 MG tablet Take 1,500 mg by mouth daily with breakfast.    Yes [provider]  metFORMIN (GLUCOPHAGE) 500 MG tablet Take 1,000 mg by mouth  every evening.   Yes [provider]  rosuvastatin (CRESTOR) 40 MG tablet Take 40 mg by mouth daily.   Yes [provider]  umeclidinium-vilanterol (ANORO ELLIPTA) 62.5-25 MCG/INH AEPB Inhale 1 puff into the lungs daily.   Yes [provider]  aspirin EC 325 MG tablet Take 1 tablet (325 mg total) by mouth daily. 11/16/17   Enedina Finner, MD  benazepril (LOTENSIN) 20 MG tablet Take 1 tablet (20 mg total) by mouth daily. 11/16/17   Enedina Finner, MD  dextrose (GLUTOSE) 40 % GEL Take by mouth once as needed for low blood sugar. For blood sugar <70    [provider]  triamterene-hydrochlorothiazide (MAXZIDE) 75-50 MG tablet Take 1 tablet by mouth every morning.     [provider]    Inpatient Medications:  . aspirin EC  325 mg Oral Daily  . benztropine  2 mg Oral QHS  . budesonide (PULMICORT) nebulizer solution  0.5 mg Nebulization BID  . carvedilol  25 mg Oral BID WC  . chlorhexidine  15 mL Mouth Rinse BID  . diphenhydrAMINE  25 mg Oral QHS  . ezetimibe  10 mg Oral Daily  . fluticasone  1 spray Each Nare Daily  . insulin aspart  0-5 Units Subcutaneous QHS  . insulin aspart  0-9 Units Subcutaneous TID WC  . ipratropium-albuterol  3 mL Nebulization Q4H  . isosorbide-hydrALAZINE  1 tablet Oral TID  . losartan  50 mg Oral Daily  . [START ON 11/30/2018] mouth rinse  15 mL Mouth Rinse q12n4p  . methylPREDNISolone (SOLU-MEDROL) injection  60 mg Intravenous Q6H  . oseltamivir  75 mg Oral BID  . rosuvastatin  40 mg Oral Daily  . tiotropium  18 mcg Inhalation Daily   . heparin 1,200 Units/hr (11/29/18 1721)    Allergies:  Allergies  Allergen Reactions  . [name] [Aspirin]     Social History   Socioeconomic History  . Marital status: Single    Spouse name: Not on file  . Number of children: Not on file  . Years of education: Not on file  . Highest education level: Not on file  Occupational History  . Not on file  Social Needs  . Financial resource  strain: Not on file  . Food insecurity:    Worry: Not on file    Inability: Not on file  . Transportation needs:    Medical: Not on file    Non-medical: Not on file  Tobacco Use  . Smoking status: Current Some Day Smoker    Packs/day: 0.50  . Smokeless tobacco: Never Used  Substance and Sexual Activity  . Alcohol use: No  . Drug use: No  . Sexual activity: Not on file  Lifestyle  . Physical activity:    Days per week: Not on file    Minutes per session: Not on file  . Stress: Not on file  Relationships  . Social connections:    Talks on phone: Not on file    Gets together: Not on file  Attends religious service: Not on file    Active member of club or organization: Not on file    Attends meetings of clubs or organizations: Not on file    Relationship status: Not on file  . Intimate partner violence:    Fear of current or ex partner: Not on file    Emotionally abused: Not on file    Physically abused: Not on file    Forced sexual activity: Not on file  Other Topics Concern  . Not on file  Social History Narrative  . Not on file     Family History  Problem Relation Age of Onset  . CVA Mother   . CAD Father      Review of Systems: A 12-system review of systems was performed and is negative except as noted in the HPI.  Labs: Recent Labs    11/29/18 0739 11/29/18 1019 11/29/18 1741  TROPONINI 0.60* 0.74* 1.42*   Lab Results  Component Value Date   WBC 7.6 11/29/2018   HGB 13.7 11/29/2018   HCT 44.2 11/29/2018   MCV 94.6 11/29/2018   PLT 246 11/29/2018    Recent Labs  Lab 11/29/18 0739 11/29/18 1741  NA 137  --   K 4.4  --   CL 101  --   CO2 27  --   BUN 25*  --   CREATININE 1.44* 1.59*  CALCIUM 8.1*  --   GLUCOSE 249*  --    Lab Results  Component Value Date   CHOL 210 (H) 11/29/2018   HDL 47 11/29/2018   LDLCALC 149 (H) 11/29/2018   TRIG 68 11/29/2018   No results found for: DDIMER  Radiology/Studies:  Dg Chest Port 1 View  Result  Date: 11/29/2018 CLINICAL DATA:  Shortness of breath EXAM: PORTABLE CHEST 1 VIEW COMPARISON:  October 25, 2017 FINDINGS: No edema or consolidation. Heart is mildly enlarged with pulmonary vascularity normal. No adenopathy. No bone lesions. IMPRESSION: No edema or consolidation.  Mild cardiac enlargement. Electronically Signed   By: Bretta BangWilliam  Woodruff III M.D.   On: 11/29/2018 07:51    Wt Readings from Last 3 Encounters:  11/29/18 99.8 kg  10/08/18 106.6 kg  11/29/17 101.6 kg    EKG: Sinus tachycardia with PVCs.  No injury current.  Physical Exam: Acutely ill-appearing obese African-American male.  On BiPAP. Blood pressure 92/64, pulse 89, temperature 98.5 F (36.9 C), temperature source Oral, resp. rate (!) 25, height 6' (1.829 m), weight 99.8 kg, SpO2 97 %. Body mass index is 29.84 kg/m. General: Well developed, well nourished, in no acute distress. Head: Normocephalic, atraumatic, sclera non-icteric, no xanthomas, nares are without discharge.  Neck: Negative for carotid bruits. JVD not elevated. Lungs: Decreased breath sounds bilaterally. Heart: Tachycardic with S1 S2. No murmurs, rubs, or gallops appreciated. Abdomen: Soft, non-tender, non-distended with normoactive bowel sounds. No hepatomegaly. No rebound/guarding. No obvious abdominal masses. Msk:  Strength and tone appear normal for age. Extremities: No clubbing or cyanosis. No edema.  Distal pedal pulses are 2+ and equal bilaterally. Neuro: On BiPAP with nonfocal findings.  Does not respond to questions.     Assessment and Plan  53 year old male with schizoaffective disorder, morbid obesity, hypertension, history of cardiomyopathy with a reported EF of 25 to 30% who presented to emergency room with complaints of shortness of breath.  Chest x-ray revealed no pulmonary edema.  He was in respiratory failure requiring BiPAP.  Troponin was drawn.  Patient does not able to give a history  of chest pain.  There were no injury findings on his  electrocardiogram.  Troponin is peaked thus far at 1.42.  Abnormal troponin-acute coronary syndrome versus demand ischemia.  EKG does not suggest acute ST elevation myocardial infarction.  Would continue with heparin and follow hemodynamics and respiratory symptoms.  After respiratory status stabilized consideration for invasive versus noninvasive ischemic work-up is warranted.  Cardiomyopathy-history of an EF of 25 to 30%.  Once stabilized from a respiratory standpoint repeat echocardiogram to assess any interval change in his LV function appears warranted.  Patient is relatively hypotensive not a candidate for afterload reduction at present.  We will continue with rosuvastatin.  Respiratory failure.  Aggressive treat with bronchodilators and BiPAP.  Wean as tolerated.  Signed, Dalia Heading MD 11/29/2018, 6:25 PM Pager: [phone]

## 2018-11-29 NOTE — ED Notes (Signed)
ED TO INPATIENT HANDOFF REPORT  ED Nurse Name and Phone #: Connye Burkittlly [phone]  Name/Age/Gender Paul Hernandez 53 y.o. male Room/Bed: ED25A/ED25A  Code Status   Code Status: Full Code  Home/SNF/Other group home Patient oriented to: self, place, time and situation Is this baseline? Yes   Triage Complete: Triage complete  Chief Complaint respiratory distress  Triage Note To ER via [facility] from group home, a [facility] c/o SOB. Pt in respiratory distress upon first responder arrival. Given 2 duoneb with no improvement. Placed on CPAP. Arrives to hospital on CPAP. Pt 79% on RA upon initial arrival. EDP and RT at bedside.   Allergies Allergies  Allergen Reactions  . Asa [Aspirin]     Level of Care/Admitting Diagnosis ED Disposition    ED Disposition Condition Comment   Admit  Hospital Area: [facility] [[id number]]  Level of Care: Stepdown [14]  Diagnosis: Acute respiratory failure with hypoxia [facility](HCC) [[id number]][[id number]]  Admitting Physician: Altamese DillingVACHHANI, VAIBHAVKUMAR [phone][[id number]]  Attending Physician: Altamese DillingVACHHANI, VAIBHAVKUMAR [phone][[id number]]  Estimated length of stay: past midnight tomorrow  Certification:: I certify this patient will need inpatient services for at least 2 midnights  PT Class (Do Not Modify): Inpatient [101]  PT Acc Code (Do Not Modify): Private [1]       Medical/Surgery History Past Medical History:  Diagnosis Date  . CHF (congestive heart failure) (HCC)   . COPD (chronic obstructive pulmonary disease) (HCC)   . Diabetes mellitus without complication (HCC)   . Hypertension   . Mental health disorder    Past Surgical History:  Procedure Laterality Date  . NO PAST SURGERIES       IV Location/Drains/Wounds Patient Lines/Drains/Airways Status   Active Line/Drains/Airways    Name:   Placement date:   Placement time:   Site:   Days:   Peripheral IV 11/29/18 Right Hand   11/29/18    0739    Hand   less than 1   Peripheral IV 11/29/18 Right Antecubital    11/29/18    0749    Antecubital   less than 1          Intake/Output Last 24 hours  Intake/Output Summary (Last 24 hours) at 11/29/2018 1505 Last data filed at 11/29/2018 1334 Gross per 24 hour  Intake -  Output 600 ml  Net -600 ml    Labs/Imaging Results for orders placed or performed during the hospital encounter of 11/29/18 (from the past 48 hour(s))  CBC with Differential     Status: Abnormal   Collection Time: 11/29/18  7:39 AM  Result Value Ref Range   WBC 9.6 4.0 - 10.5 K/uL   RBC 5.07 4.22 - 5.81 MIL/uL   Hemoglobin 14.7 13.0 - 17.0 g/dL   HCT 69.646.5 29.539.0 - 28.452.0 %   MCV 91.7 80.0 - 100.0 fL   MCH 29.0 26.0 - 34.0 pg   MCHC 31.6 30.0 - 36.0 g/dL   RDW 13.213.9 44.011.5 - 10.215.5 %   Platelets 308 150 - 400 K/uL   nRBC 0.0 0.0 - 0.2 %   Neutrophils Relative % 92 %   Neutro Abs 8.7 (H) 1.7 - 7.7 K/uL   Lymphocytes Relative 2 %   Lymphs Abs 0.2 (L) 0.7 - 4.0 K/uL   Monocytes Relative 5 %   Monocytes Absolute 0.5 0.1 - 1.0 K/uL   Eosinophils Relative 0 %   Eosinophils Absolute 0.0 0.0 - 0.5 K/uL   Basophils Relative 0 %   Basophils Absolute 0.0 0.0 -  0.1 K/uL   Immature Granulocytes 1 %   Abs Immature Granulocytes 0.13 (H) 0.00 - 0.07 K/uL    Comment: Performed at [facility], [address] [place], [place] [address]  Basic metabolic panel     Status: Abnormal   Collection Time: 11/29/18  7:39 AM  Result Value Ref Range   Sodium 137 135 - 145 mmol/L   Potassium 4.4 3.5 - 5.1 mmol/L   Chloride 101 98 - 111 mmol/L   CO2 27 22 - 32 mmol/L   Glucose, Bld 249 (H) 70 - 99 mg/dL   BUN 25 (H) 6 - 20 mg/dL   Creatinine, Ser 5.68 (H) 0.61 - 1.24 mg/dL   Calcium 8.1 (L) 8.9 - 10.3 mg/dL   GFR calc non Af Amer 56 (L) >60 mL/min   GFR calc Af Amer >60 >60 mL/min   Anion gap 9 5 - 15    Comment: Performed at [facility], [address] [place], [place] [address]  Brain natriuretic peptide     Status: Abnormal   Collection Time: 11/29/18  7:39 AM  Result Value  Ref Range   B Natriuretic Peptide 3,045.0 (H) 0.0 - 100.0 pg/mL    Comment: Performed at [facility], [address] [place], [place] [address]  Troponin I - Once     Status: Abnormal   Collection Time: 11/29/18  7:39 AM  Result Value Ref Range   Troponin I 0.60 (HH) <0.03 ng/mL    Comment: CRITICAL RESULT CALLED TO, READ BACK BY AND VERIFIED WITH ALISON Alfreddie Consalvo AT 1115 11/29/2018 SMA/PMF Performed at [facility], [address] [place], [place] [address]   Blood gas, venous     Status: Abnormal   Collection Time: 11/29/18  7:39 AM  Result Value Ref Range   FIO2 0.50    Delivery systems BILEVEL POSITIVE AIRWAY PRESSURE    pH, Ven 7.22 (L) 7.250 - 7.430   pCO2, Ven 68 (H) 44.0 - 60.0 mmHg   pO2, Ven 110.0 (H) 32.0 - 45.0 mmHg   Bicarbonate 27.8 20.0 - 28.0 mmol/L   Acid-base deficit 1.5 0.0 - 2.0 mmol/L   O2 Saturation 97.3 %   Patient temperature 37.0    Collection site VEIN    Sample type VENOUS     Comment: Performed at [facility], [address], [place], [place] [address]  Influenza panel by PCR (type A & B)     Status: Abnormal   Collection Time: 11/29/18  7:39 AM  Result Value Ref Range   Influenza A By PCR POSITIVE (A) NEGATIVE   Influenza B By PCR NEGATIVE NEGATIVE    Comment: (NOTE) The Xpert Xpress Flu assay is intended as an aid in the diagnosis of  influenza and should not be used as a sole basis for treatment.  This  assay is FDA approved for nasopharyngeal swab specimens only. Nasal  washings and aspirates are unacceptable for Xpert Xpress Flu testing. Performed at [facility], [address] [place], [place] [address]   Protime-INR     Status: Abnormal   Collection Time: 11/29/18  7:39 AM  Result Value Ref Range   Prothrombin Time 15.6 (H) 11.4 - 15.2 seconds   INR 1.25     Comment: Performed at [facility], [address], [place], [place] [address]  APTT     Status: None   Collection Time: 11/29/18   7:39 AM  Result Value Ref Range  aPTT 30 24 - 36 seconds    Comment: Performed at [facility], [address] [place], [place] [address]  Lipid panel     Status: Abnormal   Collection Time: 11/29/18  7:39 AM  Result Value Ref Range   Cholesterol 210 (H) 0 - 200 mg/dL   Triglycerides 68 <962<150 mg/dL   HDL 47 >95>40 mg/dL   Total CHOL/HDL Ratio 4.5 RATIO   VLDL 14 0 - 40 mg/dL   LDL Cholesterol 284149 (H) 0 - 99 mg/dL    Comment:        Total Cholesterol/HDL:CHD Risk Coronary Heart Disease Risk Table                     Men   Women  1/2 Average Risk   3.4   3.3  Average Risk       5.0   4.4  2 X Average Risk   9.6   7.1  3 X Average Risk  23.4   11.0        Use the calculated Patient Ratio above and the CHD Risk Table to determine the patient's CHD Risk.        ATP III CLASSIFICATION (LDL):  <100     mg/dL   Optimal  132-440100-129  mg/dL   Near or Above                    Optimal  130-159  mg/dL   Borderline  102-725160-189  mg/dL   High  >366>190     mg/dL   Very High Performed at [facility], [address] [place], [place] [address]   Troponin I - Now Then Q6H     Status: Abnormal   Collection Time: 11/29/18 10:19 AM  Result Value Ref Range   Troponin I 0.74 (HH) <0.03 ng/mL    Comment: CRITICAL VALUE NOTED. VALUE IS CONSISTENT WITH PREVIOUSLY REPORTED/CALLED VALUE SMA/PMF Performed at [facility], [address] [place], [place] [address]   Glucose, capillary     Status: Abnormal   Collection Time: 11/29/18 11:15 AM  Result Value Ref Range   Glucose-Capillary 363 (H) 70 - 99 mg/dL   Dg Chest Port 1 View  Result Date: 11/29/2018 CLINICAL DATA:  Shortness of breath EXAM: PORTABLE CHEST 1 VIEW COMPARISON:  October 25, 2017 FINDINGS: No edema or consolidation. Heart is mildly enlarged with pulmonary vascularity normal. No adenopathy. No bone lesions. IMPRESSION: No edema or consolidation.  Mild cardiac enlargement. Electronically Signed   By: Bretta BangWilliam  Woodruff  III M.D.   On: 11/29/2018 07:51    Pending Labs Unresulted Labs (From admission, onward)    Start     Ordered   11/30/18 0500  Basic metabolic panel  Tomorrow morning,   STAT     11/29/18 1255   11/30/18 0500  CBC  Tomorrow morning,   STAT     11/29/18 1255   11/29/18 1530  Heparin level (unfractionated)  Once-Timed,   STAT     11/29/18 0945   11/29/18 1256  HIV antibody (Routine Testing)  Once,   STAT     11/29/18 1255   11/29/18 1256  CBC  (heparin)  Once,   STAT    Comments:  Baseline for heparin therapy IF NOT ALREADY DRAWN.  Notify MD if PLT < 100 K.    11/29/18 1255   11/29/18 1256  Creatinine, serum  (heparin)  Once,   STAT  Comments:  Baseline for heparin therapy IF NOT ALREADY DRAWN.    11/29/18 1255   11/29/18 1053  Hemoglobin A1c  Add-on,   AD     11/29/18 1053   11/29/18 1005  Troponin I - Now Then Q6H  Now then every 6 hours,   STAT     11/29/18 1005          Vitals/Pain Today's Vitals   11/29/18 1421 11/29/18 1443 11/29/18 1445 11/29/18 1458  BP: (!) 129/100     Pulse: (!) 118   (!) 104  Resp: (!) 34  (!) 38 (!) 23  Temp:  98.4 F (36.9 C)    TempSrc:  Oral    SpO2: 98%   98%  Weight:      Height:        Isolation Precautions Droplet precaution  Medications Medications  heparin ADULT infusion 100 units/mL (25000 units/253mL sodium chloride 0.45%) (1,200 Units/hr Intravenous New Bag/Given 11/29/18 0931)  aspirin EC tablet 325 mg (325 mg Oral Refused 11/29/18 1323)  carvedilol (COREG) tablet 25 mg (has no administration in time range)  ezetimibe (ZETIA) tablet 10 mg (10 mg Oral Given 11/29/18 1319)  isosorbide-hydrALAZINE (BIDIL) 20-37.5 MG per tablet 1 tablet (has no administration in time range)  rosuvastatin (CRESTOR) tablet 40 mg (40 mg Oral Given 11/29/18 1319)  benztropine (COGENTIN) tablet 2 mg (has no administration in time range)  diphenhydrAMINE (BENADRYL) capsule 25 mg (has no administration in time range)  fluticasone (FLONASE) 50 MCG/ACT  nasal spray 1 spray (1 spray Each Nare Given 11/29/18 1321)  docusate sodium (COLACE) capsule 100 mg (has no administration in time range)  ipratropium-albuterol (DUONEB) 0.5-2.5 (3) MG/3ML nebulizer solution 3 mL (3 mLs Nebulization Given 11/29/18 1117)  budesonide (PULMICORT) nebulizer solution 0.5 mg (0.5 mg Nebulization Given 11/29/18 1122)  methylPREDNISolone sodium succinate (SOLU-MEDROL) 125 mg/2 mL injection 60 mg (60 mg Intravenous Not Given 11/29/18 1106)  tiotropium (SPIRIVA) inhalation capsule ([facility] use ONLY) 18 mcg (18 mcg Inhalation Given 11/29/18 1319)  nitroGLYCERIN (NITROSTAT) SL tablet 0.4 mg (has no administration in time range)  oseltamivir (TAMIFLU) capsule 75 mg (75 mg Oral Not Given 11/29/18 1254)  insulin aspart (novoLOG) injection 0-9 Units (9 Units Subcutaneous Given 11/29/18 1122)  insulin aspart (novoLOG) injection 0-5 Units (has no administration in time range)  losartan (COZAAR) tablet 50 mg (50 mg Oral Given 11/29/18 1443)  methylPREDNISolone sodium succinate (SOLU-MEDROL) 125 mg/2 mL injection 125 mg (125 mg Intravenous Given 11/29/18 0745)  ipratropium-albuterol (DUONEB) 0.5-2.5 (3) MG/3ML nebulizer solution (3 mLs  Given 11/29/18 0748)  oseltamivir (TAMIFLU) capsule 75 mg (75 mg Oral Given 11/29/18 0843)  furosemide (LASIX) injection 20 mg (20 mg Intravenous Given 11/29/18 0857)  heparin bolus via infusion 4,000 Units (4,000 Units Intravenous Bolus from Bag 11/29/18 0931)  carvedilol (COREG) tablet 25 mg (25 mg Oral Given 11/29/18 1319)    Mobility walks Low fall risk   Focused Assessments   Recommendations: See Admitting Provider Note  Report given to:   Additional Notes:

## 2018-11-29 NOTE — ED Notes (Signed)
Date and time results received: 11/29/18 8:24 AM  (use smartphrase ".now" to insert current time)  Test: troponin Critical Value: 0.60 Name of Provider Notified: Dr. Mayford Knife  Orders Received? Or Actions Taken?:

## 2018-11-29 NOTE — ED Provider Notes (Signed)
[facility] Emergency Department Provider Note       Time seen: ----------------------------------------- 7:38 AM on 11/29/2018 -----------------------------------------   I have reviewed the triage vital signs and the nursing notes.  HISTORY   Chief Complaint Respiratory Distress Level V caveat: History/ROS limited by respiratory distress   HPI Paul Hernandez is a 53 y.o. male with a history of COPD, diabetes, hypertension, schizophrenia who presents to the ED for respiratory distress.  Patient reportedly tried his inhalers this morning with no improvement.  Reports he is never been the short of breath before.  He arrives on CPAP and has difficulty talking.  No further information is available at this time.  Past Medical History:  Diagnosis Date  . COPD (chronic obstructive pulmonary disease) (HCC)   . Diabetes mellitus without complication (HCC)   . Hypertension   . Mental health disorder     Patient Active Problem List   Diagnosis Date Noted  . Hyperlipidemia, unspecified 11/29/2017  . Hypertension 11/29/2017  . Schizophrenia (HCC) 11/16/2017  . COPD (chronic obstructive pulmonary disease) (HCC) 11/15/2017  . Mental health disorder 11/15/2017  . Cerebral ischemia 11/15/2017  . Cerebral infarct (HCC) 11/15/2017  . Benign essential HTN 11/13/2017  . Diabetes type 2, controlled (HCC) 11/13/2017  . COPD exacerbation (HCC) 09/16/2017    Past Surgical History:  Procedure Laterality Date  . NO PAST SURGERIES      Allergies Asa [aspirin]  Social History Social History   Tobacco Use  . Smoking status: Current Some Day Smoker    Packs/day: 0.50  . Smokeless tobacco: Never Used  Substance Use Topics  . Alcohol use: No  . Drug use: No   Review of Systems Constitutional: Negative for fever. Cardiovascular: Negative for chest pain. Respiratory: Positive shortness of breath Gastrointestinal: Negative for abdominal pain Musculoskeletal:  Negative for back pain. Neurological: Negative for headaches, focal weakness or numbness.  All systems otherwise unknown  ____________________________________________   PHYSICAL EXAM:  VITAL SIGNS: ED Triage Vitals [11/29/18 0735]  Enc Vitals Group     BP      Pulse      Resp      Temp      Temp src      SpO2      Weight 220 lb (99.8 kg)     Height 6' (1.829 m)     Head Circumference      Peak Flow      Pain Score      Pain Loc      Pain Edu?      Excl. in GC?    Constitutional: Alert and oriented.  Moderate distress Eyes: Conjunctivae are normal. Normal extraocular movements. ENT      Head: Normocephalic and atraumatic.      Nose: No congestion/rhinnorhea.      Mouth/Throat: Mucous membranes are moist.      Neck: No stridor. Cardiovascular: Normal rate, regular rhythm. No murmurs, rubs, or gallops. Respiratory: Tachypnea with wheezing bilaterally, questionable rales Gastrointestinal: Soft and nontender. Normal bowel sounds Musculoskeletal: Nontender with normal range of motion in extremities.  Mild edema is noted Neurologic:  Normal speech and language. No gross focal neurologic deficits are appreciated.  Skin:  Skin is warm, dry and intact. No rash noted. Psychiatric: Mood and affect are normal. Speech and behavior are normal.  ____________________________________________  EKG: Interpreted by me.  Sinus tachycardia with a rate of 1 128 bpm, PVC, wide QRS long QT, normal axis.  EKG: Repeat EKG  interpreted by me, sinus tachycardia with a rate of 119 bpm.  PVC, wide QRS, nonspecific T wave changes, normal axis, improved QT ____________________________________________  ED COURSE:  As part of my medical decision making, I reviewed the following data within the electronic MEDICAL RECORD NUMBER History obtained from family if available, nursing notes, old chart and ekg, as well as notes from prior ED visits. Patient presented for respiratory distress, we will assess with  labs and imaging as indicated at this time. Clinical Course as of Nov 29 834  Fri Nov 29, 2018  8270 Patient appears to be improving on BiPAP   [JW]  0834 Influenza A By PCR(!): POSITIVE [JW]    Clinical Course User Index [JW] Emily Filbert, MD   Procedures ____________________________________________   LABS (pertinent positives/negatives)  Labs Reviewed  CBC WITH DIFFERENTIAL/PLATELET - Abnormal; Notable for the following components:      Result Value   Neutro Abs 8.7 (*)    Lymphs Abs 0.2 (*)    Abs Immature Granulocytes 0.13 (*)    All other components within normal limits  BASIC METABOLIC PANEL - Abnormal; Notable for the following components:   Glucose, Bld 249 (*)    BUN 25 (*)    Creatinine, Ser 1.44 (*)    Calcium 8.1 (*)    GFR calc non Af Amer 56 (*)    All other components within normal limits  TROPONIN I - Abnormal; Notable for the following components:   Troponin I 0.60 (*)    All other components within normal limits  BLOOD GAS, VENOUS - Abnormal; Notable for the following components:   pH, Ven 7.22 (*)    pCO2, Ven 68 (*)    pO2, Ven 110.0 (*)    All other components within normal limits  INFLUENZA PANEL BY PCR (TYPE A & B) - Abnormal; Notable for the following components:   Influenza A By PCR POSITIVE (*)    All other components within normal limits  BRAIN NATRIURETIC PEPTIDE   CRITICAL CARE Performed by: Ulice Dash   Total critical care time: 30 minutes  Critical care time was exclusive of separately billable procedures and treating other patients.  Critical care was necessary to treat or prevent imminent or life-threatening deterioration.  Critical care was time spent personally by me on the following activities: development of treatment plan with patient and/or surrogate as well as nursing, discussions with consultants, evaluation of patient's response to treatment, examination of patient, obtaining history from patient or  surrogate, ordering and performing treatments and interventions, ordering and review of laboratory studies, ordering and review of radiographic studies, pulse oximetry and re-evaluation of patient's condition.  RADIOLOGY Images were viewed by me  Chest x-ray  IMPRESSION: No edema or consolidation. Mild cardiac enlargement. ____________________________________________   DIFFERENTIAL DIAGNOSIS   CHF, COPD, pneumonia, influenza, pneumothorax, PE  FINAL ASSESSMENT AND PLAN  Acute respiratory distress, COPD exacerbation, elevated troponin, influenza   Plan: The patient had presented for acute respiratory distress and was immediately changed from CPAP to BiPAP. Patient's labs did indicate hypercarbic respiratory failure for which she was placed on BiPAP.  Likely COPD exacerbation triggered by influenza A.  Troponin was also markedly elevated with no obvious ST elevation on EKG.  I have ordered heparin for him given his elevated troponin.  Patient's imaging did not reveal any acute process.  He received IV steroids as well as nebulizer treatment via BiPAP.  We also gave him Tamiflu.  This seems to  be likely viral and not bacterial.  I will discuss with the hospitalist for admission.   Ulice DashJohnathan E Williams, MD    Note: This note was generated in part or whole with voice recognition software. Voice recognition is usually quite accurate but there are transcription errors that can and very often do occur. I apologize for any typographical errors that were not detected and corrected.     Emily FilbertWilliams, Jonathan E, MD 11/29/18 [phone]

## 2018-11-29 NOTE — Progress Notes (Signed)
ANTICOAGULATION CONSULT NOTE   Pharmacy Consult for Heparin Dosing Indication: chest pain/ACS  Allergies  Allergen Reactions  . Asa [Aspirin]     Patient Measurements: Height: 6' (182.9 cm) Weight: 220 lb (99.8 kg) IBW/kg (Calculated) : 77.6 Heparin Dosing Weight: 97.8  Vital Signs: Temp: 98.5 F (36.9 C) (02/07 1721) Temp Source: Oral (02/07 1721) BP: 92/64 (02/07 1721) Pulse Rate: 89 (02/07 1721)  Labs: Recent Labs    11/29/18 0739 11/29/18 1019 11/29/18 1741  HGB 14.7  --  13.7  HCT 46.5  --  44.2  PLT 308  --  246  APTT 30  --   --   LABPROT 15.6*  --   --   INR 1.25  --   --   HEPARINUNFRC  --   --  0.57  CREATININE 1.44*  --  1.59*  TROPONINI 0.60* 0.74* 1.42*    Estimated Creatinine Clearance: 67.2 mL/min (A) (by C-G formula based on SCr of 1.59 mg/dL (H)).   Medical History: Past Medical History:  Diagnosis Date  . CHF (congestive heart failure) (HCC)   . COPD (chronic obstructive pulmonary disease) (HCC)   . Diabetes mellitus without complication (HCC)   . Hypertension   . Mental health disorder      Goal of Therapy:  Heparin level 0.3-0.7 units/ml Monitor platelets by anticoagulation protocol: Yes   Plan:  Give 4000 units bolus x 1 Start heparin infusion at 1200 units/hr   02/07 1741  HL= 0.57. Will continue current drip rate and check confirmatory level in 6 hours.  Satori Krabill A 11/29/2018,6:44 PM

## 2018-11-29 NOTE — ED Notes (Addendum)
Pt placed back on BIPAP due to increased WOB. Pt demonstrates tachypnea and agrees that BIPAP made his WOB easier. Leaning forward, diaphoretic, cannot speak more that 2 words at a time, and accessory muscles used while on [place].

## 2018-11-29 NOTE — Progress Notes (Signed)
Pt transported to CCU from ED while on the bipap.

## 2018-11-29 NOTE — ED Notes (Signed)
Heparin verified at bedside with Amy, RN

## 2018-11-29 NOTE — ED Notes (Signed)
Report to Kim/Sherrie, RN  

## 2018-11-29 NOTE — Procedures (Signed)
Endotracheal Intubation: Patient required placement of an artificial airway secondary to Respiratory Failure  Consent: Emergent.   Hand washing performed prior to starting the procedure.   Medications administered for sedation prior to procedure:  Rcuronium 40, Etomidate 30.    A time out procedure was called and correct patient, name, & ID confirmed. Needed supplies and equipment were assembled and checked to include ETT, 10 ml syringe, Glidescope, Mac and Miller blades, suction, oxygen and bag mask valve, end tidal CO2 monitor.   Patient was positioned to align the mouth and pharynx to facilitate visualization of the glottis.   Heart rate, SpO2 and blood pressure was continuously monitored during the procedure. Pre-oxygenation was conducted prior to intubation and endotracheal tube was placed through the vocal cords into the trachea.     The artificial airway was placed under direct visualization via glidescope route using a 7.5 ETT on the first attempt.  ETT was secured at 23 cm mark.  Placement was confirmed by auscuitation of lungs with good breath sounds bilaterally and no stomach sounds.  Condensation was noted on endotracheal tube.   Pulse ox 98%.  CO2 detector in place with appropriate color change.   Complications: None .   OperatorLanney Gins MD  Chest radiograph ordered and pending.   Comments: OGT placed via glidescope.   Ottie Glazier, M.D.  [facility]

## 2018-11-29 NOTE — ED Triage Notes (Signed)
To ER via [facility] from group home, a [facility] [employer] c/o SOB. Pt in respiratory distress upon first responder arrival. Given 2 duoneb with no improvement. Placed on CPAP. Arrives to hospital on CPAP. Pt 79% on RA upon initial arrival. EDP and RT at bedside.

## 2018-11-29 NOTE — ED Notes (Addendum)
Dr. Elisabeth Pigeon notified that ordered azilsartan-chlorthalidone 40-12.5mg  is not on pharmacy formulary. Verbal orders received to give patient 25mg  coreg PO now as patient missed dose at group home this AM while being in ER.

## 2018-11-29 NOTE — Procedures (Signed)
Central Venous Catheter Insertion Procedure Note Paul Hernandez [id number] 03/09/1966  Procedure: Insertion of Central Venous Catheter Indications: Assessment of intravascular volume, Drug and/or fluid administration and Frequent blood sampling  Procedure Details Consent: Unable to obtain consent because of emergent medical necessity. Time Out: Verified patient identification, verified procedure, site/side was marked, verified correct patient position, special equipment/implants available, medications/allergies/relevent history reviewed, required imaging and test results available.  Performed  Maximum sterile technique was used including antiseptics, cap, gloves, gown, hand hygiene, mask and sheet. Skin prep: Chlorhexidine; local anesthetic administered A antimicrobial bonded/coated triple lumen catheter was placed in the right internal jugular vein using the Seldinger technique.  Evaluation Blood flow good Complications: No apparent complications Patient did tolerate procedure well. Chest X-ray ordered to verify placement.  CXR: normal.  Procedure performed under direct supervision of Dr.Aleskerov. Ultrasound utilized for realtime vessel cannulation  Paul Hernandez 11/29/2018, 9:13 PM

## 2018-11-29 NOTE — H&P (Signed)
[employer] [facility]   PATIENT NAME: Paul Hernandez    MR#:  [id number]  DATE OF BIRTH:  03-Nov-1965  DATE OF ADMISSION:  11/29/2018  PRIMARY CARE PHYSICIAN: Rexene Agent, MD   REQUESTING/REFERRING PHYSICIAN: Mayford Knife  CHIEF COMPLAINT:   Chief Complaint  Patient presents with  . Respiratory Distress    HISTORY OF PRESENT ILLNESS: Paul Hernandez  is a 53 y.o. male with a known history of CHF, COPD, diabetes, hypertension, mental health disorder-lives in a group home and has 25 to 30% ejection fraction at baseline. Today he had shortness of breath and cough with flulike symptoms are sent to emergency room. Due to his baseline mental health disorder he is not able to give detailed history but he denies any chest pain associated with this. In ER he was noted to have acute respiratory failure with hypoxia due to influenza, started on BiPAP and oxygen saturation was stable on BiPAP. His troponin was also noted to be slightly elevated but his chest x-ray was clear and no EKG changes.  ER physician started on heparin IV drip.  PAST MEDICAL HISTORY:   Past Medical History:  Diagnosis Date  . CHF (congestive heart failure) (HCC)   . COPD (chronic obstructive pulmonary disease) (HCC)   . Diabetes mellitus without complication (HCC)   . Hypertension   . Mental health disorder     PAST SURGICAL HISTORY:  Past Surgical History:  Procedure Laterality Date  . NO PAST SURGERIES      SOCIAL HISTORY:  Social History   Tobacco Use  . Smoking status: Current Some Day Smoker    Packs/day: 0.50  . Smokeless tobacco: Never Used  Substance Use Topics  . Alcohol use: No    FAMILY HISTORY:  Family History  Problem Relation Age of Onset  . CVA Mother   . CAD Father     DRUG ALLERGIES:  Allergies  Allergen Reactions  . Asa [Aspirin]     REVIEW OF SYSTEMS:   CONSTITUTIONAL: No fever, fatigue or weakness.  EYES: No blurred or double vision.  EARS,  NOSE, AND THROAT: No tinnitus or ear pain.  RESPIRATORY: Have cough, shortness of breath, wheezing, no hemoptysis.  CARDIOVASCULAR: No chest pain, orthopnea, edema.  GASTROINTESTINAL: No nausea, vomiting, diarrhea or abdominal pain.  GENITOURINARY: No dysuria, hematuria.  ENDOCRINE: No polyuria, nocturia,  HEMATOLOGY: No anemia, easy bruising or bleeding SKIN: No rash or lesion. MUSCULOSKELETAL: No joint pain or arthritis.   NEUROLOGIC: No tingling, numbness, weakness.  PSYCHIATRY: No anxiety or depression.   MEDICATIONS AT HOME:  Prior to Admission medications   Medication Sig Start Date End Date Taking? Authorizing Provider  albuterol (PROVENTIL HFA;VENTOLIN HFA) 108 (90 Base) MCG/ACT inhaler Inhale 2 puffs into the lungs every 6 (six) hours as needed for wheezing or shortness of breath. Patient taking differently: Inhale 1-2 puffs into the lungs every 4 (four) hours as needed for wheezing or shortness of breath.  08/13/17  Yes Summers, Rhonda L, PA-C  albuterol (PROVENTIL) (2.5 MG/3ML) 0.083% nebulizer solution Take 2.5 mg by nebulization 4 (four) times daily as needed for wheezing or shortness of breath.    Yes [provider]  amLODipine (NORVASC) 5 MG tablet Take 10 mg by mouth daily.   Yes [provider]  Azilsartan-Chlorthalidone (EDARBYCLOR) 40-12.5 MG TABS Take 1 tablet by mouth daily.   Yes [provider]  benztropine (COGENTIN) 2 MG tablet Take 2 mg by mouth at bedtime.   Yes  [provider]  carvedilol (COREG) 25 MG tablet Take 1 tablet (25 mg total) by mouth 2 (two) times daily with a meal. 11/16/17  Yes Enedina Finner, MD  Cholecalciferol (VITAMIN D3) 5000 units CAPS Take 5,000 Units by mouth daily.   Yes [provider]  dapagliflozin propanediol (FARXIGA) 5 MG TABS tablet Take 5 mg by mouth daily.   Yes [provider]  diphenhydrAMINE (BENADRYL) 25 mg capsule Take 25 mg by mouth at bedtime.    Yes [provider]   ezetimibe (ZETIA) 10 MG tablet Take 10 mg by mouth daily.   Yes [provider]  fluticasone (FLONASE) 50 MCG/ACT nasal spray Place 1 spray into both nostrils daily.   Yes [provider]  guaifenesin (ROBITUSSIN) 100 MG/5ML syrup Take 200 mg by mouth every 4 (four) hours as needed for cough.   Yes [provider]  haloperidol decanoate (HALDOL DECANOATE) 100 MG/ML injection Inject 90 mg into the muscle every 28 (twenty-eight) days.    Yes [provider]  isosorbide-hydrALAZINE (BIDIL) 20-37.5 MG tablet Take 1 tablet by mouth 3 (three) times daily.   Yes [provider]  metFORMIN (GLUCOPHAGE) 500 MG tablet Take 1,500 mg by mouth daily with breakfast.    Yes [provider]  metFORMIN (GLUCOPHAGE) 500 MG tablet Take 1,000 mg by mouth every evening.   Yes [provider]  rosuvastatin (CRESTOR) 40 MG tablet Take 40 mg by mouth daily.   Yes [provider]  umeclidinium-vilanterol (ANORO ELLIPTA) 62.5-25 MCG/INH AEPB Inhale 1 puff into the lungs daily.   Yes [provider]  aspirin EC 325 MG tablet Take 1 tablet (325 mg total) by mouth daily. 11/16/17   Enedina Finner, MD  benazepril (LOTENSIN) 20 MG tablet Take 1 tablet (20 mg total) by mouth daily. 11/16/17   Enedina Finner, MD  dextrose (GLUTOSE) 40 % GEL Take by mouth once as needed for low blood sugar. For blood sugar <70    [provider]  triamterene-hydrochlorothiazide (MAXZIDE) 75-50 MG tablet Take 1 tablet by mouth every morning.     [provider]      PHYSICAL EXAMINATION:   VITAL SIGNS: Blood pressure (!) 150/103, pulse (!) 109, temperature 97.9 F (36.6 C), temperature source Axillary, resp. rate (!) 25, height 6' (1.829 m), weight 99.8 kg, SpO2 95 %.  GENERAL:  53 y.o.-year-old patient lying in the bed with no acute distress.  EYES: Pupils equal, round, reactive to light and accommodation. No scleral icterus. Extraocular muscles intact.   HEENT: Head atraumatic, normocephalic. Oropharynx and nasopharynx clear.  NECK:  Supple, no jugular venous distention. No thyroid enlargement, no tenderness.  LUNGS: Normal breath sounds bilaterally, minimal wheezing, some crepitation. No use of accessory muscles of respiration.  BiPAP in use. CARDIOVASCULAR: S1, S2 normal. No murmurs, rubs, or gallops.  ABDOMEN: Soft, nontender, nondistended. Bowel sounds present. No organomegaly or mass.  EXTREMITIES: No pedal edema, cyanosis, or clubbing.  NEUROLOGIC: Cranial nerves II through XII are intact. Muscle strength 5/5 in all extremities. Sensation intact. Gait not checked.  PSYCHIATRIC: The patient is alert and oriented x 3.  SKIN: No obvious rash, lesion, or ulcer.   LABORATORY PANEL:   CBC Recent Labs  Lab 11/29/18 0739  WBC 9.6  HGB 14.7  HCT 46.5  PLT 308  MCV 91.7  MCH 29.0  MCHC 31.6  RDW 13.9  LYMPHSABS 0.2*  MONOABS 0.5  EOSABS 0.0  BASOSABS 0.0   ------------------------------------------------------------------------------------------------------------------  Chemistries  Recent Labs  Lab 11/29/18 0739  NA 137  K 4.4  CL 101  CO2 27  GLUCOSE 249*  BUN 25*  CREATININE 1.44*  CALCIUM 8.1*   ------------------------------------------------------------------------------------------------------------------ estimated creatinine clearance is 74.3 mL/min (A) (by C-G formula based on SCr of 1.44 mg/dL (H)). ------------------------------------------------------------------------------------------------------------------ No results for input(s): TSH, T4TOTAL, T3FREE, THYROIDAB in the last 72 hours.  Invalid input(s): FREET3   Coagulation profile Recent Labs  Lab 11/29/18 0739  INR 1.25   ------------------------------------------------------------------------------------------------------------------- No results for input(s): DDIMER in the last 72  hours. -------------------------------------------------------------------------------------------------------------------  Cardiac Enzymes Recent Labs  Lab 11/29/18 0739 11/29/18 1019  TROPONINI 0.60* 0.74*   ------------------------------------------------------------------------------------------------------------------ Invalid input(s): POCBNP  ---------------------------------------------------------------------------------------------------------------  Urinalysis    Component Value Date/Time   COLORURINE YELLOW (A) 11/16/2017 0009   APPEARANCEUR CLEAR (A) 11/16/2017 0009   LABSPEC 1.034 (H) 11/16/2017 0009   PHURINE 6.0 11/16/2017 0009   GLUCOSEU NEGATIVE 11/16/2017 0009   HGBUR NEGATIVE 11/16/2017 0009   BILIRUBINUR NEGATIVE 11/16/2017 0009   KETONESUR NEGATIVE 11/16/2017 0009   PROTEINUR NEGATIVE 11/16/2017 0009   NITRITE NEGATIVE 11/16/2017 0009   LEUKOCYTESUR NEGATIVE 11/16/2017 0009     RADIOLOGY: Dg Chest Port 1 View  Result Date: 11/29/2018 CLINICAL DATA:  Shortness of breath EXAM: PORTABLE CHEST 1 VIEW COMPARISON:  October 25, 2017 FINDINGS: No edema or consolidation. Heart is mildly enlarged with pulmonary vascularity normal. No adenopathy. No bone lesions. IMPRESSION: No edema or consolidation.  Mild cardiac enlargement. Electronically Signed   By: Bretta Bang III M.D.   On: 11/29/2018 07:51    EKG: Orders placed or performed during the hospital encounter of 11/29/18  . ED EKG  . ED EKG  . EKG 12-Lead  . EKG 12-Lead  . EKG 12-Lead  . EKG 12-Lead  . ED EKG  . ED EKG    IMPRESSION AND PLAN:  *Acute respiratory failure with hypoxia COPD exacerbation Secondary to influenza A  Continue BiPAP use and monitor in stepdown unit. I spoke to intensivist about his further management. IV steroid, inhaled steroid, inhaled nebulizers. No need for antibiotics as chest x-ray was clear. Tamiflu for influenza.  *Elevated troponin-patient denies any  chest pain.  He has some sinus tachycardia on EKG due to respiratory failure but no other changes. I do not think this is ACS. Started on heparin drip by ER physician. I will call cardiology consult for help.  Follow serial troponins. Continue aspirin and carvedilol.  *Hypertension Continue home meds for now.  *Acute renal insufficiency Renal function slightly altered compared to previous, continue to monitor. I will not give IV fluids currently because of significantly low ejection fraction.  *Chronic systolic congestive heart failure Chest x-ray is clear and does not have symptoms of CHF exacerbation currently. Ejection fraction 30% as well past records, continue to monitor.  *Diabetes mellitus Hold metformin due to worsening renal function Keep on sliding scale coverage.  *Active smoking Counseled to quit smoking for 4 minutes and offered nicotine patch.  All the records are reviewed and case discussed with ED provider. Management plans discussed with the patient, family and they are in agreement.  CODE STATUS: full. Code Status History    Date Active Date Inactive Code Status Order ID Comments User Context   11/15/2017 2242 11/16/2017 2016 Full Code [id number]  Oralia Manis, MD Inpatient   09/16/2017 1534 09/18/2017 1440 Full Code [id number]  Alford Highland, MD ED       TOTAL TIME TAKING CARE OF THIS PATIENT: 50 critical  care minutes.    Altamese DillingVaibhavkumar Kaleab Frasier M.D on 11/29/2018   Between 7am to 6pm - Pager - [phone]  After 6pm go to [email or web address] - password [email or web address]  [facility]  Office  [phone]  CC: Primary care physician; [name], [name], MD   Note: This dictation was prepared with Dragon dictation along with smaller phrase technology. Any transcriptional errors that result from this process are unintentional.

## 2018-11-29 NOTE — ED Notes (Signed)
Admitting at bedside 

## 2018-11-29 NOTE — Consult Note (Signed)
CRITICAL CARE CONSULTATION NOTE  CC  Acute hypoxic respiratory failure  HPI This is a 53 year old male has a history of hypertension, COPD, diabetes morbid obesity schizophrenia he lives in a group home.  Patient was having URI-like symptoms for approximately 1 week came to the ED was evaluated and found to be influenza A positive.  He was treated with BiPAP and Tamiflu in the ED and was brought to medical ICU on BiPAP.  Patient is able to only give limited history due to fragmented sentences on speech and baseline schizophrenia.      Vitals:   11/29/18 1615 11/29/18 1721  BP:  92/64  Pulse: 80 89  Resp: (!) 26 (!) 25  Temp:  98.5 F (36.9 C)  SpO2: 93% 97%     REVIEW OF SYSTEMS  PATIENT IS UNABLE TO PROVIDE COMPLETE REVIEW OF SYSTEMS DUE TO acute critical ILLNESS   PHYSICAL EXAMINATION:  GENERAL:critically ill appearing, +resp distress HEAD: Normocephalic, atraumatic.  EYES: Pupils equal, round, reactive to light.  No scleral icterus.  MOUTH: Moist mucosal membrane. NECK: Supple. No thyromegaly. No nodules. No JVD.  PULMONARY: +rhonchi at bases CARDIOVASCULAR: S1 and S2. Regular rate and rhythm. No murmurs, rubs, or gallops.  GASTROINTESTINAL: Soft, nontender, -distended. No masses. Positive bowel sounds. No hepatosplenomegaly.  MUSCULOSKELETAL: No swelling, clubbing, or edema.  NEUROLOGIC: Mild distress due to acute illness SKIN:intact,warm,dry   Labs and Imaging:     -I personally reviewed most recent blood work, imaging and microbiology - significant findings today are elevated troponins trending up.  Hyperglycemia, acute kidney injury, BNP over 3000, ABG with acidemia and hypercapnia as well as hypoxia  LAB RESULTS: Recent Labs  Lab 11/29/18 0739  NA 137  K 4.4  CL 101  CO2 27  BUN 25*  CREATININE 1.44*  GLUCOSE 249*   Recent Labs  Lab 11/29/18 0739  HGB 14.7  HCT 46.5  WBC 9.6  PLT 308     IMAGING RESULTS: Dg Chest Port 1 View  Result Date:  11/29/2018 CLINICAL DATA:  Shortness of breath EXAM: PORTABLE CHEST 1 VIEW COMPARISON:  October 25, 2017 FINDINGS: No edema or consolidation. Heart is mildly enlarged with pulmonary vascularity normal. No adenopathy. No bone lesions. IMPRESSION: No edema or consolidation.  Mild cardiac enlargement. Electronically Signed   By: Lowella Grip III M.D.   On: 11/29/2018 07:51     ASSESSMENT AND PLAN SYNOPSIS  -Multidisciplinary rounds held today  Severe Hypoxic and Hypercapnic Respiratory Failure -Secondary to influenza A infection -continueNIV support -continue Bronchodilator Therapy -Wean Fio2 and PEEP as tolerated -will perform SAT/SBT when respiratory parameters are met   CARDIAC FAILURE- -appreciate cardiology recommendations Dr. Ubaldo Glassing -Echo completed, BNP over 3000 -oxygen as needed -follow up cardiac enzymes as indicated ICU monitoring  Renal Failure-most likely due to ATN -follow chem 7 -follow UO -continue Foley Catheter-assess need daily   NEUROLOGY -Schizophrenia at baseline - minimal sedation to achieve a RASS goal: -1 Wake up assessment pending    GI/Nutrition GI PROPHYLAXIS as indicated DIET-->TF's as tolerated Constipation protocol as indicated  ENDO - ICU hypoglycemic\Hyperglycemia protocol -check FSBS per protocol   ELECTROLYTES -follow labs as needed -replace as needed -pharmacy consultation   DVT/GI PRX ordered -SCDs  TRANSFUSIONS AS NEEDED MONITOR FSBS ASSESS the need for LABS as needed   Critical care provider statement:    Critical care time (minutes):  36   Critical care time was exclusive of:  Separately billable procedures and  treating other patients  Critical care was necessary to treat or prevent imminent or  life-threatening deterioration of the following conditions:   Acute hypoxic hypercapnic respiratory failure secondary to influenza A infection, cardiac failure with elevated BNP over 3000, acute kidney injury   Critical  care was time spent personally by me on the following  activities:  Development of treatment plan with patient or surrogate,  discussions with consultants, evaluation of patient's response to  treatment, examination of patient, obtaining history from patient or  surrogate, ordering and performing treatments and interventions, ordering  and review of laboratory studies and re-evaluation of patient's condition   I assumed direction of critical care for this patient from another  provider in my specialty: no     Ottie Glazier, M.D.  [facility] [place]

## 2018-11-29 NOTE — ED Notes (Signed)
Pt urinated himself, pericare performed. Linens changed. Pt in clean gown.

## 2018-11-29 NOTE — ED Notes (Addendum)
Amy, RT at bedside and placed pt on 4L [place] and off of BIPAP. Pt tolerating well at this time. Pt drinking a drink per request.   Pt states that he feels much better.

## 2018-11-29 NOTE — Progress Notes (Signed)
Patient tried on 8 HFNC- per Dr. Karna Christmas- patient did not tolerate well-patient labored breathing in mid to high 30's. patient placed back on bipap.

## 2018-11-30 ENCOUNTER — Inpatient Hospital Stay
Admit: 2018-11-30 | Discharge: 2018-11-30 | Disposition: A | Discharge status: Home / Self Care | Payer: Medicare Other | Attending: Internal Medicine | Admitting: Internal Medicine

## 2018-11-30 ENCOUNTER — Inpatient Hospital Stay: Payer: Medicare Other

## 2018-11-30 LAB — URINE DRUG SCREEN, QUALITATIVE (ARMC ONLY)
Amphetamines, Ur Screen: NOT DETECTED
Barbiturates, Ur Screen: NOT DETECTED
Benzodiazepine, Ur Scrn: POSITIVE — AB
Cannabinoid 50 Ng, Ur ~~LOC~~: NOT DETECTED
Cocaine Metabolite,Ur ~~LOC~~: NOT DETECTED
MDMA (Ecstasy)Ur Screen: NOT DETECTED
Methadone Scn, Ur: NOT DETECTED
Opiate, Ur Screen: NOT DETECTED
PHENCYCLIDINE (PCP) UR S: NOT DETECTED
Tricyclic, Ur Screen: NOT DETECTED

## 2018-11-30 LAB — HEPATIC FUNCTION PANEL
ALK PHOS: 47 U/L (ref 38–126)
ALT: 301 U/L — AB (ref 0–44)
AST: 446 U/L — ABNORMAL HIGH (ref 15–41)
Albumin: 2.7 g/dL — ABNORMAL LOW (ref 3.5–5.0)
Bilirubin, Direct: 0.3 mg/dL — ABNORMAL HIGH (ref 0.0–0.2)
Indirect Bilirubin: 0.6 mg/dL (ref 0.3–0.9)
Total Bilirubin: 0.9 mg/dL (ref 0.3–1.2)
Total Protein: 5 g/dL — ABNORMAL LOW (ref 6.5–8.1)

## 2018-11-30 LAB — BASIC METABOLIC PANEL
Anion gap: 7 (ref 5–15)
Anion gap: 5 (ref 5–15)
BUN: 38 mg/dL — ABNORMAL HIGH (ref 6–20)
BUN: 43 mg/dL — AB (ref 6–20)
CALCIUM: 7.4 mg/dL — AB (ref 8.9–10.3)
CO2: 29 mmol/L (ref 22–32)
CO2: 31 mmol/L (ref 22–32)
Calcium: 7.2 mg/dL — ABNORMAL LOW (ref 8.9–10.3)
Chloride: 107 mmol/L (ref 98–111)
Chloride: 106 mmol/L (ref 98–111)
Creatinine, Ser: 1.59 mg/dL — ABNORMAL HIGH (ref 0.61–1.24)
Creatinine, Ser: 1.56 mg/dL — ABNORMAL HIGH (ref 0.61–1.24)
GFR calc Af Amer: 57 mL/min — ABNORMAL LOW (ref >60)
GFR calc Af Amer: 59 mL/min — ABNORMAL LOW (ref >60)
GFR calc non Af Amer: 50 mL/min — ABNORMAL LOW (ref >60)
GFR calc non Af Amer: 51 mL/min — ABNORMAL LOW (ref >60)
GLUCOSE: 175 mg/dL — AB (ref 70–99)
GLUCOSE: 185 mg/dL — AB (ref 70–99)
Potassium: 3.9 mmol/L (ref 3.5–5.1)
Potassium: 4.2 mmol/L (ref 3.5–5.1)
Sodium: 143 mmol/L (ref 135–145)
Sodium: 142 mmol/L (ref 135–145)

## 2018-11-30 LAB — CBC
HCT: 42 % (ref 39.0–52.0)
Hemoglobin: 13.1 g/dL (ref 13.0–17.0)
MCH: 28.9 pg (ref 26.0–34.0)
MCHC: 31.2 g/dL (ref 30.0–36.0)
MCV: 92.7 fL (ref 80.0–100.0)
Platelets: 256 10*3/uL (ref 150–400)
RBC: 4.53 MIL/uL (ref 4.22–5.81)
RDW: 14.2 % (ref 11.5–15.5)
WBC: 13.9 10*3/uL — ABNORMAL HIGH (ref 4.0–10.5)
nRBC: 0 % (ref 0.0–0.2)

## 2018-11-30 LAB — PROCALCITONIN
Procalcitonin: 1.51 ng/mL
Procalcitonin: 0.84 ng/mL

## 2018-11-30 LAB — GLUCOSE, CAPILLARY
GLUCOSE-CAPILLARY: 183 mg/dL — AB (ref 70–99)
Glucose-Capillary: 155 mg/dL — ABNORMAL HIGH (ref 70–99)
Glucose-Capillary: 160 mg/dL — ABNORMAL HIGH (ref 70–99)
Glucose-Capillary: 145 mg/dL — ABNORMAL HIGH (ref 70–99)

## 2018-11-30 LAB — LACTIC ACID, PLASMA: Lactic Acid, Venous: 1 mmol/L (ref 0.5–1.9)

## 2018-11-30 LAB — HEPARIN LEVEL (UNFRACTIONATED)
Heparin Unfractionated: 0.32 IU/mL (ref 0.30–0.70)
Heparin Unfractionated: 0.53 IU/mL (ref 0.30–0.70)

## 2018-11-30 LAB — MAGNESIUM: Magnesium: 2.3 mg/dL (ref 1.7–2.4)

## 2018-11-30 LAB — PHOSPHORUS: Phosphorus: 4.3 mg/dL (ref 2.5–4.6)

## 2018-11-30 LAB — HIV ANTIBODY (ROUTINE TESTING W REFLEX): HIV Screen 4th Generation wRfx: NONREACTIVE

## 2018-11-30 MED ORDER — ORAL CARE MOUTH RINSE
15.0000 mL | Freq: Two times a day (BID) | OROMUCOSAL | Status: DC
  Administered 2018-11-30 – 2018-12-01 (×2): 15 mL via OROMUCOSAL

## 2018-11-30 MED ORDER — DEXMEDETOMIDINE HCL IN NACL 400 MCG/100ML IV SOLN
0.4000 ug/kg/h | INTRAVENOUS | Status: DC
  Administered 2018-11-30: 0.4 ug/kg/h via INTRAVENOUS
  Administered 2018-12-01: 0.5 ug/kg/h via INTRAVENOUS
  Filled 2018-11-30 (×2): qty 100

## 2018-11-30 MED ORDER — DEXAMETHASONE SODIUM PHOSPHATE 10 MG/ML IJ SOLN
10.0000 mg | Freq: Once | INTRAMUSCULAR | Status: AC
  Administered 2018-11-30: 10 mg via INTRAVENOUS
  Filled 2018-11-30: qty 1

## 2018-11-30 MED ORDER — VANCOMYCIN HCL 10 G IV SOLR
1250.0000 mg | Freq: Two times a day (BID) | INTRAVENOUS | Status: DC
  Administered 2018-11-30 – 2018-12-02 (×4): 1250 mg via INTRAVENOUS
  Filled 2018-11-30 (×5): qty 1250

## 2018-11-30 MED ORDER — PIPERACILLIN-TAZOBACTAM 3.375 G IVPB
3.3750 g | Freq: Three times a day (TID) | INTRAVENOUS | Status: DC
  Administered 2018-11-30 – 2018-12-02 (×7): 3.375 g via INTRAVENOUS
  Filled 2018-11-30 (×7): qty 50

## 2018-11-30 MED ORDER — VANCOMYCIN HCL 10 G IV SOLR
2000.0000 mg | Freq: Once | INTRAVENOUS | Status: AC
  Administered 2018-11-30: 2000 mg via INTRAVENOUS
  Filled 2018-11-30: qty 2000

## 2018-11-30 NOTE — Progress Notes (Signed)
[employer] -  at [facility]                                                                                                                                                                                  Patient Demographics   Paul Hernandez, is a 53 y.o. male, DOB - 1965-12-16, ZOX:[id number]  Admit date - 11/29/2018   Admitting Physician Altamese DillingVaibhavkumar Vachhani, MD  Outpatient Primary MD for the patient is [name], [name], MD   LOS - 1  Subjective: Pt remains intubated    Review of Systems:   CONSTITUTIONAL: intubated  Vitals:   Vitals:   11/30/18 1100 11/30/18 1200 11/30/18 1207 11/30/18 1300  BP: (!) 86/63 101/79  112/83  Pulse: 86 85  91  Resp: (!) 25 (!) 25  (!) 25  Temp:  97.7 F (36.5 C)    TempSrc:      SpO2: 97% 96% 95% 95%  Weight:      Height:        Wt Readings from Last 3 Encounters:  11/29/18 99.8 kg  10/08/18 106.6 kg  11/29/17 101.6 kg     Intake/Output Summary (Last 24 hours) at 11/30/2018 1447 Last data filed at 11/30/2018 1200 Gross per 24 hour  Intake 4418.92 ml  Output 1165 ml  Net 3253.92 ml    Physical Exam:   GENERAL: Critically ill-appearing HEAD, EYES, EARS, NOSE AND THROAT: Atraumatic, normocephalic. Extraocular muscles are intact. Pupils equal and reactive to light. Sclerae anicteric. No conjunctival injection. No oro-pharyngeal erythema.  NECK: Supple. There is no jugular venous distention. No bruits, no lymphadenopathy, no thyromegaly.  HEART: Regular rate and rhythm,. No murmurs, no rubs, no clicks.  LUNGS: Clear to auscultation bilaterally. No rales or rhonchi. No wheezes.  ABDOMEN: Soft, flat, nontender, nondistended. Has good bowel sounds. No hepatosplenomegaly appreciated.  EXTREMITIES: No evidence of any cyanosis, clubbing, or peripheral edema.  +2 pedal and radial pulses bilaterally.  NEUROLOGIC: The patient is alert, awake, and oriented x3 with no focal motor or sensory deficits appreciated  bilaterally.  SKIN: Moist and warm with no rashes appreciated.  Psych: Not anxious, depressed LN: No inguinal LN enlargement    Antibiotics   Anti-infectives (From admission, onward)   Start     Dose/Rate Route Frequency Ordered Stop   11/30/18 2030  vancomycin (VANCOCIN) 1,250 mg in sodium chloride 0.9 % 250 mL IVPB     1,250 mg 166.7 mL/hr over 90 Minutes Intravenous Every 12 hours 11/30/18 1005     11/30/18 0800  piperacillin-tazobactam (ZOSYN) IVPB 3.375 g     3.375 g 12.5 mL/hr over 240 Minutes Intravenous Every 8 hours 11/30/18 0742  11/30/18 0800  vancomycin (VANCOCIN) 2,000 mg in sodium chloride 0.9 % 500 mL IVPB     2,000 mg 250 mL/hr over 120 Minutes Intravenous  Once 11/30/18 0744 11/30/18 1045   11/29/18 1130  oseltamivir (TAMIFLU) capsule 75 mg     75 mg Oral 2 times daily 11/29/18 1100 12/04/18 0959   11/29/18 0845  oseltamivir (TAMIFLU) capsule 75 mg     75 mg Oral  Once 11/29/18 0835 11/29/18 0843      Medications   Scheduled Meds: . aspirin EC  325 mg Oral Daily  . benztropine  2 mg Oral QHS  . budesonide (PULMICORT) nebulizer solution  0.5 mg Nebulization BID  . carvedilol  25 mg Oral BID WC  . diphenhydrAMINE  25 mg Oral QHS  . ezetimibe  10 mg Oral Daily  . fluticasone  1 spray Each Nare Daily  . insulin aspart  0-5 Units Subcutaneous QHS  . insulin aspart  0-9 Units Subcutaneous TID WC  . ipratropium  0.5 mg Nebulization Q4H  . ipratropium-albuterol  3 mL Nebulization Q4H  . isosorbide-hydrALAZINE  1 tablet Oral TID  . losartan  50 mg Oral Daily  . mouth rinse  15 mL Mouth Rinse BID  . methylPREDNISolone (SOLU-MEDROL) injection  60 mg Intravenous Q6H  . oseltamivir  75 mg Oral BID  . pantoprazole (PROTONIX) IV  40 mg Intravenous Daily  . rosuvastatin  40 mg Oral Daily  . sodium chloride flush  10-40 mL Intracatheter Q12H  . tiotropium  18 mcg Inhalation Daily   Continuous Infusions: . sodium chloride Stopped (11/30/18 0045)  . DOPamine 5  mcg/kg/min (11/30/18 1200)  . fentaNYL infusion INTRAVENOUS 125 mcg/hr (11/30/18 1200)  . heparin 1,200 Units/hr (11/30/18 1117)  . norepinephrine (LEVOPHED) Adult infusion Stopped (11/30/18 0445)  . piperacillin-tazobactam (ZOSYN)  IV 12.5 mL/hr at 11/30/18 1200  . propofol (DIPRIVAN) infusion Stopped (11/29/18 2000)  . vancomycin     PRN Meds:.sodium chloride, bisacodyl, docusate sodium, midazolam, midazolam, nitroGLYCERIN, sennosides, sodium chloride flush   Data Review:   Micro Results Recent Results (from the past 240 hour(s))  MRSA PCR Screening     Status: None   Collection Time: 11/29/18  5:33 PM  Result Value Ref Range Status   MRSA by PCR NEGATIVE NEGATIVE Final    Comment:        The GeneXpert MRSA Assay (FDA approved for NASAL specimens only), is one component of a comprehensive MRSA colonization surveillance program. It is not intended to diagnose MRSA infection nor to guide or monitor treatment for MRSA infections. Performed at [facility], [address] [place], [place] [address]     Radiology Reports Dg Abd 1 View  Result Date: 11/29/2018 CLINICAL DATA:  Orogastric tube placement. EXAM: ABDOMEN - 1 VIEW COMPARISON:  Abdomen and pelvis CT dated 04/12/2011. FINDINGS: Orogastric tube extending into the stomach with its tip and side hole in the proximal stomach. Gas and stool in normal caliber colon. Clear lung bases. Enlarged heart. Unremarkable bones. IMPRESSION: 1. Orogastric tube tip and side hole in the proximal stomach. 2. Cardiomegaly. Electronically Signed   By: Beckie SaltsSteven  Reid M.D.   On: 11/29/2018 21:46   Ct Head Wo Contrast  Result Date: 11/30/2018 CLINICAL DATA:  Encephalopathy.  Altered level of consciousness. EXAM: CT HEAD WITHOUT CONTRAST TECHNIQUE: Contiguous axial images were obtained from the base of the skull through the vertex without intravenous contrast. COMPARISON:  MRI 11/16/2017 FINDINGS: Brain: Mild cerebral atrophy. No acute  intracranial abnormality. Specifically, no hemorrhage, hydrocephalus, mass lesion, acute infarction, or significant intracranial injury. Vascular: No hyperdense vessel or unexpected calcification. Skull: No acute calvarial abnormality. Sinuses/Orbits: Visualized paranasal sinuses and mastoids clear. Orbital soft tissues unremarkable. Other: None IMPRESSION: Mild cerebral atrophy.  No acute intracranial abnormality. Electronically Signed   By: Charlett Nose M.D.   On: 11/30/2018 01:41   Portable Chest Xray  Result Date: 11/30/2018 CLINICAL DATA:  Respiratory failure EXAM: PORTABLE CHEST 1 VIEW COMPARISON:  11/29/2018 FINDINGS: Endotracheal tube is in place, tip 5.6 centimeters above the carina. A RIGHT IJ central line tip overlies the superior vena cava. A nasogastric tube is in place, tip beyond the gastroesophageal junction and beyond the edge of the image. Heart size is accentuated by technique and probably mildly enlarged. There is subsegmental atelectasis or early infiltrate at the RIGHT lung base. IMPRESSION: 1. Lines and tubes as described. 2. Minimal new RIGHT lower lobe atelectasis or infiltrate. Electronically Signed   By: Norva Pavlov M.D.   On: 11/30/2018 08:14   Dg Chest Port 1 View  Result Date: 11/29/2018 CLINICAL DATA:  Intubated. Central line placement. EXAM: PORTABLE CHEST 1 VIEW COMPARISON:  Earlier today. FINDINGS: The endotracheal tube remains in satisfactory position. Interval right jugular catheter with its tip in the superior vena cava. No pneumothorax. Stable enlarged cardiac silhouette. Clear lungs with normal vascularity. Unremarkable bones. IMPRESSION: 1. Right jugular catheter tip in the superior vena cava without pneumothorax. 2. Stable cardiomegaly. Electronically Signed   By: Beckie Salts M.D.   On: 11/29/2018 21:47   Portable Chest X-ray  Result Date: 11/29/2018 CLINICAL DATA:  Status post intubation. EXAM: PORTABLE CHEST 1 VIEW COMPARISON:  11/29/2018. FINDINGS: Interval  endotracheal tube in satisfactory position. Stable mildly enlarged cardiac silhouette. Clear lungs with normal vascularity. Mild peribronchial thickening. Unremarkable bones. IMPRESSION: 1. Endotracheal tube in satisfactory position. 2. Stable mild cardiomegaly with interval mild bronchitic changes. Electronically Signed   By: Beckie Salts M.D.   On: 11/29/2018 20:17   Dg Chest Port 1 View  Result Date: 11/29/2018 CLINICAL DATA:  Shortness of breath EXAM: PORTABLE CHEST 1 VIEW COMPARISON:  October 25, 2017 FINDINGS: No edema or consolidation. Heart is mildly enlarged with pulmonary vascularity normal. No adenopathy. No bone lesions. IMPRESSION: No edema or consolidation.  Mild cardiac enlargement. Electronically Signed   By: Bretta Bang III M.D.   On: 11/29/2018 07:51     CBC Recent Labs  Lab 11/29/18 0739 11/29/18 1741 11/29/18 2051 11/30/18 0514  WBC 9.6 7.6 6.0 13.9*  HGB 14.7 13.7 13.3 13.1  HCT 46.5 44.2 43.8 42.0  PLT 308 246 269 256  MCV 91.7 94.6 96.7 92.7  MCH 29.0 29.3 29.4 28.9  MCHC 31.6 31.0 30.4 31.2  RDW 13.9 14.0 14.2 14.2  LYMPHSABS 0.2*  --   --   --   MONOABS 0.5  --   --   --   EOSABS 0.0  --   --   --   BASOSABS 0.0  --   --   --     Chemistries  Recent Labs  Lab 11/29/18 0739 11/29/18 1741 11/29/18 2051 11/30/18 0514 11/30/18 0912  NA 137  --  142 143  --   K 4.4  --  5.9* 3.9  --   CL 101  --  104 107  --   CO2 27  --  32 29  --   GLUCOSE 249*  --  105* 175*  --  BUN 25*  --  37* 38*  --   CREATININE 1.44* 1.59* 2.11* 1.59*  --   CALCIUM 8.1*  --  7.7* 7.4*  --   MG  --   --  2.5*  --   --   AST  --   --  222*  --  446*  ALT  --   --  154*  --  301*  ALKPHOS  --   --  49  --  47  BILITOT  --   --  0.9  --  0.9   ------------------------------------------------------------------------------------------------------------------ estimated creatinine clearance is 67.2 mL/min (A) (by C-G formula based on SCr of 1.59 mg/dL  (H)). ------------------------------------------------------------------------------------------------------------------ Recent Labs    11/29/18 1053  HGBA1C 8.3*   ------------------------------------------------------------------------------------------------------------------ Recent Labs    11/29/18 0739 11/29/18 2051  CHOL 210*  --   HDL 47  --   LDLCALC 149*  --   TRIG 68 37  CHOLHDL 4.5  --    ------------------------------------------------------------------------------------------------------------------ No results for input(s): TSH, T4TOTAL, T3FREE, THYROIDAB in the last 72 hours.  Invalid input(s): FREET3 ------------------------------------------------------------------------------------------------------------------ No results for input(s): VITAMINB12, FOLATE, FERRITIN, TIBC, IRON, RETICCTPCT in the last 72 hours.  Coagulation profile Recent Labs  Lab 11/29/18 0739 11/29/18 2051  INR 1.25 1.30    No results for input(s): DDIMER in the last 72 hours.  Cardiac Enzymes Recent Labs  Lab 11/29/18 1019 11/29/18 1741 11/29/18 2051  TROPONINI 0.74* 1.42* 1.62*   ------------------------------------------------------------------------------------------------------------------ Invalid input(s): POCBNP    Assessment & Plan   *Acute respiratory failure with hypoxia COPD exacerbation Secondary to influenza A Continue ventilator   *Elevated troponin-patient denies any chest pain.  He has some sinus tachycardia on EKG due to respiratory failure but no other changes. Seen by cardiology no ACS Started on heparin drip by ER physician. I will call cardiology consult for help.  Follow serial troponins. Continue aspirin and carvedilol.  *Hypertension Continue home meds for now.  *Acute renal insufficiency Renal function slightly altered compared to previous, continue to monitor. I will not give IV fluids currently because of significantly low ejection  fraction.  *Chronic systolic congestive heart failure Chest x-ray is clear and does not have symptoms of CHF exacerbation currently. Ejection fraction 30% as well past records, continue to monitor.  *Diabetes mellitus Hold metformin due to worsening renal function Keep on sliding scale coverage.  *Active smoking Counseled to quit smoking for 4 minutes and offered nicotine patch.     Code Status Orders  (From admission, onward)         Start     Ordered   11/29/18 1256  Full code  Continuous     11/29/18 1255        Code Status History    Date Active Date Inactive Code Status Order ID Comments User Context   11/15/2017 2242 11/16/2017 2016 Full Code [id number]  Oralia Manis, MD Inpatient   09/16/2017 1534 09/18/2017 1440 Full Code [id number]  Alford Highland, MD ED           Consults none  DVT Prophylaxis Heparin Lab Results  Component Value Date   PLT 256 11/30/2018     Time Spent in minutes   Greater than 50% of time spent in care coordination and counseling patient regarding the condition and plan of care.   Auburn Bilberry M.D on 11/30/2018 at 2:47 PM  Between 7am to 6pm - Pager - [phone]  After 6pm go to [email or web address] - password [email or web address]  [employer]  [phone]

## 2018-11-30 NOTE — [place] FL2 (Signed)
[place] MEDICAID FL2 LEVEL OF CARE SCREENING TOOL     IDENTIFICATION  Patient Name: Paul Hernandez Birthdate: [birth year] Sex: male Admission Date (Current Location): 11/29/2018  [place] and [place] Number:  [address] [phone] [facility] Facility and Address:  [facility], [address] [place], [place] [address]      Provider Number: [id number]  Attending Physician Name and Address:  Auburn Bilberry, MD  Relative Name and Phone Number:  Jodi Geralds [facility]) [phone]; Azzie Roup [phone]; Raenette Rover [facility]) [phone]    Current Level of Care: Hospital Recommended Level of Care: Family Care Home Prior Approval Number:    Date Approved/Denied:   PASRR Number:    Discharge Plan: Domiciliary (Rest home)([facility])    Current Diagnoses: Patient Active Problem List   Diagnosis Date Noted  . Acute respiratory failure with hypoxia (HCC) 11/29/2018  . Influenza A 11/29/2018  . Hyperlipidemia, unspecified 11/29/2017  . Hypertension 11/29/2017  . Schizophrenia (HCC) 11/16/2017  . COPD (chronic obstructive pulmonary disease) (HCC) 11/15/2017  . Mental health disorder 11/15/2017  . Cerebral ischemia 11/15/2017  . Cerebral infarct (HCC) 11/15/2017  . Benign essential HTN 11/13/2017  . Diabetes type 2, controlled (HCC) 11/13/2017  . COPD exacerbation (HCC) 09/16/2017    Orientation RESPIRATION BLADDER Height & Weight     Self, Time, Situation, Place    Continent Weight: 220 lb (99.8 kg) Height:  6' (182.9 cm)  BEHAVIORAL SYMPTOMS/MOOD NEUROLOGICAL BOWEL NUTRITION STATUS      Continent Diet(NPO to be advanced. Please see discharge summary.)  AMBULATORY STATUS COMMUNICATION OF NEEDS Skin   Independent Verbally Normal                       Personal Care Assistance Level of Assistance  Bathing, Feeding, Dressing Bathing Assistance: Independent Feeding assistance: Independent Dressing Assistance:  Independent     Functional Limitations Info  Sight, Hearing, Speech Sight Info: Adequate Hearing Info: Adequate Speech Info: Adequate    SPECIAL CARE FACTORS FREQUENCY                       Contractures Contractures Info: Not present    Additional Factors Info  Code Status, Allergies, Psychotropic Code Status Info: Full Allergies Info: Asa (Aspirin) Psychotropic Info: Haldol         Current Medications (11/30/2018):  This is the current hospital active medication list Current Facility-Administered Medications  Medication Dose Route Frequency Provider Last Rate Last Dose  . 0.9 %  sodium chloride infusion   Intravenous PRN Tukov-Yual, Alroy Bailiff, NP   Stopped at 11/30/18 0045  . aspirin EC tablet 325 mg  325 mg Oral Daily Altamese Dilling, MD   325 mg at 11/30/18 0842  . benztropine (COGENTIN) tablet 2 mg  2 mg Oral QHS Altamese Dilling, MD      . bisacodyl (DULCOLAX) suppository 10 mg  10 mg Rectal Daily PRN Tukov-Yual, Magdalene S, NP      . budesonide (PULMICORT) nebulizer solution 0.5 mg  0.5 mg Nebulization BID Altamese Dilling, MD   0.5 mg at 11/30/18 8184  . carvedilol (COREG) tablet 25 mg  25 mg Oral BID WC Altamese Dilling, MD      . dexamethasone (DECADRON) injection 10 mg  10 mg Intravenous Once Vida Rigger, MD      . diphenhydrAMINE (BENADRYL) capsule 25 mg  25 mg Oral QHS Altamese Dilling, MD      . docusate sodium (  COLACE) capsule 100 mg  100 mg Oral BID PRN Altamese DillingVachhani, Vaibhavkumar, MD      . DOPamine (INTROPIN) 800 mg in dextrose 5 % 250 mL (3.2 mg/mL) infusion  0-20 mcg/kg/min Intravenous Titrated Tukov-Yual, Alroy BailiffMagdalene S, NP   Stopped at 11/30/18 1337  . ezetimibe (ZETIA) tablet 10 mg  10 mg Oral Daily Altamese DillingVachhani, Vaibhavkumar, MD   10 mg at 11/30/18 0843  . fentaNYL 2500mcg in NS 250mL (4310mcg/ml) infusion-PREMIX  0-400 mcg/hr Intravenous Continuous Tukov-Yual, Magdalene S, NP   Stopped at 11/30/18 1330  . fluticasone  (FLONASE) 50 MCG/ACT nasal spray 1 spray  1 spray Each Nare Daily Altamese DillingVachhani, Vaibhavkumar, MD   1 spray at 11/29/18 1321  . heparin ADULT infusion 100 units/mL (25000 units/25250mL sodium chloride 0.45%)  1,200 Units/hr Intravenous Continuous Emily FilbertWilliams, Jonathan E, MD 12 mL/hr at 11/30/18 1117 1,200 Units/hr at 11/30/18 1117  . insulin aspart (novoLOG) injection 0-5 Units  0-5 Units Subcutaneous QHS Altamese DillingVachhani, Vaibhavkumar, MD      . insulin aspart (novoLOG) injection 0-9 Units  0-9 Units Subcutaneous TID WC Altamese DillingVachhani, Vaibhavkumar, MD   2 Units at 11/30/18 1236  . ipratropium-albuterol (DUONEB) 0.5-2.5 (3) MG/3ML nebulizer solution 3 mL  3 mL Nebulization Q4H Altamese DillingVachhani, Vaibhavkumar, MD   3 mL at 11/30/18 1544  . isosorbide-hydrALAZINE (BIDIL) 20-37.5 MG per tablet 1 tablet  1 tablet Oral TID Altamese DillingVachhani, Vaibhavkumar, MD      . losartan (COZAAR) tablet 50 mg  50 mg Oral Daily Altamese DillingVachhani, Vaibhavkumar, MD   50 mg at 11/29/18 1443  . MEDLINE mouth rinse  15 mL Mouth Rinse BID Vida RiggerAleskerov, Fuad, MD      . methylPREDNISolone sodium succinate (SOLU-MEDROL) 125 mg/2 mL injection 60 mg  60 mg Intravenous Q6H Altamese DillingVachhani, Vaibhavkumar, MD   60 mg at 11/30/18 1115  . midazolam (VERSED) injection 2-4 mg  2-4 mg Intravenous Q15 min PRN Tukov-Yual, Alroy BailiffMagdalene S, NP   2 mg at 11/29/18 2310  . midazolam (VERSED) injection 2-4 mg  2-4 mg Intravenous Q2H PRN Tukov-Yual, Magdalene S, NP      . nitroGLYCERIN (NITROSTAT) SL tablet 0.4 mg  0.4 mg Sublingual Q5 min PRN Altamese DillingVachhani, Vaibhavkumar, MD      . norepinephrine (LEVOPHED) 16 mg in 250mL premix infusion  0-65 mcg/min Intravenous Titrated Tukov-Yual, Alroy BailiffMagdalene S, NP   Stopped at 11/30/18 0445  . oseltamivir (TAMIFLU) capsule 75 mg  75 mg Oral BID Altamese DillingVachhani, Vaibhavkumar, MD   75 mg at 11/30/18 0843  . pantoprazole (PROTONIX) injection 40 mg  40 mg Intravenous Daily Tukov-Yual, Magdalene S, NP   40 mg at 11/30/18 0845  . piperacillin-tazobactam (ZOSYN) IVPB 3.375 g  3.375 g Intravenous  Q8H Tukov-Yual, Magdalene S, NP   Stopped at 11/30/18 1229  . propofol (DIPRIVAN) 1000 MG/100ML infusion  0-50 mcg/kg/min Intravenous Continuous Tukov-Yual, Alroy BailiffMagdalene S, NP   Stopped at 11/29/18 2000  . rosuvastatin (CRESTOR) tablet 40 mg  40 mg Oral Daily Altamese DillingVachhani, Vaibhavkumar, MD   40 mg at 11/30/18 0842  . sennosides (SENOKOT) 8.8 MG/5ML syrup 5 mL  5 mL Per Tube BID PRN Tukov-Yual, Magdalene S, NP      . sodium chloride flush (NS) 0.9 % injection 10-40 mL  10-40 mL Intracatheter Q12H Tukov-Yual, Magdalene S, NP   10 mL at 11/30/18 0907  . sodium chloride flush (NS) 0.9 % injection 10-40 mL  10-40 mL Intracatheter PRN Tukov-Yual, Magdalene S, NP      . tiotropium (SPIRIVA) inhalation capsule ([facility] use ONLY) 18  mcg  18 mcg Inhalation Daily Altamese Dilling, MD   18 mcg at 11/29/18 1319  . vancomycin (VANCOCIN) 1,250 mg in sodium chloride 0.9 % 250 mL IVPB  1,250 mg Intravenous Q12H Tukov-Yual, Magdalene S, NP         Discharge Medications: Please see discharge summary for a list of discharge medications.  Relevant Imaging Results:  Relevant Lab Results:   Additional Information SS# [id number]  Judi Cong, LCSW

## 2018-11-30 NOTE — Progress Notes (Signed)
ANTICOAGULATION CONSULT NOTE   Pharmacy Consult for Heparin Dosing Indication: chest pain/ACS  Allergies  Allergen Reactions  . Asa [Aspirin]     Patient Measurements: Height: 6' (182.9 cm) Weight: 220 lb (99.8 kg) IBW/kg (Calculated) : 77.6 Heparin Dosing Weight: 97.8  Vital Signs: Temp: 99.1 F (37.3 C) (02/08 0349) Temp Source: Oral (02/08 0349) BP: 125/93 (02/08 0700) Pulse Rate: 88 (02/08 0700)  Labs: Recent Labs    11/29/18 0739 11/29/18 1019 11/29/18 1741 11/29/18 2051 11/30/18 0514 11/30/18 0912  HGB 14.7  --  13.7 13.3 13.1  --   HCT 46.5  --  44.2 43.8 42.0  --   PLT 308  --  246 269 256  --   APTT 30  --   --   --   --   --   LABPROT 15.6*  --   --  16.1*  --   --   INR 1.25  --   --  1.30  --   --   HEPARINUNFRC  --   --  0.57  --   --  0.32  CREATININE 1.44*  --  1.59* 2.11* 1.59*  --   TROPONINI 0.60* 0.74* 1.42* 1.62*  --   --     Estimated Creatinine Clearance: 67.2 mL/min (A) (by C-G formula based on SCr of 1.59 mg/dL (H)).   Medical History: Past Medical History:  Diagnosis Date  . CHF (congestive heart failure) (HCC)   . COPD (chronic obstructive pulmonary disease) (HCC)   . Diabetes mellitus without complication (HCC)   . Hypertension   . Mental health disorder      Goal of Therapy:  Heparin level 0.3-0.7 units/ml Monitor platelets by anticoagulation protocol: Yes   Plan:  Give 4000 units bolus x 1 Start heparin infusion at 1200 units/hr   02/07 1741  HL= 0.57. Will continue current drip rate and check confirmatory level in 6 hours.  02/08 @ 0240:  Heparin was d/c'd earlier on 2/7 around 1800 due to possible concern that pt had a CVA .  Pt returned from CT with no evidence of CVA.  Will restart heparin at previous rate @ 02/08 @ 0300.  Will recheck HL 6 hrs after restart.    02/08 ~ 09:00  HL = 0.32.  Continue current dosing. Recheck HL today at 15:00.    Stormy Card, RPH 11/30/2018,10:01 AM

## 2018-11-30 NOTE — Progress Notes (Signed)
Patient Name: Paul Hernandez Date of Encounter: 11/30/2018  Hospital Problem List     Principal Problem:   Acute respiratory failure with hypoxia [facility]) Active Problems:   COPD exacerbation ([place])   Influenza A    Patient Profile     53 year old male with schizoaffective disorder admitted with respiratory failure.  Currently intubated.  Mild serum troponin elevation with peak of 1.62.  Subjective   Intubated and unable to give history  Inpatient Medications    . aspirin EC  325 mg Oral Daily  . benztropine  2 mg Oral QHS  . budesonide (PULMICORT) nebulizer solution  0.5 mg Nebulization BID  . carvedilol  25 mg Oral BID WC  . chlorhexidine gluconate (MEDLINE KIT)  15 mL Mouth Rinse BID  . diphenhydrAMINE  25 mg Oral QHS  . ezetimibe  10 mg Oral Daily  . fluticasone  1 spray Each Nare Daily  . insulin aspart  0-5 Units Subcutaneous QHS  . insulin aspart  0-9 Units Subcutaneous TID WC  . ipratropium  0.5 mg Nebulization Q4H  . ipratropium-albuterol  3 mL Nebulization Q4H  . isosorbide-hydrALAZINE  1 tablet Oral TID  . losartan  50 mg Oral Daily  . mouth rinse  15 mL Mouth Rinse 10 times per day  . methylPREDNISolone (SOLU-MEDROL) injection  60 mg Intravenous Q6H  . oseltamivir  75 mg Oral BID  . pantoprazole (PROTONIX) IV  40 mg Intravenous Daily  . rosuvastatin  40 mg Oral Daily  . sodium chloride flush  10-40 mL Intracatheter Q12H  . tiotropium  18 mcg Inhalation Daily    Vital Signs    Vitals:   11/30/18 1100 11/30/18 1200 11/30/18 1207 11/30/18 1300  BP: (!) 86/63 101/79  112/83  Pulse: 86 85  91  Resp: (!) 25 (!) 25  (!) 25  Temp:  97.7 F (36.5 C)    TempSrc:      SpO2: 97% 96% 95% 95%  Weight:      Height:        Intake/Output Summary (Last 24 hours) at 11/30/2018 1411 Last data filed at 11/30/2018 1200 Gross per 24 hour  Intake 4418.92 ml  Output 1165 ml  Net 3253.92 ml   Filed Weights   11/29/18 0735  Weight: 99.8 kg    Physical Exam    GEN:  Well nourished, well developed, in no acute distress.  HEENT: normal.  Neck: Supple, no JVD, carotid bruits, or masses. Cardiac: RRR, no murmurs, rubs, or gallops. No clubbing, cyanosis, edema.  Radials/DP/PT 2+ and equal bilaterally.  Respiratory:  Respirations regular and unlabored, clear to auscultation bilaterally. GI: Soft, nontender, nondistended, BS + x 4. MS: no deformity or atrophy. Skin: warm and dry, no rash. Neuro:  Strength and sensation are intact. Psych: Normal affect.  Labs    CBC Recent Labs    11/29/18 0739  11/29/18 2051 11/30/18 0514  WBC 9.6   < > 6.0 13.9*  NEUTROABS 8.7*  --   --   --   HGB 14.7   < > 13.3 13.1  HCT 46.5   < > 43.8 42.0  MCV 91.7   < > 96.7 92.7  PLT 308   < > 269 256   < > = values in this interval not displayed.   Basic Metabolic Panel Recent Labs    11/29/18 2051 11/30/18 0514  NA 142 143  K 5.9* 3.9  CL 104 107  CO2 32 29  GLUCOSE 105*  175*  BUN 37* 38*  CREATININE 2.11* 1.59*  CALCIUM 7.7* 7.4*  MG 2.5*  --   PHOS 9.2*  --    Liver Function Tests Recent Labs    11/29/18 2051 11/30/18 0912  AST 222* 446*  ALT 154* 301*  ALKPHOS 49 47  BILITOT 0.9 0.9  PROT 5.4* 5.0*  ALBUMIN 3.1* 2.7*   No results for input(s): LIPASE, AMYLASE in the last 72 hours. Cardiac Enzymes Recent Labs    11/29/18 1019 11/29/18 1741 11/29/18 2051  TROPONINI 0.74* 1.42* 1.62*   BNP Recent Labs    11/29/18 0739  BNP 3,045.0*   D-Dimer No results for input(s): DDIMER in the last 72 hours. Hemoglobin A1C Recent Labs    11/29/18 1053  HGBA1C 8.3*   Fasting Lipid Panel Recent Labs    11/29/18 0739 11/29/18 2051  CHOL 210*  --   HDL 47  --   LDLCALC 149*  --   TRIG 68 37  CHOLHDL 4.5  --    Thyroid Function Tests No results for input(s): TSH, T4TOTAL, T3FREE, THYROIDAB in the last 72 hours.  Invalid input(s): FREET3  Telemetry    Sinus rhythm  ECG    Sinus rhythm with no ischemia  Radiology    Dg Abd 1  View  Result Date: 11/29/2018 CLINICAL DATA:  Orogastric tube placement. EXAM: ABDOMEN - 1 VIEW COMPARISON:  Abdomen and pelvis CT dated 04/12/2011. FINDINGS: Orogastric tube extending into the stomach with its tip and side hole in the proximal stomach. Gas and stool in normal caliber colon. Clear lung bases. Enlarged heart. Unremarkable bones. IMPRESSION: 1. Orogastric tube tip and side hole in the proximal stomach. 2. Cardiomegaly. Electronically Signed   By: Claudie Revering M.D.   On: 11/29/2018 21:46   Ct Head Wo Contrast  Result Date: 11/30/2018 CLINICAL DATA:  Encephalopathy.  Altered level of consciousness. EXAM: CT HEAD WITHOUT CONTRAST TECHNIQUE: Contiguous axial images were obtained from the base of the skull through the vertex without intravenous contrast. COMPARISON:  MRI 11/16/2017 FINDINGS: Brain: Mild cerebral atrophy. No acute intracranial abnormality. Specifically, no hemorrhage, hydrocephalus, mass lesion, acute infarction, or significant intracranial injury. Vascular: No hyperdense vessel or unexpected calcification. Skull: No acute calvarial abnormality. Sinuses/Orbits: Visualized paranasal sinuses and mastoids clear. Orbital soft tissues unremarkable. Other: None IMPRESSION: Mild cerebral atrophy.  No acute intracranial abnormality. Electronically Signed   By: Rolm Baptise M.D.   On: 11/30/2018 01:41   Portable Chest Xray  Result Date: 11/30/2018 CLINICAL DATA:  Respiratory failure EXAM: PORTABLE CHEST 1 VIEW COMPARISON:  11/29/2018 FINDINGS: Endotracheal tube is in place, tip 5.6 centimeters above the carina. A RIGHT IJ central line tip overlies the superior vena cava. A nasogastric tube is in place, tip beyond the gastroesophageal junction and beyond the edge of the image. Heart size is accentuated by technique and probably mildly enlarged. There is subsegmental atelectasis or early infiltrate at the RIGHT lung base. IMPRESSION: 1. Lines and tubes as described. 2. Minimal new RIGHT lower  lobe atelectasis or infiltrate. Electronically Signed   By: Nolon Nations M.D.   On: 11/30/2018 08:14   Dg Chest Port 1 View  Result Date: 11/29/2018 CLINICAL DATA:  Intubated. Central line placement. EXAM: PORTABLE CHEST 1 VIEW COMPARISON:  Earlier today. FINDINGS: The endotracheal tube remains in satisfactory position. Interval right jugular catheter with its tip in the superior vena cava. No pneumothorax. Stable enlarged cardiac silhouette. Clear lungs with normal vascularity. Unremarkable bones. IMPRESSION: 1. Right jugular  catheter tip in the superior vena cava without pneumothorax. 2. Stable cardiomegaly. Electronically Signed   By: Claudie Revering M.D.   On: 11/29/2018 21:47   Portable Chest X-ray  Result Date: 11/29/2018 CLINICAL DATA:  Status post intubation. EXAM: PORTABLE CHEST 1 VIEW COMPARISON:  11/29/2018. FINDINGS: Interval endotracheal tube in satisfactory position. Stable mildly enlarged cardiac silhouette. Clear lungs with normal vascularity. Mild peribronchial thickening. Unremarkable bones. IMPRESSION: 1. Endotracheal tube in satisfactory position. 2. Stable mild cardiomegaly with interval mild bronchitic changes. Electronically Signed   By: Claudie Revering M.D.   On: 11/29/2018 20:17   Dg Chest Port 1 View  Result Date: 11/29/2018 CLINICAL DATA:  Shortness of breath EXAM: PORTABLE CHEST 1 VIEW COMPARISON:  October 25, 2017 FINDINGS: No edema or consolidation. Heart is mildly enlarged with pulmonary vascularity normal. No adenopathy. No bone lesions. IMPRESSION: No edema or consolidation.  Mild cardiac enlargement. Electronically Signed   By: Lowella Grip III M.D.   On: 11/29/2018 07:51    Assessment & Plan    Respiratory failure-etiology unclear.  Likely multifactorial to include possible volume overload as well as reactive airway disease.  Currently intubated.  On empiric antibiotics.  Troponin elevation-etiology may be demand versus acute coronary syndrome.  Likely demand  ischemia however.  Further work-up pending course.  Signed, Javier Docker Klein Willcox MD 11/30/2018, 2:11 PM  Pager: [phone]

## 2018-11-30 NOTE — Progress Notes (Signed)
CRITICAL CARE NOTE  CC  follow up respiratory failure  SUBJECTIVE Patient remains critically ill Prognosis is guarded     SIGNIFICANT EVENTS -Sedation and analgesia decreased with fentanyl now on minimal settings, mental status has improved performing spontaneous breathing trial with hopes to liberate off mechanical ventilation.  -320pm - re-evaluated -post extubation - drinking water dong well -420- pm re-evaluated - stridor/voice hoarsness - decadron 10 administered  Vitals:   11/30/18 1207 11/30/18 1300  BP:  112/83  Pulse:  91  Resp:  (!) 25  Temp:    SpO2: 95% 95%     REVIEW OF SYSTEMS  PATIENT IS UNABLE TO PROVIDE COMPLETE REVIEW OF SYSTEMS DUE TO acute critical ILLNESS   PHYSICAL EXAMINATION:  GENERAL:critically ill appearing, +resp distress HEAD: Normocephalic, atraumatic.  EYES: Pupils equal, round, reactive to light.  No scleral icterus.  MOUTH: Moist mucosal membrane. NECK: Supple. No thyromegaly. No nodules. No JVD.  PULMONARY: +rhonchi at bases CARDIOVASCULAR: S1 and S2. Regular rate and rhythm. No murmurs, rubs, or gallops.  GASTROINTESTINAL: Soft, nontender, -distended. No masses. Positive bowel sounds. No hepatosplenomegaly.  MUSCULOSKELETAL: No swelling, clubbing, or edema.  NEUROLOGIC: Mild distress due to acute illness SKIN:intact,warm,dry   Labs and Imaging:     -I personally reviewed most recent blood work, imaging and microbiology - significant findings today are acute kidney injury stage II, leukocytosis.  LAB RESULTS: Recent Labs  Lab 11/29/18 0739 11/29/18 1741 11/29/18 2051 11/30/18 0514  NA 137  --  142 143  K 4.4  --  5.9* 3.9  CL 101  --  104 107  CO2 27  --  32 29  BUN 25*  --  37* 38*  CREATININE 1.44* 1.59* 2.11* 1.59*  GLUCOSE 249*  --  105* 175*   Recent Labs  Lab 11/29/18 1741 11/29/18 2051 11/30/18 0514  HGB 13.7 13.3 13.1  HCT 44.2 43.8 42.0  WBC 7.6 6.0 13.9*  PLT 246 269 256     IMAGING RESULTS: Dg  Abd 1 View  Result Date: 11/29/2018 CLINICAL DATA:  Orogastric tube placement. EXAM: ABDOMEN - 1 VIEW COMPARISON:  Abdomen and pelvis CT dated 04/12/2011. FINDINGS: Orogastric tube extending into the stomach with its tip and side hole in the proximal stomach. Gas and stool in normal caliber colon. Clear lung bases. Enlarged heart. Unremarkable bones. IMPRESSION: 1. Orogastric tube tip and side hole in the proximal stomach. 2. Cardiomegaly. Electronically Signed   By: Claudie Revering M.D.   On: 11/29/2018 21:46   Ct Head Wo Contrast  Result Date: 11/30/2018 CLINICAL DATA:  Encephalopathy.  Altered level of consciousness. EXAM: CT HEAD WITHOUT CONTRAST TECHNIQUE: Contiguous axial images were obtained from the base of the skull through the vertex without intravenous contrast. COMPARISON:  MRI 11/16/2017 FINDINGS: Brain: Mild cerebral atrophy. No acute intracranial abnormality. Specifically, no hemorrhage, hydrocephalus, mass lesion, acute infarction, or significant intracranial injury. Vascular: No hyperdense vessel or unexpected calcification. Skull: No acute calvarial abnormality. Sinuses/Orbits: Visualized paranasal sinuses and mastoids clear. Orbital soft tissues unremarkable. Other: None IMPRESSION: Mild cerebral atrophy.  No acute intracranial abnormality. Electronically Signed   By: Rolm Baptise M.D.   On: 11/30/2018 01:41   Portable Chest Xray  Result Date: 11/30/2018 CLINICAL DATA:  Respiratory failure EXAM: PORTABLE CHEST 1 VIEW COMPARISON:  11/29/2018 FINDINGS: Endotracheal tube is in place, tip 5.6 centimeters above the carina. A RIGHT IJ central line tip overlies the superior vena cava. A nasogastric tube is in place, tip beyond the gastroesophageal  junction and beyond the edge of the image. Heart size is accentuated by technique and probably mildly enlarged. There is subsegmental atelectasis or early infiltrate at the RIGHT lung base. IMPRESSION: 1. Lines and tubes as described. 2. Minimal new RIGHT  lower lobe atelectasis or infiltrate. Electronically Signed   By: Elizabeth  Brown M.D.   On: 11/30/2018 08:14   Dg Chest Port 1 View  Result Date: 11/29/2018 CLINICAL DATA:  Intubated. Central line placement. EXAM: PORTABLE CHEST 1 VIEW COMPARISON:  Earlier today. FINDINGS: The endotracheal tube remains in satisfactory position. Interval right jugular catheter with its tip in the superior vena cava. No pneumothorax. Stable enlarged cardiac silhouette. Clear lungs with normal vascularity. Unremarkable bones. IMPRESSION: 1. Right jugular catheter tip in the superior vena cava without pneumothorax. 2. Stable cardiomegaly. Electronically Signed   By: Steven  Reid M.D.   On: 11/29/2018 21:47   Portable Chest X-ray  Result Date: 11/29/2018 CLINICAL DATA:  Status post intubation. EXAM: PORTABLE CHEST 1 VIEW COMPARISON:  11/29/2018. FINDINGS: Interval endotracheal tube in satisfactory position. Stable mildly enlarged cardiac silhouette. Clear lungs with normal vascularity. Mild peribronchial thickening. Unremarkable bones. IMPRESSION: 1. Endotracheal tube in satisfactory position. 2. Stable mild cardiomegaly with interval mild bronchitic changes. Electronically Signed   By: Steven  Reid M.D.   On: 11/29/2018 20:17     ASSESSMENT AND PLAN SYNOPSIS  Severe Hypoxic and Hypercapnic Respiratory Failure -Secondary to influenza A infection -Status post endotracheal intubation, plan for spontaneous breathing trial to liberate off mechanical ventilation. -continue Bronchodilator Therapy -Wean Fio2 and PEEP as tolerated -will perform SAT/SBT when respiratory parameters are met   CARDIAC FAILURE- -appreciate cardiology recommendations Dr. Fath -Echo completed, BNP over 3000 -oxygen as needed -follow up cardiac enzymes as indicated ICU monitoring  Renal Failure-most likely due to ATN -follow chem 7 -follow UO -continue Foley Catheter-assess need daily   NEUROLOGY -Schizophrenia at baseline -  minimal sedation to achieve a RASS goal: -1 Wake up assessment pending    GI/Nutrition GI PROPHYLAXIS as indicated DIET-->TF's as tolerated Constipation protocol as indicated  ENDO - ICU hypoglycemic\Hyperglycemia protocol -check FSBS per protocol   ELECTROLYTES -follow labs as needed -replace as needed -pharmacy consultation   DVT/GI PRX ordered -SCDs  TRANSFUSIONS AS NEEDED MONITOR FSBS ASSESS the need for LABS as needed   Critical care provider statement:    Critical care time (minutes):  75   Critical care time was exclusive of:  Separately billable procedures and  treating other patients   Critical care was necessary to treat or prevent imminent or  life-threatening deterioration of the following conditions:   Acute hypoxic respiratory failure secondary to influenza infection, acute kidney injury   Critical care was time spent personally by me on the following  activities:  Development of treatment plan with patient or surrogate,  discussions with consultants, evaluation of patient's response to  treatment, examination of patient, obtaining history from patient or  surrogate, ordering and performing treatments and interventions, ordering  and review of laboratory studies and re-evaluation of patient's condition   I assumed direction of critical care for this patient from another  provider in my specialty: no      , M.D.  Pulmonary & Critical Care Medicine  [facility]       

## 2018-11-30 NOTE — Clinical Social Work Note (Signed)
Clinical Social Work Assessment  Patient Details  Name: Paul Hernandez MRN: [id number] Date of Birth: [birth year]  Date of referral:  11/30/18               Reason for consult:  Facility Placement, Care Management Concerns                Permission sought to share information with:  Oceanographer granted to share information::  Yes, Verbal Permission Granted  Name::        Agency::  [facility]  Relationship::     Contact Information:     Housing/Transportation Living arrangements for the past 2 months:  Group Home Source of Information:  Medical Team, Friend/Neighbor Patient Interpreter Needed:  None Criminal Activity/Legal Involvement Pertinent to Current Situation/Hospitalization:  No - Comment as needed Significant Relationships:  Merchandiser, retail, Friend, Mental Health Provider Lives with:  Facility Resident Do you feel safe going back to the place where you live?  Yes Need for family participation in patient care:  Yes (Comment)(Patient was recently extubated and is disoriented)  Care giving concerns:  Patient admitted from a Group Home   Social Worker assessment / plan:  The CSW attempted to meet with the patient at bedside. The patient was intubated at the time and no supports were in the room. The CSW contacted Jodi Geralds, a friend of the patient's and former caregiver, who provided information. According to Paul Hernandez, the patient does not have a legal guardian; although, Paul Hernandez and his wife Paul Hernandez are attempting to gain guardianship to assist the patient with decision making. [name] shared that the patient continues to live at [facility] as the repairs needed for his former group home are ongoing. [name] asked if the patient can transition to a "locked unit" where he would received medications and not have the ability to smoke. The CSW explained that a psychiatrist would have to review the patient prior to making that decision, and,  as the patient is not under IVC, the patient would have to agree. [name] shared that the plan is for the patient to return to [facility] once he is stable.   The CSW attempted to contact [facility] with no answer and no ability to leave a voicemail. Currently, the patient has been extubated in the past hour and is disoriented but verbal. The patient has Influenza A and is under droplet precautions. The CSW will follow for discharge facilitation once the patient is stable.   Employment status:  Disabled (Comment on whether or not currently receiving Disability) Insurance information:  Medicare, Medicaid [place] PT Recommendations:  Not assessed at this time Information / Referral to community resources:     Patient/Family's Response to care: The patient's family/friends thanked the CSW for assistance.  Patient/Family's Understanding of and Emotional Response to Diagnosis, Current Treatment, and Prognosis: The patient's friends have been updated as to the patient's extubation and are happy that he is progressing. They understand the comorbid disorders (schizophrenia, DM, COPD) and the patient's risks due to medication non-compliance and treatment non-compliance (continued smoking). They are in agreement with return to the original group home.  Emotional Assessment Appearance:  Appears stated age Attitude/Demeanor/Rapport:  Lethargic Affect (typically observed):  Stable Orientation:  Oriented to Self, Oriented to Place Alcohol / Substance use:  Never Used Psych involvement (Current and /or in the community):  Yes (Comment)  Discharge Needs  Concerns to be addressed:  Compliance Issues Concerns, Discharge Planning Concerns, Care Coordination Readmission within the last 30 days:  No Current discharge risk:  Chronically ill, Psychiatric Illness Barriers to Discharge:  Continued Medical Work up   [employer], LCSW 11/30/2018, 4:03 PM

## 2018-11-30 NOTE — Progress Notes (Signed)
Pharmacy Antibiotic Note  Paul Hernandez is a 54 y.o. male admitted on 11/29/2018 with acute respiratory distress, COPD exacerbation, elevated troponin, and influenza.  Pharmacy has been consulted for Vancomycin and Zosyn dosing for sepsis.   Plan: Zosyn 3.375g IV q8h (4 hour infusion).  Vancomycin 2000 mg IV loading dose, followed by Vancomycin 1250 mg IV Q 12 hrs. Goal AUC 400-550. Expected AUC: 460.8 SCr used: 1.44     Height: 6' (182.9 cm) Weight: 220 lb (99.8 kg) IBW/kg (Calculated) : 77.6  Temp (24hrs), Avg:98.4 F (36.9 C), Min:98.1 F (36.7 C), Max:99.1 F (37.3 C)  Recent Labs  Lab 11/29/18 0739 11/29/18 1741 11/29/18 2051 11/29/18 2052 11/30/18 0514  WBC 9.6 7.6 6.0  --  13.9*  CREATININE 1.44* 1.59* 2.11*  --  1.59*  LATICACIDVEN  --   --   --  2.0* 1.0    Estimated Creatinine Clearance: 67.2 mL/min (A) (by C-G formula based on SCr of 1.59 mg/dL (H)).    Allergies  Allergen Reactions  . Asa [Aspirin]     Antimicrobials this admission: Oseltamivir 2/7 >>  Zosyn 2/8 >>  Vancomycin 2/8 >>  Dose adjustments this admission: N/A  Microbiology results: 2/7 MRSA PCR: negative  Thank you for allowing pharmacy to be a part of this patient's care.  Stormy Card, [facility] 11/30/2018 8:08 AM

## 2018-11-30 NOTE — Progress Notes (Signed)
Uncle Hermine Messick [phone]. CousinsJorja Loa [phone] [facility] Point of care: Felipa [place] [phone]

## 2018-11-30 NOTE — Progress Notes (Signed)
ANTICOAGULATION CONSULT NOTE   Pharmacy Consult for Heparin Dosing Indication: chest pain/ACS  Allergies  Allergen Reactions  . Asa [Aspirin]     Patient Measurements: Height: 6' (182.9 cm) Weight: 220 lb (99.8 kg) IBW/kg (Calculated) : 77.6 Heparin Dosing Weight: 97.8  Vital Signs: Temp: 98.2 F (36.8 C) (02/08 0100) Temp Source: Oral (02/08 0100) BP: 116/81 (02/08 0200) Pulse Rate: 104 (02/08 0200)  Labs: Recent Labs    11/29/18 0739 11/29/18 1019 11/29/18 1741 11/29/18 2051  HGB 14.7  --  13.7 13.3  HCT 46.5  --  44.2 43.8  PLT 308  --  246 269  APTT 30  --   --   --   LABPROT 15.6*  --   --  16.1*  INR 1.25  --   --  1.30  HEPARINUNFRC  --   --  0.57  --   CREATININE 1.44*  --  1.59* 2.11*  TROPONINI 0.60* 0.74* 1.42* 1.62*    Estimated Creatinine Clearance: 50.7 mL/min (A) (by C-G formula based on SCr of 2.11 mg/dL (H)).   Medical History: Past Medical History:  Diagnosis Date  . CHF (congestive heart failure) (HCC)   . COPD (chronic obstructive pulmonary disease) (HCC)   . Diabetes mellitus without complication (HCC)   . Hypertension   . Mental health disorder      Goal of Therapy:  Heparin level 0.3-0.7 units/ml Monitor platelets by anticoagulation protocol: Yes   Plan:  Give 4000 units bolus x 1 Start heparin infusion at 1200 units/hr   02/07 1741  HL= 0.57. Will continue current drip rate and check confirmatory level in 6 hours.  02/08 @ 0240:  Heparin was d/c'd earlier on 2/7 around 1800 due to possible concern that pt had a CVA .  Pt returned from CT with no evidence of CVA.  Will restart heparin at previous rate @ 02/08 @ 0300.  Will recheck HL 6 hrs after restart.   Corrinna Karapetyan D 11/30/2018,2:42 AM

## 2018-11-30 NOTE — Progress Notes (Signed)
*  PRELIMINARY RESULTS* Echocardiogram 2D Echocardiogram has been performed.  Paul Hernandez 11/30/2018, 1:44 PM

## 2018-11-30 NOTE — Plan of Care (Signed)
Pt cont on full vent support, heparin restarted after CT head done and neg, weaning off levophed, cont on dopamine gtt and fent  gtt.Family came and was updated by NP.

## 2018-11-30 NOTE — Progress Notes (Signed)
Patient extubated approx 1420 to 2 liters [place], O2 sats in mid 90s, pt drowsy but awakens easily, very hoarse, disoriented to time and place and situation, easliy reoriented, asks for water, taking sips w/ no s/sx aspiration or distress, his airway sounds mildly stridorous, will inform MD.

## 2018-11-30 NOTE — Progress Notes (Signed)
ANTICOAGULATION CONSULT NOTE   Pharmacy Consult for Heparin Dosing Indication: chest pain/ACS  Allergies  Allergen Reactions  . Asa [Aspirin]     Patient Measurements: Height: 6' (182.9 cm) Weight: 220 lb (99.8 kg) IBW/kg (Calculated) : 77.6 Heparin Dosing Weight: 97.8  Vital Signs: Temp: 97.7 F (36.5 C) (02/08 1200) Temp Source: Axillary (02/08 0800) BP: 112/83 (02/08 1300) Pulse Rate: 91 (02/08 1300)  Labs: Recent Labs    11/29/18 0739 11/29/18 1019 11/29/18 1741 11/29/18 2051 11/30/18 0514 11/30/18 0912 11/30/18 1428  HGB 14.7  --  13.7 13.3 13.1  --   --   HCT 46.5  --  44.2 43.8 42.0  --   --   PLT 308  --  246 269 256  --   --   APTT 30  --   --   --   --   --   --   LABPROT 15.6*  --   --  16.1*  --   --   --   INR 1.25  --   --  1.30  --   --   --   HEPARINUNFRC  --   --  0.57  --   --  0.32 0.53  CREATININE 1.44*  --  1.59* 2.11* 1.59*  --   --   TROPONINI 0.60* 0.74* 1.42* 1.62*  --   --   --     Estimated Creatinine Clearance: 67.2 mL/min (A) (by C-G formula based on SCr of 1.59 mg/dL (H)).   Medical History: Past Medical History:  Diagnosis Date  . CHF (congestive heart failure) (HCC)   . COPD (chronic obstructive pulmonary disease) (HCC)   . Diabetes mellitus without complication (HCC)   . Hypertension   . Mental health disorder    Assessment:  02/07 1741  HL= 0.57. Will continue current drip rate and check confirmatory level in 6 hours.  02/08 @ 0240:  Heparin was d/c'd earlier on 2/7 around 1800 due to possible concern that pt had a CVA .  Pt returned from CT with no evidence of CVA.  Will restart heparin at previous rate @ 02/08 @ 0300.  Will recheck HL 6 hrs after restart.    02/08 ~ 09:00  HL = 0.32.  Continue current dosing. Recheck HL today at 15:00.   02/08 1429 HL = 0.53. Continue current dosing.   Goal of Therapy:  Heparin level 0.3-0.7 units/ml Monitor platelets by anticoagulation protocol: Yes   Plan:  Will continue  current rate. Will order heparin level with AM labs.     Ronnald Ramp, PharmD, BCPS  11/30/2018,3:24 PM

## 2018-12-01 ENCOUNTER — Inpatient Hospital Stay: Payer: Medicare Other

## 2018-12-01 DIAGNOSIS — J441 Chronic obstructive pulmonary disease with (acute) exacerbation: Secondary | ICD-10-CM

## 2018-12-01 DIAGNOSIS — J9601 Acute respiratory failure with hypoxia: Secondary | ICD-10-CM

## 2018-12-01 DIAGNOSIS — F203 Undifferentiated schizophrenia: Secondary | ICD-10-CM

## 2018-12-01 DIAGNOSIS — J101 Influenza due to other identified influenza virus with other respiratory manifestations: Secondary | ICD-10-CM

## 2018-12-01 LAB — PHOSPHORUS: PHOSPHORUS: 5 mg/dL — AB (ref 2.5–4.6)

## 2018-12-01 LAB — BASIC METABOLIC PANEL
Anion gap: 5 (ref 5–15)
BUN: 48 mg/dL — ABNORMAL HIGH (ref 6–20)
CO2: 30 mmol/L (ref 22–32)
Calcium: 7.4 mg/dL — ABNORMAL LOW (ref 8.9–10.3)
Chloride: 105 mmol/L (ref 98–111)
Creatinine, Ser: 1.71 mg/dL — ABNORMAL HIGH (ref 0.61–1.24)
GFR, EST AFRICAN AMERICAN: 53 mL/min — AB (ref >60)
GFR, EST NON AFRICAN AMERICAN: 45 mL/min — AB (ref >60)
Glucose, Bld: 191 mg/dL — ABNORMAL HIGH (ref 70–99)
Potassium: 4.3 mmol/L (ref 3.5–5.1)
Sodium: 140 mmol/L (ref 135–145)

## 2018-12-01 LAB — GLUCOSE, CAPILLARY
GLUCOSE-CAPILLARY: 236 mg/dL — AB (ref 70–99)
Glucose-Capillary: 196 mg/dL — ABNORMAL HIGH (ref 70–99)
Glucose-Capillary: 178 mg/dL — ABNORMAL HIGH (ref 70–99)
Glucose-Capillary: 350 mg/dL — ABNORMAL HIGH (ref 70–99)
Glucose-Capillary: 279 mg/dL — ABNORMAL HIGH (ref 70–99)

## 2018-12-01 LAB — CBC
HCT: 43.7 % (ref 39.0–52.0)
HEMOGLOBIN: 13.6 g/dL (ref 13.0–17.0)
MCH: 28.9 pg (ref 26.0–34.0)
MCHC: 31.1 g/dL (ref 30.0–36.0)
MCV: 92.8 fL (ref 80.0–100.0)
NRBC: 0 % (ref 0.0–0.2)
Platelets: 266 10*3/uL (ref 150–400)
RBC: 4.71 MIL/uL (ref 4.22–5.81)
RDW: 14.6 % (ref 11.5–15.5)
WBC: 18.9 10*3/uL — ABNORMAL HIGH (ref 4.0–10.5)

## 2018-12-01 LAB — ECHOCARDIOGRAM COMPLETE
Height: 72 in
Weight: 3520 oz

## 2018-12-01 LAB — PROCALCITONIN: Procalcitonin: 1.14 ng/mL

## 2018-12-01 LAB — HEPARIN LEVEL (UNFRACTIONATED)
Heparin Unfractionated: 0.74 IU/mL — ABNORMAL HIGH (ref 0.30–0.70)
Heparin Unfractionated: 0.73 IU/mL — ABNORMAL HIGH (ref 0.30–0.70)
Heparin Unfractionated: 0.54 IU/mL (ref 0.30–0.70)

## 2018-12-01 LAB — MAGNESIUM: Magnesium: 2.4 mg/dL (ref 1.7–2.4)

## 2018-12-01 MED ORDER — DEXAMETHASONE SODIUM PHOSPHATE 4 MG/ML IJ SOLN
10.0000 mg | Freq: Once | INTRAMUSCULAR | Status: AC
  Administered 2018-12-01: 10 mg via INTRAVENOUS
  Filled 2018-12-01: qty 3

## 2018-12-01 MED ORDER — RACEPINEPHRINE HCL 2.25 % IN NEBU
0.5000 mL | INHALATION_SOLUTION | Freq: Once | RESPIRATORY_TRACT | Status: AC
  Administered 2018-12-01: 0.5 mL via RESPIRATORY_TRACT
  Filled 2018-12-01: qty 0.5

## 2018-12-01 MED ORDER — HEPARIN (PORCINE) 25000 UT/250ML-% IV SOLN
1000.0000 [IU]/h | INTRAVENOUS | Status: DC
  Administered 2018-12-01 – 2018-12-02 (×2): 1000 [IU]/h via INTRAVENOUS
  Filled 2018-12-01: qty 250

## 2018-12-01 MED ORDER — HALOPERIDOL 5 MG PO TABS
5.0000 mg | ORAL_TABLET | Freq: Two times a day (BID) | ORAL | Status: DC
  Administered 2018-12-01 – 2018-12-04 (×6): 5 mg via ORAL
  Filled 2018-12-01 (×8): qty 1

## 2018-12-01 NOTE — Progress Notes (Signed)
Patient Name: Paul Hernandez Date of Encounter: 12/01/2018  Hospital Problem List     Principal Problem:   Acute respiratory failure with hypoxia [facility] Active Problems:   COPD exacerbation (HCC)   Influenza A    Patient Profile     53 year old male with schizoaffective disorder admitted with respiratory failure.  Extubated.  Difficult historian.  Blood pressure 103/84 with a pulse in the 60s to 70s.  Pulse ox 94% on nasal cannula. Subjective   Extubated.  Difficult historian due to underlying mental status apparently.  Inpatient Medications    . aspirin EC  325 mg Oral Daily  . benztropine  2 mg Oral QHS  . budesonide (PULMICORT) nebulizer solution  0.5 mg Nebulization BID  . carvedilol  25 mg Oral BID WC  . diphenhydrAMINE  25 mg Oral QHS  . ezetimibe  10 mg Oral Daily  . fluticasone  1 spray Each Nare Daily  . insulin aspart  0-5 Units Subcutaneous QHS  . insulin aspart  0-9 Units Subcutaneous TID WC  . ipratropium-albuterol  3 mL Nebulization Q4H  . isosorbide-hydrALAZINE  1 tablet Oral TID  . losartan  50 mg Oral Daily  . mouth rinse  15 mL Mouth Rinse BID  . methylPREDNISolone (SOLU-MEDROL) injection  60 mg Intravenous Q6H  . oseltamivir  75 mg Oral BID  . pantoprazole (PROTONIX) IV  40 mg Intravenous Daily  . rosuvastatin  40 mg Oral Daily  . sodium chloride flush  10-40 mL Intracatheter Q12H  . tiotropium  18 mcg Inhalation Daily    Vital Signs    Vitals:   12/01/18 0400 12/01/18 0500 12/01/18 0600 12/01/18 0700  BP: (!) 81/62 92/73 92/71  103/84  Pulse: (!) 59 60 (!) 59 (!) 54  Resp: (!) 9 (!) 24 (!) 9 12  Temp: (!) 96.5 F (35.8 C)     TempSrc: Axillary     SpO2: 97% 99% 97% 94%  Weight:      Height:        Intake/Output Summary (Last 24 hours) at 12/01/2018 1000 Last data filed at 12/01/2018 0400 Gross per 24 hour  Intake 1288.15 ml  Output 690 ml  Net 598.15 ml   Filed Weights   11/29/18 0735  Weight: 99.8 kg    Physical Exam    GEN: Well  nourished, obese. HEENT: normal.  Neck: Supple, no JVD, carotid bruits, or masses. Cardiac: RRR, difficult to hear heart tones. Respiratory: Scattered rhonchi.  No obvious rales. GI: Soft, nontender, nondistended, BS + x 4. MS: no deformity or atrophy. Skin: warm and dry, no rash. Neuro: Decreased sensorium all nonfocal. Psych: Does not answer questions appropriately.  Labs    CBC Recent Labs    11/29/18 0739  11/30/18 0514 12/01/18 0443  WBC 9.6   < > 13.9* 18.9*  NEUTROABS 8.7*  --   --   --   HGB 14.7   < > 13.1 13.6  HCT 46.5   < > 42.0 43.7  MCV 91.7   < > 92.7 92.8  PLT 308   < > 256 266   < > = values in this interval not displayed.   Basic Metabolic Panel Recent Labs    70/35/00 2155 12/01/18 0443  NA 142 140  K 4.2 4.3  CL 106 105  CO2 31 30  GLUCOSE 185* 191*  BUN 43* 48*  CREATININE 1.56* 1.71*  CALCIUM 7.2* 7.4*  MG 2.3 2.4  PHOS 4.3 5.0*  Liver Function Tests Recent Labs    11/29/18 2051 11/30/18 0912  AST 222* 446*  ALT 154* 301*  ALKPHOS 49 47  BILITOT 0.9 0.9  PROT 5.4* 5.0*  ALBUMIN 3.1* 2.7*   No results for input(s): LIPASE, AMYLASE in the last 72 hours. Cardiac Enzymes Recent Labs    11/29/18 1019 11/29/18 1741 11/29/18 2051  TROPONINI 0.74* 1.42* 1.62*   BNP Recent Labs    11/29/18 0739  BNP 3,045.0*   D-Dimer No results for input(s): DDIMER in the last 72 hours. Hemoglobin A1C Recent Labs    11/29/18 1053  HGBA1C 8.3*   Fasting Lipid Panel Recent Labs    11/29/18 0739 11/29/18 2051  CHOL 210*  --   HDL 47  --   LDLCALC 149*  --   TRIG 68 37  CHOLHDL 4.5  --    Thyroid Function Tests No results for input(s): TSH, T4TOTAL, T3FREE, THYROIDAB in the last 72 hours.  Invalid input(s): FREET3  Telemetry    Sinus rhythm  ECG    Sinus rhythm with no ischemia  Radiology    Dg Abd 1 View  Result Date: 11/29/2018 CLINICAL DATA:  Orogastric tube placement. EXAM: ABDOMEN - 1 VIEW COMPARISON:  Abdomen and  pelvis CT dated 04/12/2011. FINDINGS: Orogastric tube extending into the stomach with its tip and side hole in the proximal stomach. Gas and stool in normal caliber colon. Clear lung bases. Enlarged heart. Unremarkable bones. IMPRESSION: 1. Orogastric tube tip and side hole in the proximal stomach. 2. Cardiomegaly. Electronically Signed   By: Beckie SaltsSteven  Reid M.D.   On: 11/29/2018 21:46   Ct Head Wo Contrast  Result Date: 11/30/2018 CLINICAL DATA:  Encephalopathy.  Altered level of consciousness. EXAM: CT HEAD WITHOUT CONTRAST TECHNIQUE: Contiguous axial images were obtained from the base of the skull through the vertex without intravenous contrast. COMPARISON:  MRI 11/16/2017 FINDINGS: Brain: Mild cerebral atrophy. No acute intracranial abnormality. Specifically, no hemorrhage, hydrocephalus, mass lesion, acute infarction, or significant intracranial injury. Vascular: No hyperdense vessel or unexpected calcification. Skull: No acute calvarial abnormality. Sinuses/Orbits: Visualized paranasal sinuses and mastoids clear. Orbital soft tissues unremarkable. Other: None IMPRESSION: Mild cerebral atrophy.  No acute intracranial abnormality. Electronically Signed   By: Charlett NoseKevin  Dover M.D.   On: 11/30/2018 01:41   Portable Chest Xray  Result Date: 11/30/2018 CLINICAL DATA:  Respiratory failure EXAM: PORTABLE CHEST 1 VIEW COMPARISON:  11/29/2018 FINDINGS: Endotracheal tube is in place, tip 5.6 centimeters above the carina. A RIGHT IJ central line tip overlies the superior vena cava. A nasogastric tube is in place, tip beyond the gastroesophageal junction and beyond the edge of the image. Heart size is accentuated by technique and probably mildly enlarged. There is subsegmental atelectasis or early infiltrate at the RIGHT lung base. IMPRESSION: 1. Lines and tubes as described. 2. Minimal new RIGHT lower lobe atelectasis or infiltrate. Electronically Signed   By: Norva PavlovElizabeth  Brown M.D.   On: 11/30/2018 08:14   Dg Chest Port  1 View  Result Date: 11/29/2018 CLINICAL DATA:  Intubated. Central line placement. EXAM: PORTABLE CHEST 1 VIEW COMPARISON:  Earlier today. FINDINGS: The endotracheal tube remains in satisfactory position. Interval right jugular catheter with its tip in the superior vena cava. No pneumothorax. Stable enlarged cardiac silhouette. Clear lungs with normal vascularity. Unremarkable bones. IMPRESSION: 1. Right jugular catheter tip in the superior vena cava without pneumothorax. 2. Stable cardiomegaly. Electronically Signed   By: Beckie SaltsSteven  Reid M.D.   On: 11/29/2018 21:47  Portable Chest X-ray  Result Date: 11/29/2018 CLINICAL DATA:  Status post intubation. EXAM: PORTABLE CHEST 1 VIEW COMPARISON:  11/29/2018. FINDINGS: Interval endotracheal tube in satisfactory position. Stable mildly enlarged cardiac silhouette. Clear lungs with normal vascularity. Mild peribronchial thickening. Unremarkable bones. IMPRESSION: 1. Endotracheal tube in satisfactory position. 2. Stable mild cardiomegaly with interval mild bronchitic changes. Electronically Signed   By: Beckie Salts M.D.   On: 11/29/2018 20:17   Dg Chest Port 1 View  Result Date: 11/29/2018 CLINICAL DATA:  Shortness of breath EXAM: PORTABLE CHEST 1 VIEW COMPARISON:  October 25, 2017 FINDINGS: No edema or consolidation. Heart is mildly enlarged with pulmonary vascularity normal. No adenopathy. No bone lesions. IMPRESSION: No edema or consolidation.  Mild cardiac enlargement. Electronically Signed   By: Bretta Bang III M.D.   On: 11/29/2018 07:51    Assessment & Plan    Respiratory failure-currently extubated and on nasal cannula.  Difficult historian.  Echo showed reduced LV function similar to previous echo.  Unable to comment on whether he is short of breath or having chest pain.  Is +3.25 L since admission.  Blood pressure 103/84 off of dopamine.  Would continue with current oxygen therapy.  Will order a chest x-ray assess whether pulmonary edema is playing  a role.  May need to consider careful diuresis following renal function and hemodynamics.  Is being treated with empiric antibiotics occluding vancomycin and Zosyn.  Troponin elevation-etiology may be demand versus acute coronary syndrome.  Likely demand ischemia however.  We will continue with heparin for 24 more hours.  We will continue with carvedilol at 25 mg twice daily, enteric-coated aspirin, losartan 50 mg daily.  Echo revealed reduced LV function similar to previous echo.  We will continue with aforementioned medications. Signed, Darlin Priestly Liyat Faulkenberry MD 12/01/2018, 10:00 AM  Pager: [phone]

## 2018-12-01 NOTE — Progress Notes (Signed)
ANTICOAGULATION CONSULT NOTE   Pharmacy Consult for Heparin Dosing Indication: chest pain/ACS  Allergies  Allergen Reactions  . Asa [Aspirin]     Patient Measurements: Height: 6' (182.9 cm) Weight: 220 lb (99.8 kg) IBW/kg (Calculated) : 77.6 Heparin Dosing Weight: 97.8  Vital Signs: Temp: 97.8 F (36.6 C) (02/09 1600) Temp Source: Axillary (02/09 1600) BP: 87/66 (02/09 1800) Pulse Rate: 56 (02/09 1800)  Labs: Recent Labs    11/29/18 0739 11/29/18 1019 11/29/18 1741 11/29/18 2051 11/30/18 0514  11/30/18 2155 12/01/18 0443 12/01/18 1211 12/01/18 1830  HGB 14.7  --  13.7 13.3 13.1  --   --  13.6  --   --   HCT 46.5  --  44.2 43.8 42.0  --   --  43.7  --   --   PLT 308  --  246 269 256  --   --  266  --   --   APTT 30  --   --   --   --   --   --   --   --   --   LABPROT 15.6*  --   --  16.1*  --   --   --   --   --   --   INR 1.25  --   --  1.30  --   --   --   --   --   --   HEPARINUNFRC  --   --  0.57  --   --    < >  --  0.74* 0.73* 0.54  CREATININE 1.44*  --  1.59* 2.11* 1.59*  --  1.56* 1.71*  --   --   TROPONINI 0.60* 0.74* 1.42* 1.62*  --   --   --   --   --   --    < > = values in this interval not displayed.    Estimated Creatinine Clearance: 62.5 mL/min (A) (by C-G formula based on SCr of 1.71 mg/dL (H)).   Medical History: Past Medical History:  Diagnosis Date  . CHF (congestive heart failure) (HCC)   . COPD (chronic obstructive pulmonary disease) (HCC)   . Diabetes mellitus without complication (HCC)   . Hypertension   . Mental health disorder    Assessment:  2/9 1830 Heparin level 0.54 - within goal.   Goal of Therapy:  Heparin level 0.3-0.7 units/ml Monitor platelets by anticoagulation protocol: Yes   Plan:  Will continue with current rate. Will order a heparin level in 6 hours and switch to daily levels if next level is therapeutic.      Ronnald Ramp, PharmD, BCPS  12/01/2018,7:59 PM

## 2018-12-01 NOTE — Progress Notes (Signed)
[facility] - [place] [facility]                                                                                                                                                                                  Patient Demographics   Paul Hernandez, is a 53 y.o. male, DOB - 1966-03-27, JXB:[id number]  Admit date - 11/29/2018   Admitting Physician Altamese DillingVaibhavkumar Vachhani, MD  Outpatient Primary MD for the patient is [name], [name], MD   LOS - 2  Subjective: Patient extubated however he is a poor historian    Review of Systems:   CONSTITUTIONAL: Extubated not able to provide any review of systems  Vitals:   Vitals:   12/01/18 0900 12/01/18 1000 12/01/18 1100 12/01/18 1200  BP: 101/82 107/77 96/73 (!) 107/94  Pulse: (!) 58 61 (!) 58 71  Resp: 10 13 12 19   Temp:    97.7 F (36.5 C)  TempSrc:    Oral  SpO2: 100% 100% 94% 97%  Weight:      Height:        Wt Readings from Last 3 Encounters:  11/29/18 99.8 kg  10/08/18 106.6 kg  11/29/17 101.6 kg     Intake/Output Summary (Last 24 hours) at 12/01/2018 1347 Last data filed at 12/01/2018 1300 Gross per 24 hour  Intake 1894.39 ml  Output 525 ml  Net 1369.39 ml    Physical Exam:   GENERAL: Chronically ill-appearing HEAD, EYES, EARS, NOSE AND THROAT: Atraumatic, normocephalic. Extraocular muscles are intact. Pupils equal and reactive to light. Sclerae anicteric. No conjunctival injection. No oro-pharyngeal erythema.  NECK: Supple. There is no jugular venous distention. No bruits, no lymphadenopathy, no thyromegaly.  HEART: Regular rate and rhythm,. No murmurs, no rubs, no clicks.  LUNGS: Clear to auscultation bilaterally. No rales or rhonchi. No wheezes.  ABDOMEN: Soft, flat, nontender, nondistended. Has good bowel sounds. No hepatosplenomegaly appreciated.  EXTREMITIES: No evidence of any cyanosis, clubbing, or peripheral edema.  +2 pedal and radial pulses bilaterally.  NEUROLOGIC: Patient drowsy  sKIN:  Moist and warm with no rashes appreciated.  Psych: Not anxious, depressed LN: No inguinal LN enlargement    Antibiotics   Anti-infectives (From admission, onward)   Start     Dose/Rate Route Frequency Ordered Stop   11/30/18 2030  vancomycin (VANCOCIN) 1,250 mg in sodium chloride 0.9 % 250 mL IVPB     1,250 mg 166.7 mL/hr over 90 Minutes Intravenous Every 12 hours 11/30/18 1005     11/30/18 0800  piperacillin-tazobactam (ZOSYN) IVPB 3.375 g     3.375 g 12.5 mL/hr over 240 Minutes Intravenous Every 8 hours 11/30/18 0742     11/30/18  0800  vancomycin (VANCOCIN) 2,000 mg in sodium chloride 0.9 % 500 mL IVPB     2,000 mg 250 mL/hr over 120 Minutes Intravenous  Once 11/30/18 0744 11/30/18 1045   11/29/18 1130  oseltamivir (TAMIFLU) capsule 75 mg     75 mg Oral 2 times daily 11/29/18 1100 12/04/18 0959   11/29/18 0845  oseltamivir (TAMIFLU) capsule 75 mg     75 mg Oral  Once 11/29/18 0835 11/29/18 0843      Medications   Scheduled Meds: . aspirin EC  325 mg Oral Daily  . benztropine  2 mg Oral QHS  . budesonide (PULMICORT) nebulizer solution  0.5 mg Nebulization BID  . carvedilol  25 mg Oral BID WC  . diphenhydrAMINE  25 mg Oral QHS  . ezetimibe  10 mg Oral Daily  . fluticasone  1 spray Each Nare Daily  . haloperidol  5 mg Oral BID  . insulin aspart  0-5 Units Subcutaneous QHS  . insulin aspart  0-9 Units Subcutaneous TID WC  . ipratropium-albuterol  3 mL Nebulization Q4H  . isosorbide-hydrALAZINE  1 tablet Oral TID  . losartan  50 mg Oral Daily  . mouth rinse  15 mL Mouth Rinse BID  . methylPREDNISolone (SOLU-MEDROL) injection  60 mg Intravenous Q6H  . oseltamivir  75 mg Oral BID  . pantoprazole (PROTONIX) IV  40 mg Intravenous Daily  . rosuvastatin  40 mg Oral Daily  . sodium chloride flush  10-40 mL Intracatheter Q12H  . tiotropium  18 mcg Inhalation Daily   Continuous Infusions: . sodium chloride Stopped (11/30/18 0045)  . dexmedetomidine (PRECEDEX) IV infusion 0.2  mcg/kg/hr (12/01/18 1000)  . DOPamine Stopped (11/30/18 1337)  . fentaNYL infusion INTRAVENOUS Stopped (11/30/18 1330)  . heparin 1,000 Units/hr (12/01/18 1322)  . norepinephrine (LEVOPHED) Adult infusion Stopped (11/30/18 0445)  . piperacillin-tazobactam (ZOSYN)  IV 3.375 g (12/01/18 0902)  . propofol (DIPRIVAN) infusion Stopped (11/29/18 2000)  . vancomycin 166.7 mL/hr at 12/01/18 1000   PRN Meds:.sodium chloride, bisacodyl, docusate sodium, midazolam, midazolam, nitroGLYCERIN, sennosides, sodium chloride flush   Data Review:   Micro Results Recent Results (from the past 240 hour(s))  MRSA PCR Screening     Status: None   Collection Time: 11/29/18  5:33 PM  Result Value Ref Range Status   MRSA by PCR NEGATIVE NEGATIVE Final    Comment:        The GeneXpert MRSA Assay (FDA approved for NASAL specimens only), is one component of a comprehensive MRSA colonization surveillance program. It is not intended to diagnose MRSA infection nor to guide or monitor treatment for MRSA infections. Performed at [facility], [address] [place], [place] [address]     Radiology Reports Dg Abd 1 View  Result Date: 11/29/2018 CLINICAL DATA:  Orogastric tube placement. EXAM: ABDOMEN - 1 VIEW COMPARISON:  Abdomen and pelvis CT dated 04/12/2011. FINDINGS: Orogastric tube extending into the stomach with its tip and side hole in the proximal stomach. Gas and stool in normal caliber colon. Clear lung bases. Enlarged heart. Unremarkable bones. IMPRESSION: 1. Orogastric tube tip and side hole in the proximal stomach. 2. Cardiomegaly. Electronically Signed   By: Beckie Salts M.D.   On: 11/29/2018 21:46   Ct Head Wo Contrast  Result Date: 11/30/2018 CLINICAL DATA:  Encephalopathy.  Altered level of consciousness. EXAM: CT HEAD WITHOUT CONTRAST TECHNIQUE: Contiguous axial images were obtained from the base of the skull through the vertex without intravenous contrast. COMPARISON:  MRI  11/16/2017 FINDINGS: Brain: Mild cerebral atrophy. No acute intracranial abnormality. Specifically, no hemorrhage, hydrocephalus, mass lesion, acute infarction, or significant intracranial injury. Vascular: No hyperdense vessel or unexpected calcification. Skull: No acute calvarial abnormality. Sinuses/Orbits: Visualized paranasal sinuses and mastoids clear. Orbital soft tissues unremarkable. Other: None IMPRESSION: Mild cerebral atrophy.  No acute intracranial abnormality. Electronically Signed   By: Charlett Nose M.D.   On: 11/30/2018 01:41   Dg Chest Port 1 View  Result Date: 12/01/2018 CLINICAL DATA:  53 year old male recently admitted with acute respiratory failure. EXAM: PORTABLE CHEST 1 VIEW COMPARISON:  To a 20 and earlier. FINDINGS: Portable AP upright view at 1016 hours. Extubated and enteric tube removed. Stable right IJ central line. Stable lung volumes and mediastinal contours. No pneumothorax, pulmonary edema, pleural effusion or consolidation. Minimal bibasilar increased opacity appears stable and probably reflects crowding. Visualized tracheal air column is within normal limits. IMPRESSION: 1. Extubated and enteric tube removed. 2. Stable lung volumes.  No acute cardiopulmonary abnormality. Electronically Signed   By: Odessa Fleming M.D.   On: 12/01/2018 13:24   Portable Chest Xray  Result Date: 11/30/2018 CLINICAL DATA:  Respiratory failure EXAM: PORTABLE CHEST 1 VIEW COMPARISON:  11/29/2018 FINDINGS: Endotracheal tube is in place, tip 5.6 centimeters above the carina. A RIGHT IJ central line tip overlies the superior vena cava. A nasogastric tube is in place, tip beyond the gastroesophageal junction and beyond the edge of the image. Heart size is accentuated by technique and probably mildly enlarged. There is subsegmental atelectasis or early infiltrate at the RIGHT lung base. IMPRESSION: 1. Lines and tubes as described. 2. Minimal new RIGHT lower lobe atelectasis or infiltrate. Electronically Signed    By: Norva Pavlov M.D.   On: 11/30/2018 08:14   Dg Chest Port 1 View  Result Date: 11/29/2018 CLINICAL DATA:  Intubated. Central line placement. EXAM: PORTABLE CHEST 1 VIEW COMPARISON:  Earlier today. FINDINGS: The endotracheal tube remains in satisfactory position. Interval right jugular catheter with its tip in the superior vena cava. No pneumothorax. Stable enlarged cardiac silhouette. Clear lungs with normal vascularity. Unremarkable bones. IMPRESSION: 1. Right jugular catheter tip in the superior vena cava without pneumothorax. 2. Stable cardiomegaly. Electronically Signed   By: Beckie Salts M.D.   On: 11/29/2018 21:47   Portable Chest X-ray  Result Date: 11/29/2018 CLINICAL DATA:  Status post intubation. EXAM: PORTABLE CHEST 1 VIEW COMPARISON:  11/29/2018. FINDINGS: Interval endotracheal tube in satisfactory position. Stable mildly enlarged cardiac silhouette. Clear lungs with normal vascularity. Mild peribronchial thickening. Unremarkable bones. IMPRESSION: 1. Endotracheal tube in satisfactory position. 2. Stable mild cardiomegaly with interval mild bronchitic changes. Electronically Signed   By: Beckie Salts M.D.   On: 11/29/2018 20:17   Dg Chest Port 1 View  Result Date: 11/29/2018 CLINICAL DATA:  Shortness of breath EXAM: PORTABLE CHEST 1 VIEW COMPARISON:  October 25, 2017 FINDINGS: No edema or consolidation. Heart is mildly enlarged with pulmonary vascularity normal. No adenopathy. No bone lesions. IMPRESSION: No edema or consolidation.  Mild cardiac enlargement. Electronically Signed   By: Bretta Bang III M.D.   On: 11/29/2018 07:51     CBC Recent Labs  Lab 11/29/18 0739 11/29/18 1741 11/29/18 2051 11/30/18 0514 12/01/18 0443  WBC 9.6 7.6 6.0 13.9* 18.9*  HGB 14.7 13.7 13.3 13.1 13.6  HCT 46.5 44.2 43.8 42.0 43.7  PLT 308 246 269 256 266  MCV 91.7 94.6 96.7 92.7 92.8  MCH 29.0 29.3 29.4 28.9 28.9  MCHC 31.6  31.0 30.4 31.2 31.1  RDW 13.9 14.0 14.2 14.2 14.6  LYMPHSABS  0.2*  --   --   --   --   MONOABS 0.5  --   --   --   --   EOSABS 0.0  --   --   --   --   BASOSABS 0.0  --   --   --   --     Chemistries  Recent Labs  Lab 11/29/18 0739 11/29/18 1741 11/29/18 2051 11/30/18 0514 11/30/18 0912 11/30/18 2155 12/01/18 0443  NA 137  --  142 143  --  142 140  K 4.4  --  5.9* 3.9  --  4.2 4.3  CL 101  --  104 107  --  106 105  CO2 27  --  32 29  --  31 30  GLUCOSE 249*  --  105* 175*  --  185* 191*  BUN 25*  --  37* 38*  --  43* 48*  CREATININE 1.44* 1.59* 2.11* 1.59*  --  1.56* 1.71*  CALCIUM 8.1*  --  7.7* 7.4*  --  7.2* 7.4*  MG  --   --  2.5*  --   --  2.3 2.4  AST  --   --  222*  --  446*  --   --   ALT  --   --  154*  --  301*  --   --   ALKPHOS  --   --  49  --  47  --   --   BILITOT  --   --  0.9  --  0.9  --   --    ------------------------------------------------------------------------------------------------------------------ estimated creatinine clearance is 62.5 mL/min (A) (by C-G formula based on SCr of 1.71 mg/dL (H)). ------------------------------------------------------------------------------------------------------------------ Recent Labs    11/29/18 1053  HGBA1C 8.3*   ------------------------------------------------------------------------------------------------------------------ Recent Labs    11/29/18 0739 11/29/18 2051  CHOL 210*  --   HDL 47  --   LDLCALC 149*  --   TRIG 68 37  CHOLHDL 4.5  --    ------------------------------------------------------------------------------------------------------------------ No results for input(s): TSH, T4TOTAL, T3FREE, THYROIDAB in the last 72 hours.  Invalid input(s): FREET3 ------------------------------------------------------------------------------------------------------------------ No results for input(s): VITAMINB12, FOLATE, FERRITIN, TIBC, IRON, RETICCTPCT in the last 72 hours.  Coagulation profile Recent Labs  Lab 11/29/18 0739 11/29/18 2051  INR 1.25  1.30    No results for input(s): DDIMER in the last 72 hours.  Cardiac Enzymes Recent Labs  Lab 11/29/18 1019 11/29/18 1741 11/29/18 2051  TROPONINI 0.74* 1.42* 1.62*   ------------------------------------------------------------------------------------------------------------------ Invalid input(s): POCBNP    Assessment & Plan   *Acute respiratory failure with hypoxia COPD exacerbation Secondary to influenza A Continue Tamiflu Continue nebulizer therapy  *Elevated troponin-patient denies any chest pain.  He has some sinus tachycardia on EKG due to respiratory failure but no other changes. Seen by cardiology no ACS Started on heparin drip by ER physician. I will call cardiology consult for help.  Follow serial troponins. Continue aspirin and carvedilol.  *Hypotension continue pressors  *Acute renal insufficiency Renal function slightly altered compared to previous, continue to monitor. I will not give IV fluids currently because of significantly low ejection fraction.  *Chronic systolic congestive heart failure Chest x-ray is clear and does not have symptoms of CHF exacerbation currently. Ejection fraction 30% as well past records, continue to monitor.  *Diabetes mellitus Hold metformin due to worsening renal function Keep on sliding scale coverage.  *  Active smoking Counseled to quit smoking for 4 minutes and offered nicotine patch.     Code Status Orders  (From admission, onward)         Start     Ordered   11/29/18 1256  Full code  Continuous     11/29/18 1255        Code Status History    Date Active Date Inactive Code Status Order ID Comments User Context   11/15/2017 2242 11/16/2017 2016 Full Code [id number]  Oralia Manis, MD Inpatient   09/16/2017 1534 09/18/2017 1440 Full Code [id number]  Alford Highland, MD ED           Consults none  DVT Prophylaxis Heparin Lab Results  Component Value Date   PLT 266 12/01/2018     Time  Spent in minutes   Greater than 50% of time spent in care coordination and counseling patient regarding the condition and plan of care.   Auburn Bilberry M.D on 12/01/2018 at 1:47 PM  Between 7am to 6pm - Pager - [phone]  After 6pm go to [email or web address] - Social research officer, government  [employer] [facility]   Office  [phone]

## 2018-12-01 NOTE — Progress Notes (Signed)
Report given to oncoming nurse at this time. Patient pulled out peripheral IV in left forearm.  Patient asleep in bed at this time. No acute distress noted.  Will continue to monitor.

## 2018-12-01 NOTE — Consult Note (Signed)
[facility] Face-to-Face Psychiatry Consult   Reason for Consult: History of schizophrenia in ICU with hypoxic respiratory failure and influenza Referring Physician:  Dr. Karna Christmas  Patient Identification: Paul Hernandez MRN:  [id number] Principal Diagnosis: Acute respiratory failure with hypoxia [facility]) Diagnosis:   Patient Active Problem List   Diagnosis Date Noted  . Acute respiratory failure with hypoxia (HCC) [J96.01] 11/29/2018  . Influenza A [J10.1] 11/29/2018  . Hyperlipidemia, unspecified [E78.5] 11/29/2017  . Hypertension [I10] 11/29/2017  . Schizophrenia (HCC) [F20.9] 11/16/2017  . COPD (chronic obstructive pulmonary disease) (HCC) [J44.9] 11/15/2017  . Mental health disorder [F99] 11/15/2017  . Cerebral ischemia [I67.82] 11/15/2017  . Cerebral infarct (HCC) [I63.9] 11/15/2017  . Benign essential HTN [I10] 11/13/2017  . Diabetes type 2, controlled (HCC) [E11.9] 11/13/2017  . COPD exacerbation (HCC) [J44.1] 09/16/2017    Total Time spent with patient: 45 minutes  Subjective: "I have been taking my medicine."  HPI:    Paul Hernandez is a 53 y.o. male patient admitted with  a known history of CHF, COPD, diabetes, hypertension, mental health disorder-lives in a group home and has 25 to 30% ejection fraction at baseline. Today he had shortness of breath and cough with flulike symptoms are sent to emergency room. Due to his baseline mental health disorder he is not able to give detailed history but he denies any chest pain associated with this. In ER he was noted to have acute respiratory failure with hypoxia due to influenza, started on BiPAP and oxygen saturation was stable on BiPAP. His troponin was also noted to be slightly elevated but his chest x-ray was clear and no EKG changes.  ER physician started on heparin IV drip.  Psychiatry consult is requested for review of psychiatric medication management.  Patient interviewed chart reviewed.  On evaluation, patient is calm and in  bed.  He is clearly having difficulty with breathing.  Patient reports "I got sick."  Patient endorses that he has been taking his medications as prescribed.  He states that he received his last Haldol Decanoate injection on Thursday to/03/2019.  Patient is agreeable to taking oral Haldol twice daily while in the ICU to decrease agitation.  Patient denies that he has had any difficulties while living in his group home.  He is denying any suicidal or homicidal ideation.  He is denying any active auditory or visual hallucinations at this time.  He is denying any mood symptoms, and requests more water and ice cream. No sign of any agitation or dangerousness.   Per record review: Social history: Reportedly has a guardian, however this is not clear. Nurses report that Misty Stanley and Jodi Geralds have been to visit patient.  He lives in a group home.    Substance abuse history: Nothing in the chart about it and the patient denies any alcohol or drug use current or past  Past Psychiatric History: No specific diagnosis accompanied the patient but it appears that he tells me as well that he is on a Haldol Decanoate shot that he receives every month.  Per chart review patient's outpatient psychiatrist is Dr. Elesa Massed.  He says he has had one prior psychiatric hospitalization at [facility] many years ago.  Cannot describe what symptoms he was having.  Denies any history of suicide attempts or violence.  Risk to Self:  None Risk to Others:  None Prior Inpatient Therapy:  [facility] many years ago, unclear of symptoms at time Prior Outpatient Therapy:  Follows with outpatient psychiatry and states he is  compliant with Haldol Decanoate.  Past Medical History:  Past Medical History:  Diagnosis Date  . CHF (congestive heart failure) (HCC)   . COPD (chronic obstructive pulmonary disease) (HCC)   . Diabetes mellitus without complication (HCC)   . Hypertension   . Mental health disorder     Past Surgical History:   Procedure Laterality Date  . NO PAST SURGERIES     Family History:  Family History  Problem Relation Age of Onset  . CVA Mother   . CAD Father    Family Psychiatric  History: Does not know of any Social History:  Social History   Substance and Sexual Activity  Alcohol Use No     Social History   Substance and Sexual Activity  Drug Use No    Social History   Socioeconomic History  . Marital status: Single    Spouse name: Not on file  . Number of children: Not on file  . Years of education: Not on file  . Highest education level: Not on file  Occupational History  . Not on file  Social Needs  . Financial resource strain: Not on file  . Food insecurity:    Worry: Not on file    Inability: Not on file  . Transportation needs:    Medical: Not on file    Non-medical: Not on file  Tobacco Use  . Smoking status: Current Some Day Smoker    Packs/day: 0.50    Types: Cigarettes  . Smokeless tobacco: Never Used  Substance and Sexual Activity  . Alcohol use: No  . Drug use: No  . Sexual activity: Not Currently  Lifestyle  . Physical activity:    Days per week: Not on file    Minutes per session: Not on file  . Stress: Not on file  Relationships  . Social connections:    Talks on phone: Not on file    Gets together: Not on file    Attends religious service: Not on file    Active member of club or organization: Not on file    Attends meetings of clubs or organizations: Not on file    Relationship status: Not on file  Other Topics Concern  . Not on file  Social History Narrative  . Not on file   Additional Social History:  Lives in a group home.  Has friends that were reportedly family friends who have cared for him and continued a relationship.  Misty Stanley and Jodi Geralds see demographics for contact information)  Allergies:   Allergies  Allergen Reactions  . Asa [Aspirin]     Labs:  Results for orders placed or performed during the hospital encounter of  11/29/18 (from the past 48 hour(s))  Glucose, capillary     Status: Abnormal   Collection Time: 11/29/18  5:01 PM  Result Value Ref Range   Glucose-Capillary 178 (H) 70 - 99 mg/dL   Comment 1 Notify RN    Comment 2 Document in Chart   MRSA PCR Screening     Status: None   Collection Time: 11/29/18  5:33 PM  Result Value Ref Range   MRSA by PCR NEGATIVE NEGATIVE    Comment:        The GeneXpert MRSA Assay (FDA approved for NASAL specimens only), is one component of a comprehensive MRSA colonization surveillance program. It is not intended to diagnose MRSA infection nor to guide or monitor treatment for MRSA infections. Performed at [facility], [address] [place], [place] [address]   Heparin level (unfractionated)     Status: None   Collection Time: 11/29/18  5:41 PM  Result Value Ref Range   Heparin Unfractionated 0.57 0.30 - 0.70 IU/mL    Comment: (NOTE) If heparin results are below expected values, and patient dosage has  been confirmed, suggest follow up testing of antithrombin III levels. Performed at [facility], [address] [place], [place] [address]   Troponin I - Now Then Q6H     Status: Abnormal   Collection Time: 11/29/18  5:41 PM  Result Value Ref Range   Troponin I 1.42 (HH) <0.03 ng/mL    Comment: CRITICAL VALUE NOTED. VALUE IS CONSISTENT WITH PREVIOUSLY REPORTED/CALLED VALUE.PMF Performed at [facility], [address] [place], [place] [address]   HIV antibody (Routine Testing)     Status: None   Collection Time: 11/29/18  5:41 PM  Result Value Ref Range   HIV Screen 4th Generation wRfx Non Reactive Non Reactive    Comment: (NOTE) Performed At: [facility] [address] [place], [place] [id number] Jolene Schimke MD UP:[id number]   CBC     Status: None   Collection Time: 11/29/18  5:41 PM  Result Value Ref Range   WBC 7.6 4.0 - 10.5 K/uL   RBC 4.67 4.22 - 5.81 MIL/uL   Hemoglobin 13.7 13.0 - 17.0  g/dL   HCT 92.9 24.4 - 62.8 %   MCV 94.6 80.0 - 100.0 fL   MCH 29.3 26.0 - 34.0 pg   MCHC 31.0 30.0 - 36.0 g/dL   RDW 63.8 17.7 - 11.6 %   Platelets 246 150 - 400 K/uL   nRBC 0.0 0.0 - 0.2 %    Comment: Performed at [facility], [address] [place], [place] [address]  Creatinine, serum     Status: Abnormal   Collection Time: 11/29/18  5:41 PM  Result Value Ref Range   Creatinine, Ser 1.59 (H) 0.61 - 1.24 mg/dL   GFR calc non Af Amer 50 (L) >60 mL/min   GFR calc Af Amer 57 (L) >60 mL/min    Comment: Performed at [facility] [address] [place], [place] [address]  Draw ABG 1 hour after initiation of ventilator     Status: Abnormal   Collection Time: 11/29/18  7:47 PM  Result Value Ref Range   FIO2 100.00    Delivery systems VENTILATOR    Mode PRESSURE REGULATED VOLUME CONTROL    VT 500 mL   Peep/cpap 5.0 cm H20   pH, Arterial 7.37 7.350 - 7.450   pCO2 arterial 64 (H) 32.0 - 48.0 mmHg   pO2, Arterial 363 (H) 83.0 - 108.0 mmHg   Bicarbonate 37.0 (H) 20.0 - 28.0 mmol/L   Acid-Base Excess 9.2 (H) 0.0 - 2.0 mmol/L   O2 Saturation 99.9 %   Patient temperature 37.0    Collection site RIGHT RADIAL    Sample type ARTERIAL DRAW    Allens test (pass/fail) PASS PASS   Mechanical Rate 20     Comment: Performed at [facility], [address] [place], [place] [address]  Triglycerides     Status: None   Collection Time: 11/29/18  8:51 PM  Result Value Ref Range   Triglycerides 37 <150 mg/dL    Comment: Performed at [facility], [address], [place], [place] [address]  CBC     Status: None   Collection Time: 11/29/18  8:51 PM  Result Value Ref Range   WBC 6.0 4.0 - 10.5 K/uL   RBC 4.53 4.22 - 5.81 MIL/uL   Hemoglobin 13.3 13.0 - 17.0 g/dL   HCT 16.1 09.6 - 04.5 %   MCV 96.7 80.0 - 100.0 fL   MCH 29.4 26.0 - 34.0 pg   MCHC 30.4 30.0 - 36.0 g/dL   RDW 40.9 81.1 - 91.4 %   Platelets 269 150 - 400 K/uL   nRBC 0.0 0.0 - 0.2 %     Comment: Performed at [facility], [address] [place], [place] [address]  Comprehensive metabolic panel     Status: Abnormal   Collection Time: 11/29/18  8:51 PM  Result Value Ref Range   Sodium 142 135 - 145 mmol/L   Potassium 5.9 (H) 3.5 - 5.1 mmol/L   Chloride 104 98 - 111 mmol/L   CO2 32 22 - 32 mmol/L   Glucose, Bld 105 (H) 70 - 99 mg/dL   BUN 37 (H) 6 - 20 mg/dL   Creatinine, Ser 6.21 (H) 0.61 - 1.24 mg/dL   Calcium 7.7 (L) 8.9 - 10.3 mg/dL   Total Protein 5.4 (L) 6.5 - 8.1 g/dL   Albumin 3.1 (L) 3.5 - 5.0 g/dL   AST 308 (H) 15 - 41 U/L   ALT 154 (H) 0 - 44 U/L   Alkaline Phosphatase 49 38 - 126 U/L   Total Bilirubin 0.9 0.3 - 1.2 mg/dL   GFR calc non Af Amer 35 (L) >60 mL/min   GFR calc Af Amer 41 (L) >60 mL/min   Anion gap 6 5 - 15    Comment: Performed at [facility], [address] [place], [place] [address]  Magnesium     Status: Abnormal   Collection Time: 11/29/18  8:51 PM  Result Value Ref Range   Magnesium 2.5 (H) 1.7 - 2.4 mg/dL    Comment: Performed at [facility], [address] [place], [place] [address]  Phosphorus     Status: Abnormal   Collection Time: 11/29/18  8:51 PM  Result Value Ref Range   Phosphorus 9.2 (H) 2.5 - 4.6 mg/dL    Comment: Performed at [facility], [address] [place], [place] [address]  Protime-INR     Status: Abnormal   Collection Time: 11/29/18  8:51 PM  Result Value Ref Range   Prothrombin Time 16.1 (H) 11.4 - 15.2 seconds   INR 1.30     Comment: Performed at [facility], [address] [place], [place] [address]  Troponin I - ONCE - STAT     Status: Abnormal   Collection Time: 11/29/18  8:51 PM  Result Value Ref Range   Troponin I 1.62 (HH) <0.03 ng/mL    Comment: CRITICAL RESULT CALLED TO, READ BACK BY AND VERIFIED WITH HIRAL PATEL AT 2137 11/29/2018.  TFK Performed at [facility], [address] [place], [place] [address]   Procalcitonin -  Baseline     Status: None   Collection Time: 11/29/18  8:51 PM  Result Value Ref Range   Procalcitonin 1.51 ng/mL    Comment:        Interpretation: PCT > 0.5 ng/mL and <= 2 ng/mL: Systemic infection (sepsis) is possible, but other conditions are known to elevate PCT as well. (NOTE)       Sepsis PCT Algorithm           Lower Respiratory Tract  Infection PCT Algorithm    ----------------------------     ----------------------------         PCT < 0.25 ng/mL                PCT < 0.10 ng/mL         Strongly encourage             Strongly discourage   discontinuation of antibiotics    initiation of antibiotics    ----------------------------     -----------------------------       PCT 0.25 - 0.50 ng/mL            PCT 0.10 - 0.25 ng/mL               OR       >80% decrease in PCT            Discourage initiation of                                            antibiotics      Encourage discontinuation           of antibiotics    ----------------------------     -----------------------------         PCT >= 0.50 ng/mL              PCT 0.26 - 0.50 ng/mL                AND       <80% decrease in PCT             Encourage initiation of                                             antibiotics       Encourage continuation           of antibiotics    ----------------------------     -----------------------------        PCT >= 0.50 ng/mL                  PCT > 0.50 ng/mL               AND         increase in PCT                  Strongly encourage                                      initiation of antibiotics    Strongly encourage escalation           of antibiotics                                     -----------------------------                                           PCT <= 0.25 ng/mL  OR                                        > 80% decrease in PCT                                     Discontinue / Do not  initiate                                             antibiotics Performed at [facility], [address] [place], [place] [address]   Lactic acid, plasma     Status: Abnormal   Collection Time: 11/29/18  8:52 PM  Result Value Ref Range   Lactic Acid, Venous 2.0 (HH) 0.5 - 1.9 mmol/L    Comment: CRITICAL RESULT CALLED TO, READ BACK BY AND VERIFIED WITH HIRAL PATEL AT 2137 11/29/2018.  TFK Performed at [facility], [address] [place], [place] [address]   Glucose, capillary     Status: Abnormal   Collection Time: 11/29/18 10:28 PM  Result Value Ref Range   Glucose-Capillary 103 (H) 70 - 99 mg/dL  Urine Drug Screen, Qualitative ([facility] only)     Status: Abnormal   Collection Time: 11/30/18  2:10 AM  Result Value Ref Range   Tricyclic, Ur Screen NONE DETECTED NONE DETECTED   Amphetamines, Ur Screen NONE DETECTED NONE DETECTED   MDMA (Ecstasy)Ur Screen NONE DETECTED NONE DETECTED   Cocaine Metabolite,Ur [place] NONE DETECTED NONE DETECTED   Opiate, Ur Screen NONE DETECTED NONE DETECTED   Phencyclidine (PCP) Ur S NONE DETECTED NONE DETECTED   Cannabinoid 50 Ng, Ur [place] NONE DETECTED NONE DETECTED   Barbiturates, Ur Screen NONE DETECTED NONE DETECTED   Benzodiazepine, Ur Scrn POSITIVE (A) NONE DETECTED   Methadone Scn, Ur NONE DETECTED NONE DETECTED    Comment: (NOTE) Tricyclics + metabolites, urine    Cutoff 1000 ng/mL Amphetamines + metabolites, urine  Cutoff 1000 ng/mL MDMA (Ecstasy), urine              Cutoff 500 ng/mL Cocaine Metabolite, urine          Cutoff 300 ng/mL Opiate + metabolites, urine        Cutoff 300 ng/mL Phencyclidine (PCP), urine         Cutoff 25 ng/mL Cannabinoid, urine                 Cutoff 50 ng/mL Barbiturates + metabolites, urine  Cutoff 200 ng/mL Benzodiazepine, urine              Cutoff 200 ng/mL Methadone, urine                   Cutoff 300 ng/mL The urine drug screen provides only a preliminary, unconfirmed analytical test result  and should not be used for non-medical purposes. Clinical consideration and professional judgment should be applied to any positive drug screen result due to possible interfering substances. A more specific alternate chemical method must be used in order to obtain a confirmed analytical result. Gas chromatography / mass spectrometry (GC/MS) is the preferred confirmat ory method. Performed at [facility], [address] [place], [place] [address]  Basic metabolic panel     Status: Abnormal   Collection Time: 11/30/18  5:14 AM  Result Value Ref Range   Sodium 143 135 - 145 mmol/L   Potassium 3.9 3.5 - 5.1 mmol/L   Chloride 107 98 - 111 mmol/L   CO2 29 22 - 32 mmol/L   Glucose, Bld 175 (H) 70 - 99 mg/dL   BUN 38 (H) 6 - 20 mg/dL   Creatinine, Ser 5.40 (H) 0.61 - 1.24 mg/dL   Calcium 7.4 (L) 8.9 - 10.3 mg/dL   GFR calc non Af Amer 50 (L) >60 mL/min   GFR calc Af Amer 57 (L) >60 mL/min   Anion gap 7 5 - 15    Comment: Performed at [facility], [address] [place], [place] [address]  CBC     Status: Abnormal   Collection Time: 11/30/18  5:14 AM  Result Value Ref Range   WBC 13.9 (H) 4.0 - 10.5 K/uL   RBC 4.53 4.22 - 5.81 MIL/uL   Hemoglobin 13.1 13.0 - 17.0 g/dL   HCT 14.7 82.9 - 56.2 %   MCV 92.7 80.0 - 100.0 fL   MCH 28.9 26.0 - 34.0 pg   MCHC 31.2 30.0 - 36.0 g/dL   RDW 13.0 86.5 - 78.4 %   Platelets 256 150 - 400 K/uL   nRBC 0.0 0.0 - 0.2 %    Comment: Performed at [facility], [address] [place], [place] [address]  Procalcitonin     Status: None   Collection Time: 11/30/18  5:14 AM  Result Value Ref Range   Procalcitonin 0.84 ng/mL    Comment:        Interpretation: PCT > 0.5 ng/mL and <= 2 ng/mL: Systemic infection (sepsis) is possible, but other conditions are known to elevate PCT as well. (NOTE)       Sepsis PCT Algorithm           Lower Respiratory Tract                                      Infection PCT Algorithm     ----------------------------     ----------------------------         PCT < 0.25 ng/mL                PCT < 0.10 ng/mL         Strongly encourage             Strongly discourage   discontinuation of antibiotics    initiation of antibiotics    ----------------------------     -----------------------------       PCT 0.25 - 0.50 ng/mL            PCT 0.10 - 0.25 ng/mL               OR       >80% decrease in PCT            Discourage initiation of                                            antibiotics      Encourage discontinuation           of antibiotics    ----------------------------     -----------------------------  PCT >= 0.50 ng/mL              PCT 0.26 - 0.50 ng/mL                AND       <80% decrease in PCT             Encourage initiation of                                             antibiotics       Encourage continuation           of antibiotics    ----------------------------     -----------------------------        PCT >= 0.50 ng/mL                  PCT > 0.50 ng/mL               AND         increase in PCT                  Strongly encourage                                      initiation of antibiotics    Strongly encourage escalation           of antibiotics                                     -----------------------------                                           PCT <= 0.25 ng/mL                                                 OR                                        > 80% decrease in PCT                                     Discontinue / Do not initiate                                             antibiotics Performed at [facility], [address] [place], [place] [address]   Lactic acid, plasma     Status: None   Collection Time: 11/30/18  5:14 AM  Result Value Ref Range   Lactic Acid, Venous 1.0 0.5 - 1.9 mmol/L    Comment: Performed at [facility], [address], [place], [place] [address]  Glucose, capillary  Status:  Abnormal   Collection Time: 11/30/18  7:24 AM  Result Value Ref Range   Glucose-Capillary 155 (H) 70 - 99 mg/dL  Heparin level (unfractionated)     Status: None   Collection Time: 11/30/18  9:12 AM  Result Value Ref Range   Heparin Unfractionated 0.32 0.30 - 0.70 IU/mL    Comment: (NOTE) If heparin results are below expected values, and patient dosage has  been confirmed, suggest follow up testing of antithrombin III levels. Performed at [facility], [address] [place], [place] [address]   Hepatic function panel     Status: Abnormal   Collection Time: 11/30/18  9:12 AM  Result Value Ref Range   Total Protein 5.0 (L) 6.5 - 8.1 g/dL   Albumin 2.7 (L) 3.5 - 5.0 g/dL   AST 564446 (H) 15 - 41 U/L   ALT 301 (H) 0 - 44 U/L   Alkaline Phosphatase 47 38 - 126 U/L   Total Bilirubin 0.9 0.3 - 1.2 mg/dL   Bilirubin, Direct 0.3 (H) 0.0 - 0.2 mg/dL   Indirect Bilirubin 0.6 0.3 - 0.9 mg/dL    Comment: Performed at [facility], [address] [place], [place] [address]  Glucose, capillary     Status: Abnormal   Collection Time: 11/30/18 12:04 PM  Result Value Ref Range   Glucose-Capillary 160 (H) 70 - 99 mg/dL  Heparin level (unfractionated)     Status: None   Collection Time: 11/30/18  2:28 PM  Result Value Ref Range   Heparin Unfractionated 0.53 0.30 - 0.70 IU/mL    Comment: (NOTE) If heparin results are below expected values, and patient dosage has  been confirmed, suggest follow up testing of antithrombin III levels. Performed at [facility], [address] [place], [place] [address]   Glucose, capillary     Status: Abnormal   Collection Time: 11/30/18  3:56 PM  Result Value Ref Range   Glucose-Capillary 183 (H) 70 - 99 mg/dL  Basic metabolic panel     Status: Abnormal   Collection Time: 11/30/18  9:55 PM  Result Value Ref Range   Sodium 142 135 - 145 mmol/L   Potassium 4.2 3.5 - 5.1 mmol/L   Chloride 106 98 - 111 mmol/L   CO2 31 22 - 32  mmol/L   Glucose, Bld 185 (H) 70 - 99 mg/dL   BUN 43 (H) 6 - 20 mg/dL   Creatinine, Ser 6.601.56 (H) 0.61 - 1.24 mg/dL   Calcium 7.2 (L) 8.9 - 10.3 mg/dL   GFR calc non Af Amer 51 (L) >60 mL/min   GFR calc Af Amer 59 (L) >60 mL/min   Anion gap 5 5 - 15    Comment: Performed at [facility], [address] [place], [place] [address]  Magnesium     Status: None   Collection Time: 11/30/18  9:55 PM  Result Value Ref Range   Magnesium 2.3 1.7 - 2.4 mg/dL    Comment: Performed at [facility], [address] [place], [place] [address]  Phosphorus     Status: None   Collection Time: 11/30/18  9:55 PM  Result Value Ref Range   Phosphorus 4.3 2.5 - 4.6 mg/dL    Comment: Performed at [facility], [address] [place], [place] [address]  Glucose, capillary     Status: Abnormal   Collection Time: 11/30/18 10:54 PM  Result Value Ref Range   Glucose-Capillary 145 (H) 70 -  99 mg/dL  Procalcitonin     Status: None   Collection Time: 12/01/18  4:43 AM  Result Value Ref Range   Procalcitonin 1.14 ng/mL    Comment:        Interpretation: PCT > 0.5 ng/mL and <= 2 ng/mL: Systemic infection (sepsis) is possible, but other conditions are known to elevate PCT as well. (NOTE)       Sepsis PCT Algorithm           Lower Respiratory Tract                                      Infection PCT Algorithm    ----------------------------     ----------------------------         PCT < 0.25 ng/mL                PCT < 0.10 ng/mL         Strongly encourage             Strongly discourage   discontinuation of antibiotics    initiation of antibiotics    ----------------------------     -----------------------------       PCT 0.25 - 0.50 ng/mL            PCT 0.10 - 0.25 ng/mL               OR       >80% decrease in PCT            Discourage initiation of                                            antibiotics      Encourage discontinuation           of antibiotics     ----------------------------     -----------------------------         PCT >= 0.50 ng/mL              PCT 0.26 - 0.50 ng/mL                AND       <80% decrease in PCT             Encourage initiation of                                             antibiotics       Encourage continuation           of antibiotics    ----------------------------     -----------------------------        PCT >= 0.50 ng/mL                  PCT > 0.50 ng/mL               AND         increase in PCT                  Strongly encourage  initiation of antibiotics    Strongly encourage escalation           of antibiotics                                     -----------------------------                                           PCT <= 0.25 ng/mL                                                 OR                                        > 80% decrease in PCT                                     Discontinue / Do not initiate                                             antibiotics Performed at [facility], [address] [place], [place] [address]   Heparin level (unfractionated)     Status: Abnormal   Collection Time: 12/01/18  4:43 AM  Result Value Ref Range   Heparin Unfractionated 0.74 (H) 0.30 - 0.70 IU/mL    Comment: (NOTE) If heparin results are below expected values, and patient dosage has  been confirmed, suggest follow up testing of antithrombin III levels. Performed at [facility], [address] [place], [place] [address]   Basic metabolic panel     Status: Abnormal   Collection Time: 12/01/18  4:43 AM  Result Value Ref Range   Sodium 140 135 - 145 mmol/L   Potassium 4.3 3.5 - 5.1 mmol/L   Chloride 105 98 - 111 mmol/L   CO2 30 22 - 32 mmol/L   Glucose, Bld 191 (H) 70 - 99 mg/dL   BUN 48 (H) 6 - 20 mg/dL   Creatinine, Ser 0.98 (H) 0.61 - 1.24 mg/dL   Calcium 7.4 (L) 8.9 - 10.3 mg/dL   GFR calc non Af Amer 45 (L) >60 mL/min   GFR  calc Af Amer 53 (L) >60 mL/min   Anion gap 5 5 - 15    Comment: Performed at [facility], [address] [place], [place] [address]  CBC     Status: Abnormal   Collection Time: 12/01/18  4:43 AM  Result Value Ref Range   WBC 18.9 (H) 4.0 - 10.5 K/uL   RBC 4.71 4.22 - 5.81 MIL/uL   Hemoglobin 13.6 13.0 - 17.0 g/dL   HCT 78.2 95.6 - 21.3 %   MCV 92.8 80.0 - 100.0 fL   MCH 28.9 26.0 - 34.0 pg   MCHC 31.1 30.0 - 36.0 g/dL   RDW 08.6 57.8 - 46.9 %   Platelets 266 150 -  400 K/uL   nRBC 0.0 0.0 - 0.2 %    Comment: Performed at [facility], [address] [place], [place] [address]  Magnesium     Status: None   Collection Time: 12/01/18  4:43 AM  Result Value Ref Range   Magnesium 2.4 1.7 - 2.4 mg/dL    Comment: Performed at [facility], [address] [place], [place] [address]  Phosphorus     Status: Abnormal   Collection Time: 12/01/18  4:43 AM  Result Value Ref Range   Phosphorus 5.0 (H) 2.5 - 4.6 mg/dL    Comment: Performed at [facility], [address] [place], [place] [address]  Glucose, capillary     Status: Abnormal   Collection Time: 12/01/18  8:45 AM  Result Value Ref Range   Glucose-Capillary 196 (H) 70 - 99 mg/dL  Glucose, capillary     Status: Abnormal   Collection Time: 12/01/18 11:57 AM  Result Value Ref Range   Glucose-Capillary 178 (H) 70 - 99 mg/dL    Current Facility-Administered Medications  Medication Dose Route Frequency Provider Last Rate Last Dose  . 0.9 %  sodium chloride infusion   Intravenous PRN Tukov-Yual, Alroy Bailiff, NP   Stopped at 11/30/18 0045  . aspirin EC tablet 325 mg  325 mg Oral Daily Altamese Dilling, MD   325 mg at 12/01/18 0911  . benztropine (COGENTIN) tablet 2 mg  2 mg Oral QHS Altamese Dilling, MD   2 mg at 11/30/18 2301  . bisacodyl (DULCOLAX) suppository 10 mg  10 mg Rectal Daily PRN Tukov-Yual, Magdalene S, NP      . budesonide (PULMICORT) nebulizer solution 0.5 mg  0.5 mg  Nebulization BID Altamese Dilling, MD   0.5 mg at 12/01/18 0824  . carvedilol (COREG) tablet 25 mg  25 mg Oral BID WC Altamese Dilling, MD      . dexmedetomidine (PRECEDEX) 400 MCG/100ML (4 mcg/mL) infusion  0.4-1.2 mcg/kg/hr Intravenous Titrated Tukov-Yual, Magdalene S, NP 4.99 mL/hr at 12/01/18 1000 0.2 mcg/kg/hr at 12/01/18 1000  . diphenhydrAMINE (BENADRYL) capsule 25 mg  25 mg Oral QHS Altamese Dilling, MD   25 mg at 11/30/18 2301  . docusate sodium (COLACE) capsule 100 mg  100 mg Oral BID PRN Altamese Dilling, MD      . DOPamine (INTROPIN) 800 mg in dextrose 5 % 250 mL (3.2 mg/mL) infusion  0-20 mcg/kg/min Intravenous Titrated Tukov-Yual, Alroy Bailiff, NP   Stopped at 11/30/18 1337  . ezetimibe (ZETIA) tablet 10 mg  10 mg Oral Daily Altamese Dilling, MD   10 mg at 12/01/18 0911  . fentaNYL in NS (62mcg/ml) infusion-PREMIX  0-400 mcg/hr Intravenous Continuous Tukov-Yual, Magdalene S, NP   Stopped at 11/30/18 1330  . fluticasone (FLONASE) 50 MCG/ACT nasal spray 1 spray  1 spray Each Nare Daily Altamese Dilling, MD   1 spray at 12/01/18 0912  . haloperidol (HALDOL) tablet 5 mg  5 mg Oral BID Mariel Craft, MD      . heparin ADULT infusion 100 units/mL (25000 units/245mL sodium chloride 0.45%)  1,100 Units/hr Intravenous Continuous Emily Filbert, MD 11 mL/hr at 12/01/18 1110 1,100 Units/hr at 12/01/18 1110  . insulin aspart (novoLOG) injection 0-5 Units  0-5 Units Subcutaneous QHS Altamese Dilling, MD      . insulin aspart (novoLOG) injection 0-9 Units  0-9 Units Subcutaneous TID WC Altamese Dilling, MD   2 Units at 12/01/18 1202  . ipratropium-albuterol (DUONEB) 0.5-2.5 (3) MG/3ML  nebulizer solution 3 mL  3 mL Nebulization Q4H Altamese DillingVachhani, Vaibhavkumar, MD   3 mL at 12/01/18 0824  . isosorbide-hydrALAZINE (BIDIL) 20-37.5 MG per tablet 1 tablet  1 tablet Oral TID Altamese DillingVachhani, Vaibhavkumar, MD      . losartan (COZAAR) tablet 50 mg  50  mg Oral Daily Altamese DillingVachhani, Vaibhavkumar, MD   50 mg at 11/29/18 1443  . MEDLINE mouth rinse  15 mL Mouth Rinse BID Vida RiggerAleskerov, Fuad, MD   15 mL at 11/30/18 2310  . methylPREDNISolone sodium succinate (SOLU-MEDROL) 125 mg/2 mL injection 60 mg  60 mg Intravenous Q6H Altamese DillingVachhani, Vaibhavkumar, MD   60 mg at 12/01/18 1202  . midazolam (VERSED) injection 2-4 mg  2-4 mg Intravenous Q15 min PRN Tukov-Yual, Magdalene S, NP   2 mg at 11/29/18 2310  . midazolam (VERSED) injection 2-4 mg  2-4 mg Intravenous Q2H PRN Tukov-Yual, Magdalene S, NP      . nitroGLYCERIN (NITROSTAT) SL tablet 0.4 mg  0.4 mg Sublingual Q5 min PRN Altamese DillingVachhani, Vaibhavkumar, MD      . norepinephrine (LEVOPHED) 16 mg in 250mL premix infusion  0-65 mcg/min Intravenous Titrated Tukov-Yual, Alroy BailiffMagdalene S, NP   Stopped at 11/30/18 0445  . oseltamivir (TAMIFLU) capsule 75 mg  75 mg Oral BID Altamese DillingVachhani, Vaibhavkumar, MD   75 mg at 12/01/18 0911  . pantoprazole (PROTONIX) injection 40 mg  40 mg Intravenous Daily Tukov-Yual, Magdalene S, NP   40 mg at 12/01/18 0910  . piperacillin-tazobactam (ZOSYN) IVPB 3.375 g  3.375 g Intravenous Q8H Tukov-Yual, Magdalene S, NP 12.5 mL/hr at 12/01/18 0902 3.375 g at 12/01/18 0902  . propofol (DIPRIVAN) 1000 MG/100ML infusion  0-50 mcg/kg/min Intravenous Continuous Tukov-Yual, Alroy BailiffMagdalene S, NP   Stopped at 11/29/18 2000  . rosuvastatin (CRESTOR) tablet 40 mg  40 mg Oral Daily Altamese DillingVachhani, Vaibhavkumar, MD   40 mg at 12/01/18 0911  . sennosides (SENOKOT) 8.8 MG/5ML syrup 5 mL  5 mL Per Tube BID PRN Tukov-Yual, Magdalene S, NP      . sodium chloride flush (NS) 0.9 % injection 10-40 mL  10-40 mL Intracatheter Q12H Tukov-Yual, Magdalene S, NP   10 mL at 12/01/18 0910  . sodium chloride flush (NS) 0.9 % injection 10-40 mL  10-40 mL Intracatheter PRN Tukov-Yual, Magdalene S, NP      . tiotropium (SPIRIVA) inhalation capsule ([facility] use ONLY) 18 mcg  18 mcg Inhalation Daily Altamese DillingVachhani, Vaibhavkumar, MD   18 mcg at 12/01/18 0913  .  vancomycin (VANCOCIN) 1,250 mg in sodium chloride 0.9 % 250 mL IVPB  1,250 mg Intravenous Q12H Tukov-Yual, Magdalene S, NP 166.7 mL/hr at 12/01/18 1000      Musculoskeletal: Strength & Muscle Tone: within normal limits Gait & Station: normal Patient leans: N/A  Psychiatric Specialty Exam: Physical Exam  Nursing note and vitals reviewed. Constitutional: He appears well-developed and well-nourished.  HENT:  Head: Normocephalic and atraumatic.  Eyes: Pupils are equal, round, and reactive to light. Conjunctivae are normal.  Neck: Normal range of motion.  Cardiovascular: Regular rhythm and normal heart sounds.  Respiratory: Effort normal. No respiratory distress.  GI: Soft.  Musculoskeletal: Normal range of motion.  Neurological: He is alert.  Skin: Skin is warm and dry.  Psychiatric: His behavior is normal. His speech is tangential. Thought content is not paranoid. Cognition and memory are impaired. He expresses no homicidal and no suicidal ideation.    Review of Systems  Constitutional: Negative.   HENT: Negative.   Eyes: Negative.   Respiratory: Positive for  cough and shortness of breath.   Cardiovascular: Positive for leg swelling. Negative for chest pain and palpitations.  Gastrointestinal: Negative.   Musculoskeletal: Negative.   Skin: Negative.   Neurological: Negative.   Psychiatric/Behavioral: Negative.     Blood pressure (!) 107/94, pulse 71, temperature 97.7 F (36.5 C), temperature source Oral, resp. rate 19, height 6' (1.829 m), weight 99.8 kg, SpO2 97 %.Body mass index is 29.84 kg/m.  General Appearance: Ill-appearing  Eye Contact:  Good  Speech:  Garbled  Volume:  Decreased  Mood:  Euthymic  Affect:  Appropriate  Thought Process:  Goal Directed  Orientation:  Full (Time, Place, and Person)  Thought Content:  Hallucinations: None, Rumination, Tangential and Perseverative on food  Suicidal Thoughts:  No  Homicidal Thoughts:  No  Memory:  Immediate;    Good Recent;   Fair Remote;   Fair  Judgement:  Fair  Insight:  Fair  Psychomotor Activity:  Normal  Concentration:  Concentration: Fair  Recall:  [employer] of Knowledge:  Fair  Language:  Fair  Akathisia:  No  Handed:  Right  AIMS (if indicated):     Assets:  Desire for Improvement Housing Resilience  ADL's:  Intact  Cognition:  Impaired,  Mild  Sleep:        Treatment Plan Summary: Plan This is a 53 y.o. man who presented with hypoxic respiratory failure and a history of schizophrenia.  Per review of records, his schizophrenia appears to be back to his baseline.  Collateral from group home is suggested to assess for any recent changes in behavior at group home.  He has no psychiatric complaints, and reports medication compliance as an outpatient.  His current mental state would be consistent with well-controlled schizophrenia or possibly mild developmental disability as well.  Patient shows no signs of dangerousness.  Does not require inpatient treatment.  Already gets a long-acting shot so he does not need any change or addition to any antipsychotic medication, however while inpatient in the ICU will recommend Haldol 5 mg p.o. twice daily to help decrease chance of agitation and delirium.  Patient can be discharged back to his group home and follow-up in the community.   Disposition: No evidence of imminent risk to self or others at present.   Patient does not meet criteria for psychiatric inpatient admission.  Will need collateral to determine if patient has a guardian for medical decision-making and follow-up plans.  Appreciate social work support in Psychologist, occupational for discharge planning.  Mariel Craft, MD 12/01/2018 12:32 PM

## 2018-12-01 NOTE — Progress Notes (Signed)
ANTICOAGULATION CONSULT NOTE   Pharmacy Consult for Heparin Dosing Indication: chest pain/ACS  Allergies  Allergen Reactions  . Asa [Aspirin]     Patient Measurements: Height: 6' (182.9 cm) Weight: 220 lb (99.8 kg) IBW/kg (Calculated) : 77.6 Heparin Dosing Weight: 97.8  Vital Signs: Temp: 96.5 F (35.8 C) (02/09 0400) Temp Source: Axillary (02/09 0400) BP: 96/72 (02/09 0000) Pulse Rate: 61 (02/09 0200)  Labs: Recent Labs    11/29/18 0739 11/29/18 1019  11/29/18 1741 11/29/18 2051 11/30/18 0514 11/30/18 0912 11/30/18 1428 11/30/18 2155 12/01/18 0443  HGB 14.7  --   --  13.7 13.3 13.1  --   --   --  13.6  HCT 46.5  --   --  44.2 43.8 42.0  --   --   --  43.7  PLT 308  --   --  246 269 256  --   --   --  266  APTT 30  --   --   --   --   --   --   --   --   --   LABPROT 15.6*  --   --   --  16.1*  --   --   --   --   --   INR 1.25  --   --   --  1.30  --   --   --   --   --   HEPARINUNFRC  --   --    < > 0.57  --   --  0.32 0.53  --  0.74*  CREATININE 1.44*  --   --  1.59* 2.11* 1.59*  --   --  1.56* 1.71*  TROPONINI 0.60* 0.74*  --  1.42* 1.62*  --   --   --   --   --    < > = values in this interval not displayed.    Estimated Creatinine Clearance: 62.5 mL/min (A) (by C-G formula based on SCr of 1.71 mg/dL (H)).   Medical History: Past Medical History:  Diagnosis Date  . CHF (congestive heart failure) (HCC)   . COPD (chronic obstructive pulmonary disease) (HCC)   . Diabetes mellitus without complication (HCC)   . Hypertension   . Mental health disorder    Assessment:  02/07 1741  HL= 0.57. Will continue current drip rate and check confirmatory level in 6 hours.  02/08 @ 0240:  Heparin was d/c'd earlier on 2/7 around 1800 due to possible concern that pt had a CVA .  Pt returned from CT with no evidence of CVA.  Will restart heparin at previous rate @ 02/08 @ 0300.  Will recheck HL 6 hrs after restart.    02/08 ~ 09:00  HL = 0.32.  Continue current dosing.  Recheck HL today at 15:00.   02/08 1429 HL = 0.53. Continue current dosing.   2/6 AM heparin level 0.74, Decrease to 1100 units/hr and recheck in 6 hours.  Goal of Therapy:  Heparin level 0.3-0.7 units/ml Monitor platelets by anticoagulation protocol: Yes   Plan:  Will continue current rate. Will order heparin level with AM labs.     Aissa Lisowski S, PharmD, BCPS  12/01/2018,6:59 AM

## 2018-12-01 NOTE — Plan of Care (Signed)
Pt able to reposition in bed, O2 sats adequate on 3 liters supp O2 [place], remains with soft pressures, MAP>65.  HE is able to communicate needs, can be repetitive but is easily redirected.  Stridor no longer heard in neck but patient with pronounced DOE.

## 2018-12-01 NOTE — Progress Notes (Signed)
CRITICAL CARE NOTE      CHIEF COMPLAINT:   Acute hypoxic respiratory failure secondary to influenza A   SUBJECTIVE FINDINGS & SIGNIFICANT EVENTS   Resting in bed comfortably, no new complaints.  Overnight events noted. Discussed case with psychiatry, comprehensive evaluation pending   PAST MEDICAL HISTORY   Past Medical History:  Diagnosis Date  . CHF (congestive heart failure) ([place])   . COPD (chronic obstructive pulmonary disease) ([place])   . Diabetes mellitus without complication ([place])   . Hypertension   . Mental health disorder      SURGICAL HISTORY   Past Surgical History:  Procedure Laterality Date  . NO PAST SURGERIES       FAMILY HISTORY   Family History  Problem Relation Age of Onset  . CVA Mother   . CAD Father      SOCIAL HISTORY   Social History   Tobacco Use  . Smoking status: Current Some Day Smoker    Packs/day: 0.50    Types: Cigarettes  . Smokeless tobacco: Never Used  Substance Use Topics  . Alcohol use: No  . Drug use: No     MEDICATIONS   Current Medication:  Current Facility-Administered Medications:  .  0.9 %  sodium chloride infusion, , Intravenous, PRN, Tukov-Yual, Magdalene S, NP, Stopped at 11/30/18 0045 .  aspirin EC tablet 325 mg, 325 mg, Oral, Daily, Vaughan Basta, MD, 325 mg at 12/01/18 0911 .  benztropine (COGENTIN) tablet 2 mg, 2 mg, Oral, QHS, Vaughan Basta, MD, 2 mg at 11/30/18 2301 .  bisacodyl (DULCOLAX) suppository 10 mg, 10 mg, Rectal, Daily PRN, Tukov-Yual, Magdalene S, NP .  budesonide (PULMICORT) nebulizer solution 0.5 mg, 0.5 mg, Nebulization, BID, Vaughan Basta, MD, 0.5 mg at 12/01/18 0824 .  carvedilol (COREG) tablet 25 mg, 25 mg, Oral, BID WC, Vaughan Basta, MD .  dexmedetomidine (PRECEDEX) 400 MCG/100ML (4 mcg/mL) infusion, 0.4-1.2 mcg/kg/hr, Intravenous, Titrated, Tukov-Yual, Magdalene S, NP, Last Rate: 4.99 mL/hr at 12/01/18 1000, 0.2 mcg/kg/hr at 12/01/18  1000 .  diphenhydrAMINE (BENADRYL) capsule 25 mg, 25 mg, Oral, QHS, Vaughan Basta, MD, 25 mg at 11/30/18 2301 .  docusate sodium (COLACE) capsule 100 mg, 100 mg, Oral, BID PRN, Vaughan Basta, MD .  DOPamine (INTROPIN) 800 mg in dextrose 5 % 250 mL (3.2 mg/mL) infusion, 0-20 mcg/kg/min, Intravenous, Titrated, Tukov-Yual, Magdalene S, NP, Stopped at 11/30/18 1337 .  ezetimibe (ZETIA) tablet 10 mg, 10 mg, Oral, Daily, Vaughan Basta, MD, 10 mg at 12/01/18 0911 .  fentaNYL 2567mg in NS 257m(1036mml) infusion-PREMIX, 0-400 mcg/hr, Intravenous, Continuous, Tukov-Yual, Magdalene S, NP, Stopped at 11/30/18 1330 .  fluticasone (FLONASE) 50 MCG/ACT nasal spray 1 spray, 1 spray, Each Nare, Daily, VacVaughan BastaD, 1 spray at 12/01/18 0912 .  haloperidol (HALDOL) tablet 5 mg, 5 mg, Oral, BID, MauLavella HammockD .  heparin ADULT infusion 100 units/mL (25000 units/250m73mdium chloride 0.45%), 1,100 Units/hr, Intravenous, Continuous, WillEarleen [place], Last Rate: 11 mL/hr at 12/01/18 1110, 1,100 Units/hr at 12/01/18 1110 .  insulin aspart (novoLOG) injection 0-5 Units, 0-5 Units, Subcutaneous, QHS, VachVaughan Basta .  insulin aspart (novoLOG) injection 0-9 Units, 0-9 Units, Subcutaneous, TID WC, VachVaughan Basta, 2 Units at 12/01/18 1202 .  ipratropium-albuterol (DUONEB) 0.5-2.5 (3) MG/3ML nebulizer solution 3 mL, 3 mL, Nebulization, Q4H, VachVaughan Basta, 3 mL at 12/01/18 0824 .  isosorbide-hydrALAZINE (BIDIL) 20-37.5 MG per tablet 1 tablet, 1 tablet, Oral, TID, VachVaughan Basta .  losartan (  COZAAR) tablet 50 mg, 50 mg, Oral, Daily, Vaughan Basta, MD, 50 mg at 11/29/18 1443 .  MEDLINE mouth rinse, 15 mL, Mouth Rinse, BID, Leahanna Buser, MD, 15 mL at 11/30/18 2310 .  methylPREDNISolone sodium succinate (SOLU-MEDROL) 125 mg/2 mL injection 60 mg, 60 mg, Intravenous, Q6H, Vaughan Basta, MD, 60 mg at 12/01/18  1202 .  midazolam (VERSED) injection 2-4 mg, 2-4 mg, Intravenous, Q15 min PRN, Tukov-Yual, Magdalene S, NP, 2 mg at 11/29/18 2310 .  midazolam (VERSED) injection 2-4 mg, 2-4 mg, Intravenous, Q2H PRN, Tukov-Yual, Magdalene S, NP .  nitroGLYCERIN (NITROSTAT) SL tablet 0.4 mg, 0.4 mg, Sublingual, Q5 min PRN, Vaughan Basta, MD .  norepinephrine (LEVOPHED) 16 mg in 242m premix infusion, 0-65 mcg/min, Intravenous, Titrated, Tukov-Yual, Magdalene S, NP, Stopped at 11/30/18 0445 .  oseltamivir (TAMIFLU) capsule 75 mg, 75 mg, Oral, BID, VVaughan Basta MD, 75 mg at 12/01/18 0911 .  pantoprazole (PROTONIX) injection 40 mg, 40 mg, Intravenous, Daily, Tukov-Yual, Magdalene S, NP, 40 mg at 12/01/18 0910 .  piperacillin-tazobactam (ZOSYN) IVPB 3.375 g, 3.375 g, Intravenous, Q8H, Tukov-Yual, Magdalene S, NP, Last Rate: 12.5 mL/hr at 12/01/18 0902, 3.375 g at 12/01/18 0902 .  propofol (DIPRIVAN) 1000 MG/100ML infusion, 0-50 mcg/kg/min, Intravenous, Continuous, Tukov-Yual, Magdalene S, NP, Stopped at 11/29/18 2000 .  rosuvastatin (CRESTOR) tablet 40 mg, 40 mg, Oral, Daily, VVaughan Basta MD, 40 mg at 12/01/18 0911 .  sennosides (SENOKOT) 8.8 MG/5ML syrup 5 mL, 5 mL, Per Tube, BID PRN, Tukov-Yual, Magdalene S, NP .  sodium chloride flush (NS) 0.9 % injection 10-40 mL, 10-40 mL, Intracatheter, Q12H, Tukov-Yual, Magdalene S, NP, 10 mL at 12/01/18 0910 .  sodium chloride flush (NS) 0.9 % injection 10-40 mL, 10-40 mL, Intracatheter, PRN, Tukov-Yual, Magdalene S, NP .  tiotropium (SPIRIVA) inhalation capsule ([facility] use ONLY) 18 mcg, 18 mcg, Inhalation, Daily, VVaughan Basta MD, 18 mcg at 12/01/18 0913 .  vancomycin (VANCOCIN) 1,250 mg in sodium chloride 0.9 % 250 mL IVPB, 1,250 mg, Intravenous, Q12H, Tukov-Yual, Magdalene S, NP, Last Rate: 166.7 mL/hr at 12/01/18 1000    ALLERGIES   Asa [aspirin]    REVIEW OF SYSTEMS    Vitals:   12/01/18 1100 12/01/18 1200  BP: 96/73 (!)  107/94  Pulse: (!) 58 71  Resp: 12 19  Temp:  97.7 F (36.5 C)  SpO2: 94% 97%    10 system ROS is conducted and is negative except as per subjective findings.   PHYSICAL EXAMINATION   GENERAL: Mild distress due to discomfort in bed and underlying schizophrenia HEAD: Normocephalic, atraumatic.  EYES: Pupils equal, round, reactive to light.  No scleral icterus.  MOUTH: Moist mucosal membrane. NECK: Supple. No thyromegaly. No nodules. No JVD.  PULMONARY: +rhonchi at bases CARDIOVASCULAR: S1 and S2. Regular rate and rhythm. No murmurs, rubs, or gallops.  GASTROINTESTINAL: Soft, nontender, non-distended. No masses. Positive bowel sounds. No hepatosplenomegaly.  MUSCULOSKELETAL: No swelling, clubbing, or edema.  NEUROLOGIC: Mild distress due to acute illness SKIN:intact,warm,dry   LABS AND IMAGING     -I personally reviewed most recent blood work, imaging and microbiology - significant findings today are acute kidney injury, hyperglycemia, leukocytosis  LAB RESULTS: Recent Labs  Lab 11/30/18 0514 11/30/18 2155 12/01/18 0443  NA 143 142 140  K 3.9 4.2 4.3  CL 107 106 105  CO2 '29 31 30  ' BUN 38* 43* 48*  CREATININE 1.59* 1.56* 1.71*  GLUCOSE 175* 185* 191*   Recent Labs  Lab 11/29/18 2051 11/30/18 0514 12/01/18 0443  HGB 13.3 13.1 13.6  HCT 43.8 42.0 43.7  WBC 6.0 13.9* 18.9*  PLT 269 256 266     IMAGING RESULTS: No results found.    ASSESSMENT AND PLAN     Severe Hypoxic and Hypercapnic Respiratory Failure -Secondary to influenza A infection -Patient successfully liberated from mechanical ventilation. -continue Bronchodilator Therapy -Wean Fio2 and PEEP as tolerated -will perform SAT/SBT when respiratory parameters are met -Continue Tamiflu  CARDIAC FAILURE- -appreciate cardiology recommendations Dr.Fath -plan for repeat x-ray and diuresis -Echo completed,BNP over 3000 -oxygen as needed -follow up cardiac enzymes as indicated ICU  monitoring  Renal Failure-most likely due to ATN -follow chem 7 -follow UO -continue Foley Catheter-assess need daily   NEUROLOGY -Schizophrenia at baseline -psychiatry evaluation pending discussed case with psychiatrist today - minimal sedation to achieve a RASS goal: -1 Wake up assessment pending   GI/Nutrition GI PROPHYLAXIS as indicated DIET-->TF's as tolerated Constipation protocol as indicated  ENDO - ICU hypoglycemic\Hyperglycemia protocol -check FSBS per protocol   ELECTROLYTES -follow labs as needed -replace as needed -pharmacy consultation   DVT/GI PRX ordered -SCDs  TRANSFUSIONS AS NEEDED MONITOR FSBS ASSESS the need for LABS as needed    Critical care provider statement:    Critical care time (minutes):  35   Critical care time was exclusive of:  Separately billable procedures and treating other patients   Critical care was necessary to treat or prevent imminent or life-threatening deterioration of the following conditions:   Acute hypoxemic respiratory failure, influenza A infection, history of systolic CHF, schizophrenia   Critical care was time spent personally by me on the following activities:  Development of treatment plan with patient or surrogate, discussions with consultants, evaluation of patient's response to treatment, examination of patient, obtaining history from patient or surrogate, ordering and performing treatments and interventions, ordering and review of laboratory studies and re-evaluation of patient's condition.  I assumed direction of critical care for this patient from another provider in my specialty: no    This document was prepared using Dragon voice recognition software and may include unintentional dictation errors.    Ottie Glazier, M.D.  Division of [facility]

## 2018-12-01 NOTE — Progress Notes (Signed)
ANTICOAGULATION CONSULT NOTE   Pharmacy Consult for Heparin Dosing Indication: chest pain/ACS  Allergies  Allergen Reactions  . Asa [Aspirin]     Patient Measurements: Height: 6' (182.9 cm) Weight: 220 lb (99.8 kg) IBW/kg (Calculated) : 77.6 Heparin Dosing Weight: 97.8  Vital Signs: Temp: 97.7 F (36.5 C) (02/09 1200) Temp Source: Oral (02/09 1200) BP: 107/94 (02/09 1200) Pulse Rate: 71 (02/09 1200)  Labs: Recent Labs    11/29/18 0739 11/29/18 1019 11/29/18 1741 11/29/18 2051 11/30/18 0514  11/30/18 1428 11/30/18 2155 12/01/18 0443 12/01/18 1211  HGB 14.7  --  13.7 13.3 13.1  --   --   --  13.6  --   HCT 46.5  --  44.2 43.8 42.0  --   --   --  43.7  --   PLT 308  --  246 269 256  --   --   --  266  --   APTT 30  --   --   --   --   --   --   --   --   --   LABPROT 15.6*  --   --  16.1*  --   --   --   --   --   --   INR 1.25  --   --  1.30  --   --   --   --   --   --   HEPARINUNFRC  --   --  0.57  --   --    < > 0.53  --  0.74* 0.73*  CREATININE 1.44*  --  1.59* 2.11* 1.59*  --   --  1.56* 1.71*  --   TROPONINI 0.60* 0.74* 1.42* 1.62*  --   --   --   --   --   --    < > = values in this interval not displayed.    Estimated Creatinine Clearance: 62.5 mL/min (A) (by C-G formula based on SCr of 1.71 mg/dL (H)).   Medical History: Past Medical History:  Diagnosis Date  . CHF (congestive heart failure) (HCC)   . COPD (chronic obstructive pulmonary disease) (HCC)   . Diabetes mellitus without complication (HCC)   . Hypertension   . Mental health disorder     Goal of Therapy:  Heparin level 0.3-0.7 units/ml Monitor platelets by anticoagulation protocol: Yes   Plan:  2/9  12:11  HL = 0.73, Decrease to 1000 units/hr and recheck in 6 hours at 19:00.     Alcario Tinkey K, RPH  12/01/2018,1:10 PM

## 2018-12-02 LAB — GLUCOSE, CAPILLARY
GLUCOSE-CAPILLARY: 261 mg/dL — AB (ref 70–99)
Glucose-Capillary: 228 mg/dL — ABNORMAL HIGH (ref 70–99)
Glucose-Capillary: 309 mg/dL — ABNORMAL HIGH (ref 70–99)
Glucose-Capillary: 281 mg/dL — ABNORMAL HIGH (ref 70–99)

## 2018-12-02 LAB — BASIC METABOLIC PANEL
Anion gap: 6 (ref 5–15)
BUN: 61 mg/dL — ABNORMAL HIGH (ref 6–20)
CALCIUM: 7.5 mg/dL — AB (ref 8.9–10.3)
CO2: 29 mmol/L (ref 22–32)
Chloride: 101 mmol/L (ref 98–111)
Creatinine, Ser: 1.73 mg/dL — ABNORMAL HIGH (ref 0.61–1.24)
GFR calc Af Amer: 52 mL/min — ABNORMAL LOW (ref >60)
GFR calc non Af Amer: 45 mL/min — ABNORMAL LOW (ref >60)
Glucose, Bld: 231 mg/dL — ABNORMAL HIGH (ref 70–99)
POTASSIUM: 4.5 mmol/L (ref 3.5–5.1)
Sodium: 136 mmol/L (ref 135–145)

## 2018-12-02 LAB — HEPARIN LEVEL (UNFRACTIONATED): Heparin Unfractionated: 0.57 IU/mL (ref 0.30–0.70)

## 2018-12-02 LAB — MAGNESIUM: Magnesium: 2.6 mg/dL — ABNORMAL HIGH (ref 1.7–2.4)

## 2018-12-02 MED ORDER — SODIUM CHLORIDE 0.9 % IV BOLUS
1000.0000 mL | Freq: Once | INTRAVENOUS | Status: AC
  Administered 2018-12-02: 1000 mL via INTRAVENOUS

## 2018-12-02 MED ORDER — HALOPERIDOL LACTATE 5 MG/ML IJ SOLN
5.0000 mg | Freq: Two times a day (BID) | INTRAMUSCULAR | Status: DC | PRN
  Administered 2018-12-02: 5 mg via INTRAVENOUS
  Filled 2018-12-02: qty 1

## 2018-12-02 MED ORDER — ENOXAPARIN SODIUM 40 MG/0.4ML ~~LOC~~ SOLN
40.0000 mg | SUBCUTANEOUS | Status: DC
  Administered 2018-12-02 – 2018-12-03 (×2): 40 mg via SUBCUTANEOUS
  Filled 2018-12-02 (×2): qty 0.4

## 2018-12-02 MED ORDER — METHYLPREDNISOLONE SODIUM SUCC 40 MG IJ SOLR
40.0000 mg | Freq: Two times a day (BID) | INTRAMUSCULAR | Status: DC
  Administered 2018-12-02 (×2): 40 mg via INTRAVENOUS
  Filled 2018-12-02 (×2): qty 1

## 2018-12-02 NOTE — Progress Notes (Signed)
[facility] [place] [facility]                                                                                                                                                                                  Patient Demographics   Durward ParcelDennis Schinke, is a 53 y.o. male, DOB - Apr 18, 1966, ZOX:[id number]  Admit date - 11/29/2018   Admitting Physician Altamese DillingVaibhavkumar Vachhani, MD  Outpatient Primary MD for the patient is [name], [name], MD   LOS - 3  Subjective:  Doing better however has confusion at baseline   Review of Systems:   CONSTITUTIONAL: Extubated not able to provide any review of systems  Vitals:   Vitals:   12/02/18 0700 12/02/18 0800 12/02/18 0900 12/02/18 1233  BP:      Pulse:    86  Resp: 17 (!) 22 15 20   Temp:      TempSrc:      SpO2:      Weight:      Height:        Wt Readings from Last 3 Encounters:  11/29/18 99.8 kg  10/08/18 106.6 kg  11/29/17 101.6 kg     Intake/Output Summary (Last 24 hours) at 12/02/2018 1304 Last data filed at 12/02/2018 0914 Gross per 24 hour  Intake 332.86 ml  Output 365 ml  Net -32.14 ml    Physical Exam:   GENERAL: Chronically ill-appearing HEAD, EYES, EARS, NOSE AND THROAT: Atraumatic, normocephalic. Extraocular muscles are intact. Pupils equal and reactive to light. Sclerae anicteric. No conjunctival injection. No oro-pharyngeal erythema.  NECK: Supple. There is no jugular venous distention. No bruits, no lymphadenopathy, no thyromegaly.  HEART: Regular rate and rhythm,. No murmurs, no rubs, no clicks.  LUNGS: Clear to auscultation bilaterally. No rales or rhonchi. No wheezes.  ABDOMEN: Soft, flat, nontender, nondistended. Has good bowel sounds. No hepatosplenomegaly appreciated.  EXTREMITIES: No evidence of any cyanosis, clubbing, or peripheral edema.  +2 pedal and radial pulses bilaterally.  NEUROLOGIC: Patient awake and alert but not oriented sKIN: Moist and warm with no rashes appreciated.  Psych:  Not anxious, depressed LN: No inguinal LN enlargement    Antibiotics   Anti-infectives (From admission, onward)   Start     Dose/Rate Route Frequency Ordered Stop   11/30/18 2030  vancomycin (VANCOCIN) 1,250 mg in sodium chloride 0.9 % 250 mL IVPB  Status:  Discontinued     1,250 mg 166.7 mL/hr over 90 Minutes Intravenous Every 12 hours 11/30/18 1005 12/02/18 1040   11/30/18 0800  piperacillin-tazobactam (ZOSYN) IVPB 3.375 g  Status:  Discontinued     3.375 g 12.5 mL/hr over 240 Minutes Intravenous Every 8 hours 11/30/18  8527 12/02/18 1040   11/30/18 0800  vancomycin (VANCOCIN) 2,000 mg in sodium chloride 0.9 % 500 mL IVPB     2,000 mg 250 mL/hr over 120 Minutes Intravenous  Once 11/30/18 0744 11/30/18 1045   11/29/18 1130  oseltamivir (TAMIFLU) capsule 75 mg     75 mg Oral 2 times daily 11/29/18 1100 12/04/18 0959   11/29/18 0845  oseltamivir (TAMIFLU) capsule 75 mg     75 mg Oral  Once 11/29/18 0835 11/29/18 0843      Medications   Scheduled Meds: . aspirin EC  325 mg Oral Daily  . benztropine  2 mg Oral QHS  . budesonide (PULMICORT) nebulizer solution  0.5 mg Nebulization BID  . carvedilol  25 mg Oral BID WC  . diphenhydrAMINE  25 mg Oral QHS  . enoxaparin (LOVENOX) injection  40 mg Subcutaneous Q24H  . ezetimibe  10 mg Oral Daily  . fluticasone  1 spray Each Nare Daily  . haloperidol  5 mg Oral BID  . insulin aspart  0-5 Units Subcutaneous QHS  . insulin aspart  0-9 Units Subcutaneous TID WC  . ipratropium-albuterol  3 mL Nebulization Q4H  . isosorbide-hydrALAZINE  1 tablet Oral TID  . losartan  50 mg Oral Daily  . mouth rinse  15 mL Mouth Rinse BID  . methylPREDNISolone (SOLU-MEDROL) injection  40 mg Intravenous Q12H  . oseltamivir  75 mg Oral BID  . pantoprazole (PROTONIX) IV  40 mg Intravenous Daily  . sodium chloride flush  10-40 mL Intracatheter Q12H  . tiotropium  18 mcg Inhalation Daily   Continuous Infusions: . sodium chloride Stopped (11/30/18 0045)  .  dexmedetomidine (PRECEDEX) IV infusion Stopped (12/01/18 1040)  . DOPamine Stopped (11/30/18 1337)  . fentaNYL infusion INTRAVENOUS Stopped (11/30/18 1330)  . norepinephrine (LEVOPHED) Adult infusion Stopped (11/30/18 0445)  . propofol (DIPRIVAN) infusion Stopped (11/29/18 2000)   PRN Meds:.sodium chloride, bisacodyl, docusate sodium, midazolam, midazolam, nitroGLYCERIN, sennosides, sodium chloride flush   Data Review:   Micro Results Recent Results (from the past 240 hour(s))  MRSA PCR Screening     Status: None   Collection Time: 11/29/18  5:33 PM  Result Value Ref Range Status   MRSA by PCR NEGATIVE NEGATIVE Final    Comment:        The GeneXpert MRSA Assay (FDA approved for NASAL specimens only), is one component of a comprehensive MRSA colonization surveillance program. It is not intended to diagnose MRSA infection nor to guide or monitor treatment for MRSA infections. Performed at [facility], [address], [place], [place] [address]     Radiology Reports Dg Abd 1 View  Result Date: 11/29/2018 CLINICAL DATA:  Orogastric tube placement. EXAM: ABDOMEN - 1 VIEW COMPARISON:  Abdomen and pelvis CT dated 04/12/2011. FINDINGS: Orogastric tube extending into the stomach with its tip and side hole in the proximal stomach. Gas and stool in normal caliber colon. Clear lung bases. Enlarged heart. Unremarkable bones. IMPRESSION: 1. Orogastric tube tip and side hole in the proximal stomach. 2. Cardiomegaly. Electronically Signed   By: Beckie Salts M.D.   On: 11/29/2018 21:46   Ct Head Wo Contrast  Result Date: 11/30/2018 CLINICAL DATA:  Encephalopathy.  Altered level of consciousness. EXAM: CT HEAD WITHOUT CONTRAST TECHNIQUE: Contiguous axial images were obtained from the base of the skull through the vertex without intravenous contrast. COMPARISON:  MRI 11/16/2017 FINDINGS: Brain: Mild cerebral atrophy. No acute intracranial abnormality. Specifically, no hemorrhage,  hydrocephalus, mass lesion,  acute infarction, or significant intracranial injury. Vascular: No hyperdense vessel or unexpected calcification. Skull: No acute calvarial abnormality. Sinuses/Orbits: Visualized paranasal sinuses and mastoids clear. Orbital soft tissues unremarkable. Other: None IMPRESSION: Mild cerebral atrophy.  No acute intracranial abnormality. Electronically Signed   By: Charlett Nose M.D.   On: 11/30/2018 01:41   Dg Chest Port 1 View  Result Date: 12/01/2018 CLINICAL DATA:  53 year old male recently admitted with acute respiratory failure. EXAM: PORTABLE CHEST 1 VIEW COMPARISON:  To a 20 and earlier. FINDINGS: Portable AP upright view at 1016 hours. Extubated and enteric tube removed. Stable right IJ central line. Stable lung volumes and mediastinal contours. No pneumothorax, pulmonary edema, pleural effusion or consolidation. Minimal bibasilar increased opacity appears stable and probably reflects crowding. Visualized tracheal air column is within normal limits. IMPRESSION: 1. Extubated and enteric tube removed. 2. Stable lung volumes.  No acute cardiopulmonary abnormality. Electronically Signed   By: Odessa Fleming M.D.   On: 12/01/2018 13:24   Portable Chest Xray  Result Date: 11/30/2018 CLINICAL DATA:  Respiratory failure EXAM: PORTABLE CHEST 1 VIEW COMPARISON:  11/29/2018 FINDINGS: Endotracheal tube is in place, tip 5.6 centimeters above the carina. A RIGHT IJ central line tip overlies the superior vena cava. A nasogastric tube is in place, tip beyond the gastroesophageal junction and beyond the edge of the image. Heart size is accentuated by technique and probably mildly enlarged. There is subsegmental atelectasis or early infiltrate at the RIGHT lung base. IMPRESSION: 1. Lines and tubes as described. 2. Minimal new RIGHT lower lobe atelectasis or infiltrate. Electronically Signed   By: Norva Pavlov M.D.   On: 11/30/2018 08:14   Dg Chest Port 1 View  Result Date: 11/29/2018 CLINICAL  DATA:  Intubated. Central line placement. EXAM: PORTABLE CHEST 1 VIEW COMPARISON:  Earlier today. FINDINGS: The endotracheal tube remains in satisfactory position. Interval right jugular catheter with its tip in the superior vena cava. No pneumothorax. Stable enlarged cardiac silhouette. Clear lungs with normal vascularity. Unremarkable bones. IMPRESSION: 1. Right jugular catheter tip in the superior vena cava without pneumothorax. 2. Stable cardiomegaly. Electronically Signed   By: Beckie Salts M.D.   On: 11/29/2018 21:47   Portable Chest X-ray  Result Date: 11/29/2018 CLINICAL DATA:  Status post intubation. EXAM: PORTABLE CHEST 1 VIEW COMPARISON:  11/29/2018. FINDINGS: Interval endotracheal tube in satisfactory position. Stable mildly enlarged cardiac silhouette. Clear lungs with normal vascularity. Mild peribronchial thickening. Unremarkable bones. IMPRESSION: 1. Endotracheal tube in satisfactory position. 2. Stable mild cardiomegaly with interval mild bronchitic changes. Electronically Signed   By: Beckie Salts M.D.   On: 11/29/2018 20:17   Dg Chest Port 1 View  Result Date: 11/29/2018 CLINICAL DATA:  Shortness of breath EXAM: PORTABLE CHEST 1 VIEW COMPARISON:  October 25, 2017 FINDINGS: No edema or consolidation. Heart is mildly enlarged with pulmonary vascularity normal. No adenopathy. No bone lesions. IMPRESSION: No edema or consolidation.  Mild cardiac enlargement. Electronically Signed   By: Bretta Bang III M.D.   On: 11/29/2018 07:51     CBC Recent Labs  Lab 11/29/18 0739 11/29/18 1741 11/29/18 2051 11/30/18 0514 12/01/18 0443  WBC 9.6 7.6 6.0 13.9* 18.9*  HGB 14.7 13.7 13.3 13.1 13.6  HCT 46.5 44.2 43.8 42.0 43.7  PLT 308 246 269 256 266  MCV 91.7 94.6 96.7 92.7 92.8  MCH 29.0 29.3 29.4 28.9 28.9  MCHC 31.6 31.0 30.4 31.2 31.1  RDW 13.9 14.0 14.2 14.2 14.6  LYMPHSABS 0.2*  --   --   --   --  MONOABS 0.5  --   --   --   --   EOSABS 0.0  --   --   --   --   BASOSABS 0.0  --    --   --   --     Chemistries  Recent Labs  Lab 11/29/18 2051 11/30/18 0514 11/30/18 0912 11/30/18 2155 12/01/18 0443 12/02/18 0609  NA 142 143  --  142 140 136  K 5.9* 3.9  --  4.2 4.3 4.5  CL 104 107  --  106 105 101  CO2 32 29  --  31 30 29   GLUCOSE 105* 175*  --  185* 191* 231*  BUN 37* 38*  --  43* 48* 61*  CREATININE 2.11* 1.59*  --  1.56* 1.71* 1.73*  CALCIUM 7.7* 7.4*  --  7.2* 7.4* 7.5*  MG 2.5*  --   --  2.3 2.4 2.6*  AST 222*  --  446*  --   --   --   ALT 154*  --  301*  --   --   --   ALKPHOS 49  --  47  --   --   --   BILITOT 0.9  --  0.9  --   --   --    ------------------------------------------------------------------------------------------------------------------ estimated creatinine clearance is 61.8 mL/min (A) (by C-G formula based on SCr of 1.73 mg/dL (H)). ------------------------------------------------------------------------------------------------------------------ No results for input(s): HGBA1C in the last 72 hours. ------------------------------------------------------------------------------------------------------------------ Recent Labs    11/29/18 2051  TRIG 37   ------------------------------------------------------------------------------------------------------------------ No results for input(s): TSH, T4TOTAL, T3FREE, THYROIDAB in the last 72 hours.  Invalid input(s): FREET3 ------------------------------------------------------------------------------------------------------------------ No results for input(s): VITAMINB12, FOLATE, FERRITIN, TIBC, IRON, RETICCTPCT in the last 72 hours.  Coagulation profile Recent Labs  Lab 11/29/18 0739 11/29/18 2051  INR 1.25 1.30    No results for input(s): DDIMER in the last 72 hours.  Cardiac Enzymes Recent Labs  Lab 11/29/18 1019 11/29/18 1741 11/29/18 2051  TROPONINI 0.74* 1.42* 1.62*    ------------------------------------------------------------------------------------------------------------------ Invalid input(s): POCBNP    Assessment & Plan   *Acute respiratory failure with hypoxia COPD exacerbation Secondary to influenza A Continue Tamiflu Continue nebulizer therapy  *Elevated troponin-patient denies any chest pain.  He has some sinus tachycardia on EKG due to respiratory failure but no other changes. Seen by cardiology no ACS Heparin has been discontinued I will call cardiology consult for help.  Follow serial troponins. Continue aspirin and carvedilol.  *Hypotension continue pressors  *Acute renal insufficiency Renal function slightly altered compared to previous, continue to monitor. Renal function stable  *Chronic systolic congestive heart failure Chest x-ray is clear and does not have symptoms of CHF exacerbation currently. Ejection fraction 30% as well past records, continue to monitor.  *Diabetes mellitus Hold metformin due to worsening renal function Keep on sliding scale coverage.  *Active smoking Counseled to quit smoking      Code Status Orders  (From admission, onward)         Start     Ordered   11/29/18 1256  Full code  Continuous     11/29/18 1255        Code Status History    Date Active Date Inactive Code Status Order ID Comments User Context   11/15/2017 2242 11/16/2017 2016 Full Code [id number]  Oralia Manis, MD Inpatient   09/16/2017 1534 09/18/2017 1440 Full Code [id number]  Alford Highland, MD ED           Consults  none  DVT Prophylaxis Heparin Lab Results  Component Value Date   PLT 266 12/01/2018     Time Spent in minutes   Greater than 50% of time spent in care coordination and counseling patient regarding the condition and plan of care.   Auburn Bilberry M.D on 12/02/2018 at 1:04 PM  Between 7am to 6pm - Pager - [phone]  After 6pm go to [email or web address] - Air traffic controller  [facility]   Office  [phone]

## 2018-12-02 NOTE — Progress Notes (Signed)
Dr. Lady Gary notified of patient refusing medications and having a 5 beat and 18 beat run of v-tach.

## 2018-12-02 NOTE — Progress Notes (Signed)
   12/02/18 1100  Clinical Encounter Type  Visited With Patient  Visit Type Follow-up;Spiritual support  Referral From Chaplain  Consult/Referral To Chaplain  Spiritual Encounters  Spiritual Needs Emotional  Chaplain was rounding and stop to visit patient. Patient thought Chaplain was someone he knew. Chaplain introduced herself to patient. Patient was sitting up and chair by bed. Chaplain ask how patient was doing and patient said he was doing better.Patient was glad that Chaplain stop by. Patients friend Luster Landsberg came by that is who patient thought the Chaplain was. Chaplain left so patient can visit with friend.

## 2018-12-02 NOTE — Progress Notes (Signed)
Inpatient Diabetes Program Recommendations  AACE/ADA: New Consensus Statement on Inpatient Glycemic Control (2015)  Target Ranges:  Prepandial:   less than 140 mg/dL      Peak postprandial:   less than 180 mg/dL (1-2 hours)      Critically ill patients:  140 - 180 mg/dL   Results for Paul Hernandez, Paul Hernandez (MRN [id number]) as of 12/02/2018 11:56  Ref. Range 12/01/2018 08:45 12/01/2018 11:57 12/01/2018 15:56 12/01/2018 19:28 12/01/2018 21:38  Glucose-Capillary Latest Ref Range: 70 - 99 mg/dL 151 (H) 761 (H) 607 (H) 279 (H) 236 (H)   Results for Paul Hernandez, Paul Hernandez (MRN [id number]) as of 12/02/2018 11:56  Ref. Range 12/02/2018 07:22 12/02/2018 11:46  Glucose-Capillary Latest Ref Range: 70 - 99 mg/dL 854 (H) 627 (H)    Home DM Meds: Farxiga 5 mg daily       Metformin 1500 mg AM/ 1000 mg PM   Current Orders: Novolog Sensitive Correction Scale/ SSI (0-9 units) TID AC + HS      Solumedrol reduced to 40 mg BID today (was 60 mg Q6H).  CBGs still running >200 mg/dl today.    MD- Please consider the following in-hospital insulin adjustments while patient remains on IV steroids:  1. Start Lantus 10 units QHS (0.1 units/kg dosing based on weight of 99 kg)  2. Start Novolog Meal Coverage: Novolog 3 units TID with meals  (Please add the following Hold Parameters: Hold if pt eats <50% of meal, Hold if pt NPO)      --Will follow patient during hospitalization--  Ambrose Finland RN, MSN, CDE Diabetes Coordinator Inpatient Glycemic Control Team Team Pager: [phone] (8a-5p)

## 2018-12-02 NOTE — Progress Notes (Signed)
   12/02/18 0300  Clinical Encounter Type  Visited With Patient  Visit Type Initial;Spiritual support  Referral From Nurse  Spiritual Encounters  Spiritual Needs Emotional;Grief support  Stress Factors  Patient Stress Factors Exhausted;Other (Comment)

## 2018-12-02 NOTE — Plan of Care (Signed)
Patient is currently MedSurg status house in ICU due to no beds in the regular ward.  He has been somewhat impulsive due to underlying schizophrenia.  Was refusing his medications earlier but now more cooperative after getting Haldol IV.  Continue supportive care.  Patient was discussed during multidisciplinary rounds.

## 2018-12-02 NOTE — Progress Notes (Signed)
Dr. Cassie Freer notified of 5 beat and 18 beat runs of V-tach.

## 2018-12-02 NOTE — Progress Notes (Signed)
ANTICOAGULATION CONSULT NOTE   Pharmacy Consult for Heparin Dosing Indication: chest pain/ACS  Allergies  Allergen Reactions  . Asa [Aspirin]     Patient Measurements: Height: 6' (182.9 cm) Weight: 220 lb (99.8 kg) IBW/kg (Calculated) : 77.6 Heparin Dosing Weight: 97.8  Vital Signs: Temp: 97.7 F (36.5 C) (02/09 2100) Temp Source: Oral (02/09 2100) BP: 82/45 (02/09 2200) Pulse Rate: 69 (02/09 2200)  Labs: Recent Labs    11/29/18 0739 11/29/18 1019 11/29/18 1741 11/29/18 2051 11/30/18 0514  11/30/18 2155 12/01/18 0443 12/01/18 1211 12/01/18 1830 12/02/18 0048  HGB 14.7  --  13.7 13.3 13.1  --   --  13.6  --   --   --   HCT 46.5  --  44.2 43.8 42.0  --   --  43.7  --   --   --   PLT 308  --  246 269 256  --   --  266  --   --   --   APTT 30  --   --   --   --   --   --   --   --   --   --   LABPROT 15.6*  --   --  16.1*  --   --   --   --   --   --   --   INR 1.25  --   --  1.30  --   --   --   --   --   --   --   HEPARINUNFRC  --   --  0.57  --   --    < >  --  0.74* 0.73* 0.54 0.57  CREATININE 1.44*  --  1.59* 2.11* 1.59*  --  1.56* 1.71*  --   --   --   TROPONINI 0.60* 0.74* 1.42* 1.62*  --   --   --   --   --   --   --    < > = values in this interval not displayed.    Estimated Creatinine Clearance: 62.5 mL/min (A) (by C-G formula based on SCr of 1.71 mg/dL (H)).   Medical History: Past Medical History:  Diagnosis Date  . CHF (congestive heart failure) (HCC)   . COPD (chronic obstructive pulmonary disease) (HCC)   . Diabetes mellitus without complication (HCC)   . Hypertension   . Mental health disorder    Assessment:  2/9 1830 Heparin level 0.54 - within goal.   Goal of Therapy:  Heparin level 0.3-0.7 units/ml Monitor platelets by anticoagulation protocol: Yes   Plan:  Will continue with current rate. Will order a heparin level in 6 hours and switch to daily levels if next level is therapeutic.   2/10 0100 heparin level 0.57. Continue current  regimen. Recheck heparin level and CBC with tomorrow AM labs.    Laurine Kuyper S, PharmD, BCPS  12/02/2018,1:26 AM

## 2018-12-03 LAB — GLUCOSE, CAPILLARY
Glucose-Capillary: 203 mg/dL — ABNORMAL HIGH (ref 70–99)
Glucose-Capillary: 229 mg/dL — ABNORMAL HIGH (ref 70–99)
Glucose-Capillary: 194 mg/dL — ABNORMAL HIGH (ref 70–99)
Glucose-Capillary: 201 mg/dL — ABNORMAL HIGH (ref 70–99)

## 2018-12-03 LAB — HEPATIC FUNCTION PANEL
ALT: 203 U/L — ABNORMAL HIGH (ref 0–44)
AST: 49 U/L — AB (ref 15–41)
Albumin: 3 g/dL — ABNORMAL LOW (ref 3.5–5.0)
Alkaline Phosphatase: 46 U/L (ref 38–126)
Bilirubin, Direct: 0.2 mg/dL (ref 0.0–0.2)
Indirect Bilirubin: 0.4 mg/dL (ref 0.3–0.9)
Total Bilirubin: 0.6 mg/dL (ref 0.3–1.2)
Total Protein: 5.4 g/dL — ABNORMAL LOW (ref 6.5–8.1)

## 2018-12-03 LAB — CBC
HCT: 39.5 % (ref 39.0–52.0)
Hemoglobin: 12.6 g/dL — ABNORMAL LOW (ref 13.0–17.0)
MCH: 28.9 pg (ref 26.0–34.0)
MCHC: 31.9 g/dL (ref 30.0–36.0)
MCV: 90.6 fL (ref 80.0–100.0)
NRBC: 0 % (ref 0.0–0.2)
Platelets: 217 10*3/uL (ref 150–400)
RBC: 4.36 MIL/uL (ref 4.22–5.81)
RDW: 13.8 % (ref 11.5–15.5)
WBC: 13.9 10*3/uL — ABNORMAL HIGH (ref 4.0–10.5)

## 2018-12-03 LAB — BASIC METABOLIC PANEL
Anion gap: 5 (ref 5–15)
BUN: 45 mg/dL — AB (ref 6–20)
CO2: 30 mmol/L (ref 22–32)
Calcium: 7.8 mg/dL — ABNORMAL LOW (ref 8.9–10.3)
Chloride: 102 mmol/L (ref 98–111)
Creatinine, Ser: 1.33 mg/dL — ABNORMAL HIGH (ref 0.61–1.24)
GFR calc Af Amer: >60 mL/min (ref >60)
GFR calc non Af Amer: >60 mL/min (ref >60)
Glucose, Bld: 265 mg/dL — ABNORMAL HIGH (ref 70–99)
POTASSIUM: 4.3 mmol/L (ref 3.5–5.1)
Sodium: 137 mmol/L (ref 135–145)

## 2018-12-03 LAB — PHOSPHORUS: Phosphorus: 2.4 mg/dL — ABNORMAL LOW (ref 2.5–4.6)

## 2018-12-03 LAB — MAGNESIUM: MAGNESIUM: 2.8 mg/dL — AB (ref 1.7–2.4)

## 2018-12-03 MED ORDER — IPRATROPIUM-ALBUTEROL 0.5-2.5 (3) MG/3ML IN SOLN
3.0000 mL | Freq: Three times a day (TID) | RESPIRATORY_TRACT | Status: DC
  Administered 2018-12-03 – 2018-12-04 (×4): 3 mL via RESPIRATORY_TRACT
  Filled 2018-12-03 (×4): qty 3

## 2018-12-03 MED ORDER — PANTOPRAZOLE SODIUM 40 MG PO TBEC
40.0000 mg | DELAYED_RELEASE_TABLET | Freq: Every day | ORAL | Status: DC
  Administered 2018-12-03: 40 mg via ORAL
  Filled 2018-12-03: qty 1

## 2018-12-03 MED ORDER — SENNOSIDES-DOCUSATE SODIUM 8.6-50 MG PO TABS: 1.0000 | ORAL_TABLET | Freq: Every day | ORAL | Status: DC

## 2018-12-03 MED ORDER — SENNA 8.6 MG PO TABS: 1.0000 | ORAL_TABLET | Freq: Two times a day (BID) | ORAL | Status: DC | PRN

## 2018-12-03 NOTE — Progress Notes (Signed)
[employer] - [place] at [facility]                                                                                                                                                                                  Patient Demographics   Paul Hernandez, is a 53 y.o. male, DOB - [birth year], ZOX:[id number]  Admit date - 11/29/2018   Admitting Physician Altamese Dilling, MD  Outpatient Primary MD for the patient is Rexene Agent, MD   LOS - 4  Subjective:  Patient confused, according to nursing notes he had 28 runs of V. tach this morning He is off all the pressors  Review of Systems:   CONSTITUTIONAL: Patient confused at baseline  Vitals:   Vitals:   12/03/18 0600 12/03/18 0701 12/03/18 0824 12/03/18 1338  BP:      Pulse: 79  84 90  Resp: (!) 25  19 (!) 24  Temp:  98.4 F (36.9 C)    TempSrc:  Oral    SpO2: 95%  94%   Weight:      Height:        Wt Readings from Last 3 Encounters:  11/29/18 99.8 kg  10/08/18 106.6 kg  11/29/17 101.6 kg     Intake/Output Summary (Last 24 hours) at 12/03/2018 1441 Last data filed at 12/03/2018 1425 Gross per 24 hour  Intake -  Output 1600 ml  Net -1600 ml    Physical Exam:   GENERAL: Chronically ill-appearing HEAD, EYES, EARS, NOSE AND THROAT: Atraumatic, normocephalic. Extraocular muscles are intact. Pupils equal and reactive to light. Sclerae anicteric. No conjunctival injection. No oro-pharyngeal erythema.  NECK: Supple. There is no jugular venous distention. No bruits, no lymphadenopathy, no thyromegaly.  HEART: Regular rate and rhythm,. No murmurs, no rubs, no clicks.  LUNGS: Clear to auscultation bilaterally. No rales or rhonchi. No wheezes.  ABDOMEN: Soft, flat, nontender, nondistended. Has good bowel sounds. No hepatosplenomegaly appreciated.  EXTREMITIES: No evidence of any cyanosis, clubbing, or peripheral edema.  +2 pedal and radial pulses bilaterally.  NEUROLOGIC: Patient awake and alert but not  oriented sKIN: Moist and warm with no rashes appreciated.  Psych: Not anxious, depressed LN: No inguinal LN enlargement    Antibiotics   Anti-infectives (From admission, onward)   Start     Dose/Rate Route Frequency Ordered Stop   11/30/18 2030  vancomycin (VANCOCIN) 1,250 mg in sodium chloride 0.9 % 250 mL IVPB  Status:  Discontinued     1,250 mg 166.7 mL/hr over 90 Minutes Intravenous Every 12 hours 11/30/18 1005 12/02/18 1040   11/30/18 0800  piperacillin-tazobactam (ZOSYN) IVPB 3.375 g  Status:  Discontinued     3.375  g 12.5 mL/hr over 240 Minutes Intravenous Every 8 hours 11/30/18 0742 12/02/18 1040   11/30/18 0800  vancomycin (VANCOCIN) 2,000 mg in sodium chloride 0.9 % 500 mL IVPB     2,000 mg 250 mL/hr over 120 Minutes Intravenous  Once 11/30/18 0744 11/30/18 1045   11/29/18 1130  oseltamivir (TAMIFLU) capsule 75 mg     75 mg Oral 2 times daily 11/29/18 1100 12/04/18 0959   11/29/18 0845  oseltamivir (TAMIFLU) capsule 75 mg     75 mg Oral  Once 11/29/18 0835 11/29/18 0843      Medications   Scheduled Meds: . aspirin EC  325 mg Oral Daily  . benztropine  2 mg Oral QHS  . budesonide (PULMICORT) nebulizer solution  0.5 mg Nebulization BID  . carvedilol  25 mg Oral BID WC  . diphenhydrAMINE  25 mg Oral QHS  . enoxaparin (LOVENOX) injection  40 mg Subcutaneous Q24H  . ezetimibe  10 mg Oral Daily  . fluticasone  1 spray Each Nare Daily  . haloperidol  5 mg Oral BID  . insulin aspart  0-5 Units Subcutaneous QHS  . insulin aspart  0-9 Units Subcutaneous TID WC  . ipratropium-albuterol  3 mL Nebulization TID  . isosorbide-hydrALAZINE  1 tablet Oral TID  . losartan  50 mg Oral Daily  . mouth rinse  15 mL Mouth Rinse BID  . oseltamivir  75 mg Oral BID  . pantoprazole  40 mg Oral Daily  . senna-docusate  1 tablet Oral QHS  . sodium chloride flush  10-40 mL Intracatheter Q12H  . tiotropium  18 mcg Inhalation Daily   Continuous Infusions: . sodium chloride Stopped  (11/30/18 0045)  . dexmedetomidine (PRECEDEX) IV infusion Stopped (12/01/18 1040)   PRN Meds:.sodium chloride, bisacodyl, docusate sodium, haloperidol lactate, midazolam, midazolam, nitroGLYCERIN, senna, sodium chloride flush   Data Review:   Micro Results Recent Results (from the past 240 hour(s))  MRSA PCR Screening     Status: None   Collection Time: 11/29/18  5:33 PM  Result Value Ref Range Status   MRSA by PCR NEGATIVE NEGATIVE Final    Comment:        The GeneXpert MRSA Assay (FDA approved for NASAL specimens only), is one component of a comprehensive MRSA colonization surveillance program. It is not intended to diagnose MRSA infection nor to guide or monitor treatment for MRSA infections. Performed at [facility], [address] [place], [place] [address]     Radiology Reports Dg Abd 1 View  Result Date: 11/29/2018 CLINICAL DATA:  Orogastric tube placement. EXAM: ABDOMEN - 1 VIEW COMPARISON:  Abdomen and pelvis CT dated 04/12/2011. FINDINGS: Orogastric tube extending into the stomach with its tip and side hole in the proximal stomach. Gas and stool in normal caliber colon. Clear lung bases. Enlarged heart. Unremarkable bones. IMPRESSION: 1. Orogastric tube tip and side hole in the proximal stomach. 2. Cardiomegaly. Electronically Signed   By: Beckie SaltsSteven  Reid M.D.   On: 11/29/2018 21:46   Ct Head Wo Contrast  Result Date: 11/30/2018 CLINICAL DATA:  Encephalopathy.  Altered level of consciousness. EXAM: CT HEAD WITHOUT CONTRAST TECHNIQUE: Contiguous axial images were obtained from the base of the skull through the vertex without intravenous contrast. COMPARISON:  MRI 11/16/2017 FINDINGS: Brain: Mild cerebral atrophy. No acute intracranial abnormality. Specifically, no hemorrhage, hydrocephalus, mass lesion, acute infarction, or significant intracranial injury. Vascular: No hyperdense vessel or unexpected calcification. Skull: No acute calvarial abnormality.  Sinuses/Orbits: Visualized paranasal sinuses  and mastoids clear. Orbital soft tissues unremarkable. Other: None IMPRESSION: Mild cerebral atrophy.  No acute intracranial abnormality. Electronically Signed   By: Charlett NoseKevin  Dover M.D.   On: 11/30/2018 01:41   Dg Chest Port 1 View  Result Date: 12/01/2018 CLINICAL DATA:  53 year old male recently admitted with acute respiratory failure. EXAM: PORTABLE CHEST 1 VIEW COMPARISON:  To a 20 and earlier. FINDINGS: Portable AP upright view at 1016 hours. Extubated and enteric tube removed. Stable right IJ central line. Stable lung volumes and mediastinal contours. No pneumothorax, pulmonary edema, pleural effusion or consolidation. Minimal bibasilar increased opacity appears stable and probably reflects crowding. Visualized tracheal air column is within normal limits. IMPRESSION: 1. Extubated and enteric tube removed. 2. Stable lung volumes.  No acute cardiopulmonary abnormality. Electronically Signed   By: Odessa FlemingH  Hall M.D.   On: 12/01/2018 13:24   Portable Chest Xray  Result Date: 11/30/2018 CLINICAL DATA:  Respiratory failure EXAM: PORTABLE CHEST 1 VIEW COMPARISON:  11/29/2018 FINDINGS: Endotracheal tube is in place, tip 5.6 centimeters above the carina. A RIGHT IJ central line tip overlies the superior vena cava. A nasogastric tube is in place, tip beyond the gastroesophageal junction and beyond the edge of the image. Heart size is accentuated by technique and probably mildly enlarged. There is subsegmental atelectasis or early infiltrate at the RIGHT lung base. IMPRESSION: 1. Lines and tubes as described. 2. Minimal new RIGHT lower lobe atelectasis or infiltrate. Electronically Signed   By: Norva PavlovElizabeth  Brown M.D.   On: 11/30/2018 08:14   Dg Chest Port 1 View  Result Date: 11/29/2018 CLINICAL DATA:  Intubated. Central line placement. EXAM: PORTABLE CHEST 1 VIEW COMPARISON:  Earlier today. FINDINGS: The endotracheal tube remains in satisfactory position. Interval right  jugular catheter with its tip in the superior vena cava. No pneumothorax. Stable enlarged cardiac silhouette. Clear lungs with normal vascularity. Unremarkable bones. IMPRESSION: 1. Right jugular catheter tip in the superior vena cava without pneumothorax. 2. Stable cardiomegaly. Electronically Signed   By: Beckie SaltsSteven  Reid M.D.   On: 11/29/2018 21:47   Portable Chest X-ray  Result Date: 11/29/2018 CLINICAL DATA:  Status post intubation. EXAM: PORTABLE CHEST 1 VIEW COMPARISON:  11/29/2018. FINDINGS: Interval endotracheal tube in satisfactory position. Stable mildly enlarged cardiac silhouette. Clear lungs with normal vascularity. Mild peribronchial thickening. Unremarkable bones. IMPRESSION: 1. Endotracheal tube in satisfactory position. 2. Stable mild cardiomegaly with interval mild bronchitic changes. Electronically Signed   By: Beckie SaltsSteven  Reid M.D.   On: 11/29/2018 20:17   Dg Chest Port 1 View  Result Date: 11/29/2018 CLINICAL DATA:  Shortness of breath EXAM: PORTABLE CHEST 1 VIEW COMPARISON:  October 25, 2017 FINDINGS: No edema or consolidation. Heart is mildly enlarged with pulmonary vascularity normal. No adenopathy. No bone lesions. IMPRESSION: No edema or consolidation.  Mild cardiac enlargement. Electronically Signed   By: Bretta BangWilliam  Woodruff III M.D.   On: 11/29/2018 07:51     CBC Recent Labs  Lab 11/29/18 0739 11/29/18 1741 11/29/18 2051 11/30/18 0514 12/01/18 0443 12/03/18 0427  WBC 9.6 7.6 6.0 13.9* 18.9* 13.9*  HGB 14.7 13.7 13.3 13.1 13.6 12.6*  HCT 46.5 44.2 43.8 42.0 43.7 39.5  PLT 308 246 269 256 266 217  MCV 91.7 94.6 96.7 92.7 92.8 90.6  MCH 29.0 29.3 29.4 28.9 28.9 28.9  MCHC 31.6 31.0 30.4 31.2 31.1 31.9  RDW 13.9 14.0 14.2 14.2 14.6 13.8  LYMPHSABS 0.2*  --   --   --   --   --  MONOABS 0.5  --   --   --   --   --   EOSABS 0.0  --   --   --   --   --   BASOSABS 0.0  --   --   --   --   --     Chemistries  Recent Labs  Lab 11/29/18 2051 11/30/18 0514 11/30/18 0912  11/30/18 2155 12/01/18 0443 12/02/18 0609 12/02/18 2339 12/03/18 0427  NA 142 143  --  142 140 136 137  --   K 5.9* 3.9  --  4.2 4.3 4.5 4.3  --   CL 104 107  --  106 105 101 102  --   CO2 32 29  --  31 30 29 30   --   GLUCOSE 105* 175*  --  185* 191* 231* 265*  --   BUN 37* 38*  --  43* 48* 61* 45*  --   CREATININE 2.11* 1.59*  --  1.56* 1.71* 1.73* 1.33*  --   CALCIUM 7.7* 7.4*  --  7.2* 7.4* 7.5* 7.8*  --   MG 2.5*  --   --  2.3 2.4 2.6* 2.8*  --   AST 222*  --  446*  --   --   --   --  49*  ALT 154*  --  301*  --   --   --   --  203*  ALKPHOS 49  --  47  --   --   --   --  46  BILITOT 0.9  --  0.9  --   --   --   --  0.6   ------------------------------------------------------------------------------------------------------------------ estimated creatinine clearance is 80.4 mL/min (A) (by C-G formula based on SCr of 1.33 mg/dL (H)). ------------------------------------------------------------------------------------------------------------------ No results for input(s): HGBA1C in the last 72 hours. ------------------------------------------------------------------------------------------------------------------ No results for input(s): CHOL, HDL, LDLCALC, TRIG, CHOLHDL, LDLDIRECT in the last 72 hours. ------------------------------------------------------------------------------------------------------------------ No results for input(s): TSH, T4TOTAL, T3FREE, THYROIDAB in the last 72 hours.  Invalid input(s): FREET3 ------------------------------------------------------------------------------------------------------------------ No results for input(s): VITAMINB12, FOLATE, FERRITIN, TIBC, IRON, RETICCTPCT in the last 72 hours.  Coagulation profile Recent Labs  Lab 11/29/18 0739 11/29/18 2051  INR 1.25 1.30    No results for input(s): DDIMER in the last 72 hours.  Cardiac Enzymes Recent Labs  Lab 11/29/18 1019 11/29/18 1741 11/29/18 2051  TROPONINI 0.74* 1.42*  1.62*   ------------------------------------------------------------------------------------------------------------------ Invalid input(s): POCBNP    Assessment & Plan   *Acute respiratory failure with hypoxia COPD exacerbation Secondary to influenza A Continue Tamiflu Continue nebulizer therapy  *Elevated troponin-patient denies any chest pain.  He has some sinus tachycardia on EKG due to respiratory failure but no other changes. Seen by cardiology no ACS Heparin has been discontinued Continue aspirin and carvedilol.  *Hypotension continue pressors  *Acute renal insufficiency Renal function improved  *Chronic systolic congestive heart failure Chest x-ray is clear and does not have symptoms of CHF exacerbation currently.  Stable Ejection fraction 30% as well past records, continue to monitor.  *Diabetes mellitus Hold metformin due to worsening renal function Keep on sliding scale coverage.  *Active smoking Counseled to quit smoking   Likely discharge tomorrow     Code Status Orders  (From admission, onward)         Start     Ordered   11/29/18 1256  Full code  Continuous     11/29/18 1255        Code Status History  Date Active Date Inactive Code Status Order ID Comments User Context   11/15/2017 2242 11/16/2017 2016 Full Code [id number]  Oralia Manis, MD Inpatient   09/16/2017 1534 09/18/2017 1440 Full Code [id number]  Alford Highland, MD ED           Consults none  DVT Prophylaxis Heparin Lab Results  Component Value Date   PLT 217 12/03/2018     Time Spent in minutes   Greater than 50% of time spent in care coordination and counseling patient regarding the condition and plan of care.   Auburn Bilberry M.D on 12/03/2018 at 2:41 PM  Between 7am to 6pm - Pager - [phone]  After 6pm go to [email or web address] - Social research officer, government  [employer]   Office  [phone]

## 2018-12-03 NOTE — Progress Notes (Signed)
Pt had a 28 beat run of vtach during the night. NP made aware. Labs were drawn to check electrolytes.  No new orders were given.

## 2018-12-03 NOTE — Progress Notes (Signed)
PT Cancellation Note  Patient Details Name: Shonta Meli MRN: [id number] DOB: 17-Aug-1966   Cancelled Treatment:    Reason Eval/Treat Not Completed: Patient declined, no reason specified(Consult received and chart reviewed.  Patient declining participation with therapy at this time, stating "I've already walked today".  Unable to redirect (to edge of bed, OOB to recliner) despite encouragement.  Will continue efforts next date.  Of note, walker left in room for use with nursing as needed; nursing reporting patient generally unsteady and needing assist when up earlier in the day.)   Harley Mccartney H. Manson Passey, PT, DPT, NCS 12/03/18, 2:33 PM [phone]

## 2018-12-03 NOTE — Plan of Care (Signed)
Patient remains MedSurg status.  Episodes of suspected V. tach occurred the day prior not this morning.  The patient had refused his beta-blocker at the time.  He has since restarted taking his medications.  He has known cardiomyopathy with EF of 20 to 25%.  Very debilitating schizophrenia.  He is chronically on Haldol.  He is now he has now been more cooperative with taking his medications.  Awaiting MedSurg bed with monitoring.  Remain available as needed.

## 2018-12-03 NOTE — Progress Notes (Signed)
Patient refuses Medications and oxygen at this time. Patient is educated to not get up without calling but patient insists on doing his own patient care.

## 2018-12-03 NOTE — Care Management (Signed)
Per Dr. Allena Katz, patient may be ready for discharge tomorrow.  CSW updated.

## 2018-12-03 NOTE — Progress Notes (Signed)
Inpatient Diabetes Program Recommendations  AACE/ADA: New Consensus Statement on Inpatient Glycemic Control (2015)  Target Ranges:  Prepandial:   less than 140 mg/dL      Peak postprandial:   less than 180 mg/dL (1-2 hours)      Critically ill patients:  140 - 180 mg/dL   Results for APOLONIO, WHITON (MRN [id number]) as of 12/03/2018 07:52  Ref. Range 12/02/2018 07:22 12/02/2018 11:46 12/02/2018 15:57 12/02/2018 21:07  Glucose-Capillary Latest Ref Range: 70 - 99 mg/dL 485 (H)  3 units NOVOLOG  261 (H)  5 units NOVOLOG  309 (H)  7 units NOVOLOG  281 (H)  3 units NOVOLOG    Results for JUNNIE, AKEL (MRN [id number]) as of 12/03/2018 07:52  Ref. Range 12/03/2018 07:16  Glucose-Capillary Latest Ref Range: 70 - 99 mg/dL 938 (H)    Home DM Meds: Farxiga 5 mg daily                             Metformin 1500 mg AM/ 1000 mg PM   Current Orders: Novolog Sensitive Correction Scale/ SSI (0-9 units) TID AC + HS      Solumedrol reduced to 40 mg BID yesterday.  CBGs still running >200 mg/dl.    MD- Please consider the following in-hospital insulin adjustments while patient remains on IV steroids:  1. Start Lantus 10 units QHS (0.1 units/kg dosing based on weight of 99 kg)  2. Start Novolog Meal Coverage: Novolog 3 units TID with meals  (Please add the following Hold Parameters: Hold if pt eats <50% of meal, Hold if pt NPO)    --Will follow patient during hospitalization--  Ambrose Finland RN, MSN, CDE Diabetes Coordinator Inpatient Glycemic Control Team Team Pager: [phone] (8a-5p)

## 2018-12-04 ENCOUNTER — Inpatient Hospital Stay: Payer: Medicare Other

## 2018-12-04 LAB — GLUCOSE, CAPILLARY
Glucose-Capillary: 214 mg/dL — ABNORMAL HIGH (ref 70–99)
Glucose-Capillary: 218 mg/dL — ABNORMAL HIGH (ref 70–99)

## 2018-12-04 MED ORDER — AMLODIPINE BESYLATE 5 MG PO TABS
5.0000 mg | ORAL_TABLET | Freq: Every day | ORAL | Status: DC
  Administered 2018-12-04: 5 mg via ORAL
  Filled 2018-12-04: qty 1

## 2018-12-04 NOTE — Care Management Note (Signed)
Case Management Note  Patient Details  Name: Paul Hernandez MRN: [id number] Date of Birth: [birth year]  Subjective/Objective:   Patient admitted with acute respiratory failure, active smoker and flu positive.  Patient may discharge from hospital today.  Patient is from Group [facility]. RNCM notified Liborio Nixon the facility director that patient may discharge today, the facility will accept him back at discharge.  Facility will provide transportation at discharge just call Liborio Nixon at [phone].  [facility] will provide patient with a rolling walker, since being sick he has been weak and needs the support of the walker.  Robbie Lis RN BSN [phone]                  Action/Plan:   Expected Discharge Date:                  Expected Discharge Plan:  Group Home  In-House Referral:     Discharge planning Services     Post Acute Care Choice:  Durable Medical Equipment Choice offered to:  Patient  DME Arranged:  Dan Humphreys rolling DME Agency:  [facility]  HH Arranged:    [facility] Agency:     Status of Service:  Completed, signed off  If discussed at Long Length of Stay Meetings, dates discussed:    Additional Comments:  Allayne Butcher, RN 12/04/2018, 11:32 AM

## 2018-12-04 NOTE — Progress Notes (Signed)
Paul Hernandez contacted for patient discharge. Patient is pacing room and fully dressed, asking to leave.

## 2018-12-04 NOTE — Progress Notes (Signed)
Inpatient Diabetes Program Recommendations  AACE/ADA: New Consensus Statement on Inpatient Glycemic Control  Target Ranges:  Prepandial:   less than 140 mg/dL      Peak postprandial:   less than 180 mg/dL (1-2 hours)      Critically ill patients:  140 - 180 mg/dL   Results for Paul Hernandez, Paul Hernandez (MRN [id number]) as of 12/04/2018 13:48  Ref. Range 12/03/2018 07:16 12/03/2018 11:53 12/03/2018 15:48 12/03/2018 22:18 12/04/2018 07:42 12/04/2018 11:50  Glucose-Capillary Latest Ref Range: 70 - 99 mg/dL 956203 (H) 213229 (H) 086194 (H) 201 (H) 214 (H) 218 (H)   Review of Glycemic Control  Diabetes history: DM2 Outpatient Diabetes medications: Farxiga 5 mg daily, Metformin 1500 mg QAM, Metformin 1000 mg QPM Current orders for Inpatient glycemic control: Novolog 0-9 units TID with meals, Novolog 0-5 units QHS  Inpatient Diabetes Program Recommendations:  Insulin - Meal Coverage: If patient will remain inpatient today, please consider ordering Novolog 3 units TID with meals for meal coverage if patient eats at least 50% of meals.  NOTE: Noted steroids were discontinued.  Thanks, Orlando PennerMarie Rose Hegner, RN, MSN, CDE Diabetes Coordinator Inpatient Diabetes Program [phone] (Team Pager from 8am to 5pm)

## 2018-12-04 NOTE — Clinical Social Work Note (Signed)
CSW attempted to contact [facility] group home director Liborio Nixon [phone], and left a message on voice mail awaiting for call back.  Ervin Knack. Temeca Somma, MSW, Theresia Majors [phone]  12/04/2018 12:10 PM

## 2018-12-04 NOTE — [place] FL2 (Signed)
[place] MEDICAID FL2 LEVEL OF CARE SCREENING TOOL     IDENTIFICATION  Patient Name: Paul Hernandez Birthdate: 09/27/1966 Sex: male Admission Date (Current Location): 11/29/2018  [place]  [place] [id number] [facility] Facility and Address:  [facility], [address], [place], [place] [address]      Provider Number: [id number]  Attending Physician Name and Address:  No att. providers found  Relative Name and Phone Number:  Jodi GeraldsJohn Graves [facility](Friend) [phone]; Azzie RoupLisa Graves [phone](Friend) [phone]; Raenette Roverndrew [facility](Uncle) [phone]    Current Level of Care: Hospital Recommended Level of Care: Family Care Home Prior Approval Number:    Date Approved/Denied:   PASRR Number:    Discharge Plan: Domiciliary (Rest home)([facility])    Current Diagnoses: Patient Active Problem List   Diagnosis Date Noted  . Acute respiratory failure with hypoxia (HCC) 11/29/2018  . Influenza A 11/29/2018  . Hyperlipidemia, unspecified 11/29/2017  . Hypertension 11/29/2017  . Schizophrenia (HCC) 11/16/2017  . COPD (chronic obstructive pulmonary disease) (HCC) 11/15/2017  . Mental health disorder 11/15/2017  . Cerebral ischemia 11/15/2017  . Cerebral infarct (HCC) 11/15/2017  . Benign essential HTN 11/13/2017  . Diabetes type 2, controlled (HCC) 11/13/2017  . COPD exacerbation (HCC) 09/16/2017    Orientation RESPIRATION BLADDER Height & Weight     Self, Time, Situation, Place    Continent Weight: 220 lb (99.8 kg) Height:  6' (182.9 cm)  BEHAVIORAL SYMPTOMS/MOOD NEUROLOGICAL BOWEL NUTRITION STATUS      Continent Diet(NPO to be advanced. Please see discharge summary.)  AMBULATORY STATUS COMMUNICATION OF NEEDS Skin   Independent Verbally Normal                       Personal Care Assistance Level of Assistance  Bathing, Feeding, Dressing Bathing Assistance: Independent Feeding assistance: Independent Dressing Assistance:  Independent     Functional Limitations Info  Sight, Hearing, Speech Sight Info: Adequate Hearing Info: Adequate Speech Info: Adequate    SPECIAL CARE FACTORS FREQUENCY                       Contractures Contractures Info: Not present    Additional Factors Info  Code Status, Allergies, Psychotropic Code Status Info: Full Allergies Info: Asa (Aspirin) Psychotropic Info: Haldol         Current Medications (12/04/2018):  This is the current hospital active medication list Current Facility-Administered Medications  Medication Dose Route Frequency Provider Last Rate Last Dose  . 0.9 %  sodium chloride infusion   Intravenous PRN Tukov-Yual, Alroy BailiffMagdalene S, NP   Stopped at 11/30/18 0045  . amLODipine (NORVASC) tablet 5 mg  5 mg Oral Daily Salena SanerGonzalez, Carmen L, MD   5 mg at 12/04/18 1300  . aspirin EC tablet 325 mg  325 mg Oral Daily Altamese DillingVachhani, Vaibhavkumar, MD   325 mg at 12/04/18 0917  . benztropine (COGENTIN) tablet 2 mg  2 mg Oral QHS Altamese DillingVachhani, Vaibhavkumar, MD   2 mg at 12/03/18 2130  . bisacodyl (DULCOLAX) suppository 10 mg  10 mg Rectal Daily PRN Tukov-Yual, Magdalene S, NP      . budesonide (PULMICORT) nebulizer solution 0.5 mg  0.5 mg Nebulization BID Altamese DillingVachhani, Vaibhavkumar, MD   0.5 mg at 12/04/18 0743  . carvedilol (COREG) tablet 25 mg  25 mg Oral BID WC Altamese DillingVachhani, Vaibhavkumar, MD   25 mg at 12/04/18 0917  . diphenhydrAMINE (BENADRYL) capsule 25 mg  25 mg Oral QHS  Altamese Dilling, MD   25 mg at 12/03/18 2129  . docusate sodium (COLACE) capsule 100 mg  100 mg Oral BID PRN Altamese Dilling, MD      . enoxaparin (LOVENOX) injection 40 mg  40 mg Subcutaneous Q24H Salena Saner, MD   40 mg at 12/03/18 2130  . ezetimibe (ZETIA) tablet 10 mg  10 mg Oral Daily Altamese Dilling, MD   10 mg at 12/04/18 0920  . fluticasone (FLONASE) 50 MCG/ACT nasal spray 1 spray  1 spray Each Nare Daily Altamese Dilling, MD   1 spray at 12/02/18 0913  . haloperidol  (HALDOL) tablet 5 mg  5 mg Oral BID Mariel Craft, MD   5 mg at 12/04/18 0920  . haloperidol lactate (HALDOL) injection 5 mg  5 mg Intravenous BID PRN Salena Saner, MD   5 mg at 12/02/18 1548  . insulin aspart (novoLOG) injection 0-5 Units  0-5 Units Subcutaneous QHS Altamese Dilling, MD   3 Units at 12/02/18 2145  . insulin aspart (novoLOG) injection 0-9 Units  0-9 Units Subcutaneous TID WC Altamese Dilling, MD   3 Units at 12/04/18 1250  . ipratropium-albuterol (DUONEB) 0.5-2.5 (3) MG/3ML nebulizer solution 3 mL  3 mL Nebulization TID Salena Saner, MD   3 mL at 12/04/18 1347  . isosorbide-hydrALAZINE (BIDIL) 20-37.5 MG per tablet 1 tablet  1 tablet Oral TID Altamese Dilling, MD   1 tablet at 12/04/18 0920  . losartan (COZAAR) tablet 50 mg  50 mg Oral Daily Altamese Dilling, MD   50 mg at 12/04/18 0917  . MEDLINE mouth rinse  15 mL Mouth Rinse BID Vida Rigger, MD   15 mL at 12/01/18 2136  . midazolam (VERSED) injection 2-4 mg  2-4 mg Intravenous Q15 min PRN Tukov-Yual, Magdalene S, NP   2 mg at 11/29/18 2310  . midazolam (VERSED) injection 2-4 mg  2-4 mg Intravenous Q2H PRN Tukov-Yual, Magdalene S, NP      . nitroGLYCERIN (NITROSTAT) SL tablet 0.4 mg  0.4 mg Sublingual Q5 min PRN Altamese Dilling, MD      . pantoprazole (PROTONIX) EC tablet 40 mg  40 mg Oral Daily Gardner Candle, RPH   40 mg at 12/03/18 2129  . senna (SENOKOT) tablet 8.6 mg  1 tablet Oral BID PRN Gardner Candle, RPH      . senna-docusate (Senokot-S) tablet 1 tablet  1 tablet Oral QHS Sarina Ser L, MD      . sodium chloride flush (NS) 0.9 % injection 10-40 mL  10-40 mL Intracatheter Q12H Tukov-Yual, Magdalene S, NP   10 mL at 12/04/18 0927  . sodium chloride flush (NS) 0.9 % injection 10-40 mL  10-40 mL Intracatheter PRN Tukov-Yual, Magdalene S, NP      . tiotropium (SPIRIVA) inhalation capsule ([facility] use ONLY) 18 mcg  18 mcg Inhalation Daily Altamese Dilling, MD   18  mcg at 12/02/18 0915   Current Outpatient Medications  Medication Sig Dispense Refill  . albuterol (PROVENTIL HFA;VENTOLIN HFA) 108 (90 Base) MCG/ACT inhaler Inhale 2 puffs into the lungs every 6 (six) hours as needed for wheezing or shortness of breath. (Patient taking differently: Inhale 1-2 puffs into the lungs every 4 (four) hours as needed for wheezing or shortness of breath. ) 1 Inhaler 2  . albuterol (PROVENTIL) (2.5 MG/3ML) 0.083% nebulizer solution Take 2.5 mg by nebulization 4 (four) times daily as needed for wheezing or shortness of breath.     Marland Kitchen amLODipine (  NORVASC) 5 MG tablet Take 10 mg by mouth daily.    . Azilsartan-Chlorthalidone (EDARBYCLOR) 40-12.5 MG TABS Take 1 tablet by mouth daily.    . benztropine (COGENTIN) 2 MG tablet Take 2 mg by mouth at bedtime.    . carvedilol (COREG) 25 MG tablet Take 1 tablet (25 mg total) by mouth 2 (two) times daily with a meal. 60 tablet 0  . Cholecalciferol (VITAMIN D3) 5000 units CAPS Take 5,000 Units by mouth daily.    . dapagliflozin propanediol (FARXIGA) 5 MG TABS tablet Take 5 mg by mouth daily.    . diphenhydrAMINE (BENADRYL) 25 mg capsule Take 25 mg by mouth at bedtime.     Marland Kitchen ezetimibe (ZETIA) 10 MG tablet Take 10 mg by mouth daily.    . fluticasone (FLONASE) 50 MCG/ACT nasal spray Place 1 spray into both nostrils daily.    Marland Kitchen guaifenesin (ROBITUSSIN) 100 MG/5ML syrup Take 200 mg by mouth every 4 (four) hours as needed for cough.    . haloperidol decanoate (HALDOL DECANOATE) 100 MG/ML injection Inject 90 mg into the muscle every 28 (twenty-eight) days.     . isosorbide-hydrALAZINE (BIDIL) 20-37.5 MG tablet Take 1 tablet by mouth 3 (three) times daily.    . rosuvastatin (CRESTOR) 40 MG tablet Take 40 mg by mouth daily.    Marland Kitchen umeclidinium-vilanterol (ANORO ELLIPTA) 62.5-25 MCG/INH AEPB Inhale 1 puff into the lungs daily.    Marland Kitchen aspirin EC 325 MG tablet Take 1 tablet (325 mg total) by mouth daily. 30 tablet 0  . benazepril (LOTENSIN) 20 MG  tablet Take 1 tablet (20 mg total) by mouth daily. 30 tablet 1  . dextrose (GLUTOSE) 40 % GEL Take by mouth once as needed for low blood sugar. For blood sugar <70       Discharge Medications: STOP taking these medications       metFORMIN 500 MG tablet Commonly known as:  GLUCOPHAGE   triamterene-hydrochlorothiazide 75-50 MG tablet Commonly known as:  MAXZIDE             TAKE these medications       albuterol (2.5 MG/3ML) 0.083% nebulizer solution Commonly known as:  PROVENTIL Take 2.5 mg by nebulization 4 (four) times daily as needed for wheezing or shortness of breath. What changed:  Another medication with the same name was changed. Make sure you understand how and when to take each.   albuterol 108 (90 Base) MCG/ACT inhaler Commonly known as:  PROVENTIL HFA;VENTOLIN HFA Inhale 2 puffs into the lungs every 6 (six) hours as needed for wheezing or shortness of breath. What changed:    how much to take  when to take this   amLODipine 5 MG tablet Commonly known as:  NORVASC Take 10 mg by mouth daily.   ANORO ELLIPTA 62.5-25 MCG/INH Aepb Generic drug:  umeclidinium-vilanterol Inhale 1 puff into the lungs daily.   aspirin EC 325 MG tablet Take 1 tablet (325 mg total) by mouth daily.   benazepril 20 MG tablet Commonly known as:  LOTENSIN Take 1 tablet (20 mg total) by mouth daily.   benztropine 2 MG tablet Commonly known as:  COGENTIN Take 2 mg by mouth at bedtime.   carvedilol 25 MG tablet Commonly known as:  COREG Take 1 tablet (25 mg total) by mouth 2 (two) times daily with a meal.   dextrose 40 % Gel Commonly known as:  GLUTOSE Take by mouth once as needed for low blood sugar. For blood sugar <70  diphenhydrAMINE 25 mg capsule Commonly known as:  BENADRYL Take 25 mg by mouth at bedtime.   EDARBYCLOR 40-12.5 MG Tabs Generic drug:  Azilsartan-Chlorthalidone Take 1 tablet by mouth daily.   ezetimibe 10 MG tablet Commonly known as:   ZETIA Take 10 mg by mouth daily.   FARXIGA 5 MG Tabs tablet Generic drug:  dapagliflozin propanediol Take 5 mg by mouth daily.   fluticasone 50 MCG/ACT nasal spray Commonly known as:  FLONASE Place 1 spray into both nostrils daily.   guaifenesin 100 MG/5ML syrup Commonly known as:  ROBITUSSIN Take 200 mg by mouth every 4 (four) hours as needed for cough.   haloperidol decanoate 100 MG/ML injection Commonly known as:  HALDOL DECANOATE Inject 90 mg into the muscle every 28 (twenty-eight) days.   isosorbide-hydrALAZINE 20-37.5 MG tablet Commonly known as:  BIDIL Take 1 tablet by mouth 3 (three) times daily.   rosuvastatin 40 MG tablet Commonly known as:  CRESTOR Take 40 mg by mouth daily.   Vitamin D3 125 MCG (5000 UT) Caps Take 5,000 Units by mouth daily.   Relevant Imaging Results:  Relevant Lab Results:   Additional Information SS# [id number]  [place]

## 2018-12-04 NOTE — Progress Notes (Signed)
PT Cancellation Note  Patient Details Name: Paul Hernandez MRN: [id number] DOB: June 23, 1966   Cancelled Treatment:    Reason Eval/Treat Not Completed: Patient declined, no reason specified(Patient seated in chair upon arrival to room.  Continues to refuse re-attempt at PT evaluation/mobility assessment.  Unable to redirect despite encouragement.  ASCOM number left with nursing, to call if patient is agreeable to mobility later in the day. Of note, primary nurse does report patient ambulatory with RW previous afternoon with noted improvement in safety/stability.)   Deandrae Wajda H. Manson Passey, PT, DPT, NCS 12/04/18, 11:39 AM [phone]

## 2018-12-04 NOTE — Progress Notes (Signed)
Durward Parcel to be D/C'd Home per MD. Discussed with the patient and all questions fully answered.   VSS, skin clean, dry and intact without evidence of skin break down, no evidence of skin tears noted.  IV catheters discontinued intact. Site without signs and symptoms of complications. Dressing and pressure applied.   An after visit summary was printed and given to the patient.    D/c education completed with patient/family including follow up instructions, medications list, d/c activities limitations if indicated, with other d/c instructions as indicated by MD- patient able to verbalize understanding, all questions fully answered.   Patient instructed to return to ED, call 911, or Call MD for any changes in condition.  Patient escorted via [facility] and D/C home via private auto.

## 2018-12-04 NOTE — Discharge Instructions (Signed)
Acute Respiratory Distress Syndrome, Adult ° °Acute respiratory distress syndrome is a life-threatening condition in which fluid collects in the lungs. This prevents the lungs from filling with air and passing oxygen into the blood. This can cause the lungs and other vital organs to fail. The condition usually develops following an infection, illness, surgery, or injury. °What are the causes? °This condition may be caused by: °· An infection, such as sepsis or pneumonia. °· A serious injury to the head or chest. °· Severe bleeding from an injury. °· A major surgery. °· Breathing in harmful chemicals or smoke. °· Blood transfusions. °· A blood clot in the lungs. °· Breathing in vomit (aspiration). °· Near-drowning. °· Inflammation of the pancreas (pancreatitis). °· A drug overdose. °What are the signs or symptoms? °Sudden shortness of breath and rapid breathing are the main symptoms of this condition. Other symptoms may include: °· A fast or irregular heartbeat. °· Skin, lips, or fingernails that look blue (cyanosis). °· Confusion. °· Tiredness or loss of energy. °· Chest pain, particularly while taking a breath. °· Coughing. °· Restlessness or anxiety. °· Fever. This is usually present if there is an underlying infection, such as pneumonia. °How is this diagnosed? °This condition is diagnosed based on: °· Your symptoms. °· Medical history. °· A physical exam. During the exam, your health care provider will listen to your heart and check for crackling or wheezing sounds in your lungs. °You may also have other tests to confirm the diagnosis and measure how well your lungs are working. These may include: °· Measuring the amount of oxygen in your blood. Your health care provider will use two methods to do this procedure: °? A small device (pulse oximeter) that is placed on your finger, earlobe, or toe. °? An arterial blood gas test. A sample of blood is taken from an artery and tested for oxygen levels. °· Blood  tests. °· Chest X-rays or CT scans to look for fluid in the lungs. °· Taking a sample of your sputum to test for infection. °· Heart test, such as an echocardiogram or electrocardiogram. This is done to rule out any heart problems (such as heart failure) that may be causing your symptoms. °· Bronchoscopy. During this test, a thin, flexible tube with a light is passed into the mouth or nose, down the windpipe, and into the lungs. °How is this treated? °Treatment depends on the cause of your condition. The goal is to support you while your lungs heal and the underlying cause is treated. Treatment may include: °· Oxygen therapy. This may be done through: °? A tube in your nose or a face mask. °? A ventilator. This device helps move air into and out of your lungs through a breathing tube that is inserted into your mouth or nose. °· Continuous positive airway pressure (CPAP). This treatment uses mild air pressure to keep the airways open. A mask or other device will be placed over your nose or mouth. °· Tracheostomy. During this procedure, a small cut is made in your neck to create an opening to your windpipe. A breathing tube is placed directly into your windpipe. The breathing tube is connected to a ventilator. This is done if you have problems with your airway or if you need a ventilator for a long period of time. °· Positioning you to lie on your stomach (prone position). °· Medicines, such as: °? Sedatives to help you relax. °? Blood pressure medicines. °? Antibiotics to treat infection. °? Blood   thinners to prevent blood clots. ? Diuretics to help prevent excess fluid.  Fluids and nutrients given through an IV tube.  Wearing compression stockings on your legs to prevent blood clots.  Extra corporeal membrane oxygenation (ECMO). This treatment takes blood outside your body, adds oxygen, and removes carbon dioxide. The blood is then returned to your body. This treatment is only used in severe cases. Follow  these instructions at home:  Take over-the-counter and prescription medicines only as told by your health care provider.  Do not use any products that contain nicotine or tobacco, such as cigarettes and e-cigarettes. If you need help quitting, ask your health care provider.  Limit alcohol intake to no more than 1 drink per day for nonpregnant women and 2 drinks per day for men. One drink equals 12 oz of beer, 5 oz of wine, or 1 oz of hard liquor.  Ask friends and family to help you if daily activities make you tired.  Attend any pulmonary rehabilitation as told by your health care provider. This may include: ? Education about your condition. ? Exercises. ? Breathing training. ? Counseling. ? Learning techniques to conserve energy. ? Nutrition counseling.  Keep all follow-up visits as told by your health care provider. This is important. Contact a health care provider if:  You become short of breath during activity or while resting.  You develop a cough that does not go away.  You have a fever.  Your symptoms do not get better or they get worse.  You become anxious or depressed. Get help right away if:  You have sudden shortness of breath.  You develop sudden chest pain that does not go away.  You develop a rapid heart rate.  You develop swelling or pain in one of your legs.  You cough up blood.  You have trouble breathing.  Your skin, lips, or fingernails turn blue. These symptoms may represent a serious problem that is an emergency. Do not wait to see if the symptoms will go away. Get medical help right away. Call your local emergency services (911 in the [place] Do not drive yourself to the hospital. Summary  Acute respiratory distress syndrome is a life-threatening condition in which fluid collects in the lungs, which leads the lungs and other vital organs to fail.  This condition usually develops following an infection, illness, surgery, or injury.  Sudden  shortness of breath and rapid breathing are the main symptoms of acute respiratory distress syndrome.  Treatment may include oxygen therapy, continuous positive airway pressure (CPAP), tracheostomy, lying on your stomach (prone position), medicines, fluids and nutrients given through an IV tube, compression stockings, and extra corporeal membrane oxygenation (ECMO). This information is not intended to replace advice given to you by your health care provider. Make sure you discuss any questions you have with your health care provider. Document Released: 10/09/2005 Document Revised: 09/25/2016 Document Reviewed: 09/25/2016 Elsevier Interactive Patient Education  2019 [employer] Inc.   COPD and Physical Activity Chronic obstructive pulmonary disease (COPD) is a long-term (chronic) condition that affects the lungs. COPD is a general term that can be used to describe many different lung problems that cause lung swelling (inflammation) and limit airflow, including chronic bronchitis and emphysema. The main symptom of COPD is shortness of breath, which makes it harder to do even simple tasks. This can also make it harder to exercise and be active. Talk with your health care provider about treatments to help you breathe better and actions you can  take to prevent breathing problems during physical activity. What are the benefits of exercising with COPD? Exercising regularly is an important part of a healthy lifestyle. You can still exercise and do physical activities even though you have COPD. Exercise and physical activity improve your shortness of breath by increasing blood flow (circulation). This causes your heart to pump more oxygen through your body. Moderate exercise can improve your:  Oxygen use.  Energy level.  Shortness of breath.  Strength in your breathing muscles.  Heart health.  Sleep.  Self-esteem and feelings of self-worth.  Depression, stress, and anxiety levels. Exercise can  benefit everyone with COPD. The severity of your disease may affect how hard you can exercise, especially at first, but everyone can benefit. Talk with your health care provider about how much exercise is safe for you, and which activities and exercises are safe for you. What actions can I take to prevent breathing problems during physical activity?  Sign up for a pulmonary rehabilitation program. This type of program may include: ? Education about lung diseases. ? Exercise classes that teach you how to exercise and be more active while improving your breathing. This usually involves:  Exercise using your lower extremities, such as a stationary bicycle.  About 30 minutes of exercise, 2 to 5 times per week, for 6 to 12 weeks  Strength training, such as push ups or leg lifts. ? Nutrition education. ? Group classes in which you can talk with others who also have COPD and learn ways to manage stress.  If you use an oxygen tank, you should use it while you exercise. Work with your health care provider to adjust your oxygen for your physical activity. Your resting flow rate is different from your flow rate during physical activity.  While you are exercising: ? Take slow breaths. ? Pace yourself and do not try to go too fast. ? Purse your lips while breathing out. Pursing your lips is similar to a kissing or whistling position. ? If doing exercise that uses a quick burst of effort, such as weight lifting:  Breathe in before starting the exercise.  Breathe out during the hardest part of the exercise (such as raising the weights). Where to find support You can find support for exercising with COPD from:  Your health care provider.  A pulmonary rehabilitation program.  Your local health department or community health programs.  Support groups, online or in-person. Your health care provider may be able to recommend support groups. Where to find more information You can find more information  about exercising with COPD from:  American Lung Association: [place].org.  COPD Foundation: [employer].glcopdfoundation.org. Contact a health care provider if:  Your symptoms get worse.  You have chest pain.  You have nausea.  You have a fever.  You have trouble talking or catching your breath.  You want to start a new exercise program or a new activity. Summary  COPD is a general term that can be used to describe many different lung problems that cause lung swelling (inflammation) and limit airflow. This includes chronic bronchitis and emphysema.  Exercise and physical activity improve your shortness of breath by increasing blood flow (circulation). This causes your heart to provide more oxygen to your body.  Contact your health care provider before starting any exercise program or new activity. Ask your health care provider what exercises and activities are safe for you. This information is not intended to replace advice given to you by your health care provider.  Make sure you discuss any questions you have with your health care provider. Document Released: 11/01/2017 Document Revised: 11/01/2017 Document Reviewed: 11/01/2017 Elsevier Interactive Patient Education  2019 [employer].   Cardiac Rehabilitation What is cardiac rehabilitation? Cardiac rehabilitation is a treatment program that helps improve the health and well-being of people who have heart problems. Cardiac rehabilitation includes exercise training, education, and counseling to help you get stronger and return to an active lifestyle. This program can help you get better faster and reduce any future hospital stays. Why might I need cardiac rehabilitation? Cardiac rehabilitation programs can help when you have or have had:  A heart attack.  Heart failure.  Peripheral artery disease.  Coronary artery disease.  Angina.  Lung or breathing problems. Cardiac rehabilitation programs are also used when you have had:  Coronary  artery bypass graft surgery.  Heart valve replacement.  Heart stent placement.  Heart transplant.  Aneurysm repair. What are the benefits of cardiac rehabilitation? Cardiac rehabilitation can help:  Reduce problems like chest pain and trouble breathing.  Change risk factors that contribute to heart disease, such as: ? Smoking. ? High blood pressure. ? High cholesterol. ? Diabetes. ? Being out of shape or not active. ? Weighing more than 30% higher than your ideal weight. ? Diet.  Improve your mental outlook so you feel: ? More hopeful. ? Better about yourself. ? More confident about taking care of yourself.  Get support from health experts as well as other people with similar problems.  Learn how to manage and understand your medicines.  Teach your family about your condition and how to participate in your recovery. What happens in cardiac rehabilitation? You will be assessed by a cardiac rehabilitation team. They will check your health history and do a physical exam. You may need blood tests, stress tests, and other evaluations to make sure that you are ready to start cardiac rehabilitation. The cardiac rehabilitation team works with you to make a plan based on your health and goals. Your program will be tailored to fit you and your needs and may change as you progress. You may work with a health care team that includes:  Doctors.  Nurses.  Dietitians.  Psychologists.  Exercise specialists.  Physical and occupational therapists. What are the phases of cardiac rehabilitation? A cardiac rehabilitation program is often divided into phases. You advance from one phase to the next. Phase One  This phase starts while you are still in the hospital. You may start by walking in your room and then in the hall. You may start some simple exercises with a therapist. Phase Two  This phase begins when you go home or to another facility. This phase may last 8-12 weeks. You will  travel to a cardiac rehabilitation center or another place where rehabilitation is offered. You will slowly increase your activity level while being closely watched by a nurse or therapist. Exercises may include a combination of strength or resistance training and cardio or aerobic movement on a treadmill or other machines. Your condition will determine how often and how long these sessions last. In phase two, you may learn how to cook healthy meals, control your blood sugar, and manage your medicines. You may need help with scheduling or planning how and when to take your medicines. If you have questions about your medicines, it is very important that you talk to your health care provider. Phase Three This phase continues for the rest of your life. There will be less  supervision. You may still participate in cardiac rehabilitation activities or become part of a group in your community. You may benefit from talking about your experience with other people who are facing similar challenges. Get help right away if:  You have severe chest discomfort, especially if the pain is crushing or pressure-like and spreads to your arms, back, neck, or jaw. Do not wait to see if the pain will go away.  You have weakness or numbness in your face, arms, or legs, especially on one side of the body.  Your speech is slurred.  You are confused.  You have a sudden severe headache or loss of vision.  You have shortness of breath.  You are sweating and have nausea.  You feel dizzy or faint.  You are fatigued. These symptoms may represent a serious problem that is an emergency. Do not wait to see if the symptoms will go away. Get medical help right away. Call your local emergency services (911 in the [place] Do not drive yourself to the hospital. This information is not intended to replace advice given to you by your health care provider. Make sure you discuss any questions you have with your health care  provider. Document Released: 07/18/2008 Document Revised: 07/02/2018 Document Reviewed: 08/23/2015 Elsevier Interactive Patient Education  2019 [employer] Inc.   Heart Failure  Heart failure means your heart has trouble pumping blood. This makes it hard for your body to work well. Heart failure is usually a long-term (chronic) condition. You must take good care of yourself and follow your treatment plan from your doctor. Follow these instructions at home: Medicines  Take over-the-counter and prescription medicines only as told by your doctor. ? Do not stop taking your medicine unless your doctor told you to do that. ? Do not skip any doses. ? Refill your prescriptions before you run out of medicine. You need your medicines every day. Eating and drinking   Eat heart-healthy foods. Talk with a diet and nutrition specialist (dietitian) to make an eating plan.  Choose foods that: ? Have no trans fat. ? Are low in saturated fat and cholesterol.  Choose healthy foods, like: ? Fresh or frozen fruits and vegetables. ? Fish. ? Low-fat (lean) meats. ? Legumes (like beans, peas, and lentils). ? Fat-free or low-fat dairy products. ? Whole-grain foods. ? High-fiber foods.  Limit salt (sodium) if told by your doctor. Ask your nutrition specialist to recommend heart-healthy seasonings.  Cook in healthy ways instead of frying. Healthy ways of cooking include: ? Roasting. ? Grilling. ? Broiling. ? Baking. ? Poaching. ? Steaming. ? Stir-frying.  Limit how much fluid you drink, if told by your doctor. Lifestyle  Do not smoke or use chewing tobacco. Do not use nicotine gum or patches before talking to your doctor.  Limit alcohol intake to no more than 1 drink a day for non-pregnant women and 2 drinks a day for men. One drink equals 12 oz of beer, 5 oz of wine, or 1 oz of hard liquor. ? Tell your doctor if you drink alcohol many times a week. ? Talk with your doctor about whether any  alcohol is safe for you. ? You should stop drinking alcohol: ? If your heart has been damaged by alcohol. ? You have very bad heart failure.  Do not use illegal drugs.  Lose weight if told by your doctor.  Do moderate physical activity if told by your doctor. Ask your doctor what activities are safe for you if: ?  You are of older age (elderly). ? You have very bad heart failure. Keep track of important information  Weigh yourself every day. ? Weigh yourself every morning after you pee (urinate) and before breakfast. ? Wear the same amount of clothing each time. ? Write down your daily weight. Give your record to your doctor.  Check and write down your blood pressure as told by your doctor.  Check your pulse as told by your doctor. Dealing with heat and cold  If the weather is very hot: ? Avoid activity that takes a lot of energy. ? Use air conditioning or fans, or find a cooler place. ? Avoid caffeine. ? Avoid alcohol. ? Wear clothing that is loose-fitting, lightweight, and light-colored.  If the weather is very cold: ? Avoid activity that takes a lot of energy. ? Layer your clothes. ? Wear mittens or gloves, a hat, and a scarf when you go outside. ? Avoid alcohol. General instructions  Manage other conditions that you have as told by your doctor.  Learn to manage stress. If you need help, ask your doctor.  Plan rest periods for when you get tired.  Get education and support as needed.  Get rehab (rehabilitation) to help you stay independent and to help with everyday tasks.  Stay up to date with shots (immunizations), especially pneumococcal and flu (influenza) shots.  Keep all follow-up visits as told by your doctor. This is important. Contact a doctor if:  You gain weight quickly.  You are more short of breath than normal.  You cannot do your normal activities.  You tire easily.  You cough more than normal, especially with activity.  You have any or  more puffiness (swelling) in areas such as your hands, feet, ankles, or belly (abdomen).  You cannot sleep because it is hard to breathe.  You feel like your heart is beating fast (palpitations).  You get dizzy or light-headed when you stand up. Get help right away if:  You have trouble breathing.  You or someone else notices a change in your awareness. This could be trouble staying awake or trouble concentrating.  You have chest pain or discomfort.  You pass out (faint). Summary  Heart failure means your heart has trouble pumping blood.  Make sure you refill your prescriptions before you run out of medicine. You need your medicines every day.  Keep records of your weight and blood pressure to give to your doctor.  Contact a doctor if you gain weight quickly. This information is not intended to replace advice given to you by your health care provider. Make sure you discuss any questions you have with your health care provider. Document Released: 07/18/2008 Document Revised: 07/03/2018 Document Reviewed: 10/31/2016 Elsevier Interactive Patient Education  2019 [employer]   Influenza, Adult Influenza is also called "the flu." It is an infection in the lungs, nose, and throat (respiratory tract). It is caused by a virus. The flu causes symptoms that are similar to symptoms of a cold. It also causes a high fever and body aches. The flu spreads easily from person to person (is contagious). Getting a flu shot (influenza vaccination) every year is the best way to prevent the flu. What are the causes? This condition is caused by the influenza virus. You can get the virus by:  Breathing in droplets that are in the air from the cough or sneeze of a person who has the virus.  Touching something that has the virus on it (is contaminated)  and then touching your mouth, nose, or eyes. What increases the risk? Certain things may make you more likely to get the flu. These include:  Not  washing your hands often.  Having close contact with many people during cold and flu season.  Touching your mouth, eyes, or nose without first washing your hands.  Not getting a flu shot every year. You may have a higher risk for the flu, along with serious problems such as a lung infection (pneumonia), if you:  Are older than 65.  Are pregnant.  Have a weakened disease-fighting system (immune system) because of a disease or taking certain medicines.  Have a long-term (chronic) illness, such as: ? Heart, kidney, or lung disease. ? Diabetes. ? Asthma.  Have a liver disorder.  Are very overweight (morbidly obese).  Have anemia. This is a condition that affects your red blood cells. What are the signs or symptoms? Symptoms usually begin suddenly and last 4-14 days. They may include:  Fever and chills.  Headaches, body aches, or muscle aches.  Sore throat.  Cough.  Runny or stuffy (congested) nose.  Chest discomfort.  Not wanting to eat as much as normal (poor appetite).  Weakness or feeling tired (fatigue).  Dizziness.  Feeling sick to your stomach (nauseous) or throwing up (vomiting). How is this treated? If the flu is found early, you can be treated with medicine that can help reduce how bad the illness is and how long it lasts (antiviral medicine). This may be given by mouth (orally) or through an IV tube. Taking care of yourself at home can help your symptoms get better. Your doctor may suggest:  Taking over-the-counter medicines.  Drinking plenty of fluids. The flu often goes away on its own. If you have very bad symptoms or other problems, you may be treated in a hospital. Follow these instructions at home:     Activity  Rest as needed. Get plenty of sleep.  Stay home from work or school as told by your doctor. ? Do not leave home until you do not have a fever for 24 hours without taking medicine. ? Leave home only to visit your doctor. Eating and  drinking  Take an ORS (oral rehydration solution). This is a drink that is sold at pharmacies and stores.  Drink enough fluid to keep your pee (urine) pale yellow.  Drink clear fluids in small amounts as you are able. Clear fluids include: ? Water. ? Ice chips. ? Fruit juice that has water added (diluted fruit juice). ? Low-calorie sports drinks.  Eat bland, easy-to-digest foods in small amounts as you are able. These foods include: ? Bananas. ? Applesauce. ? Rice. ? Lean meats. ? Toast. ? Crackers.  Do not eat or drink: ? Fluids that have a lot of sugar or caffeine. ? Alcohol. ? Spicy or fatty foods. General instructions  Take over-the-counter and prescription medicines only as told by your doctor.  Use a cool mist humidifier to add moisture to the air in your home. This can make it easier for you to breathe.  Cover your mouth and nose when you cough or sneeze.  Wash your hands with soap and water often, especially after you cough or sneeze. If you cannot use soap and water, use alcohol-based hand sanitizer.  Keep all follow-up visits as told by your doctor. This is important. How is this prevented?   Get a flu shot every year. You may get the flu shot in late summer, fall, or  winter. Ask your doctor when you should get your flu shot.  Avoid contact with people who are sick during fall and winter (cold and flu season). Contact a doctor if:  You get new symptoms.  You have: ? Chest pain. ? Watery poop (diarrhea). ? A fever.  Your cough gets worse.  You start to have more mucus.  You feel sick to your stomach.  You throw up. Get help right away if you:  Have shortness of breath.  Have trouble breathing.  Have skin or nails that turn a bluish color.  Have very bad pain or stiffness in your neck.  Get a sudden headache.  Get sudden pain in your face or ear.  Cannot eat or drink without throwing up. Summary  Influenza ("the flu") is an infection  in the lungs, nose, and throat. It is caused by a virus.  Take over-the-counter and prescription medicines only as told by your doctor.  Getting a flu shot every year is the best way to avoid getting the flu. This information is not intended to replace advice given to you by your health care provider. Make sure you discuss any questions you have with your health care provider. Document Released: 07/18/2008 Document Revised: 03/27/2018 Document Reviewed: 03/27/2018 Elsevier Interactive Patient Education  2019 [employer].

## 2018-12-04 NOTE — Clinical Social Work Note (Signed)
CSW attempted to provide discharge summary and FL2 with family care home however patient had left already.  Ervin Knack. Judeth Gilles, MSW, Theresia Majors [phone]  12/04/2018 4:04 PM

## 2018-12-04 NOTE — Discharge Summary (Signed)
[facility] [place] [facility]  [name], [place]52 y.o., DOB 1966-04-15, MRN [id number]. [facility]: [facility] Discharge Date 12/04/2018 Primary MD [name], [name], MD Admitting Physician Altamese DillingVaibhavkumar Vachhani, MD  Admission Diagnosis  Influenza A [J10.1] COPD exacerbation (HCC) [J44.1] Elevated troponin I level [R79.89] Acute respiratory failure with hypoxia and hypercapnia (HCC) [J96.01, J96.02]  Discharge Diagnosis   Principal Problem:   Acute respiratory failure with hypoxia (HCC)   COPD exacerbation (HCC)   Influenza A Elevated troponin due to demand ischemia Hypotension Acute renal insufficiency Chronic systolic CHF Diabetes type 2 Schizophrenia Nicotine abuse  Hospital Course  Paul ParcelDennis Hernandez  is a 53 y.o. male with a known history of CHF, COPD, diabetes, hypertension, mental health disorder-lives in a group home and has 25 to 30% ejection fraction at baseline.  Patient was brought to the emergency room with worsening shortness of breath he initially required BiPAP.  Now on room air.  He was treated for the flu as well as COPD exacerbation.  Patient is doing much better.  He also was noted to have elevated troponin due to demand ischemia.  Patient had hypotension requiring pressures now his not requiring any pressors.  Patient also had elevated troponin was seen by cardiology.  They felt that this was due to demand ischemia.            Consults  cardiology  Significant Tests:  See full reports for all details     Dg Abd 1 View  Result Date: 11/29/2018 CLINICAL DATA:  Orogastric tube placement. EXAM: ABDOMEN - 1 VIEW COMPARISON:  Abdomen and pelvis CT dated 04/12/2011. FINDINGS: Orogastric tube extending into the stomach with its tip and side hole in the proximal stomach. Gas and stool in normal caliber colon. Clear lung bases. Enlarged heart. Unremarkable bones. IMPRESSION: 1. Orogastric tube tip and side hole in the proximal stomach. 2.  Cardiomegaly. Electronically Signed   By: Beckie SaltsSteven  Reid M.D.   On: 11/29/2018 21:46   Ct Head Wo Contrast  Result Date: 11/30/2018 CLINICAL DATA:  Encephalopathy.  Altered level of consciousness. EXAM: CT HEAD WITHOUT CONTRAST TECHNIQUE: Contiguous axial images were obtained from the base of the skull through the vertex without intravenous contrast. COMPARISON:  MRI 11/16/2017 FINDINGS: Brain: Mild cerebral atrophy. No acute intracranial abnormality. Specifically, no hemorrhage, hydrocephalus, mass lesion, acute infarction, or significant intracranial injury. Vascular: No hyperdense vessel or unexpected calcification. Skull: No acute calvarial abnormality. Sinuses/Orbits: Visualized paranasal sinuses and mastoids clear. Orbital soft tissues unremarkable. Other: None IMPRESSION: Mild cerebral atrophy.  No acute intracranial abnormality. Electronically Signed   By: Charlett NoseKevin  Dover M.D.   On: 11/30/2018 01:41   Dg Chest Port 1 View  Result Date: 12/04/2018 CLINICAL DATA:  Shortness of breath EXAM: PORTABLE CHEST 1 VIEW COMPARISON:  12/01/2018 FINDINGS: The heart is enlarged but stable. Stable tortuosity and calcification of the thoracic aorta. The right PICC line is in good position, unchanged. Mild bronchitic changes and streaky basilar atelectasis but no definite infiltrates, edema or effusions. IMPRESSION: Cardiac enlargement, stable. Streaky basilar atelectasis.  No edema, infiltrates or effusions. Electronically Signed   By: Rudie MeyerP.  Gallerani M.D.   On: 12/04/2018 11:28   Dg Chest Port 1 View  Result Date: 12/01/2018 CLINICAL DATA:  53 year old male recently admitted with acute respiratory failure. EXAM: PORTABLE CHEST 1 VIEW COMPARISON:  To a 20 and earlier. FINDINGS: Portable AP upright view at 1016 hours. Extubated and enteric tube removed. Stable right IJ central line. Stable lung volumes and  mediastinal contours. No pneumothorax, pulmonary edema, pleural effusion or consolidation. Minimal bibasilar  increased opacity appears stable and probably reflects crowding. Visualized tracheal air column is within normal limits. IMPRESSION: 1. Extubated and enteric tube removed. 2. Stable lung volumes.  No acute cardiopulmonary abnormality. Electronically Signed   By: Odessa Fleming M.D.   On: 12/01/2018 13:24   Portable Chest Xray  Result Date: 11/30/2018 CLINICAL DATA:  Respiratory failure EXAM: PORTABLE CHEST 1 VIEW COMPARISON:  11/29/2018 FINDINGS: Endotracheal tube is in place, tip 5.6 centimeters above the carina. A RIGHT IJ central line tip overlies the superior vena cava. A nasogastric tube is in place, tip beyond the gastroesophageal junction and beyond the edge of the image. Heart size is accentuated by technique and probably mildly enlarged. There is subsegmental atelectasis or early infiltrate at the RIGHT lung base. IMPRESSION: 1. Lines and tubes as described. 2. Minimal new RIGHT lower lobe atelectasis or infiltrate. Electronically Signed   By: Norva Pavlov M.D.   On: 11/30/2018 08:14   Dg Chest Port 1 View  Result Date: 11/29/2018 CLINICAL DATA:  Intubated. Central line placement. EXAM: PORTABLE CHEST 1 VIEW COMPARISON:  Earlier today. FINDINGS: The endotracheal tube remains in satisfactory position. Interval right jugular catheter with its tip in the superior vena cava. No pneumothorax. Stable enlarged cardiac silhouette. Clear lungs with normal vascularity. Unremarkable bones. IMPRESSION: 1. Right jugular catheter tip in the superior vena cava without pneumothorax. 2. Stable cardiomegaly. Electronically Signed   By: Beckie Salts M.D.   On: 11/29/2018 21:47   Portable Chest X-ray  Result Date: 11/29/2018 CLINICAL DATA:  Status post intubation. EXAM: PORTABLE CHEST 1 VIEW COMPARISON:  11/29/2018. FINDINGS: Interval endotracheal tube in satisfactory position. Stable mildly enlarged cardiac silhouette. Clear lungs with normal vascularity. Mild peribronchial thickening. Unremarkable bones. IMPRESSION: 1.  Endotracheal tube in satisfactory position. 2. Stable mild cardiomegaly with interval mild bronchitic changes. Electronically Signed   By: Beckie Salts M.D.   On: 11/29/2018 20:17   Dg Chest Port 1 View  Result Date: 11/29/2018 CLINICAL DATA:  Shortness of breath EXAM: PORTABLE CHEST 1 VIEW COMPARISON:  October 25, 2017 FINDINGS: No edema or consolidation. Heart is mildly enlarged with pulmonary vascularity normal. No adenopathy. No bone lesions. IMPRESSION: No edema or consolidation.  Mild cardiac enlargement. Electronically Signed   By: Bretta Bang III M.D.   On: 11/29/2018 07:51       Today   Subjective:   Paul Hernandez patient doing better confused at baseline  Objective:   Blood pressure (!) 150/100, pulse 90, temperature 98.4 F (36.9 C), temperature source Oral, resp. rate (!) 22, height 6' (1.829 m), weight 99.8 kg, SpO2 99 %.  .  Intake/Output Summary (Last 24 hours) at 12/04/2018 1510 Last data filed at 12/04/2018 1400 Gross per 24 hour  Intake 480 ml  Output 1001 ml  Net -521 ml    Exam VITAL SIGNS: Blood pressure (!) 150/100, pulse 90, temperature 98.4 F (36.9 C), temperature source Oral, resp. rate (!) 22, height 6' (1.829 m), weight 99.8 kg, SpO2 99 %.  GENERAL:  53 y.o.-year-old patient lying in the bed with no acute distress.  EYES: Pupils equal, round, reactive to light and accommodation. No scleral icterus. Extraocular muscles intact.  HEENT: Head atraumatic, normocephalic. Oropharynx and nasopharynx clear.  NECK:  Supple, no jugular venous distention. No thyroid enlargement, no tenderness.  LUNGS: Normal breath sounds bilaterally, no wheezing, rales,rhonchi or crepitation. No use of accessory muscles of respiration.  CARDIOVASCULAR:  S1, S2 normal. No murmurs, rubs, or gallops.  ABDOMEN: Soft, nontender, nondistended. Bowel sounds present. No organomegaly or mass.  EXTREMITIES: No pedal edema, cyanosis, or clubbing.  NEUROLOGIC: Confused PSYCHIATRIC: The  patient is alert and oriented x 3.  SKIN: No obvious rash, lesion, or ulcer.   Data Review     CBC w Diff:  Lab Results  Component Value Date   WBC 13.9 (H) 12/03/2018   HGB 12.6 (L) 12/03/2018   HCT 39.5 12/03/2018   PLT 217 12/03/2018   LYMPHOPCT 2 11/29/2018   MONOPCT 5 11/29/2018   EOSPCT 0 11/29/2018   BASOPCT 0 11/29/2018   CMP:  Lab Results  Component Value Date   NA 137 12/02/2018   K 4.3 12/02/2018   CL 102 12/02/2018   CO2 30 12/02/2018   BUN 45 (H) 12/02/2018   CREATININE 1.33 (H) 12/02/2018   PROT 5.4 (L) 12/03/2018   ALBUMIN 3.0 (L) 12/03/2018   BILITOT 0.6 12/03/2018   ALKPHOS 46 12/03/2018   AST 49 (H) 12/03/2018   ALT 203 (H) 12/03/2018  .  Micro Results Recent Results (from the past 240 hour(s))  MRSA PCR Screening     Status: None   Collection Time: 11/29/18  5:33 PM  Result Value Ref Range Status   MRSA by PCR NEGATIVE NEGATIVE Final    Comment:        The GeneXpert MRSA Assay (FDA approved for NASAL specimens only), is one component of a comprehensive MRSA colonization surveillance program. It is not intended to diagnose MRSA infection nor to guide or monitor treatment for MRSA infections. Performed at [facility], [address] [place], [place] [address]         Code Status Orders  (From admission, onward)         Start     Ordered   11/29/18 1256  Full code  Continuous     11/29/18 1255        Code Status History    Date Active Date Inactive Code Status Order ID Comments User Context   11/15/2017 2242 11/16/2017 2016 Full Code [id number]  Oralia ManisWillis, David, MD Inpatient   09/16/2017 1534 09/18/2017 1440 Full Code [id number]  Alford HighlandWieting, Richard, MD ED           Contact information for follow-up providers    [name], [name], MD Follow up in 6 day(s).   Specialty:  Internal Medicine Contact information: [address] [facility] [place] [address] [phone]            Contact information for  after-discharge care    Destination    [facility] .   Service:  Group Home Contact information: [address] [place] [address] [phone]                  Discharge Medications   Allergies as of 12/04/2018      Reactions   Asa [aspirin]       Medication List    STOP taking these medications   metFORMIN 500 MG tablet Commonly known as:  GLUCOPHAGE   triamterene-hydrochlorothiazide 75-50 MG tablet Commonly known as:  MAXZIDE     TAKE these medications   albuterol (2.5 MG/3ML) 0.083% nebulizer solution Commonly known as:  PROVENTIL Take 2.5 mg by nebulization 4 (four) times daily as needed for wheezing or shortness of breath. What changed:  Another medication with the same name was changed. Make sure you understand how and when  to take each.   albuterol 108 (90 Base) MCG/ACT inhaler Commonly known as:  PROVENTIL HFA;VENTOLIN HFA Inhale 2 puffs into the lungs every 6 (six) hours as needed for wheezing or shortness of breath. What changed:    how much to take  when to take this   amLODipine 5 MG tablet Commonly known as:  NORVASC Take 10 mg by mouth daily.   ANORO ELLIPTA 62.5-25 MCG/INH Aepb Generic drug:  umeclidinium-vilanterol Inhale 1 puff into the lungs daily.   aspirin EC 325 MG tablet Take 1 tablet (325 mg total) by mouth daily.   benazepril 20 MG tablet Commonly known as:  LOTENSIN Take 1 tablet (20 mg total) by mouth daily.   benztropine 2 MG tablet Commonly known as:  COGENTIN Take 2 mg by mouth at bedtime.   carvedilol 25 MG tablet Commonly known as:  COREG Take 1 tablet (25 mg total) by mouth 2 (two) times daily with a meal.   dextrose 40 % Gel Commonly known as:  GLUTOSE Take by mouth once as needed for low blood sugar. For blood sugar <70   diphenhydrAMINE 25 mg capsule Commonly known as:  BENADRYL Take 25 mg by mouth at bedtime.   EDARBYCLOR 40-12.5 MG Tabs Generic drug:   Azilsartan-Chlorthalidone Take 1 tablet by mouth daily.   ezetimibe 10 MG tablet Commonly known as:  ZETIA Take 10 mg by mouth daily.   FARXIGA 5 MG Tabs tablet Generic drug:  dapagliflozin propanediol Take 5 mg by mouth daily.   fluticasone 50 MCG/ACT nasal spray Commonly known as:  FLONASE Place 1 spray into both nostrils daily.   guaifenesin 100 MG/5ML syrup Commonly known as:  ROBITUSSIN Take 200 mg by mouth every 4 (four) hours as needed for cough.   haloperidol decanoate 100 MG/ML injection Commonly known as:  HALDOL DECANOATE Inject 90 mg into the muscle every 28 (twenty-eight) days.   isosorbide-hydrALAZINE 20-37.5 MG tablet Commonly known as:  BIDIL Take 1 tablet by mouth 3 (three) times daily.   rosuvastatin 40 MG tablet Commonly known as:  CRESTOR Take 40 mg by mouth daily.   Vitamin D3 125 MCG (5000 UT) Caps Take 5,000 Units by mouth daily.            Durable Medical Equipment  (From admission, onward)         Start     Ordered   12/04/18 1207  For home use only DME Walker rolling  Once    Question:  Patient needs a walker to treat with the following condition  Answer:  Gait abnormality   12/04/18 1206             Total Time in preparing paper work, data evaluation and todays exam - 35 minutes  Auburn Bilberry M.D on 12/04/2018 at 3:10 PM [facility]   Office  [phone]

## 2018-12-04 NOTE — Progress Notes (Signed)
Patient asked to call Liborio Nixon again for estimated time of arrival.

## 2018-12-12 ENCOUNTER — Encounter: Payer: Self-pay | Admitting: Emergency Medicine

## 2018-12-12 ENCOUNTER — Emergency Department: Payer: Medicare Other

## 2018-12-12 ENCOUNTER — Inpatient Hospital Stay
Admission: EM | Admit: 2018-12-12 | Discharge: 2018-12-13 | DRG: 192 | Disposition: A | Discharge status: Home / Self Care | Payer: Medicare Other | Attending: Internal Medicine | Admitting: Internal Medicine

## 2018-12-12 ENCOUNTER — Other Ambulatory Visit: Payer: Self-pay

## 2018-12-12 DIAGNOSIS — F17210 Nicotine dependence, cigarettes, uncomplicated: Secondary | ICD-10-CM | POA: Diagnosis present

## 2018-12-12 DIAGNOSIS — I509 Heart failure, unspecified: Secondary | ICD-10-CM | POA: Diagnosis not present

## 2018-12-12 DIAGNOSIS — Z886 Allergy status to analgesic agent status: Secondary | ICD-10-CM | POA: Diagnosis not present

## 2018-12-12 DIAGNOSIS — R0902 Hypoxemia: Secondary | ICD-10-CM | POA: Diagnosis present

## 2018-12-12 DIAGNOSIS — I110 Hypertensive heart disease with heart failure: Secondary | ICD-10-CM | POA: Diagnosis present

## 2018-12-12 DIAGNOSIS — E119 Type 2 diabetes mellitus without complications: Secondary | ICD-10-CM | POA: Diagnosis present

## 2018-12-12 DIAGNOSIS — Z7982 Long term (current) use of aspirin: Secondary | ICD-10-CM

## 2018-12-12 DIAGNOSIS — Z8249 Family history of ischemic heart disease and other diseases of the circulatory system: Secondary | ICD-10-CM | POA: Diagnosis not present

## 2018-12-12 DIAGNOSIS — F209 Schizophrenia, unspecified: Secondary | ICD-10-CM | POA: Diagnosis present

## 2018-12-12 DIAGNOSIS — Z79899 Other long term (current) drug therapy: Secondary | ICD-10-CM | POA: Diagnosis not present

## 2018-12-12 DIAGNOSIS — J441 Chronic obstructive pulmonary disease with (acute) exacerbation: Secondary | ICD-10-CM | POA: Diagnosis not present

## 2018-12-12 DIAGNOSIS — R0602 Shortness of breath: Secondary | ICD-10-CM | POA: Diagnosis present

## 2018-12-12 DIAGNOSIS — Z7951 Long term (current) use of inhaled steroids: Secondary | ICD-10-CM | POA: Diagnosis not present

## 2018-12-12 LAB — COMPREHENSIVE METABOLIC PANEL
ALK PHOS: 59 U/L (ref 38–126)
ALT: 41 U/L (ref 0–44)
AST: 23 U/L (ref 15–41)
Albumin: 3.7 g/dL (ref 3.5–5.0)
Anion gap: 9 (ref 5–15)
BUN: 11 mg/dL (ref 6–20)
CO2: 30 mmol/L (ref 22–32)
CREATININE: 0.94 mg/dL (ref 0.61–1.24)
Calcium: 8.7 mg/dL — ABNORMAL LOW (ref 8.9–10.3)
Chloride: 100 mmol/L (ref 98–111)
GFR calc Af Amer: >60 mL/min (ref >60)
Glucose, Bld: 213 mg/dL — ABNORMAL HIGH (ref 70–99)
Potassium: 3.8 mmol/L (ref 3.5–5.1)
Sodium: 139 mmol/L (ref 135–145)
Total Bilirubin: 0.8 mg/dL (ref 0.3–1.2)
Total Protein: 7.1 g/dL (ref 6.5–8.1)

## 2018-12-12 LAB — CBC WITH DIFFERENTIAL/PLATELET
Abs Immature Granulocytes: 0.06 10*3/uL (ref 0.00–0.07)
Basophils Absolute: 0 10*3/uL (ref 0.0–0.1)
Basophils Relative: 1 %
Eosinophils Absolute: 0.1 10*3/uL (ref 0.0–0.5)
Eosinophils Relative: 1 %
HCT: 49.7 % (ref 39.0–52.0)
HEMOGLOBIN: 15.8 g/dL (ref 13.0–17.0)
Immature Granulocytes: 1 %
Lymphocytes Relative: 8 %
Lymphs Abs: 0.7 10*3/uL (ref 0.7–4.0)
MCH: 28.7 pg (ref 26.0–34.0)
MCHC: 31.8 g/dL (ref 30.0–36.0)
MCV: 90.2 fL (ref 80.0–100.0)
Monocytes Absolute: 0.6 10*3/uL (ref 0.1–1.0)
Monocytes Relative: 7 %
Neutro Abs: 7.4 10*3/uL (ref 1.7–7.7)
Neutrophils Relative %: 82 %
Platelets: 322 10*3/uL (ref 150–400)
RBC: 5.51 MIL/uL (ref 4.22–5.81)
RDW: 13.6 % (ref 11.5–15.5)
WBC: 8.9 10*3/uL (ref 4.0–10.5)
nRBC: 0 % (ref 0.0–0.2)

## 2018-12-12 LAB — INFLUENZA PANEL BY PCR (TYPE A & B)
INFLBPCR: NEGATIVE
Influenza A By PCR: NEGATIVE

## 2018-12-12 LAB — GLUCOSE, CAPILLARY
GLUCOSE-CAPILLARY: 334 mg/dL — AB (ref 70–99)
GLUCOSE-CAPILLARY: 349 mg/dL — AB (ref 70–99)
Glucose-Capillary: 299 mg/dL — ABNORMAL HIGH (ref 70–99)

## 2018-12-12 LAB — BRAIN NATRIURETIC PEPTIDE: B Natriuretic Peptide: 784 pg/mL — ABNORMAL HIGH (ref 0.0–100.0)

## 2018-12-12 LAB — TROPONIN I
Troponin I: 0.05 ng/mL (ref <0.03)
Troponin I: 0.03 ng/mL (ref <0.03)

## 2018-12-12 MED ORDER — ACETAMINOPHEN 325 MG PO TABS: 650.0000 mg | ORAL_TABLET | Freq: Four times a day (QID) | ORAL | Status: DC | PRN

## 2018-12-12 MED ORDER — SENNOSIDES-DOCUSATE SODIUM 8.6-50 MG PO TABS: 1.0000 | ORAL_TABLET | Freq: Every evening | ORAL | Status: DC | PRN

## 2018-12-12 MED ORDER — IPRATROPIUM-ALBUTEROL 0.5-2.5 (3) MG/3ML IN SOLN
3.0000 mL | Freq: Once | RESPIRATORY_TRACT | Status: AC
  Administered 2018-12-12: 3 mL via RESPIRATORY_TRACT
  Filled 2018-12-12: qty 3

## 2018-12-12 MED ORDER — METHYLPREDNISOLONE SODIUM SUCC 125 MG IJ SOLR
60.0000 mg | Freq: Once | INTRAMUSCULAR | Status: AC
  Administered 2018-12-12: 60 mg via INTRAVENOUS
  Filled 2018-12-12: qty 2

## 2018-12-12 MED ORDER — NICOTINE 21 MG/24HR TD PT24
21.0000 mg | MEDICATED_PATCH | Freq: Every day | TRANSDERMAL | Status: DC
  Filled 2018-12-12 (×2): qty 1

## 2018-12-12 MED ORDER — AZILSARTAN-CHLORTHALIDONE 40-12.5 MG PO TABS: 1.0000 | ORAL_TABLET | Freq: Every day | ORAL | Status: DC

## 2018-12-12 MED ORDER — INSULIN ASPART 100 UNIT/ML ~~LOC~~ SOLN: 12.0000 [IU] | Freq: Once | SUBCUTANEOUS | Status: DC

## 2018-12-12 MED ORDER — FLUTICASONE PROPIONATE 50 MCG/ACT NA SUSP
1.0000 | Freq: Every day | NASAL | Status: DC | PRN
  Filled 2018-12-12: qty 16

## 2018-12-12 MED ORDER — ONDANSETRON HCL 4 MG/2ML IJ SOLN: 4.0000 mg | Freq: Four times a day (QID) | INTRAMUSCULAR | Status: DC | PRN

## 2018-12-12 MED ORDER — GUAIFENESIN 100 MG/5ML PO SOLN: 200.0000 mg | ORAL | Status: DC | PRN

## 2018-12-12 MED ORDER — BENAZEPRIL HCL 20 MG PO TABS: 20.0000 mg | ORAL_TABLET | Freq: Every day | ORAL | Status: DC

## 2018-12-12 MED ORDER — INSULIN ASPART 100 UNIT/ML ~~LOC~~ SOLN
0.0000 [IU] | Freq: Every day | SUBCUTANEOUS | Status: DC
  Administered 2018-12-12: 22:00:00 4 [IU] via SUBCUTANEOUS
  Filled 2018-12-12: qty 1

## 2018-12-12 MED ORDER — ASPIRIN EC 325 MG PO TBEC
325.0000 mg | DELAYED_RELEASE_TABLET | Freq: Every day | ORAL | Status: DC
  Administered 2018-12-13: 08:00:00 325 mg via ORAL
  Filled 2018-12-12: qty 1

## 2018-12-12 MED ORDER — METHYLPREDNISOLONE SODIUM SUCC 125 MG IJ SOLR
60.0000 mg | Freq: Four times a day (QID) | INTRAMUSCULAR | Status: DC
  Administered 2018-12-12 – 2018-12-13 (×3): 60 mg via INTRAVENOUS
  Filled 2018-12-12 (×3): qty 2

## 2018-12-12 MED ORDER — DIPHENHYDRAMINE HCL 25 MG PO CAPS
25.0000 mg | ORAL_CAPSULE | Freq: Every day | ORAL | Status: DC
  Administered 2018-12-12: 25 mg via ORAL
  Filled 2018-12-12: qty 1

## 2018-12-12 MED ORDER — BENZTROPINE MESYLATE 2 MG PO TABS
2.0000 mg | ORAL_TABLET | Freq: Every day | ORAL | Status: DC
  Administered 2018-12-12: 2 mg via ORAL
  Filled 2018-12-12 (×2): qty 1

## 2018-12-12 MED ORDER — IPRATROPIUM-ALBUTEROL 0.5-2.5 (3) MG/3ML IN SOLN
3.0000 mL | Freq: Four times a day (QID) | RESPIRATORY_TRACT | Status: DC
  Administered 2018-12-12 – 2018-12-13 (×3): 3 mL via RESPIRATORY_TRACT
  Filled 2018-12-12 (×5): qty 3

## 2018-12-12 MED ORDER — EZETIMIBE 10 MG PO TABS
10.0000 mg | ORAL_TABLET | Freq: Every day | ORAL | Status: DC
  Administered 2018-12-13: 08:00:00 10 mg via ORAL
  Filled 2018-12-12: qty 1

## 2018-12-12 MED ORDER — SODIUM CHLORIDE 0.9% FLUSH: 3.0000 mL | INTRAVENOUS | Status: DC | PRN

## 2018-12-12 MED ORDER — AMLODIPINE BESYLATE 10 MG PO TABS
10.0000 mg | ORAL_TABLET | Freq: Every day | ORAL | Status: DC
  Administered 2018-12-13: 10 mg via ORAL
  Filled 2018-12-12: qty 1

## 2018-12-12 MED ORDER — ISOSORB DINITRATE-HYDRALAZINE 20-37.5 MG PO TABS
1.0000 | ORAL_TABLET | Freq: Three times a day (TID) | ORAL | Status: DC
  Administered 2018-12-12 – 2018-12-13 (×2): 1 via ORAL
  Filled 2018-12-12 (×4): qty 1

## 2018-12-12 MED ORDER — ROSUVASTATIN CALCIUM 20 MG PO TABS
40.0000 mg | ORAL_TABLET | Freq: Every day | ORAL | Status: DC
  Administered 2018-12-13: 40 mg via ORAL
  Filled 2018-12-12: qty 2

## 2018-12-12 MED ORDER — CARVEDILOL 25 MG PO TABS
25.0000 mg | ORAL_TABLET | Freq: Two times a day (BID) | ORAL | Status: DC
  Administered 2018-12-13: 25 mg via ORAL
  Filled 2018-12-12: qty 1

## 2018-12-12 MED ORDER — CHLORTHALIDONE 25 MG PO TABS
12.5000 mg | ORAL_TABLET | Freq: Every day | ORAL | Status: DC
  Administered 2018-12-13: 12.5 mg via ORAL
  Filled 2018-12-12: qty 0.5

## 2018-12-12 MED ORDER — INSULIN ASPART 100 UNIT/ML ~~LOC~~ SOLN
0.0000 [IU] | Freq: Three times a day (TID) | SUBCUTANEOUS | Status: DC
  Administered 2018-12-12 – 2018-12-13 (×2): 8 [IU] via SUBCUTANEOUS
  Administered 2018-12-13: 09:00:00 11 [IU] via SUBCUTANEOUS
  Filled 2018-12-12 (×3): qty 1

## 2018-12-12 MED ORDER — ONDANSETRON HCL 4 MG PO TABS: 4.0000 mg | ORAL_TABLET | Freq: Four times a day (QID) | ORAL | Status: DC | PRN

## 2018-12-12 MED ORDER — SODIUM CHLORIDE 0.9% FLUSH
3.0000 mL | Freq: Two times a day (BID) | INTRAVENOUS | Status: DC
  Administered 2018-12-13: 3 mL via INTRAVENOUS

## 2018-12-12 MED ORDER — SODIUM CHLORIDE 0.9 % IV SOLN: 250.0000 mL | INTRAVENOUS | Status: DC | PRN

## 2018-12-12 MED ORDER — ENOXAPARIN SODIUM 40 MG/0.4ML ~~LOC~~ SOLN
40.0000 mg | SUBCUTANEOUS | Status: DC
  Filled 2018-12-12: qty 0.4

## 2018-12-12 MED ORDER — ALBUTEROL SULFATE (2.5 MG/3ML) 0.083% IN NEBU
5.0000 mg | INHALATION_SOLUTION | Freq: Once | RESPIRATORY_TRACT | Status: AC
  Administered 2018-12-12: 5 mg via RESPIRATORY_TRACT

## 2018-12-12 MED ORDER — VITAMIN D 25 MCG (1000 UNIT) PO TABS
5000.0000 [IU] | ORAL_TABLET | Freq: Every day | ORAL | Status: DC
  Administered 2018-12-13: 5000 [IU] via ORAL
  Filled 2018-12-12: qty 5

## 2018-12-12 MED ORDER — IRBESARTAN 150 MG PO TABS
300.0000 mg | ORAL_TABLET | Freq: Every day | ORAL | Status: DC
  Administered 2018-12-13: 08:00:00 300 mg via ORAL
  Filled 2018-12-12: qty 2

## 2018-12-12 MED ORDER — ACETAMINOPHEN 650 MG RE SUPP: 650.0000 mg | Freq: Four times a day (QID) | RECTAL | Status: DC | PRN

## 2018-12-12 NOTE — Progress Notes (Signed)
MD called and notified of CBG of 334 post 8 units novolog. Per MD give 12 additional units of novolog once now. Order placed. Bo Mcclintock, RN

## 2018-12-12 NOTE — ED Notes (Signed)
2 unsuccessful attempts by this RN for IV access

## 2018-12-12 NOTE — ED Notes (Addendum)
Pt requesting food/drink. Food tray and drink given to pt.

## 2018-12-12 NOTE — ED Notes (Signed)
ED TO INPATIENT HANDOFF REPORT  ED Nurse Name and Phone #: Greta Doom [phone]  S Name/Age/Gender Paul Hernandez 53 y.o. male Room/Bed: ED03A/ED03A  Code Status   Code Status: Prior  Home/SNF/Other Group Home  Patient oriented to: self, place, time and situation Is this baseline? Yes   Triage Complete: Triage complete  Chief Complaint flu  Triage Note Pt arrives with a complaints of shortness of breath. PT denies pain. PT a poor historian. Wheezing heard on auscultation. Labored breathing in triage.   Allergies Allergies  Allergen Reactions  . Asa [Aspirin]     Level of Care/Admitting Diagnosis ED Disposition    ED Disposition Condition Comment   Admit  Hospital Area: [facility] [[id number]]  Level of Care: Med-Surg [16]  Diagnosis: COPD with acute exacerbation [facility]) [[id number]]  Admitting Physician: Ihor Austin [[id number]]  Attending Physician: Ihor Austin [[id number]]  Estimated length of stay: past midnight tomorrow  Certification:: I certify this patient will need inpatient services for at least 2 midnights  PT Class (Do Not Modify): Inpatient [101]  PT Acc Code (Do Not Modify): Private [1]       B Medical/Surgery History Past Medical History:  Diagnosis Date  . CHF (congestive heart failure) (HCC)   . COPD (chronic obstructive pulmonary disease) (HCC)   . Diabetes mellitus without complication (HCC)   . Hypertension   . Mental health disorder    Past Surgical History:  Procedure Laterality Date  . NO PAST SURGERIES       A IV Location/Drains/Wounds Patient Lines/Drains/Airways Status   Active Line/Drains/Airways    Name:   Placement date:   Placement time:   Site:   Days:   Peripheral IV 12/12/18 Left Forearm   12/12/18    1327    Forearm   less than 1          Intake/Output Last 24 hours No intake or output data in the 24 hours ending 12/12/18 1650  Labs/Imaging Results for orders placed or performed during the hospital  encounter of 12/12/18 (from the past 48 hour(s))  CBC with Differential     Status: None   Collection Time: 12/12/18  1:30 PM  Result Value Ref Range   WBC 8.9 4.0 - 10.5 K/uL   RBC 5.51 4.22 - 5.81 MIL/uL   Hemoglobin 15.8 13.0 - 17.0 g/dL   HCT 68.3 41.9 - 62.2 %   MCV 90.2 80.0 - 100.0 fL   MCH 28.7 26.0 - 34.0 pg   MCHC 31.8 30.0 - 36.0 g/dL   RDW 29.7 98.9 - 21.1 %   Platelets 322 150 - 400 K/uL   nRBC 0.0 0.0 - 0.2 %   Neutrophils Relative % 82 %   Neutro Abs 7.4 1.7 - 7.7 K/uL   Lymphocytes Relative 8 %   Lymphs Abs 0.7 0.7 - 4.0 K/uL   Monocytes Relative 7 %   Monocytes Absolute 0.6 0.1 - 1.0 K/uL   Eosinophils Relative 1 %   Eosinophils Absolute 0.1 0.0 - 0.5 K/uL   Basophils Relative 1 %   Basophils Absolute 0.0 0.0 - 0.1 K/uL   Immature Granulocytes 1 %   Abs Immature Granulocytes 0.06 0.00 - 0.07 K/uL    Comment: Performed at [facility], [address] [place], [place] [address]  Comprehensive metabolic panel     Status: Abnormal   Collection Time: 12/12/18  1:30 PM  Result Value Ref Range   Sodium 139 135 - 145 mmol/L  Potassium 3.8 3.5 - 5.1 mmol/L   Chloride 100 98 - 111 mmol/L   CO2 30 22 - 32 mmol/L   Glucose, Bld 213 (H) 70 - 99 mg/dL   BUN 11 6 - 20 mg/dL   Creatinine, Ser 4.540.94 0.61 - 1.24 mg/dL   Calcium 8.7 (L) 8.9 - 10.3 mg/dL   Total Protein 7.1 6.5 - 8.1 g/dL   Albumin 3.7 3.5 - 5.0 g/dL   AST 23 15 - 41 U/L   ALT 41 0 - 44 U/L   Alkaline Phosphatase 59 38 - 126 U/L   Total Bilirubin 0.8 0.3 - 1.2 mg/dL   GFR calc non Af Amer >60 >60 mL/min   GFR calc Af Amer >60 >60 mL/min   Anion gap 9 5 - 15    Comment: Performed at [facility], [address] [place], [place] [address]  Brain natriuretic peptide     Status: Abnormal   Collection Time: 12/12/18  1:30 PM  Result Value Ref Range   B Natriuretic Peptide 784.0 (H) 0.0 - 100.0 pg/mL    Comment: Performed at [facility], [address] [place], [place]  [address]  Troponin I - ONCE - STAT     Status: Abnormal   Collection Time: 12/12/18  1:30 PM  Result Value Ref Range   Troponin I 0.05 (HH) <0.03 ng/mL    Comment: CRITICAL RESULT CALLED TO, READ BACK BY AND VERIFIED WITH GEORGIE Donise Woodle 12/12/18 @ 1436  MLK Performed at [name], [address] [place], [place] [address]   Influenza panel by PCR (type A & B)     Status: None   Collection Time: 12/12/18  1:30 PM  Result Value Ref Range   Influenza A By PCR NEGATIVE NEGATIVE   Influenza B By PCR NEGATIVE NEGATIVE    Comment: (NOTE) The Xpert Xpress Flu assay is intended as an aid in the diagnosis of  influenza and should not be used as a sole basis for treatment.  This  assay is FDA approved for nasopharyngeal swab specimens only. Nasal  washings and aspirates are unacceptable for Xpert Xpress Flu testing. Performed at [facility], [address] [place], [place] [address]    Dg Chest Portable 1 View  Result Date: 12/12/2018 CLINICAL DATA:  Shortness of breath today.  History of COPD. EXAM: PORTABLE CHEST 1 VIEW COMPARISON:  Single-view of the chest 12/04/2018. PA and lateral chest 10/25/2017. FINDINGS: The lungs are clear. Heart size is upper normal. No pneumothorax or pleural effusion. No acute or focal bony abnormality. IMPRESSION: No acute disease. Electronically Signed   By: Drusilla Kannerhomas  Dalessio M.D.   On: 12/12/2018 13:44    Pending Labs Unresulted Labs (From admission, onward)    Start     Ordered   12/12/18 1740  Troponin I - Once-Timed  Once-Timed,   STAT     12/12/18 1523   Signed and Held  CBC  (enoxaparin (LOVENOX)    CrCl >/= 30 ml/min)  Once,   R    Comments:  Baseline for enoxaparin therapy IF NOT ALREADY DRAWN.  Notify MD if PLT < 100 K.    Signed and Held   Signed and Held  Creatinine, serum  (enoxaparin (LOVENOX)    CrCl >/= 30 ml/min)  Once,   R    Comments:  Baseline for enoxaparin therapy IF NOT ALREADY DRAWN.    Signed and Held   Signed  and Held  Creatinine,  serum  (enoxaparin (LOVENOX)    CrCl >/= 30 ml/min)  Weekly,   R    Comments:  while on enoxaparin therapy    Signed and Held   Signed and Held  Basic metabolic panel  Tomorrow morning,   R     Signed and Held   Signed and Held  CBC  Tomorrow morning,   R     Signed and Held          Vitals/Pain Today's Vitals   12/12/18 1500 12/12/18 1530 12/12/18 1532 12/12/18 1536  BP: (!) 137/97 (!) 121/94    Pulse: 86  87 84  Resp:      Temp:      TempSrc:      SpO2: 100%  93% 95%  Weight:      Height:      PainSc:        Isolation Precautions No active isolations  Medications Medications  ipratropium-albuterol (DUONEB) 0.5-2.5 (3) MG/3ML nebulizer solution 3 mL (0 mLs Nebulization Hold 12/12/18 1528)  methylPREDNISolone sodium succinate (SOLU-MEDROL) 125 mg/2 mL injection 60 mg (has no administration in time range)  insulin aspart (novoLOG) injection 0-15 Units (has no administration in time range)  insulin aspart (novoLOG) injection 0-5 Units (has no administration in time range)  nicotine (NICODERM CQ - dosed in mg/24 hours) patch 21 mg (has no administration in time range)  albuterol (PROVENTIL) (2.5 MG/3ML) 0.083% nebulizer solution 5 mg (5 mg Nebulization Given 12/12/18 1253)  methylPREDNISolone sodium succinate (SOLU-MEDROL) 125 mg/2 mL injection 60 mg (60 mg Intravenous Given 12/12/18 1444)  ipratropium-albuterol (DUONEB) 0.5-2.5 (3) MG/3ML nebulizer solution 3 mL (3 mLs Nebulization Given 12/12/18 1445)  ipratropium-albuterol (DUONEB) 0.5-2.5 (3) MG/3ML nebulizer solution 3 mL (3 mLs Nebulization Given 12/12/18 1445)    Mobility walks with device Moderate fall risk   Focused Assessments Pulmonary Assessment Handoff:  Lung sounds: Bilateral Breath Sounds: Diminished, Expiratory wheezes(wheezing is not as bad as on 1st resp asmt) O2 Device: Nasal Cannula O2 Flow Rate (L/min): 2 L/min      R Recommendations: See Admitting Provider Note  Report  given to:   Additional Notes:  Pt slow to respond sometimes but A&Ox4. L fa 22g IV. Hard stick. Inc Trop and BNP.

## 2018-12-12 NOTE — H&P (Signed)
[facility] - [place] at [facility] [place]   PATIENT NAME: Paul Hernandez    MR#:  [id number]  DATE OF BIRTH:  [birth year]  DATE OF ADMISSION:  12/12/2018  PRIMARY CARE PHYSICIAN: Rexene Agent, MD   REQUESTING/REFERRING PHYSICIAN:   CHIEF COMPLAINT:   Chief Complaint  Patient presents with  . Shortness of Breath    HISTORY OF PRESENT ILLNESS: Paul Hernandez  is a 53 y.o. male with a known history of congestive heart failure COPD, type 2 diabetes mellitus, hypertension, schizophrenia presented to the emergency room for shortness of breath and wheezing.  Been going on for the last 2 days.  Patient was put on oxygen via nasal cannula currently and given nebulization therapy in the emergency room.  He was also received IV Solu-Medrol.  Flu test has been negative.  Has a dry cough.  No complaints of fever.  Chest x-ray revealed no acute pneumonia.  PAST MEDICAL HISTORY:   Past Medical History:  Diagnosis Date  . CHF (congestive heart failure) (HCC)   . COPD (chronic obstructive pulmonary disease) (HCC)   . Diabetes mellitus without complication (HCC)   . Hypertension   . Mental health disorder     PAST SURGICAL HISTORY:  Past Surgical History:  Procedure Laterality Date  . NO PAST SURGERIES      SOCIAL HISTORY:  Social History   Tobacco Use  . Smoking status: Current Some Day Smoker    Packs/day: 0.50    Types: Cigarettes  . Smokeless tobacco: Never Used  Substance Use Topics  . Alcohol use: No    FAMILY HISTORY:  Family History  Problem Relation Age of Onset  . CVA Mother   . CAD Father     DRUG ALLERGIES:  Allergies  Allergen Reactions  . Asa [Aspirin]     REVIEW OF SYSTEMS:   CONSTITUTIONAL: No fever, fatigue or weakness.  EYES: No blurred or double vision.  EARS, NOSE, AND THROAT: No tinnitus or ear pain.  RESPIRATORY: dry cough, shortness of breath, wheezing  no hemoptysis.  CARDIOVASCULAR: No chest pain, orthopnea, edema.   GASTROINTESTINAL: No nausea, vomiting, diarrhea or abdominal pain.  GENITOURINARY: No dysuria, hematuria.  ENDOCRINE: No polyuria, nocturia,  HEMATOLOGY: No anemia, easy bruising or bleeding SKIN: No rash or lesion. MUSCULOSKELETAL: No joint pain or arthritis.   NEUROLOGIC: No tingling, numbness, weakness.  PSYCHIATRY: No anxiety or depression.   MEDICATIONS AT HOME:  Prior to Admission medications   Medication Sig Start Date End Date Taking? Authorizing Provider  albuterol (PROVENTIL HFA;VENTOLIN HFA) 108 (90 Base) MCG/ACT inhaler Inhale 2 puffs into the lungs every 6 (six) hours as needed for wheezing or shortness of breath. Patient taking differently: Inhale 1-2 puffs into the lungs every 4 (four) hours as needed for wheezing or shortness of breath.  08/13/17   Tommi Rumps, PA-C  albuterol (PROVENTIL) (2.5 MG/3ML) 0.083% nebulizer solution Take 2.5 mg by nebulization 4 (four) times daily as needed for wheezing or shortness of breath.     [provider]  amLODipine (NORVASC) 5 MG tablet Take 10 mg by mouth daily.    [provider]  aspirin EC 325 MG tablet Take 1 tablet (325 mg total) by mouth daily. 11/16/17   Enedina Finner, MD  Azilsartan-Chlorthalidone (EDARBYCLOR) 40-12.5 MG TABS Take 1 tablet by mouth daily.    [provider]  benazepril (LOTENSIN) 20 MG tablet Take 1 tablet (20 mg total) by mouth daily. 11/16/17   Enedina Finner, MD  benztropine (COGENTIN) 2 MG tablet Take 2 mg by mouth at bedtime.    [provider]  carvedilol (COREG) 25 MG tablet Take 1 tablet (25 mg total) by mouth 2 (two) times daily with a meal. 11/16/17   Enedina Finner, MD  Cholecalciferol (VITAMIN D3) 5000 units CAPS Take 5,000 Units by mouth daily.    [provider]  dapagliflozin propanediol (FARXIGA) 5 MG TABS tablet Take 5 mg by mouth daily.    [provider]  dextrose (GLUTOSE) 40 % GEL Take by mouth once as needed for low blood sugar. For blood  sugar <70    [provider]  diphenhydrAMINE (BENADRYL) 25 mg capsule Take 25 mg by mouth at bedtime.     [provider]  ezetimibe (ZETIA) 10 MG tablet Take 10 mg by mouth daily.    [provider]  fluticasone (FLONASE) 50 MCG/ACT nasal spray Place 1 spray into both nostrils daily.    [provider]  guaifenesin (ROBITUSSIN) 100 MG/5ML syrup Take 200 mg by mouth every 4 (four) hours as needed for cough.    [provider]  haloperidol decanoate (HALDOL DECANOATE) 100 MG/ML injection Inject 90 mg into the muscle every 28 (twenty-eight) days.     [provider]  isosorbide-hydrALAZINE (BIDIL) 20-37.5 MG tablet Take 1 tablet by mouth 3 (three) times daily.    [provider]  rosuvastatin (CRESTOR) 40 MG tablet Take 40 mg by mouth daily.    [provider]  umeclidinium-vilanterol (ANORO ELLIPTA) 62.5-25 MCG/INH AEPB Inhale 1 puff into the lungs daily.    [provider]      PHYSICAL EXAMINATION:   VITAL SIGNS: Blood pressure (!) 137/97, pulse 86, temperature 98.2 F (36.8 C), temperature source Oral, resp. rate (!) 26, height 6' (1.829 m), weight 99.8 kg, SpO2 100 %.  GENERAL:  53 y.o.-year-old patient lying in the bed with no acute distress.  EYES: Pupils equal, round, reactive to light and accommodation. No scleral icterus. Extraocular muscles intact.  HEENT: Head atraumatic, normocephalic. Oropharynx and nasopharynx clear.  NECK:  Supple, no jugular venous distention. No thyroid enlargement, no tenderness.  LUNGS: Decreased breath sounds bilaterally, bilateral wheezing, . No use of accessory muscles of respiration.  CARDIOVASCULAR: S1, S2 normal. No murmurs, rubs, or gallops.  ABDOMEN: Soft, nontender, nondistended. Bowel sounds present. No organomegaly or mass.  EXTREMITIES: No pedal edema, cyanosis, or clubbing.  NEUROLOGIC: Cranial nerves II through XII are intact. Muscle strength 5/5 in all  extremities. Sensation intact. Gait not checked.  PSYCHIATRIC: The patient is alert and oriented x 3.  SKIN: No obvious rash, lesion, or ulcer.   LABORATORY PANEL:   CBC Recent Labs  Lab 12/12/18 1330  WBC 8.9  HGB 15.8  HCT 49.7  PLT 322  MCV 90.2  MCH 28.7  MCHC 31.8  RDW 13.6  LYMPHSABS 0.7  MONOABS 0.6  EOSABS 0.1  BASOSABS 0.0   ------------------------------------------------------------------------------------------------------------------  Chemistries  Recent Labs  Lab 12/12/18 1330  NA 139  K 3.8  CL 100  CO2 30  GLUCOSE 213*  BUN 11  CREATININE 0.94  CALCIUM 8.7*  AST 23  ALT 41  ALKPHOS 59  BILITOT 0.8   ------------------------------------------------------------------------------------------------------------------ estimated creatinine clearance is 113.7 mL/min (by C-G formula based on SCr of 0.94 mg/dL). ------------------------------------------------------------------------------------------------------------------ No results for input(s): TSH, T4TOTAL, T3FREE, THYROIDAB in the last 72 hours.  Invalid input(s): FREET3   Coagulation profile No results for input(s): INR, PROTIME in the last  168 hours. ------------------------------------------------------------------------------------------------------------------- No results for input(s): DDIMER in the last 72 hours. -------------------------------------------------------------------------------------------------------------------  Cardiac Enzymes Recent Labs  Lab 12/12/18 1330  TROPONINI 0.05*   ------------------------------------------------------------------------------------------------------------------ Invalid input(s): POCBNP  ---------------------------------------------------------------------------------------------------------------  Urinalysis    Component Value Date/Time   COLORURINE YELLOW (A) 11/16/2017 0009   APPEARANCEUR CLEAR (A) 11/16/2017 0009   LABSPEC 1.034  (H) 11/16/2017 0009   PHURINE 6.0 11/16/2017 0009   GLUCOSEU NEGATIVE 11/16/2017 0009   HGBUR NEGATIVE 11/16/2017 0009   BILIRUBINUR NEGATIVE 11/16/2017 0009   KETONESUR NEGATIVE 11/16/2017 0009   PROTEINUR NEGATIVE 11/16/2017 0009   NITRITE NEGATIVE 11/16/2017 0009   LEUKOCYTESUR NEGATIVE 11/16/2017 0009     RADIOLOGY: Dg Chest Portable 1 View  Result Date: 12/12/2018 CLINICAL DATA:  Shortness of breath today.  History of COPD. EXAM: PORTABLE CHEST 1 VIEW COMPARISON:  Single-view of the chest 12/04/2018. PA and lateral chest 10/25/2017. FINDINGS: The lungs are clear. Heart size is upper normal. No pneumothorax or pleural effusion. No acute or focal bony abnormality. IMPRESSION: No acute disease. Electronically Signed   By: Drusilla Kannerhomas  Dalessio M.D.   On: 12/12/2018 13:44    EKG: Orders placed or performed during the hospital encounter of 12/12/18  . EKG 12-Lead  . EKG 12-Lead  . ED EKG  . ED EKG    IMPRESSION AND PLAN: 53 year old male patient with a known history of congestive heart failure COPD, type 2 diabetes mellitus, hypertension, schizophrenia presented to the emergency room for shortness of breath and wheezing.  -Acute COPD exacerbation Admit patient to medical floor IV Solu-Medrol 60 IV every 6 hourly Aggressive nebulization therapy Oxygen via nasal cannula  -Hypoxia secondary to COPD exacerbation Supplemental oxygen by nasal cannula  -Chronic congestive heart failure appears stable Medical management to continue  -Type 2 diabetes mellitus Diabetic diet with sliding scale coverage with insulin  -DVT prophylaxis subcu Lovenox daily  -Tobacco abuse Tobacco cessation counseled to the patient for 6 minutes Nicotine patch offered All the records are reviewed and case discussed with ED provider. Management plans discussed with the patient, family and they are in agreement.  CODE STATUS:Full code Code Status History    Date Active Date Inactive Code Status Order  ID Comments User Context   11/29/2018 1255 12/04/2018 1847 Full Code [id number]  Altamese DillingVachhani, Vaibhavkumar, MD ED   11/15/2017 2242 11/16/2017 2016 Full Code [id number]  Oralia ManisWillis, David, MD Inpatient   09/16/2017 1534 09/18/2017 1440 Full Code [id number]  Alford HighlandWieting, Richard, MD ED       TOTAL TIME TAKING CARE OF THIS PATIENT: 54 minutes.    Ihor AustinPavan Pyreddy M.D on 12/12/2018 at 3:28 PM  Between 7am to 6pm - Pager - [phone]  After 6pm go to [email or web address] - password [email or web address] [email or web address]  [facility]  Office  [phone]  CC: Primary care physician; [name], [name], MD

## 2018-12-12 NOTE — ED Notes (Signed)
Pt given dinner tray by medic.

## 2018-12-12 NOTE — Progress Notes (Signed)
Advanced care plan.  Purpose of the Encounter: CODE STATUS  Parties in Attendance: Patient  Patient's Decision Capacity: Okay  Subjective/Patient's story: Presented to emergency room for shortness of breath and wheezing   Objective/Medical story Has acute COPD exacerbation Patient needs nebulization treatments and Solu-Medrol aggressively  Goals of care determination:  Advance care directives goals of care and treatment plan discussed Patient wants everything done which includes CPR, intubation ventilator if the need arises   CODE STATUS: Full code   Time spent discussing advanced care planning: 16 minutes

## 2018-12-12 NOTE — ED Provider Notes (Signed)
Patient received in signout from Dr. Alphonzo Lemmings.  Patient arrives in moderate respiratory distress requiring supplemental oxygen for respiratory failure with hypoxia.  Exam and presentation most consistent with COPD exacerbation.  Still having wheezing use of accessory muscles and tachypnea after several rounds of nebulizers.  Flu negative.  Cardiac markers are better than previous.  Do suspect COPD.  No evidence of pneumonia.  Will discuss case with hospitalist for further medical management.   Willy Eddy, MD 12/12/18 [phone]

## 2018-12-12 NOTE — ED Notes (Signed)
Pt stuck a total of 5 times by ED RN's in attempt to collect all necessary labs. Currently need venous blood gas and potentially blood cultures but 22g won't pull back enough blood for these. Lab contacted in order to help obtain these.

## 2018-12-12 NOTE — ED Provider Notes (Addendum)
[facility] Emergency Department Provider Note  ____________________________________________   I have reviewed the triage vital signs and the nursing notes. Where available I have reviewed prior notes and, if possible and indicated, outside hospital notes.    HISTORY  Chief Complaint Shortness of Breath    HPI Paul Hernandez is a 53 y.o. male  History of CHF low EF 35%, COPD, diabetes mellitus hypertension and schizophrenia who is a very poor historian recently hospitalized for influenza A, comes in complaining of shortness of breath.  He cannot tell me much more about it.  Appears to be at his baseline per those who know him.  He denies any chest pain, he states he is hungry and he feels "okay".  Level 5 chart caveat; no further history available due to patient status.    Past Medical History:  Diagnosis Date  . CHF (congestive heart failure) (HCC)   . COPD (chronic obstructive pulmonary disease) (HCC)   . Diabetes mellitus without complication (HCC)   . Hypertension   . Mental health disorder     Patient Active Problem List   Diagnosis Date Noted  . Acute respiratory failure with hypoxia (HCC) 11/29/2018  . Influenza A 11/29/2018  . Hyperlipidemia, unspecified 11/29/2017  . Hypertension 11/29/2017  . Schizophrenia (HCC) 11/16/2017  . COPD (chronic obstructive pulmonary disease) (HCC) 11/15/2017  . Mental health disorder 11/15/2017  . Cerebral ischemia 11/15/2017  . Cerebral infarct (HCC) 11/15/2017  . Benign essential HTN 11/13/2017  . Diabetes type 2, controlled (HCC) 11/13/2017  . COPD exacerbation (HCC) 09/16/2017    Past Surgical History:  Procedure Laterality Date  . NO PAST SURGERIES      Prior to Admission medications   Medication Sig Start Date End Date Taking? Authorizing Provider  albuterol (PROVENTIL HFA;VENTOLIN HFA) 108 (90 Base) MCG/ACT inhaler Inhale 2 puffs into the lungs every 6 (six) hours as needed for wheezing or  shortness of breath. Patient taking differently: Inhale 1-2 puffs into the lungs every 4 (four) hours as needed for wheezing or shortness of breath.  08/13/17   Tommi Rumps, PA-C  albuterol (PROVENTIL) (2.5 MG/3ML) 0.083% nebulizer solution Take 2.5 mg by nebulization 4 (four) times daily as needed for wheezing or shortness of breath.     [provider]  amLODipine (NORVASC) 5 MG tablet Take 10 mg by mouth daily.    [provider]  aspirin EC 325 MG tablet Take 1 tablet (325 mg total) by mouth daily. 11/16/17   Enedina Finner, MD  Azilsartan-Chlorthalidone (EDARBYCLOR) 40-12.5 MG TABS Take 1 tablet by mouth daily.    [provider]  benazepril (LOTENSIN) 20 MG tablet Take 1 tablet (20 mg total) by mouth daily. 11/16/17   Enedina Finner, MD  benztropine (COGENTIN) 2 MG tablet Take 2 mg by mouth at bedtime.    [provider]  carvedilol (COREG) 25 MG tablet Take 1 tablet (25 mg total) by mouth 2 (two) times daily with a meal. 11/16/17   Enedina Finner, MD  Cholecalciferol (VITAMIN D3) 5000 units CAPS Take 5,000 Units by mouth daily.    [provider]  dapagliflozin propanediol (FARXIGA) 5 MG TABS tablet Take 5 mg by mouth daily.    [provider]  dextrose (GLUTOSE) 40 % GEL Take by mouth once as needed for low blood sugar. For blood sugar <70    [provider]  diphenhydrAMINE (BENADRYL) 25 mg capsule Take 25 mg by mouth at bedtime.  [provider]  ezetimibe (ZETIA) 10 MG tablet Take 10 mg by mouth daily.    [provider]  fluticasone (FLONASE) 50 MCG/ACT nasal spray Place 1 spray into both nostrils daily.    [provider]  guaifenesin (ROBITUSSIN) 100 MG/5ML syrup Take 200 mg by mouth every 4 (four) hours as needed for cough.    [provider]  haloperidol decanoate (HALDOL DECANOATE) 100 MG/ML injection Inject 90 mg into the muscle every 28 (twenty-eight) days.     [provider]   isosorbide-hydrALAZINE (BIDIL) 20-37.5 MG tablet Take 1 tablet by mouth 3 (three) times daily.    [provider]  rosuvastatin (CRESTOR) 40 MG tablet Take 40 mg by mouth daily.    [provider]  umeclidinium-vilanterol (ANORO ELLIPTA) 62.5-25 MCG/INH AEPB Inhale 1 puff into the lungs daily.    [provider]    Allergies Asa [aspirin]  Family History  Problem Relation Age of Onset  . CVA Mother   . CAD Father     Social History Social History   Tobacco Use  . Smoking status: Current Some Day Smoker    Packs/day: 0.50    Types: Cigarettes  . Smokeless tobacco: Never Used  Substance Use Topics  . Alcohol use: No  . Drug use: No    Review of Systems Constitutional: No fever/chills Eyes: No visual changes. ENT: No sore throat. No stiff neck no neck pain Cardiovascular: Denies chest pain. Respiratory: + shortness of breath. Gastrointestinal:   no vomiting.  No diarrhea.  No constipation. Genitourinary: Negative for dysuria. Musculoskeletal: Negative lower extremity swelling Skin: Negative for rash. Neurological: Negative for severe headaches, focal weakness or numbness.   ____________________________________________   PHYSICAL EXAM:  VITAL SIGNS: ED Triage Vitals  Enc Vitals Group     BP 12/12/18 1249 113/78     Pulse Rate 12/12/18 1249 92     Resp 12/12/18 1249 (!) 28     Temp 12/12/18 1249 98.2 F (36.8 C)     Temp Source 12/12/18 1249 Oral     SpO2 12/12/18 1249 93 %     Weight 12/12/18 1251 220 lb (99.8 kg)     Height 12/12/18 1251 6' (1.829 m)     Head Circumference --      Peak Flow --      Pain Score 12/12/18 1251 0     Pain Loc --      Pain Edu? --      Excl. in GC? --     Constitutional: Alert and oriented.  Patient has some accessory muscle use, he is able to speak in full sentences however.  Is difficult to tease out what is organic respiratory pathology versus some sort of psychiatric manifestations of his  underlying schizophrenia. Eyes: Conjunctivae are normal Head: Atraumatic HEENT: No congestion/rhinnorhea. Mucous membranes are moist.  Oropharynx non-erythematous Neck:   Nontender with no meningismus, no masses, no stridor Cardiovascular: Normal rate, regular rhythm. Grossly normal heart sounds.  Good peripheral circulation. Respiratory: Coarse breath sounds somewhat diminished in the bases, does not give a great respiratory effort when asked Abdominal: Soft and nontender. No distention. No guarding no rebound Back:  There is no focal tenderness or step off.  there is no midline tenderness there are no lesions noted. there is no CVA tenderness  Musculoskeletal: No lower extremity tenderness, no upper extremity tenderness. No joint effusions, no DVT signs strong distal pulses no edema Neurologic:  Normal speech and language. No  gross focal neurologic deficits are appreciated.  Skin:  Skin is warm, dry and intact. No rash noted. Psychiatric: Mood and affect are somewhat bizarre no evidence of tracking to internal stimuli or hallucinations. Speech and behavior are normal.  ____________________________________________   LABS (all labs ordered are listed, but only abnormal results are displayed)  Labs Reviewed  CBC WITH DIFFERENTIAL/PLATELET  COMPREHENSIVE METABOLIC PANEL  BRAIN NATRIURETIC PEPTIDE  TROPONIN I  INFLUENZA PANEL BY PCR (TYPE A & B)  BLOOD GAS, VENOUS    Pertinent labs  results that were available during my care of the patient were reviewed by me and considered in my medical decision making (see chart for details). ____________________________________________  EKG  I personally interpreted any EKGs ordered by me or triage Normal sinus rhythm at 80 bpm normal axis, no acute ST elevation or depression nonspecific intraventricular conduction delay, borderline right axis deviation. ____________________________________________  RADIOLOGY  Pertinent labs & imaging results  that were available during my care of the patient were reviewed by me and considered in my medical decision making (see chart for details). If possible, patient and/or family made aware of any abnormal findings.  No results found. ____________________________________________    PROCEDURES  Procedure(s) performed: None  Procedures  Critical Care performed: CRITICAL CARE Performed by: Jeanmarie Plant   Total critical care time: 35 minutes  Critical care time was exclusive of separately billable procedures and treating other patients.  Critical care was necessary to treat or prevent imminent or life-threatening deterioration.  Critical care was time spent personally by me on the following activities: development of treatment plan with patient and/or surrogate as well as nursing, discussions with consultants, evaluation of patient's response to treatment, examination of patient, obtaining history from patient or surrogate, ordering and performing treatments and interventions, ordering and review of laboratory studies, ordering and review of radiographic studies, pulse oximetry and re-evaluation of patient's condition.   ____________________________________________   INITIAL IMPRESSION / ASSESSMENT AND PLAN / ED COURSE  Pertinent labs & imaging results that were available during my care of the patient were reviewed by me and considered in my medical decision making (see chart for details).  Patient here with shortness of breath, he appears to be at his baseline in terms of his mentation.  He is having increased work of breathing but of course it is difficult to determine whether or not this represents organic pathology.  I told to take a deep breath he will but then he seems to take shallow breaths otherwise.  He does have a history of CO2 narcosis in the past.  We will obtain VBG, chest x-ray cardiac work-up including BNP, repeat flu as some people are getting A and B this  year.  Marland Kitchenb ----------------------------------------- 1:44 PM on 12/12/2018 -----------------------------------------  Signed out at the end of my shift to dr. Roxan Hockey       ____________________________________________   FINAL CLINICAL IMPRESSION(S) / ED DIAGNOSES  Final diagnoses:  None      This chart was dictated using voice recognition software.  Despite best efforts to proofread,  errors can occur which can change meaning.      Jeanmarie Plant, MD 12/12/18 1323    Jeanmarie Plant, MD 12/12/18 1345    Jeanmarie Plant, MD 01/03/19 [phone]

## 2018-12-12 NOTE — ED Notes (Signed)
Bed control contacted as pt is assigned to a room number that doesn't exist.

## 2018-12-12 NOTE — ED Triage Notes (Addendum)
Pt arrives with a complaints of shortness of breath. PT denies pain. PT a poor historian. Wheezing heard on auscultation. Labored breathing in triage.

## 2018-12-13 ENCOUNTER — Other Ambulatory Visit: Payer: Self-pay

## 2018-12-13 DIAGNOSIS — R0602 Shortness of breath: Secondary | ICD-10-CM | POA: Diagnosis not present

## 2018-12-13 DIAGNOSIS — J441 Chronic obstructive pulmonary disease with (acute) exacerbation: Secondary | ICD-10-CM | POA: Diagnosis not present

## 2018-12-13 LAB — BASIC METABOLIC PANEL
Anion gap: 9 (ref 5–15)
BUN: 18 mg/dL (ref 6–20)
CO2: 28 mmol/L (ref 22–32)
Calcium: 8.6 mg/dL — ABNORMAL LOW (ref 8.9–10.3)
Chloride: 100 mmol/L (ref 98–111)
Creatinine, Ser: 1 mg/dL (ref 0.61–1.24)
GFR calc Af Amer: >60 mL/min (ref >60)
GFR calc non Af Amer: >60 mL/min (ref >60)
Glucose, Bld: 264 mg/dL — ABNORMAL HIGH (ref 70–99)
Potassium: 4.1 mmol/L (ref 3.5–5.1)
Sodium: 137 mmol/L (ref 135–145)

## 2018-12-13 LAB — GLUCOSE, CAPILLARY
Glucose-Capillary: 347 mg/dL — ABNORMAL HIGH (ref 70–99)
Glucose-Capillary: 268 mg/dL — ABNORMAL HIGH (ref 70–99)

## 2018-12-13 LAB — CBC
HCT: 44.9 % (ref 39.0–52.0)
Hemoglobin: 14.4 g/dL (ref 13.0–17.0)
MCH: 29 pg (ref 26.0–34.0)
MCHC: 32.1 g/dL (ref 30.0–36.0)
MCV: 90.3 fL (ref 80.0–100.0)
Platelets: 283 10*3/uL (ref 150–400)
RBC: 4.97 MIL/uL (ref 4.22–5.81)
RDW: 13.3 % (ref 11.5–15.5)
WBC: 8.7 10*3/uL (ref 4.0–10.5)
nRBC: 0 % (ref 0.0–0.2)

## 2018-12-13 MED ORDER — PREDNISONE 50 MG PO TABS: 50.0000 mg | ORAL_TABLET | Freq: Every day | ORAL | Status: DC

## 2018-12-13 MED ORDER — METHYLPREDNISOLONE SODIUM SUCC 125 MG IJ SOLR: 60.0000 mg | INTRAMUSCULAR | Status: DC

## 2018-12-13 MED ORDER — PREDNISONE 10 MG PO TABS: ORAL_TABLET | ORAL | 0 refills | Status: DC

## 2018-12-13 NOTE — Discharge Summary (Signed)
[facility] [place] at [facility]   PATIENT NAME: Paul Hernandez    MR#:  [id number]  DATE OF BIRTH:  January 23, 1966  DATE OF ADMISSION:  12/12/2018 ADMITTING PHYSICIAN: Ihor Austin, MD  DATE OF DISCHARGE: 12/13/2018  PRIMARY CARE PHYSICIAN: Rexene Agent, MD    ADMISSION DIAGNOSIS:  COPD exacerbation (HCC) [J44.1]  DISCHARGE DIAGNOSIS:  acute on chronic COPD exacerbation ongoing tobacco abuse  SECONDARY DIAGNOSIS:   Past Medical History:  Diagnosis Date  . CHF (congestive heart failure) (HCC)   . COPD (chronic obstructive pulmonary disease) (HCC)   . Diabetes mellitus without complication (HCC)   . Hypertension   . Mental health disorder     HOSPITAL COURSE:  53 year old male patient with a known history of congestive heart failure COPD, type 2 diabetes mellitus, hypertension, schizophrenia presented to the emergency room for shortness of breath and wheezing.  *Acute on chronic COPD exacerbation IV Solu-Medrol 60 IV every 6 hourly--change to po taper Aggressive nebulization therapy sats >92% on RA  *Hypoxia secondary to COPD exacerbation Supplemental oxygen by nasal cannula -patient is weaned off oxygen. Sats above the 92% on room air.  *Type 2 diabetes mellitus Diabetic diet with sliding scale coverage with insulin  *History of chronic schizophrenia continue psych meds.  *DVT prophylaxis subcu Lovenox daily  Patient is hemodynamically stable. He will discharge back to group home. Patient is agreeable with it.  CONSULTS OBTAINED:    DRUG ALLERGIES:   Allergies  Allergen Reactions  . Asa [Aspirin]     DISCHARGE MEDICATIONS:   Allergies as of 12/13/2018      Reactions   Asa [aspirin]       Medication List    STOP taking these medications   aspirin EC 325 MG tablet   benazepril 20 MG tablet Commonly known as:  LOTENSIN     TAKE these medications   albuterol (2.5 MG/3ML) 0.083% nebulizer solution Commonly  known as:  PROVENTIL Take 2.5 mg by nebulization 4 (four) times daily as needed for wheezing or shortness of breath. What changed:  Another medication with the same name was changed. Make sure you understand how and when to take each.   albuterol 108 (90 Base) MCG/ACT inhaler Commonly known as:  PROVENTIL HFA;VENTOLIN HFA Inhale 2 puffs into the lungs every 6 (six) hours as needed for wheezing or shortness of breath. What changed:    how much to take  when to take this   amLODipine 5 MG tablet Commonly known as:  NORVASC Take 10 mg by mouth daily.   ANORO ELLIPTA 62.5-25 MCG/INH Aepb Generic drug:  umeclidinium-vilanterol Inhale 1 puff into the lungs daily.   benztropine 2 MG tablet Commonly known as:  COGENTIN Take 2 mg by mouth at bedtime.   carvedilol 25 MG tablet Commonly known as:  COREG Take 1 tablet (25 mg total) by mouth 2 (two) times daily with a meal.   dextrose 40 % Gel Commonly known as:  GLUTOSE Take by mouth once as needed for low blood sugar. For blood sugar <70   diphenhydrAMINE 25 mg capsule Commonly known as:  BENADRYL Take 25 mg by mouth at bedtime.   EDARBYCLOR 40-12.5 MG Tabs Generic drug:  Azilsartan-Chlorthalidone Take 1 tablet by mouth daily.   ezetimibe 10 MG tablet Commonly known as:  ZETIA Take 10 mg by mouth daily.   FARXIGA 5 MG Tabs tablet Generic drug:  dapagliflozin propanediol Take 5 mg by mouth daily.   fluticasone 50 MCG/ACT  nasal spray Commonly known as:  FLONASE Place 1 spray into both nostrils daily.   guaifenesin 100 MG/5ML syrup Commonly known as:  ROBITUSSIN Take 200 mg by mouth every 4 (four) hours as needed for cough.   haloperidol decanoate 100 MG/ML injection Commonly known as:  HALDOL DECANOATE Inject 90 mg into the muscle every 28 (twenty-eight) days.   isosorbide-hydrALAZINE 20-37.5 MG tablet Commonly known as:  BIDIL Take 1 tablet by mouth 3 (three) times daily.   predniSONE 10 MG tablet Commonly known  as:  DELTASONE Take 50 mg daily taper by 10 mg daily then stop   rosuvastatin 40 MG tablet Commonly known as:  CRESTOR Take 40 mg by mouth daily.   Vitamin D3 125 MCG (5000 UT) Caps Take 5,000 Units by mouth daily.       If you experience worsening of your admission symptoms, develop shortness of breath, life threatening emergency, suicidal or homicidal thoughts you must seek medical attention immediately by calling 911 or calling your MD immediately  if symptoms less severe.  You Must read complete instructions/literature along with all the possible adverse reactions/side effects for all the Medicines you take and that have been prescribed to you. Take any new Medicines after you have completely understood and accept all the possible adverse reactions/side effects.   Please note  You were cared for by a hospitalist during your hospital stay. If you have any questions about your discharge medications or the care you received while you were in the hospital after you are discharged, you can call the unit and asked to speak with the hospitalist on call if the hospitalist that took care of you is not available. Once you are discharged, your primary care physician will handle any further medical issues. Please note that NO REFILLS for any discharge medications will be authorized once you are discharged, as it is imperative that you return to your primary care physician (or establish a relationship with a primary care physician if you do not have one) for your aftercare needs so that they can reassess your need for medications and monitor your lab values. Today   SUBJECTIVE   Breathing improved. Denies any other complaints. VITAL SIGNS:  Blood pressure (!) 157/114, pulse 97, temperature 98.6 F (37 C), temperature source Oral, resp. rate 16, height 6' (1.829 m), weight 98.8 kg, SpO2 92 %.  I/O:    Intake/Output Summary (Last 24 hours) at 12/13/2018 1341 Last data filed at 12/12/2018  1732 Gross per 24 hour  Intake -  Output 500 ml  Net -500 ml    PHYSICAL EXAMINATION:  GENERAL:  53 y.o.-year-old patient lying in the bed with no acute distress.  EYES: Pupils equal, round, reactive to light and accommodation. No scleral icterus. Extraocular muscles intact.  HEENT: Head atraumatic, normocephalic. Oropharynx and nasopharynx clear.  NECK:  Supple, no jugular venous distention. No thyroid enlargement, no tenderness.  LUNGS: Normal breath sounds bilaterally, no wheezing, rales,rhonchi or crepitation. No use of accessory muscles of respiration.  CARDIOVASCULAR: S1, S2 normal. No murmurs, rubs, or gallops.  ABDOMEN: Soft, non-tender, non-distended. Bowel sounds present. No organomegaly or mass.  EXTREMITIES: No pedal edema, cyanosis, or clubbing.  NEUROLOGIC: Cranial nerves II through XII are intact. Muscle strength 5/5 in all extremities. Sensation intact. Gait not checked.  PSYCHIATRIC: The patient is alert and oriented x 3.  SKIN: No obvious rash, lesion, or ulcer.   DATA REVIEW:   CBC  Recent Labs  Lab 12/13/18 0300  WBC  8.7  HGB 14.4  HCT 44.9  PLT 283    Chemistries  Recent Labs  Lab 12/12/18 1330 12/13/18 0300  NA 139 137  K 3.8 4.1  CL 100 100  CO2 30 28  GLUCOSE 213* 264*  BUN 11 18  CREATININE 0.94 1.00  CALCIUM 8.7* 8.6*  AST 23  --   ALT 41  --   ALKPHOS 59  --   BILITOT 0.8  --     Microbiology Results   No results found for this or any previous visit (from the past 240 hour(s)).  RADIOLOGY:  Dg Chest Portable 1 View  Result Date: 12/12/2018 CLINICAL DATA:  Shortness of breath today.  History of COPD. EXAM: PORTABLE CHEST 1 VIEW COMPARISON:  Single-view of the chest 12/04/2018. PA and lateral chest 10/25/2017. FINDINGS: The lungs are clear. Heart size is upper normal. No pneumothorax or pleural effusion. No acute or focal bony abnormality. IMPRESSION: No acute disease. Electronically Signed   By: Drusilla Kanner M.D.   On: 12/12/2018  13:44     CODE STATUS:     Code Status Orders  (From admission, onward)         Start     Ordered   12/12/18 1845  Full code  Continuous     12/12/18 1844        Code Status History    Date Active Date Inactive Code Status Order ID Comments User Context   11/29/2018 1255 12/04/2018 1847 Full Code [id number]  Altamese Dilling, MD ED   11/15/2017 2242 11/16/2017 2016 Full Code [id number]  Oralia Manis, MD Inpatient   09/16/2017 1534 09/18/2017 1440 Full Code [id number]  Alford Highland, MD ED      TOTAL TIME TAKING CARE OF THIS PATIENT: *40* minutes.    Enedina Finner M.D on 12/13/2018 at 1:41 PM  Between 7am to 6pm - Pager - [phone] After 6pm go to [email or web address] - Social research officer, government  [facility]  Office  [phone]  CC: Primary care physician; Rexene Agent, MD

## 2018-12-13 NOTE — Progress Notes (Signed)
   12/13/18 1300  Clinical Encounter Type  Visited With Patient  Visit Type Follow-up  Ch checked on the patient three times in response to an OR. Pt was asleep. Ch will follow up later.

## 2018-12-13 NOTE — Progress Notes (Signed)
Pt is returning to group home.  Pt is very cooperative. IV removed and will review new medications with Group Home representative.

## 2018-12-13 NOTE — Progress Notes (Signed)
Inpatient Diabetes Program Recommendations  AACE/ADA: New Consensus Statement on Inpatient Glycemic Control (2015)  Target Ranges:  Prepandial:   less than 140 mg/dL      Peak postprandial:   less than 180 mg/dL (1-2 hours)      Critically ill patients:  140 - 180 mg/dL   Results for Paul Hernandez, Paul Hernandez (MRN [id number]) as of 12/13/2018 09:37  Ref. Range 12/12/2018 17:09 12/12/2018 18:51 12/12/2018 21:19 12/13/2018 07:35  Glucose-Capillary Latest Ref Range: 70 - 99 mg/dL 409 (H)  8 units NOVOLOG  334 (H) 349 (H)  4 units NOVOLOG  347 (H)  11 units NOVOLOG     Admit with: SOB/ COPD  History: DM, CHF, Schizophrenia, COPD  Home DM Meds: Farxiga 5 mg Daily  Current Orders: Novolog Moderate Correction Scale/ SSI (0-15 units) TID AC + HS      Getting Solumedrol 60 mg Q6 hours.  CBGs elevated likely due to steroids.    MD- Please consider the following in-hospital insulin adjustments while patient getting IV Steroids:  Start Lantus 15 units Daily (0.15 units/kg dosing based on weight of 98 kg)  Please start this AM      --Will follow patient during hospitalization--  Ambrose Finland RN, MSN, CDE Diabetes Coordinator Inpatient Glycemic Control Team Team Pager: [phone] (8a-5p)

## 2018-12-13 NOTE — Clinical Social Work Note (Signed)
Patient is medically ready for discharge today back to the group home. CSW notified [name], group home director [phone]. [name] states that they will come pick up patient today. CSW completed discharge packet and left it on the chart.   Ruthe Mannan MSW, [address] [phone]

## 2018-12-13 NOTE — Clinical Social Work Note (Signed)
Clinical Social Work Assessment  Patient Details  Name: Paul Hernandez MRN: [id number] Date of Birth: 11/17/1965  Date of referral:  12/13/18               Reason for consult:  Facility Placement                Permission sought to share information with:  Case Manager, Customer service manager, Family Supports Permission granted to share information::  Yes, Verbal Permission Granted  Name::        Agency::     Relationship::     Contact Information:     Housing/Transportation Living arrangements for the past 2 months:  Group Home Source of Information:  Patient Patient Interpreter Needed:  None Criminal Activity/Legal Involvement Pertinent to Current Situation/Hospitalization:  No - Comment as needed Significant Relationships:  Other Family Members Lives with:  Facility Resident Do you feel safe going back to the place where you live?  Yes Need for family participation in patient care:  Yes (Comment)  Care giving concerns:  Patient lives at [facility]    Social Worker assessment / plan:  CSW consulted for facility placement. CSW met with patient and he states that he lives in a group home and would like to return. CSW contacted [name], group home director and she states that patient does live there and can return when ready. She states that patient was completely independent prior to hospitalization. CSW will continue to follow for discharge planning.   Employment status:  Unemployed Forensic scientist:  Medicare PT Recommendations:  Not assessed at this time Information / Referral to community resources:     Patient/Family's Response to care:  Patient thanked CSW for speaking with him   Patient/Family's Understanding of and Emotional Response to Diagnosis, Current Treatment, and Prognosis:  Patient is in agreement with discharge back to group home.   Emotional Assessment Appearance:  Appears stated age Attitude/Demeanor/Rapport:    Affect  (typically observed):  Accepting, Pleasant Orientation:  Oriented to Self, Oriented to Place, Oriented to  Time Alcohol / Substance use:  Not Applicable Psych involvement (Current and /or in the community):  No (Comment)  Discharge Needs  Concerns to be addressed:  Discharge Planning Concerns Readmission within the last 30 days:  Yes Current discharge risk:  None Barriers to Discharge:  No Barriers Identified   Paul Hernandez 12/13/2018, 12:15 PM

## 2018-12-13 NOTE — Progress Notes (Signed)
   12/13/18 1358  Clinical Encounter Type  Visited With Patient  Visit Type Follow-up  Referral From Nurse  Spiritual Encounters  Spiritual Needs Prayer  Ch responded to an OR requesting for a prayer. Ch prayed with the pt for his healing. Pt responded with gratitude.

## 2018-12-13 NOTE — [place] FL2 (Signed)
[place] MEDICAID FL2 LEVEL OF CARE SCREENING TOOL     IDENTIFICATION  Patient Name: Paul Hernandez Birthdate: [birth year] Sex: male Admission Date (Current Location): 12/12/2018  [place] and [place] Number:  Chiropodist and Address:  [facility], [address] [place], [place] [address]      Provider Number: [id number]  Attending Physician Name and Address:  Enedina Finner, MD  Relative Name and Phone Number:       Current Level of Care: Hospital Recommended Level of Care: Other (Comment)(Group Home ) Prior Approval Number:    Date Approved/Denied:   PASRR Number:    Discharge Plan: Other (Comment)(Group Home )    Current Diagnoses: Patient Active Problem List   Diagnosis Date Noted  . COPD with acute exacerbation (HCC) 12/12/2018  . Acute respiratory failure with hypoxia (HCC) 11/29/2018  . Influenza A 11/29/2018  . Hyperlipidemia, unspecified 11/29/2017  . Hypertension 11/29/2017  . Schizophrenia (HCC) 11/16/2017  . COPD (chronic obstructive pulmonary disease) (HCC) 11/15/2017  . Mental health disorder 11/15/2017  . Cerebral ischemia 11/15/2017  . Cerebral infarct (HCC) 11/15/2017  . Benign essential HTN 11/13/2017  . Diabetes type 2, controlled (HCC) 11/13/2017  . COPD exacerbation (HCC) 09/16/2017    Orientation RESPIRATION BLADDER Height & Weight     Self, Place  Normal Continent Weight: 217 lb 13 oz (98.8 kg) Height:  6' (182.9 cm)  BEHAVIORAL SYMPTOMS/MOOD NEUROLOGICAL BOWEL NUTRITION STATUS  (none) (none) Continent Diet(Heart Healthy )  AMBULATORY STATUS COMMUNICATION OF NEEDS Skin   Independent Verbally Normal                       Personal Care Assistance Level of Assistance  Bathing, Feeding, Dressing Bathing Assistance: Independent Feeding assistance: Independent Dressing Assistance: Independent     Functional Limitations Info  Sight, Hearing, Speech Sight Info: Adequate Hearing Info:  Adequate Speech Info: Adequate    SPECIAL CARE FACTORS FREQUENCY                       Contractures Contractures Info: Not present    Additional Factors Info  Code Status, Allergies Code Status Info: Full Code  Allergies Info: ASA            Current Medications (12/13/2018):  This is the current hospital active medication list Current Facility-Administered Medications  Medication Dose Route Frequency Provider Last Rate Last Dose  . 0.9 %  sodium chloride infusion  250 mL Intravenous PRN Pyreddy, Vivien Rota, MD      . acetaminophen (TYLENOL) tablet 650 mg  650 mg Oral Q6H PRN Pyreddy, Vivien Rota, MD       Or  . acetaminophen (TYLENOL) suppository 650 mg  650 mg Rectal Q6H PRN Pyreddy, Vivien Rota, MD      . amLODipine (NORVASC) tablet 10 mg  10 mg Oral Daily Pyreddy, Vivien Rota, MD   10 mg at 12/13/18 0827  . aspirin EC tablet 325 mg  325 mg Oral Daily Pyreddy, Vivien Rota, MD   325 mg at 12/13/18 0827  . benztropine (COGENTIN) tablet 2 mg  2 mg Oral QHS Ihor Austin, MD   2 mg at 12/12/18 2217  . carvedilol (COREG) tablet 25 mg  25 mg Oral BID WC Pyreddy, Vivien Rota, MD   25 mg at 12/13/18 0827  . chlorthalidone (HYGROTON) tablet 12.5 mg  12.5 mg Oral Daily Lowella Bandy, RPH   12.5 mg at 12/13/18 6808  . cholecalciferol (VITAMIN D3) tablet  5,000 Units  5,000 Units Oral Daily Ihor Austin, MD   5,000 Units at 12/13/18 0825  . diphenhydrAMINE (BENADRYL) capsule 25 mg  25 mg Oral QHS Ihor Austin, MD   25 mg at 12/12/18 2217  . enoxaparin (LOVENOX) injection 40 mg  40 mg Subcutaneous Q24H Pyreddy, Pavan, MD      . ezetimibe (ZETIA) tablet 10 mg  10 mg Oral Daily Pyreddy, Vivien Rota, MD   10 mg at 12/13/18 0829  . fluticasone (FLONASE) 50 MCG/ACT nasal spray 1 spray  1 spray Each Nare Daily PRN Pyreddy, Pavan, MD      . guaiFENesin (ROBITUSSIN) 100 MG/5ML solution 200 mg  200 mg Oral Q4H PRN Pyreddy, Pavan, MD      . insulin aspart (novoLOG) injection 0-15 Units  0-15 Units Subcutaneous TID WC Ihor Austin, MD   8 Units at 12/13/18 1155  . insulin aspart (novoLOG) injection 0-5 Units  0-5 Units Subcutaneous QHS Ihor Austin, MD   4 Units at 12/12/18 2218  . insulin aspart (novoLOG) injection 12 Units  12 Units Subcutaneous Once Pyreddy, Pavan, MD      . ipratropium-albuterol (DUONEB) 0.5-2.5 (3) MG/3ML nebulizer solution 3 mL  3 mL Nebulization Q6H Pyreddy, Pavan, MD   3 mL at 12/13/18 0748  . irbesartan (AVAPRO) tablet 300 mg  300 mg Oral Daily Lowella Bandy, RPH   300 mg at 12/13/18 2778  . isosorbide-hydrALAZINE (BIDIL) 20-37.5 MG per tablet 1 tablet  1 tablet Oral TID Ihor Austin, MD   1 tablet at 12/13/18 0829  . nicotine (NICODERM CQ - dosed in mg/24 hours) patch 21 mg  21 mg Transdermal Daily Pyreddy, Pavan, MD      . ondansetron (ZOFRAN) tablet 4 mg  4 mg Oral Q6H PRN Pyreddy, Vivien Rota, MD       Or  . ondansetron (ZOFRAN) injection 4 mg  4 mg Intravenous Q6H PRN Pyreddy, Pavan, MD      . predniSONE (DELTASONE) tablet 50 mg  50 mg Oral Q breakfast Enedina Finner, MD      . rosuvastatin (CRESTOR) tablet 40 mg  40 mg Oral Daily Pyreddy, Vivien Rota, MD   40 mg at 12/13/18 0827  . senna-docusate (Senokot-S) tablet 1 tablet  1 tablet Oral QHS PRN Pyreddy, Vivien Rota, MD      . sodium chloride flush (NS) 0.9 % injection 3 mL  3 mL Intravenous Q12H Pyreddy, Vivien Rota, MD   3 mL at 12/13/18 0834  . sodium chloride flush (NS) 0.9 % injection 3 mL  3 mL Intravenous PRN Ihor Austin, MD         Discharge Medications: Please see discharge summary for a list of discharge medications.  Relevant Imaging Results:  Relevant Lab Results:   Additional Information    Jess Sulak  Rinaldo Ratel, [address]

## 2019-07-08 ENCOUNTER — Encounter: Payer: Self-pay | Admitting: Emergency Medicine

## 2019-07-08 ENCOUNTER — Emergency Department
Admission: EM | Admit: 2019-07-08 | Discharge: 2019-07-08 | Disposition: A | Discharge status: Home / Self Care | Payer: Medicare Other | Attending: Emergency Medicine | Admitting: Emergency Medicine

## 2019-07-08 ENCOUNTER — Other Ambulatory Visit: Payer: Self-pay

## 2019-07-08 ENCOUNTER — Emergency Department: Payer: Medicare Other

## 2019-07-08 DIAGNOSIS — R05 Cough: Secondary | ICD-10-CM | POA: Diagnosis not present

## 2019-07-08 DIAGNOSIS — I110 Hypertensive heart disease with heart failure: Secondary | ICD-10-CM | POA: Diagnosis not present

## 2019-07-08 DIAGNOSIS — F203 Undifferentiated schizophrenia: Secondary | ICD-10-CM | POA: Diagnosis not present

## 2019-07-08 DIAGNOSIS — F17210 Nicotine dependence, cigarettes, uncomplicated: Secondary | ICD-10-CM | POA: Diagnosis not present

## 2019-07-08 DIAGNOSIS — J441 Chronic obstructive pulmonary disease with (acute) exacerbation: Secondary | ICD-10-CM | POA: Diagnosis not present

## 2019-07-08 DIAGNOSIS — R2243 Localized swelling, mass and lump, lower limb, bilateral: Secondary | ICD-10-CM | POA: Diagnosis not present

## 2019-07-08 DIAGNOSIS — Z79899 Other long term (current) drug therapy: Secondary | ICD-10-CM | POA: Insufficient documentation

## 2019-07-08 DIAGNOSIS — E119 Type 2 diabetes mellitus without complications: Secondary | ICD-10-CM | POA: Insufficient documentation

## 2019-07-08 DIAGNOSIS — I5023 Acute on chronic systolic (congestive) heart failure: Secondary | ICD-10-CM | POA: Diagnosis not present

## 2019-07-08 DIAGNOSIS — R0602 Shortness of breath: Secondary | ICD-10-CM | POA: Diagnosis present

## 2019-07-08 LAB — CBC WITH DIFFERENTIAL/PLATELET
Abs Immature Granulocytes: 0.02 10*3/uL (ref 0.00–0.07)
Basophils Absolute: 0.1 10*3/uL (ref 0.0–0.1)
Basophils Relative: 1 %
Eosinophils Absolute: 0 10*3/uL (ref 0.0–0.5)
Eosinophils Relative: 0 %
HCT: 43.5 % (ref 39.0–52.0)
Hemoglobin: 13.6 g/dL (ref 13.0–17.0)
Immature Granulocytes: 0 %
Lymphocytes Relative: 8 %
Lymphs Abs: 0.7 10*3/uL (ref 0.7–4.0)
MCH: 28.4 pg (ref 26.0–34.0)
MCHC: 31.3 g/dL (ref 30.0–36.0)
MCV: 90.8 fL (ref 80.0–100.0)
Monocytes Absolute: 0.9 10*3/uL (ref 0.1–1.0)
Monocytes Relative: 10 %
Neutro Abs: 7.3 10*3/uL (ref 1.7–7.7)
Neutrophils Relative %: 81 %
Platelets: 341 10*3/uL (ref 150–400)
RBC: 4.79 MIL/uL (ref 4.22–5.81)
RDW: 14 % (ref 11.5–15.5)
WBC: 9 10*3/uL (ref 4.0–10.5)
nRBC: 0 % (ref 0.0–0.2)

## 2019-07-08 LAB — COMPREHENSIVE METABOLIC PANEL
ALT: 40 U/L (ref 0–44)
AST: 22 U/L (ref 15–41)
Albumin: 3.2 g/dL — ABNORMAL LOW (ref 3.5–5.0)
Alkaline Phosphatase: 50 U/L (ref 38–126)
Anion gap: 7 (ref 5–15)
BUN: 15 mg/dL (ref 6–20)
CO2: 33 mmol/L — ABNORMAL HIGH (ref 22–32)
Calcium: 8.4 mg/dL — ABNORMAL LOW (ref 8.9–10.3)
Chloride: 100 mmol/L (ref 98–111)
Creatinine, Ser: 1.01 mg/dL (ref 0.61–1.24)
GFR calc Af Amer: >60 mL/min (ref >60)
GFR calc non Af Amer: >60 mL/min (ref >60)
Glucose, Bld: 180 mg/dL — ABNORMAL HIGH (ref 70–99)
Potassium: 4.2 mmol/L (ref 3.5–5.1)
Sodium: 140 mmol/L (ref 135–145)
Total Bilirubin: 0.7 mg/dL (ref 0.3–1.2)
Total Protein: 5.8 g/dL — ABNORMAL LOW (ref 6.5–8.1)

## 2019-07-08 LAB — BRAIN NATRIURETIC PEPTIDE: B Natriuretic Peptide: 1598 pg/mL — ABNORMAL HIGH (ref 0.0–100.0)

## 2019-07-08 LAB — TROPONIN I (HIGH SENSITIVITY)
Troponin I (High Sensitivity): 69 ng/L — ABNORMAL HIGH (ref <18)
Troponin I (High Sensitivity): 77 ng/L — ABNORMAL HIGH (ref <18)

## 2019-07-08 MED ORDER — IPRATROPIUM-ALBUTEROL 0.5-2.5 (3) MG/3ML IN SOLN
3.0000 mL | RESPIRATORY_TRACT | Status: DC | PRN
  Administered 2019-07-08: 3 mL via RESPIRATORY_TRACT
  Filled 2019-07-08: qty 3

## 2019-07-08 MED ORDER — PREDNISONE 20 MG PO TABS: 60.0000 mg | ORAL_TABLET | Freq: Every day | ORAL | 0 refills | Status: AC

## 2019-07-08 MED ORDER — FUROSEMIDE 20 MG PO TABS: 20.0000 mg | ORAL_TABLET | Freq: Two times a day (BID) | ORAL | 0 refills | Status: DC

## 2019-07-08 MED ORDER — FUROSEMIDE 10 MG/ML IJ SOLN
40.0000 mg | Freq: Once | INTRAMUSCULAR | Status: AC
  Administered 2019-07-08: 40 mg via INTRAVENOUS
  Filled 2019-07-08: qty 4

## 2019-07-08 MED ORDER — ALBUTEROL SULFATE HFA 108 (90 BASE) MCG/ACT IN AERS: 2.0000 | INHALATION_SPRAY | Freq: Four times a day (QID) | RESPIRATORY_TRACT | 0 refills | Status: AC | PRN

## 2019-07-08 MED ORDER — PREDNISONE 20 MG PO TABS
60.0000 mg | ORAL_TABLET | Freq: Once | ORAL | Status: AC
  Administered 2019-07-08: 60 mg via ORAL
  Filled 2019-07-08: qty 3

## 2019-07-08 NOTE — ED Triage Notes (Signed)
Pt here with c/o difficulty breathing, sats 99% in triage, also here with htn, took his bp med at home, forgot his nebulizer treatment though. Appears in minimal resp distress, encouraged to slow his breathing which helped. Hx COPD.

## 2019-07-08 NOTE — ED Notes (Signed)
Pt ambulated in room with cont pulse ox. O2 sat remained >94% for duration of exercise. EDP Jessup present in room at end of exercise. Pt has had x4 unmeasured urine output since lasix admin.

## 2019-07-08 NOTE — ED Notes (Signed)
Called [facility] Chalk per pt request to pick pt up. Mr. Sharlett Iles states he is a friend and typically takes pt to and from his appointments and helps manage his medications. Discharge instructions, follow-up care, and medication changes reviewed over the phone with friend with pt permission. Friend states they will arrive in approx. 20 mins.

## 2019-07-08 NOTE — ED Provider Notes (Signed)
[facility] Emergency Department Provider Note   ____________________________________________   First MD Initiated Contact with Patient 07/08/19 1119     (approximate)  I have reviewed the triage vital signs and the nursing notes.   HISTORY  Chief Complaint Shortness of Breath and Hypertension    HPI Paul Hernandez is a 53 y.o. male with past medical history of COPD, CHF, hypertension, diabetes, and schizophrenia who presents to the ED complaining of shortness of breath.  Patient reports approximately 2 days of gradually worsening difficulty breathing.  This is been associated with a nonproductive cough and he denies any fevers or chest pain.  Patient states he did not take any medication for this at home.  He has also noticed increasing swelling in his bilateral lower extremities, denies pain in either extremity.        Past Medical History:  Diagnosis Date   CHF (congestive heart failure) (HCC)    COPD (chronic obstructive pulmonary disease) (HCC)    Diabetes mellitus without complication (HCC)    Hypertension    Mental health disorder     Patient Active Problem List   Diagnosis Date Noted   COPD with acute exacerbation (HCC) 12/12/2018   Acute respiratory failure with hypoxia (HCC) 11/29/2018   Influenza A 11/29/2018   Hyperlipidemia, unspecified 11/29/2017   Hypertension 11/29/2017   Schizophrenia (HCC) 11/16/2017   COPD (chronic obstructive pulmonary disease) (HCC) 11/15/2017   Mental health disorder 11/15/2017   Cerebral ischemia 11/15/2017   Cerebral infarct (HCC) 11/15/2017   Benign essential HTN 11/13/2017   Diabetes type 2, controlled (HCC) 11/13/2017   COPD exacerbation (HCC) 09/16/2017    Past Surgical History:  Procedure Laterality Date   NO PAST SURGERIES      Prior to Admission medications   Medication Sig Start Date End Date Taking? Authorizing Provider  albuterol (VENTOLIN HFA) 108 (90 Base) MCG/ACT  inhaler Inhale 2 puffs into the lungs every 6 (six) hours as needed for wheezing or shortness of breath. 07/08/19   Chesley Noon, MD  amLODipine (NORVASC) 5 MG tablet Take 10 mg by mouth daily.    [provider]  Azilsartan-Chlorthalidone (EDARBYCLOR) 40-12.5 MG TABS Take 1 tablet by mouth daily.    [provider]  benztropine (COGENTIN) 2 MG tablet Take 2 mg by mouth at bedtime.    [provider]  carvedilol (COREG) 25 MG tablet Take 1 tablet (25 mg total) by mouth 2 (two) times daily with a meal. 11/16/17   Enedina Finner, MD  Cholecalciferol (VITAMIN D3) 5000 units CAPS Take 5,000 Units by mouth daily.    [provider]  dapagliflozin propanediol (FARXIGA) 5 MG TABS tablet Take 5 mg by mouth daily.    [provider]  dextrose (GLUTOSE) 40 % GEL Take by mouth once as needed for low blood sugar. For blood sugar <70    [provider]  diphenhydrAMINE (BENADRYL) 25 mg capsule Take 25 mg by mouth at bedtime.     [provider]  ezetimibe (ZETIA) 10 MG tablet Take 10 mg by mouth daily.    [provider]  fluticasone (FLONASE) 50 MCG/ACT nasal spray Place 1 spray into both nostrils daily.    [provider]  furosemide (LASIX) 20 MG tablet Take 1 tablet (20 mg total) by mouth 2 (two) times daily. 07/08/19 08/07/19  Chesley Noon, MD  guaifenesin (ROBITUSSIN) 100 MG/5ML syrup Take 200 mg by mouth every 4 (four) hours as needed for cough.  [provider]  haloperidol decanoate (HALDOL DECANOATE) 100 MG/ML injection Inject 90 mg into the muscle every 28 (twenty-eight) days.     [provider]  isosorbide-hydrALAZINE (BIDIL) 20-37.5 MG tablet Take 1 tablet by mouth 3 (three) times daily.    [provider]  predniSONE (DELTASONE) 20 MG tablet Take 3 tablets (60 mg total) by mouth daily for 5 days. 07/08/19 07/13/19  Chesley NoonJessup, Terrion Poblano, MD  rosuvastatin (CRESTOR) 40 MG tablet Take 40 mg by mouth  daily.    [provider]  umeclidinium-vilanterol (ANORO ELLIPTA) 62.5-25 MCG/INH AEPB Inhale 1 puff into the lungs daily.    [provider]    Allergies Asa [aspirin]  Family History  Problem Relation Age of Onset   CVA Mother    CAD Father     Social History Social History   Tobacco Use   Smoking status: Current Some Day Smoker    Packs/day: 1.00    Years: 35.00    Pack years: 35.00    Types: Cigarettes   Smokeless tobacco: Never Used  Substance Use Topics   Alcohol use: No   Drug use: No    Review of Systems  Constitutional: No fever/chills Eyes: No visual changes. ENT: No sore throat. Cardiovascular: Denies chest pain. Respiratory: Positive for cough and shortness of breath. Gastrointestinal: No abdominal pain.  No nausea, no vomiting.  No diarrhea.  No constipation. Genitourinary: Negative for dysuria. Musculoskeletal: Negative for back pain. Skin: Negative for rash. Neurological: Negative for headaches, focal weakness or numbness.  ____________________________________________   PHYSICAL EXAM:  VITAL SIGNS: ED Triage Vitals  Enc Vitals Group     BP 07/08/19 1037 (!) 133/118     Pulse Rate 07/08/19 1034 (!) 104     Resp 07/08/19 1034 (!) 26     Temp 07/08/19 1034 98.5 F (36.9 C)     Temp Source 07/08/19 1034 Oral     SpO2 07/08/19 1037 98 %     Weight 07/08/19 1034 261 lb (118.4 kg)     Height 07/08/19 1034 5\' 11"  (1.803 m)     Head Circumference --      Peak Flow --      Pain Score 07/08/19 1041 0     Pain Loc --      Pain Edu? --      Excl. in GC? --     Constitutional: Alert and oriented. Eyes: Conjunctivae are normal. Head: Atraumatic. Nose: No congestion/rhinnorhea. Mouth/Throat: Mucous membranes are moist. Neck: Normal ROM Cardiovascular: Normal rate, regular rhythm. Grossly normal heart sounds. Respiratory: Mild respiratory distress with accessory muscle use.  Inspiratory and expiratory wheezing  throughout. Gastrointestinal: Soft and nontender. No distention. Genitourinary: deferred Musculoskeletal: No lower extremity tenderness, 2+ pitting edema to bilateral lower extremities. Neurologic:  Normal speech and language. No gross focal neurologic deficits are appreciated. Skin:  Skin is warm, dry and intact. No rash noted. Psychiatric: Mood and affect are normal. Speech and behavior are normal.  ____________________________________________   LABS (all labs ordered are listed, but only abnormal results are displayed)  Labs Reviewed  COMPREHENSIVE METABOLIC PANEL - Abnormal; Notable for the following components:      Result Value   CO2 33 (*)    Glucose, Bld 180 (*)    Calcium 8.4 (*)    Total Protein 5.8 (*)    Albumin 3.2 (*)    All other components within normal limits  BRAIN NATRIURETIC PEPTIDE - Abnormal; Notable for the following components:  B Natriuretic Peptide 1,598.0 (*)    All other components within normal limits  TROPONIN I (HIGH SENSITIVITY) - Abnormal; Notable for the following components:   Troponin I (High Sensitivity) 69 (*)    All other components within normal limits  TROPONIN I (HIGH SENSITIVITY) - Abnormal; Notable for the following components:   Troponin I (High Sensitivity) 77 (*)    All other components within normal limits  CBC WITH DIFFERENTIAL/PLATELET   ____________________________________________  EKG  ED ECG REPORT I, Blake Divine, the attending physician, personally viewed and interpreted this ECG.   Date: 07/08/2019  EKG Time: 10:40  Rate: 106  Rhythm: sinus tachycardia  Axis: Normal  Intervals:none  ST&T Change: None   PROCEDURES  Procedure(s) performed (including Critical Care):  Procedures   ____________________________________________   INITIAL IMPRESSION / ASSESSMENT AND PLAN / ED COURSE       53 year old male with history of schizophrenia, COPD, and CHF presents to the ED with increasing shortness of breath  over the past couple of days, denies chest pain.  EKG without acute ischemic changes, low suspicion for ACS as he denies chest pain and symptoms more likely pulmonary in origin.  Will treat wheezing with steroids and duo nebs, there may also be a component of CHF given his lower extremity edema.  It appears that medication compliance is likely an issue for this patient given his comorbid mental illness.  Patient with improvement in his breathing following treatment for COPD exacerbation.  He is also noted to have elevation in BNP along with pulmonary vascular congestion on his chest x-ray, will give dose of Lasix.  He is able to ambulate about the room with no respiratory distress or hypoxia.  Troponin mildly elevated, however this is not significantly changed on recheck.  Suspect this is secondary to CHF rather than ACS.  Patient appropriate for outpatient follow-up, provided with referral to CHF clinic and notified staff at patient's group home of need for close follow-up.  Will prescribe Lasix and steroids for COPD exacerbation, as well as refill of albuterol.      ____________________________________________   FINAL CLINICAL IMPRESSION(S) / ED DIAGNOSES  Final diagnoses:  COPD exacerbation ([place])  Acute on chronic systolic congestive heart failure ([place])  Undifferentiated schizophrenia [facility])     ED Discharge Orders         Ordered    furosemide (LASIX) 20 MG tablet  2 times daily     07/08/19 1413    predniSONE (DELTASONE) 20 MG tablet  Daily     07/08/19 1413    albuterol (VENTOLIN HFA) 108 (90 Base) MCG/ACT inhaler  Every 6 hours PRN     07/08/19 1413    AMB referral to CHF clinic     07/08/19 1414           Note:  This document was prepared using Dragon voice recognition software and may include unintentional dictation errors.   Blake Divine, MD 07/08/19 1700

## 2019-07-08 NOTE — ED Notes (Signed)
IV inserted, pt a very hard stick.

## 2019-07-09 ENCOUNTER — Ambulatory Visit: Payer: Medicare Other | Admitting: Family

## 2019-07-09 ENCOUNTER — Other Ambulatory Visit: Payer: Self-pay | Admitting: Family

## 2019-07-09 ENCOUNTER — Ambulatory Visit
Admission: RE | Admit: 2019-07-09 | Discharge: 2019-07-09 | Disposition: A | Discharge status: Home / Self Care | Payer: Medicare Other | Source: Ambulatory Visit | Attending: Family | Admitting: Family

## 2019-07-09 ENCOUNTER — Encounter: Payer: Self-pay | Admitting: Family

## 2019-07-09 ENCOUNTER — Other Ambulatory Visit: Payer: Self-pay

## 2019-07-09 VITALS — BP 160/124 | HR 116 | Resp 18 | Ht 72.0 in | Wt 262.1 lb

## 2019-07-09 DIAGNOSIS — I110 Hypertensive heart disease with heart failure: Secondary | ICD-10-CM | POA: Diagnosis not present

## 2019-07-09 DIAGNOSIS — I890 Lymphedema, not elsewhere classified: Secondary | ICD-10-CM | POA: Diagnosis not present

## 2019-07-09 DIAGNOSIS — Z7984 Long term (current) use of oral hypoglycemic drugs: Secondary | ICD-10-CM | POA: Diagnosis not present

## 2019-07-09 DIAGNOSIS — I5023 Acute on chronic systolic (congestive) heart failure: Secondary | ICD-10-CM

## 2019-07-09 DIAGNOSIS — F209 Schizophrenia, unspecified: Secondary | ICD-10-CM | POA: Insufficient documentation

## 2019-07-09 DIAGNOSIS — F17210 Nicotine dependence, cigarettes, uncomplicated: Secondary | ICD-10-CM | POA: Insufficient documentation

## 2019-07-09 DIAGNOSIS — J449 Chronic obstructive pulmonary disease, unspecified: Secondary | ICD-10-CM | POA: Insufficient documentation

## 2019-07-09 DIAGNOSIS — E119 Type 2 diabetes mellitus without complications: Secondary | ICD-10-CM | POA: Diagnosis not present

## 2019-07-09 DIAGNOSIS — Z79899 Other long term (current) drug therapy: Secondary | ICD-10-CM | POA: Diagnosis not present

## 2019-07-09 DIAGNOSIS — Z8249 Family history of ischemic heart disease and other diseases of the circulatory system: Secondary | ICD-10-CM | POA: Diagnosis not present

## 2019-07-09 DIAGNOSIS — I10 Essential (primary) hypertension: Secondary | ICD-10-CM

## 2019-07-09 DIAGNOSIS — F203 Undifferentiated schizophrenia: Secondary | ICD-10-CM

## 2019-07-09 DIAGNOSIS — R0602 Shortness of breath: Secondary | ICD-10-CM | POA: Insufficient documentation

## 2019-07-09 DIAGNOSIS — J42 Unspecified chronic bronchitis: Secondary | ICD-10-CM

## 2019-07-09 LAB — BASIC METABOLIC PANEL
Anion gap: 8 (ref 5–15)
BUN: 18 mg/dL (ref 6–20)
CO2: 30 mmol/L (ref 22–32)
Calcium: 8.5 mg/dL — ABNORMAL LOW (ref 8.9–10.3)
Chloride: 100 mmol/L (ref 98–111)
Creatinine, Ser: 1 mg/dL (ref 0.61–1.24)
GFR calc Af Amer: >60 mL/min (ref >60)
GFR calc non Af Amer: >60 mL/min (ref >60)
Glucose, Bld: 232 mg/dL — ABNORMAL HIGH (ref 70–99)
Potassium: 4.2 mmol/L (ref 3.5–5.1)
Sodium: 138 mmol/L (ref 135–145)

## 2019-07-09 LAB — BRAIN NATRIURETIC PEPTIDE: B Natriuretic Peptide: 1763 pg/mL — ABNORMAL HIGH (ref 0.0–100.0)

## 2019-07-09 MED ORDER — SODIUM CHLORIDE FLUSH 0.9 % IV SOLN
INTRAVENOUS | Status: AC
  Filled 2019-07-09: qty 20

## 2019-07-09 MED ORDER — POTASSIUM CHLORIDE CRYS ER 20 MEQ PO TBCR
EXTENDED_RELEASE_TABLET | ORAL | Status: AC
  Administered 2019-07-09: 16:00:00 40 meq
  Filled 2019-07-09: qty 2

## 2019-07-09 MED ORDER — SACUBITRIL-VALSARTAN 24-26 MG PO TABS: 1.0000 | ORAL_TABLET | Freq: Two times a day (BID) | ORAL | 3 refills | Status: DC

## 2019-07-09 MED ORDER — FUROSEMIDE 10 MG/ML IJ SOLN
INTRAMUSCULAR | Status: AC
  Filled 2019-07-09: qty 8

## 2019-07-09 MED ORDER — POTASSIUM CHLORIDE CRYS ER 20 MEQ PO TBCR: 40.0000 meq | EXTENDED_RELEASE_TABLET | Freq: Once | ORAL | Status: DC

## 2019-07-09 MED ORDER — FUROSEMIDE 10 MG/ML IJ SOLN
80.0000 mg | Freq: Once | INTRAMUSCULAR | Status: AC
  Administered 2019-07-09: 16:00:00 80 mg via INTRAVENOUS

## 2019-07-09 NOTE — Progress Notes (Addendum)
Patient ID: Paul Hernandez, male    DOB: 1966-04-01, 53 y.o.   MRN: [id number]  HPI  [name] is a 53 y/o male with a history of HTN, DM, COPD, schizophrenia, current tobacco use and chronic heart failure.   Echo report from 11/30/2018 reviewed and showed an EF of 20-25%.  Was in the ED 07/08/2019 due to COPD/HF exacerbation. Prescribed lasix and prednisone and released.   He presents today for a follow-up visit with a chief complaint of minimal shortness of breath upon moderate exertion. He says that this has been present for quite awhile but is a "lot better" since he was last here 2 days ago. He has associated pedal edema along with this. He denies any difficulty sleeping, dizziness, abdominal distention, palpitations, chest pain, cough, fatigue or weight gain.    Received 80mg  IV lasix/ 40meq PO potassium 2 days ago.  Past Medical History:  Diagnosis Date  . CHF (congestive heart failure) (HCC)   . COPD (chronic obstructive pulmonary disease) (HCC)   . Diabetes mellitus without complication (HCC)   . Hypertension   . Mental health disorder    Past Surgical History:  Procedure Laterality Date  . NO PAST SURGERIES     Family History  Problem Relation Age of Onset  . CVA Mother   . CAD Father    Social History   Tobacco Use  . Smoking status: Current Some Day Smoker    Packs/day: 1.00    Years: 35.00    Pack years: 35.00    Types: Cigarettes  . Smokeless tobacco: Never Used  Substance Use Topics  . Alcohol use: No   Allergies  Allergen Reactions  . Asa [Aspirin]    Prior to Admission medications   Medication Sig Start Date End Date Taking? Authorizing Provider  albuterol (VENTOLIN HFA) 108 (90 Base) MCG/ACT inhaler Inhale 2 puffs into the lungs every 6 (six) hours as needed for wheezing or shortness of breath. 07/08/19  Yes Chesley NoonJessup, Charles, MD  amLODipine (NORVASC) 5 MG tablet Take 10 mg by mouth daily.   Yes [provider]  benztropine (COGENTIN) 2 MG  tablet Take 2 mg by mouth at bedtime.   Yes [provider]  carvedilol (COREG) 25 MG tablet Take 1 tablet (25 mg total) by mouth 2 (two) times daily with a meal. 11/16/17  Yes Enedina FinnerPatel, Sona, MD  Cholecalciferol (VITAMIN D3) 5000 units CAPS Take 5,000 Units by mouth daily.   Yes [provider]  dapagliflozin propanediol (FARXIGA) 5 MG TABS tablet Take 5 mg by mouth daily.   Yes [provider]  dextrose (GLUTOSE) 40 % GEL Take by mouth once as needed for low blood sugar. For blood sugar <70   Yes [provider]  diphenhydrAMINE (BENADRYL) 25 mg capsule Take 25 mg by mouth at bedtime.    Yes [provider]  ezetimibe (ZETIA) 10 MG tablet Take 10 mg by mouth daily.   Yes [provider]  fluticasone (FLONASE) 50 MCG/ACT nasal spray Place 1 spray into both nostrils daily.   Yes [provider]  Fluticasone-Umeclidin-Vilant (TRELEGY ELLIPTA) 100-62.5-25 MCG/INH AEPB Inhale 1 puff into the lungs.   Yes [provider]  furosemide (LASIX) 20 MG tablet Take 1 tablet (20 mg total) by mouth 2 (two) times daily. Patient taking differently: Take 40 mg by mouth daily.  07/08/19 08/07/19 Yes Chesley NoonJessup, Charles, MD  guaifenesin (ROBITUSSIN) 100 MG/5ML syrup Take 200 mg by mouth every 4 (four) hours as  needed for cough.   Yes [provider]  haloperidol decanoate (HALDOL DECANOATE) 100 MG/ML injection Inject 90 mg into the muscle every 28 (twenty-eight) days.    Yes [provider]  isosorbide-hydrALAZINE (BIDIL) 20-37.5 MG tablet Take 1 tablet by mouth 3 (three) times daily.   Yes [provider]  metFORMIN (GLUCOPHAGE) 500 MG tablet Take 1,000 mg by mouth 2 (two) times daily with a meal.   Yes [provider]  predniSONE (DELTASONE) 20 MG tablet Take 3 tablets (60 mg total) by mouth daily for 5 days. 07/08/19 07/13/19 Yes Chesley Noon, MD  rosuvastatin (CRESTOR) 40 MG tablet Take 40 mg by mouth daily.   Yes  [provider]  sacubitril-valsartan (ENTRESTO) 24-26 MG Take 1 tablet by mouth 2 (two) times daily. 07/09/19  Yes Delma Freeze, FNP    Review of Systems  Constitutional: Negative for appetite change, fatigue and fever.  HENT: Negative for congestion, rhinorrhea and sore throat.   Eyes: Negative.   Respiratory: Positive for shortness of breath (much better). Negative for cough and chest tightness.   Cardiovascular: Positive for leg swelling (lower legs). Negative for chest pain and palpitations.  Gastrointestinal: Negative for abdominal distention and abdominal pain.  Endocrine: Negative.   Musculoskeletal: Negative for arthralgias and back pain.  Skin: Negative for rash and wound.  Allergic/Immunologic: Negative.   Neurological: Negative for dizziness and light-headedness.  Hematological: Negative for adenopathy. Does not bruise/bleed easily.  Psychiatric/Behavioral: Negative for dysphoric mood and sleep disturbance (sleeps with 2 pillows). The patient is not nervous/anxious.     Vitals:   07/11/19 0919  BP: (!) 104/93  Pulse: (!) 102  Resp: 18  SpO2: 96%  Weight: 247 lb 2 oz (112.1 kg)  Height: 6' (1.829 m)   Wt Readings from Last 3 Encounters:  07/11/19 247 lb 2 oz (112.1 kg)  07/09/19 262 lb 2 oz (118.9 kg)  07/08/19 261 lb (118.4 kg)   Lab Results  Component Value Date   CREATININE 1.00 07/09/2019   CREATININE 1.01 07/08/2019   CREATININE 1.00 12/13/2018    Physical Exam Constitutional:      Appearance: Normal appearance.  HENT:     Head: Normocephalic and atraumatic.  Neck:     Musculoskeletal: Normal range of motion and neck supple.  Cardiovascular:     Rate and Rhythm: Tachycardia present. Rhythm irregular.  Pulmonary:     Breath sounds: Rales (lower lobes but improved) present. No wheezing.  Abdominal:     General: There is no distension.     Palpations: Abdomen is soft.  Musculoskeletal:        General: No tenderness.     Right lower leg:  Edema (3+ pitting up to knee) present.     Left lower leg: Edema (3+ pitting up to knee) present.  Skin:    General: Skin is warm and dry.  Neurological:     General: No focal deficit present.     Mental Status: He is alert and oriented to person, place, and time.  Psychiatric:        Mood and Affect: Mood normal.        Behavior: Behavior normal.    Assessment & Plan:  1: Acute on Chronic heart failure with reduced ejection fraction- - NYHA class II - moderately fluid overloaded today although much better from 2 days ago - weighing daily and reminded to call for an overnight weight gain of >2 pounds or a weekly weight gain of >  5 pounds - weight down 15 pounds from last visit here 2 days ago - will send patient for another dose of 80mg  IV lasix/ 71meq PO potassium today - BMP/BNP to be drawn - entresto 24/26mg  twice daily started at last visit - depending on lab results today, may need to recheck BMP at next visit - not adding salt to his food - drinking "a lot" of water but he is unable to quantify amount - saw cardiology Clayborn Bigness) 05/01/18 - BNP 07/08/2019 was 1598.0  2: HTN- - BP looks good today, much lower than previous visit - may need to stop amlodipine if BP continues to drop - BMP on 07/08/2019 reviewed and showed sodium 140, potassium 4.2, creatinine 1.01 and GFR >60  3: DM- - A1c 11/29/2018 was 8.3%  4: Schizophrenia- - living at a group home called [facility] - patient cooperative but is confused about details of health  5: COPD- - saw pulmonology Ashby Dawes) 11/29/17 - continues to smoke 1 ppd / cigarettes - complete cessation discussed for 3 minutes with him  6: Lymphedema- - stage 2 - not elevating his legs much and he was encouraged to elevate them when sitting for long periods of time - caregiver says that a box arrived late yesterday which could be his compression socks; they will check upon return home today - limited in his ability to exercise  due to his shortness of breath - consider lymphapress compression boots if edema persists  Facility medication list was reviewed.  Return in 3 days for recheck of symptoms.

## 2019-07-09 NOTE — Progress Notes (Signed)
Patient ID: Paul Hernandez, male    DOB: 11/05/1965, 53 y.o.   MRN: [id number]  HPI  Paul Hernandez is a 53 y/o male with a history of HTN, DM, COPD, schizophrenia, current tobacco use and chronic heart failure.   Echo report from 11/30/2018 reviewed and showed an EF of 20-25%.  Was in the ED 07/08/2019 due to COPD/HF exacerbation. Prescribed lasix and prednisone and released.   He presents today for his initial visit with a chief complaint of moderate shortness of breath upon little exertion. He is unsure of how long he's felt short of breath. Caregiver with him says that he got short of breath just getting out of the car into the wheelchair. He has associated fatigue and leg edema along with this. He denies any difficulty sleeping, depression, dizziness, abdominal distention, palpitations, chest pain, cough or fatigue. Currently being weighed weekly at the group home.   Past Medical History:  Diagnosis Date  . CHF (congestive heart failure) (HCC)   . COPD (chronic obstructive pulmonary disease) (HCC)   . Diabetes mellitus without complication (HCC)   . Hypertension   . Mental health disorder    Past Surgical History:  Procedure Laterality Date  . NO PAST SURGERIES     Family History  Problem Relation Age of Onset  . CVA Mother   . CAD Father    Social History   Tobacco Use  . Smoking status: Current Some Day Smoker    Packs/day: 1.00    Years: 35.00    Pack years: 35.00    Types: Cigarettes  . Smokeless tobacco: Never Used  Substance Use Topics  . Alcohol use: No   Allergies  Allergen Reactions  . Asa [Aspirin]    Prior to Admission medications   Medication Sig Start Date End Date Taking? Authorizing Provider  albuterol (VENTOLIN HFA) 108 (90 Base) MCG/ACT inhaler Inhale 2 puffs into the lungs every 6 (six) hours as needed for wheezing or shortness of breath. 07/08/19  Yes Chesley Noon, MD  amLODipine (NORVASC) 5 MG tablet Take 10 mg by mouth daily.   Yes [provider]  Azilsartan-Chlorthalidone (EDARBYCLOR) 40-12.5 MG TABS Take 1 tablet by mouth daily.   Yes [provider]  benztropine (COGENTIN) 2 MG tablet Take 2 mg by mouth at bedtime.   Yes [provider]  carvedilol (COREG) 25 MG tablet Take 1 tablet (25 mg total) by mouth 2 (two) times daily with a meal. 11/16/17  Yes Enedina Finner, MD  Cholecalciferol (VITAMIN D3) 5000 units CAPS Take 5,000 Units by mouth daily.   Yes [provider]  dapagliflozin propanediol (FARXIGA) 5 MG TABS tablet Take 5 mg by mouth daily.   Yes [provider]  dextrose (GLUTOSE) 40 % GEL Take by mouth once as needed for low blood sugar. For blood sugar <70   Yes [provider]  diphenhydrAMINE (BENADRYL) 25 mg capsule Take 25 mg by mouth at bedtime.    Yes [provider]  ezetimibe (ZETIA) 10 MG tablet Take 10 mg by mouth daily.   Yes [provider]  fluticasone (FLONASE) 50 MCG/ACT nasal spray Place 1 spray into both nostrils daily.   Yes [provider]  Fluticasone-Umeclidin-Vilant (TRELEGY ELLIPTA) 100-62.5-25 MCG/INH AEPB Inhale 1 puff into the lungs.   Yes [provider]  guaifenesin (ROBITUSSIN) 100 MG/5ML syrup Take 200 mg by mouth every 4 (four) hours as needed for cough.   Yes [provider]  haloperidol decanoate (  HALDOL DECANOATE) 100 MG/ML injection Inject 90 mg into the muscle every 28 (twenty-eight) days.    Yes [provider]  isosorbide-hydrALAZINE (BIDIL) 20-37.5 MG tablet Take 1 tablet by mouth 3 (three) times daily.   Yes [provider]  metFORMIN (GLUCOPHAGE) 500 MG tablet Take 1,000 mg by mouth 2 (two) times daily with a meal.   Yes [provider]  rosuvastatin (CRESTOR) 40 MG tablet Take 40 mg by mouth daily.   Yes [provider]  furosemide (LASIX) 20 MG tablet Take 1 tablet (20 mg total) by mouth 2 (two) times daily. Patient not taking: Reported on 07/09/2019  07/08/19 08/07/19  Chesley NoonJessup, Charles, MD  predniSONE (DELTASONE) 20 MG tablet Take 3 tablets (60 mg total) by mouth daily for 5 days. Patient not taking: Reported on 07/09/2019 07/08/19 07/13/19  Chesley NoonJessup, Charles, MD     Review of Systems  Constitutional: Negative for appetite change, fatigue and fever.  HENT: Negative for congestion, rhinorrhea and sore throat.   Eyes: Negative.   Respiratory: Positive for shortness of breath (with minimal exertion). Negative for cough and chest tightness.   Cardiovascular: Positive for leg swelling (ankles). Negative for chest pain and palpitations.  Gastrointestinal: Negative for abdominal distention and abdominal pain.  Endocrine: Negative.   Musculoskeletal: Negative for arthralgias and back pain.  Skin: Negative for rash and wound.  Allergic/Immunologic: Negative.   Neurological: Negative for dizziness and light-headedness.  Hematological: Negative for adenopathy. Does not bruise/bleed easily.  Psychiatric/Behavioral: Negative for dysphoric mood and sleep disturbance (sleeps with 2 pillows). The patient is not nervous/anxious.     Vitals:   07/09/19 1416  BP: (!) 160/124  Pulse: (!) 116  Resp: 18  SpO2: 97%  Weight: 262 lb 2 oz (118.9 kg)  Height: 6' (1.829 m)   Wt Readings from Last 3 Encounters:  07/09/19 262 lb 2 oz (118.9 kg)  07/08/19 261 lb (118.4 kg)  12/12/18 217 lb 13 oz (98.8 kg)   Lab Results  Component Value Date   CREATININE 1.01 07/08/2019   CREATININE 1.00 12/13/2018   CREATININE 0.94 12/12/2018    Physical Exam Constitutional:      Appearance: Normal appearance.  HENT:     Head: Normocephalic and atraumatic.  Neck:     Musculoskeletal: Normal range of motion and neck supple.  Cardiovascular:     Rate and Rhythm: Tachycardia present. Rhythm irregular.  Pulmonary:     Breath sounds: Wheezing (lower lobes) and rales (lower lobes) present.  Abdominal:     General: There is no distension.     Palpations: Abdomen is  soft.  Musculoskeletal:        General: No tenderness.     Right lower leg: Edema (3+ pitting up to knee) present.     Left lower leg: Edema (3+ pitting up to knee) present.  Skin:    General: Skin is warm and dry.  Neurological:     General: No focal deficit present.     Mental Status: He is alert and oriented to person, place, and time.  Psychiatric:        Mood and Affect: Mood normal.        Behavior: Behavior normal.    Assessment & Plan:  1: Acute on Chronic heart failure with reduced ejection fraction- - NYHA class III/IV - moderately fluid overloaded today with edema and shortness of breath - being weighed weekly; order was written for patient to be weighed daily and to call for an  overnight weight gain of >2 pounds or a weekly weight gain of >5 pounds - will send patient for 80mg  IV lasix/ 63meq PO potassium today - BMP/BNP to be drawn - will stop erdarbycor and will begin entresto 24/26mg  twice daily - order written for his furosemide to be given as 40mg  daily instead of 20mg  BID - not adding salt to his food - drinking "a lot" of water but he is unable to quantify amount - saw cardiology Clayborn Bigness) 05/01/18 - BNP 07/08/2019 was 1598.0  2: HTN- - BP elevated today - giving IV lasix and changing meds per above - BMP on 07/08/2019 reviewed and showed sodium 140, potassium 4.2, creatinine 1.01 and GFR >60  3: DM- - A1c 11/29/2018 was 8.3% - glucose at the group home was 108  4: Schizophrenia- - living at a group home called [facility] - patient cooperative but is confused about details of health  5: COPD- - saw pulmonology Ashby Dawes) 11/29/17 - continues to smoke 1 ppd / cigarettes - complete cessation discussed for 3 minutes with him  6: Lymphedema- - stage 2 - not elevating his legs much and he was encouraged to elevate them when sitting for long periods of time - caregiver says that compression socks have been ordered and they are waiting for them to  arrive - limited in his ability to exercise due to his shortness of breath - consider lymphapress compression boots if edema persists  Facility medication list was reviewed.  Return in 2 days for a recheck of symptoms. Caregiver unable to bring patient back tomorrow.

## 2019-07-09 NOTE — Patient Instructions (Addendum)
Start weighing daily and call for an overnight weight gain of > 2 pounds or a weekly weight gain of >5 pounds.  

## 2019-07-11 ENCOUNTER — Encounter: Payer: Self-pay | Admitting: Family

## 2019-07-11 ENCOUNTER — Other Ambulatory Visit: Payer: Self-pay

## 2019-07-11 ENCOUNTER — Other Ambulatory Visit: Payer: Self-pay | Admitting: Family

## 2019-07-11 ENCOUNTER — Ambulatory Visit: Payer: Medicare Other | Admitting: Family

## 2019-07-11 ENCOUNTER — Ambulatory Visit
Admission: RE | Admit: 2019-07-11 | Discharge: 2019-07-11 | Disposition: A | Discharge status: Home / Self Care | Payer: Medicare Other | Source: Ambulatory Visit | Attending: Family | Admitting: Family

## 2019-07-11 VITALS — BP 104/93 | HR 102 | Resp 18 | Ht 72.0 in | Wt 247.1 lb

## 2019-07-11 DIAGNOSIS — I890 Lymphedema, not elsewhere classified: Secondary | ICD-10-CM

## 2019-07-11 DIAGNOSIS — I10 Essential (primary) hypertension: Secondary | ICD-10-CM

## 2019-07-11 DIAGNOSIS — Z7984 Long term (current) use of oral hypoglycemic drugs: Secondary | ICD-10-CM | POA: Insufficient documentation

## 2019-07-11 DIAGNOSIS — E119 Type 2 diabetes mellitus without complications: Secondary | ICD-10-CM

## 2019-07-11 DIAGNOSIS — I5023 Acute on chronic systolic (congestive) heart failure: Secondary | ICD-10-CM

## 2019-07-11 DIAGNOSIS — Z886 Allergy status to analgesic agent status: Secondary | ICD-10-CM | POA: Insufficient documentation

## 2019-07-11 DIAGNOSIS — Z8249 Family history of ischemic heart disease and other diseases of the circulatory system: Secondary | ICD-10-CM | POA: Insufficient documentation

## 2019-07-11 DIAGNOSIS — Z79899 Other long term (current) drug therapy: Secondary | ICD-10-CM | POA: Insufficient documentation

## 2019-07-11 DIAGNOSIS — F203 Undifferentiated schizophrenia: Secondary | ICD-10-CM

## 2019-07-11 DIAGNOSIS — F209 Schizophrenia, unspecified: Secondary | ICD-10-CM | POA: Insufficient documentation

## 2019-07-11 DIAGNOSIS — J449 Chronic obstructive pulmonary disease, unspecified: Secondary | ICD-10-CM | POA: Insufficient documentation

## 2019-07-11 DIAGNOSIS — J42 Unspecified chronic bronchitis: Secondary | ICD-10-CM

## 2019-07-11 DIAGNOSIS — I110 Hypertensive heart disease with heart failure: Secondary | ICD-10-CM | POA: Insufficient documentation

## 2019-07-11 DIAGNOSIS — F17210 Nicotine dependence, cigarettes, uncomplicated: Secondary | ICD-10-CM | POA: Insufficient documentation

## 2019-07-11 LAB — BASIC METABOLIC PANEL
Anion gap: 10 (ref 5–15)
BUN: 16 mg/dL (ref 6–20)
CO2: 30 mmol/L (ref 22–32)
Calcium: 8.3 mg/dL — ABNORMAL LOW (ref 8.9–10.3)
Chloride: 98 mmol/L (ref 98–111)
Creatinine, Ser: 1.11 mg/dL (ref 0.61–1.24)
GFR calc Af Amer: >60 mL/min (ref >60)
GFR calc non Af Amer: >60 mL/min (ref >60)
Glucose, Bld: 240 mg/dL — ABNORMAL HIGH (ref 70–99)
Potassium: 3.9 mmol/L (ref 3.5–5.1)
Sodium: 138 mmol/L (ref 135–145)

## 2019-07-11 LAB — BRAIN NATRIURETIC PEPTIDE: B Natriuretic Peptide: 1326 pg/mL — ABNORMAL HIGH (ref 0.0–100.0)

## 2019-07-11 MED ORDER — POTASSIUM CHLORIDE CRYS ER 20 MEQ PO TBCR
EXTENDED_RELEASE_TABLET | ORAL | Status: AC
  Filled 2019-07-11: qty 2

## 2019-07-11 MED ORDER — SODIUM CHLORIDE FLUSH 0.9 % IV SOLN
INTRAVENOUS | Status: AC
  Filled 2019-07-11: qty 20

## 2019-07-11 MED ORDER — POTASSIUM CHLORIDE CRYS ER 20 MEQ PO TBCR
40.0000 meq | EXTENDED_RELEASE_TABLET | Freq: Once | ORAL | Status: AC
  Administered 2019-07-11: 11:00:00 40 meq via ORAL

## 2019-07-11 MED ORDER — FUROSEMIDE 10 MG/ML IJ SOLN
INTRAMUSCULAR | Status: AC
  Filled 2019-07-11: qty 8

## 2019-07-11 MED ORDER — FUROSEMIDE 10 MG/ML IJ SOLN
80.0000 mg | Freq: Once | INTRAMUSCULAR | Status: AC
  Administered 2019-07-11: 80 mg via INTRAVENOUS

## 2019-07-11 NOTE — Progress Notes (Signed)
I wanted to check with Paul Hernandez if it was ok to still give Lasix and send patient home. Per Paul Hernandez ok to send home.   When patient arrived to [place] he was very labored and unable to say a complete sentence. This did improve with rest. Patients heart rate was irregular. Patients SPO2 was 100% on room air but placed on 2L nasal cannula for support.

## 2019-07-11 NOTE — Patient Instructions (Signed)
Continue weighing daily and call for an overnight weight gain of > 2 pounds or a weekly weight gain of >5 pounds. 

## 2019-07-13 NOTE — Progress Notes (Signed)
Patient ID: Paul Hernandez, male    DOB: Feb 23, 1966, 53 y.o.   MRN: [id number]  HPI  [name] is a 53 y/o male with a history of HTN, DM, COPD, schizophrenia, current tobacco use and chronic heart failure.   Echo report from 11/30/2018 reviewed and showed an EF of 20-25%.  Was in the ED 07/08/2019 due to COPD/HF exacerbation. Prescribed lasix and prednisone and released.   He presents today for a follow-up visit with a chief complaint of minimal shortness of breath on moderate exertion. A[place] medical mall [place] our office with some shortness of breath. He has associated pedal edema along with this although feels like it's much better. He denies any difficulty sleeping, dizziness, abdominal distention, palpitations, chest pain, cough, fatigue or weight gain.   Received 80mg  IV lasix/ 40meq PO potassium twice last week.   Past Medical History:  Diagnosis Date  . CHF (congestive heart failure) (HCC)   . COPD (chronic obstructive pulmonary disease) (HCC)   . Diabetes mellitus without complication (HCC)   . Hypertension   . Mental health disorder    Past Surgical History:  Procedure Laterality Date  . NO PAST SURGERIES     Family History  Problem Relation Age of Onset  . CVA Mother   . CAD Father    Social History   Tobacco Use  . Smoking status: Current Some Day Smoker    Packs/day: 1.00    Years: 35.00    Pack years: 35.00    Types: Cigarettes  . Smokeless tobacco: Never Used  Substance Use Topics  . Alcohol use: No   Allergies  Allergen Reactions  . Asa [Aspirin]    Prior to Admission medications   Medication Sig Start Date End Date Taking? Authorizing Provider  albuterol (VENTOLIN HFA) 108 (90 Base) MCG/ACT inhaler Inhale 2 puffs into the lungs every 6 (six) hours as needed for wheezing or shortness of breath. 07/08/19  Yes Chesley NoonJessup, Charles, MD  amLODipine (NORVASC) 5 MG tablet Take 10 mg by mouth daily.   Yes [provider]  benztropine  (COGENTIN) 2 MG tablet Take 2 mg by mouth at bedtime.   Yes [provider]  carvedilol (COREG) 25 MG tablet Take 1 tablet (25 mg total) by mouth 2 (two) times daily with a meal. 11/16/17  Yes Enedina FinnerPatel, Sona, MD  Cholecalciferol (VITAMIN D3) 5000 units CAPS Take 5,000 Units by mouth daily.   Yes [provider]  dapagliflozin propanediol (FARXIGA) 5 MG TABS tablet Take 5 mg by mouth daily.   Yes [provider]  dextrose (GLUTOSE) 40 % GEL Take by mouth once as needed for low blood sugar. For blood sugar <70   Yes [provider]  diphenhydrAMINE (BENADRYL) 25 mg capsule Take 25 mg by mouth at bedtime.    Yes [provider]  ezetimibe (ZETIA) 10 MG tablet Take 10 mg by mouth daily.   Yes [provider]  fluticasone (FLONASE) 50 MCG/ACT nasal spray Place 1 spray into both nostrils daily.   Yes [provider]  Fluticasone-Umeclidin-Vilant (TRELEGY ELLIPTA) 100-62.5-25 MCG/INH AEPB Inhale 1 puff into the lungs.   Yes [provider]  furosemide (LASIX) 20 MG tablet Take 1 tablet (20 mg total) by mouth 2 (two) times daily. Patient taking differently: Take 40 mg by mouth daily.  07/08/19 08/07/19 Yes Chesley NoonJessup, Charles, MD  guaifenesin (ROBITUSSIN) 100 MG/5ML syrup Take 200 mg by mouth every 4 (four) hours as needed for  cough.   Yes [provider]  haloperidol decanoate (HALDOL DECANOATE) 100 MG/ML injection Inject 90 mg into the muscle every 28 (twenty-eight) days.    Yes [provider]  isosorbide-hydrALAZINE (BIDIL) 20-37.5 MG tablet Take 1 tablet by mouth 3 (three) times daily.   Yes [provider]  metFORMIN (GLUCOPHAGE) 500 MG tablet Take 1,000 mg by mouth 2 (two) times daily with a meal.   Yes [provider]  rosuvastatin (CRESTOR) 40 MG tablet Take 40 mg by mouth daily.   Yes [provider]  sacubitril-valsartan (ENTRESTO) 24-26 MG Take 1 tablet by mouth 2 (two) times daily. 07/09/19   Yes Delma Freeze, FNP     Review of Systems  Constitutional: Negative for appetite change, fatigue and fever.  HENT: Negative for congestion, rhinorrhea and sore throat.   Eyes: Negative.   Respiratory: Positive for shortness of breath (much better). Negative for cough and chest tightness.   Cardiovascular: Positive for leg swelling ("better"). Negative for chest pain and palpitations.  Gastrointestinal: Negative for abdominal distention and abdominal pain.  Endocrine: Negative.   Musculoskeletal: Negative for arthralgias and back pain.  Skin: Negative for rash and wound.  Allergic/Immunologic: Negative.   Neurological: Negative for dizziness and light-headedness.  Hematological: Negative for adenopathy. Does not bruise/bleed easily.  Psychiatric/Behavioral: Negative for dysphoric mood and sleep disturbance (sleeps with 2 pillows). The patient is not nervous/anxious.    Vitals:   07/14/19 0953  BP: 122/73  Pulse: (!) 109  Resp: 18  SpO2: 95%  Weight: 244 lb (110.7 kg)  Height: 6' (1.829 m)   Wt Readings from Last 3 Encounters:  07/14/19 244 lb (110.7 kg)  07/11/19 247 lb 2 oz (112.1 kg)  07/09/19 262 lb 2 oz (118.9 kg)   Lab Results  Component Value Date   CREATININE 1.11 07/11/2019   CREATININE 1.00 07/09/2019   CREATININE 1.01 07/08/2019    Physical Exam Constitutional:      Appearance: Normal appearance.  HENT:     Head: Normocephalic and atraumatic.  Neck:     Musculoskeletal: Normal range of motion and neck supple.  Cardiovascular:     Rate and Rhythm: Regular rhythm. Tachycardia present.  Pulmonary:     Breath sounds: Wheezing present. No rales.  Abdominal:     General: There is no distension.     Palpations: Abdomen is soft.  Musculoskeletal:        General: No tenderness.     Right lower leg: Edema (2+ pitting up to knee) present.     Left lower leg: Edema (2+ pitting up to knee) present.  Skin:    General: Skin is warm and dry.  Neurological:      General: No focal deficit present.     Mental Status: He is alert and oriented to person, place, and time.  Psychiatric:        Mood and Affect: Mood normal.        Behavior: Behavior normal.    Assessment & Plan:  1: Chronic heart failure with reduced ejection fraction- - NYHA class II - euvolemic today although continues with pedal edema - weighing daily and reminded to call for an overnight weight gain of >2 pounds or a weekly weight gain of >5 pounds - weight down 3 pounds from last visit here 3 days ago - will increase his entresto to 49/51mg  BID - check BMP at next visit - not adding salt to his food - drinking "a lot" of  water but he is unable to quantify amount - should he remain tachycardic, can try corlanor - consider farxiga at future visits - saw cardiology Clayborn Bigness) 05/01/18 - BNP 07/11/2019 was 1326.0  2: HTN- - BP looks good today - may be able to stop amlodipine in the future - BMP on 07/11/2019 reviewed and showed sodium 138, potassium 3.9, creatinine 1.11 and GFR >60  3: DM- - A1c 11/29/2018 was 8.3%  4: Schizophrenia- - living at a group home called [facility] - patient cooperative but is confused about details of health  5: COPD- - saw pulmonology Ashby Dawes) 11/29/17 - continues to smoke cigarettes but is smoking less because the caregiver says that he hasn't bought any for him recently - complete cessation discussed for 3 minutes with him  6: Lymphedema- - stage 2 - not elevating his legs much and he was encouraged to elevate them when sitting for long periods of time - does have the compression socks and has been wearing them although doesn't have them on today because he washed them and they are drying; edema has improved - trying to do more walking - consider lymphapress compression boots if edema persists  Facility medication list was reviewed.  Return in 2 weeks or sooner for any questions/problems before then.

## 2019-07-14 ENCOUNTER — Encounter: Payer: Self-pay | Admitting: Family

## 2019-07-14 ENCOUNTER — Other Ambulatory Visit: Payer: Self-pay

## 2019-07-14 ENCOUNTER — Ambulatory Visit: Payer: Medicare Other | Attending: Family | Admitting: Family

## 2019-07-14 VITALS — BP 122/73 | HR 109 | Resp 18 | Ht 72.0 in | Wt 244.0 lb

## 2019-07-14 DIAGNOSIS — R0602 Shortness of breath: Secondary | ICD-10-CM | POA: Insufficient documentation

## 2019-07-14 DIAGNOSIS — I5022 Chronic systolic (congestive) heart failure: Secondary | ICD-10-CM | POA: Diagnosis not present

## 2019-07-14 DIAGNOSIS — J42 Unspecified chronic bronchitis: Secondary | ICD-10-CM

## 2019-07-14 DIAGNOSIS — E119 Type 2 diabetes mellitus without complications: Secondary | ICD-10-CM | POA: Diagnosis not present

## 2019-07-14 DIAGNOSIS — Z7984 Long term (current) use of oral hypoglycemic drugs: Secondary | ICD-10-CM | POA: Diagnosis not present

## 2019-07-14 DIAGNOSIS — F17210 Nicotine dependence, cigarettes, uncomplicated: Secondary | ICD-10-CM | POA: Insufficient documentation

## 2019-07-14 DIAGNOSIS — I890 Lymphedema, not elsewhere classified: Secondary | ICD-10-CM | POA: Insufficient documentation

## 2019-07-14 DIAGNOSIS — J449 Chronic obstructive pulmonary disease, unspecified: Secondary | ICD-10-CM | POA: Diagnosis not present

## 2019-07-14 DIAGNOSIS — I110 Hypertensive heart disease with heart failure: Secondary | ICD-10-CM | POA: Insufficient documentation

## 2019-07-14 DIAGNOSIS — Z8249 Family history of ischemic heart disease and other diseases of the circulatory system: Secondary | ICD-10-CM | POA: Diagnosis not present

## 2019-07-14 DIAGNOSIS — I10 Essential (primary) hypertension: Secondary | ICD-10-CM

## 2019-07-14 DIAGNOSIS — F209 Schizophrenia, unspecified: Secondary | ICD-10-CM | POA: Diagnosis not present

## 2019-07-14 DIAGNOSIS — Z79899 Other long term (current) drug therapy: Secondary | ICD-10-CM | POA: Diagnosis not present

## 2019-07-14 DIAGNOSIS — F203 Undifferentiated schizophrenia: Secondary | ICD-10-CM

## 2019-07-14 MED ORDER — SACUBITRIL-VALSARTAN 49-51 MG PO TABS: 1.0000 | ORAL_TABLET | Freq: Two times a day (BID) | ORAL | 5 refills | Status: DC

## 2019-07-14 NOTE — Patient Instructions (Signed)
Continue weighing daily and call for an overnight weight gain of > 2 pounds or a weekly weight gain of >5 pounds. 

## 2019-07-30 NOTE — Progress Notes (Signed)
Patient ID: Paul Hernandez, male    DOB: 13-May-1966, 53 y.o.   MRN: [id number]  HPI  Paul Hernandez is a 53 y/o male with a history of HTN, DM, COPD, schizophrenia, current tobacco use and chronic heart failure.   Echo report from 11/30/2018 reviewed and showed an EF of 20-25%.  Was in the ED 07/08/2019 due to COPD/HF exacerbation. Prescribed lasix and prednisone and released.   He presents today for a follow-up visit with a chief complaint of moderate shortness of breath upon minimal exertion. This has been chronic for several months and transportation person with him says it seems worse to him than previously. Has associated pedal edema and says that he's "getting fat". He denies any difficulty sleeping, dizziness, abdominal distention, palpitations, chest pain, cough or fatigue.   He says that he's weighing daily at the group home and a staff member is witting the weight down. He smoked 1 cigarette today but hadn't smoked anything in the last 2 weeks until today. Is drinking ~ 1L of tea daily and 1 water bottle daily.   Past Medical History:  Diagnosis Date  . CHF (congestive heart failure) (HCC)   . COPD (chronic obstructive pulmonary disease) (HCC)   . Diabetes mellitus without complication (HCC)   . Hypertension   . Mental health disorder    Past Surgical History:  Procedure Laterality Date  . NO PAST SURGERIES     Family History  Problem Relation Age of Onset  . CVA Mother   . CAD Father    Social History   Tobacco Use  . Smoking status: Current Some Day Smoker    Packs/day: 1.00    Years: 35.00    Pack years: 35.00    Types: Cigarettes  . Smokeless tobacco: Never Used  Substance Use Topics  . Alcohol use: No   Allergies  Allergen Reactions  . Asa [Aspirin]    Prior to Admission medications   Medication Sig Start Date End Date Taking? Authorizing Provider  albuterol (VENTOLIN HFA) 108 (90 Base) MCG/ACT inhaler Inhale 2 puffs into the lungs every 6 (six) hours as  needed for wheezing or shortness of breath. 07/08/19  Yes Chesley Noon, MD  amLODipine (NORVASC) 5 MG tablet Take 10 mg by mouth daily.   Yes [provider]  benztropine (COGENTIN) 2 MG tablet Take 2 mg by mouth at bedtime.   Yes [provider]  carvedilol (COREG) 25 MG tablet Take 1 tablet (25 mg total) by mouth 2 (two) times daily with a meal. 11/16/17  Yes Enedina Finner, MD  Cholecalciferol (VITAMIN D3) 5000 units CAPS Take 5,000 Units by mouth daily.   Yes [provider]  dapagliflozin propanediol (FARXIGA) 5 MG TABS tablet Take 5 mg by mouth daily.   Yes [provider]  dextrose (GLUTOSE) 40 % GEL Take by mouth once as needed for low blood sugar. For blood sugar <70   Yes [provider]  diphenhydrAMINE (BENADRYL) 25 mg capsule Take 25 mg by mouth at bedtime.    Yes [provider]  ezetimibe (ZETIA) 10 MG tablet Take 10 mg by mouth daily.   Yes [provider]  fluticasone (FLONASE) 50 MCG/ACT nasal spray Place 1 spray into both nostrils daily.   Yes [provider]  Fluticasone-Umeclidin-Vilant (TRELEGY ELLIPTA) 100-62.5-25 MCG/INH AEPB Inhale 1 puff into the lungs.   Yes [provider]  furosemide (LASIX) 20 MG tablet Take 1 tablet (20 mg total) by mouth 2 (two)  times daily. Patient taking differently: Take 40 mg by mouth daily.  07/08/19 08/07/19 Yes Blake Divine, MD  guaifenesin (ROBITUSSIN) 100 MG/5ML syrup Take 200 mg by mouth every 4 (four) hours as needed for cough.   Yes [provider]  haloperidol decanoate (HALDOL DECANOATE) 100 MG/ML injection Inject 90 mg into the muscle every 28 (twenty-eight) days.    Yes [provider]  isosorbide-hydrALAZINE (BIDIL) 20-37.5 MG tablet Take 1 tablet by mouth 3 (three) times daily.   Yes [provider]  metFORMIN (GLUCOPHAGE) 500 MG tablet Take 1,000 mg by mouth 2 (two) times daily with a meal.   Yes [provider]   rosuvastatin (CRESTOR) 40 MG tablet Take 40 mg by mouth daily.   Yes [provider]  sacubitril-valsartan (ENTRESTO) 49-51 MG Take 1 tablet by mouth 2 (two) times daily. 07/14/19  Yes [name], FNP     Review of Systems  Constitutional: Negative for appetite change, fatigue and fever.  HENT: Negative for congestion, rhinorrhea and sore throat.   Eyes: Negative.   Respiratory: Positive for shortness of breath (moderate). Negative for cough and chest tightness.   Cardiovascular: Positive for leg swelling (worsening). Negative for chest pain and palpitations.  Gastrointestinal: Negative for abdominal distention and abdominal pain.  Endocrine: Negative.   Musculoskeletal: Negative for arthralgias and back pain.  Skin: Negative for rash and wound.  Allergic/Immunologic: Negative.   Neurological: Negative for dizziness and light-headedness.  Hematological: Negative for adenopathy. Does not bruise/bleed easily.  Psychiatric/Behavioral: Negative for dysphoric mood and sleep disturbance (sleeps with 2 pillows). The patient is not nervous/anxious.    Vitals:   07/31/19 0946 07/31/19 1004  BP: (!) 161/121 (!) 138/100  Pulse: (!) 108   Resp: 18   SpO2: 99%   Weight: 263 lb 6 oz (119.5 kg)    Wt Readings from Last 3 Encounters:  07/31/19 263 lb 6 oz (119.5 kg)  07/14/19 244 lb (110.7 kg)  07/11/19 247 lb 2 oz (112.1 kg)   Lab Results  Component Value Date   CREATININE 1.11 07/11/2019   CREATININE 1.00 07/09/2019   CREATININE 1.01 07/08/2019     Physical Exam Constitutional:      Appearance: Normal appearance.  HENT:     Head: Normocephalic and atraumatic.  Neck:     Musculoskeletal: Normal range of motion and neck supple.  Cardiovascular:     Rate and Rhythm: Regular rhythm. Tachycardia present.  Pulmonary:     Effort: Pulmonary effort is normal.     Breath sounds: Wheezing present. No rales.  Abdominal:     General: There is no distension.     Palpations:  Abdomen is soft.  Musculoskeletal:        General: No tenderness.     Right lower leg: Edema (3+ pitting up to knee) present.     Left lower leg: Edema (3+ pitting up to knee) present.  Skin:    General: Skin is warm and dry.  Neurological:     General: No focal deficit present.     Mental Status: He is alert and oriented to person, place, and time.  Psychiatric:        Mood and Affect: Mood normal.        Behavior: Behavior normal.    Assessment & Plan:  1: Acute on Chronic heart failure with reduced ejection fraction- - NYHA class III - moderately fluid overloaded today with increased weight and edema - weighing daily and reminded to  call for an overnight weight gain of >2 pounds or a weekly weight gain of >5 pounds - weight up 19.6 pounds from last visit here 2 1/2 weeks ago - entresto increased to 49/51mg  BID at his last visit - will send for 80mg  IV lasix/ 40meq PO potassium today - BMP & BNP to be drawn today - not adding salt to his food - drinking ~ 1L of tea and 1 bottle of water daily - should he remain tachycardic, can try corlanor - on farxiga but only the 5mg  dose - saw cardiology Paul Pares([facility]) 05/01/18 - BNP 07/11/2019 was 1326.0  2: HTN- - BP elevated (161/121) but had improved upon recheck with a manual cuff (138/100) - BMP on 07/11/2019 reviewed and showed sodium 138, potassium 3.9, creatinine 1.11 and GFR >60  3: DM- - A1c 11/29/2018 was 8.3%  4: Schizophrenia- - living at a group home called [facility] - patient cooperative but is confused about details of health  5: COPD- - saw pulmonology Paul Johns([facility]) 11/29/17 - smoked 1 cigarette today but hadn't smoked anything in the last 2 weeks until today - complete cessation discussed for 3 minutes with him  6: Lymphedema- - stage 2 - not elevating his legs much and he was encouraged to elevate them when sitting for long periods of time - tried wearing the compression socks but they hurt/ cut into his  legs - trying to do more walking - consider lymphapress compression boots if edema persists  Facility medication list was reviewed.  Return tomorrow to reevaluate effectiveness of IV lasix.  Spoke with group home director on the phone who wanted patient admitted because they couldn't bring him back tomorrow as he "wasn't their only resident and took up too much of their time". Explained that he did not need to be admitted and that we could treat this with IV lasix as we had done previously.   Emphasized that patient had gained 19.6 pounds in the last 2 1/2 weeks at the group home without anyone calling the HF Clinic. Order written again about what parameters the group home needs to call us about. Transportation person with patient says that he will make sure that patient gets to the appointment with us tomorrow and the director also says that patient can return tomorrow.

## 2019-07-31 ENCOUNTER — Other Ambulatory Visit: Payer: Self-pay | Admitting: Family

## 2019-07-31 ENCOUNTER — Ambulatory Visit: Payer: Medicare Other | Admitting: Family

## 2019-07-31 ENCOUNTER — Ambulatory Visit
Admission: RE | Admit: 2019-07-31 | Discharge: 2019-07-31 | Disposition: A | Discharge status: Home / Self Care | Payer: Medicare Other | Source: Ambulatory Visit | Attending: Family | Admitting: Family

## 2019-07-31 ENCOUNTER — Encounter: Payer: Self-pay | Admitting: Family

## 2019-07-31 ENCOUNTER — Other Ambulatory Visit: Payer: Self-pay

## 2019-07-31 VITALS — BP 138/100 | HR 108 | Resp 18 | Wt 263.4 lb

## 2019-07-31 DIAGNOSIS — J449 Chronic obstructive pulmonary disease, unspecified: Secondary | ICD-10-CM | POA: Diagnosis not present

## 2019-07-31 DIAGNOSIS — I5023 Acute on chronic systolic (congestive) heart failure: Secondary | ICD-10-CM

## 2019-07-31 DIAGNOSIS — F203 Undifferentiated schizophrenia: Secondary | ICD-10-CM

## 2019-07-31 DIAGNOSIS — I110 Hypertensive heart disease with heart failure: Secondary | ICD-10-CM | POA: Insufficient documentation

## 2019-07-31 DIAGNOSIS — F209 Schizophrenia, unspecified: Secondary | ICD-10-CM | POA: Insufficient documentation

## 2019-07-31 DIAGNOSIS — E119 Type 2 diabetes mellitus without complications: Secondary | ICD-10-CM | POA: Diagnosis not present

## 2019-07-31 DIAGNOSIS — Z7984 Long term (current) use of oral hypoglycemic drugs: Secondary | ICD-10-CM | POA: Insufficient documentation

## 2019-07-31 DIAGNOSIS — Z8249 Family history of ischemic heart disease and other diseases of the circulatory system: Secondary | ICD-10-CM | POA: Diagnosis not present

## 2019-07-31 DIAGNOSIS — Z886 Allergy status to analgesic agent status: Secondary | ICD-10-CM | POA: Diagnosis not present

## 2019-07-31 DIAGNOSIS — F17210 Nicotine dependence, cigarettes, uncomplicated: Secondary | ICD-10-CM | POA: Diagnosis not present

## 2019-07-31 DIAGNOSIS — I890 Lymphedema, not elsewhere classified: Secondary | ICD-10-CM | POA: Diagnosis not present

## 2019-07-31 DIAGNOSIS — Z79899 Other long term (current) drug therapy: Secondary | ICD-10-CM | POA: Insufficient documentation

## 2019-07-31 DIAGNOSIS — Z823 Family history of stroke: Secondary | ICD-10-CM | POA: Insufficient documentation

## 2019-07-31 DIAGNOSIS — I10 Essential (primary) hypertension: Secondary | ICD-10-CM

## 2019-07-31 DIAGNOSIS — J42 Unspecified chronic bronchitis: Secondary | ICD-10-CM

## 2019-07-31 LAB — BASIC METABOLIC PANEL
Anion gap: 7 (ref 5–15)
BUN: 16 mg/dL (ref 6–20)
CO2: 30 mmol/L (ref 22–32)
Calcium: 8.4 mg/dL — ABNORMAL LOW (ref 8.9–10.3)
Chloride: 102 mmol/L (ref 98–111)
Creatinine, Ser: 1.02 mg/dL (ref 0.61–1.24)
GFR calc Af Amer: >60 mL/min (ref >60)
GFR calc non Af Amer: >60 mL/min (ref >60)
Glucose, Bld: 241 mg/dL — ABNORMAL HIGH (ref 70–99)
Potassium: 4.5 mmol/L (ref 3.5–5.1)
Sodium: 139 mmol/L (ref 135–145)

## 2019-07-31 LAB — BRAIN NATRIURETIC PEPTIDE: B Natriuretic Peptide: 1561 pg/mL — ABNORMAL HIGH (ref 0.0–100.0)

## 2019-07-31 MED ORDER — POTASSIUM CHLORIDE CRYS ER 20 MEQ PO TBCR
40.0000 meq | EXTENDED_RELEASE_TABLET | Freq: Once | ORAL | Status: AC
  Administered 2019-07-31: 40 meq via ORAL

## 2019-07-31 MED ORDER — FUROSEMIDE 10 MG/ML IJ SOLN
80.0000 mg | Freq: Once | INTRAMUSCULAR | Status: AC
  Administered 2019-07-31: 11:00:00 80 mg via INTRAVENOUS

## 2019-07-31 NOTE — Patient Instructions (Signed)
Continue weighing daily and call for an overnight weight gain of > 2 pounds or a weekly weight gain of >5 pounds. 

## 2019-07-31 NOTE — Progress Notes (Signed)
Patient ID: Paul Hernandez, male    DOB: 26-Mar-1966, 53 y.o.   MRN: [id number]  HPI  Paul Hernandez is a 52 y/o male with a history of HTN, DM, COPD, schizophrenia, current tobacco use and chronic heart failure.   Echo report from 11/30/2018 reviewed and showed an EF of 20-25%.  Was in the ED 07/08/2019 due to COPD/HF exacerbation. Prescribed lasix and prednisone and released.   He presents today for a follow-up visit with a chief complaint of moderate shortness of breath upon minimal exertion. He has continued associated pedal edema along with this. He denies any difficulty sleeping, dizziness, abdominal distention, palpitations, chest pain, cough, fatigue or weight gain. Says that staff is weighing him and writing the weight down.   Received 80mg  IV lasix and PO potassium yesterday.  Past Medical History:  Diagnosis Date  . CHF (congestive heart failure) (HCC)   . COPD (chronic obstructive pulmonary disease) (HCC)   . Diabetes mellitus without complication (HCC)   . Hypertension   . Mental health disorder    Past Surgical History:  Procedure Laterality Date  . NO PAST SURGERIES     Family History  Problem Relation Age of Onset  . CVA Mother   . CAD Father    Social History   Tobacco Use  . Smoking status: Current Some Day Smoker    Packs/day: 1.00    Years: 35.00    Pack years: 35.00    Types: Cigarettes  . Smokeless tobacco: Never Used  Substance Use Topics  . Alcohol use: No   Allergies  Allergen Reactions  . Asa [Aspirin]    Prior to Admission medications   Medication Sig Start Date End Date Taking? Authorizing Provider  albuterol (VENTOLIN HFA) 108 (90 Base) MCG/ACT inhaler Inhale 2 puffs into the lungs every 6 (six) hours as needed for wheezing or shortness of breath. 07/08/19  Yes 07/10/19, MD  amLODipine (NORVASC) 5 MG tablet Take 10 mg by mouth daily.   Yes [provider]  benztropine (COGENTIN) 2 MG tablet Take 2 mg by mouth at bedtime.    Yes [provider]  carvedilol (COREG) 25 MG tablet Take 1 tablet (25 mg total) by mouth 2 (two) times daily with a meal. 11/16/17  Yes 11/18/17, MD  Cholecalciferol (VITAMIN D3) 5000 units CAPS Take 5,000 Units by mouth daily.   Yes [provider]  dapagliflozin propanediol (FARXIGA) 5 MG TABS tablet Take 5 mg by mouth daily.   Yes [provider]  dextrose (GLUTOSE) 40 % GEL Take by mouth once as needed for low blood sugar. For blood sugar <70   Yes [provider]  diphenhydrAMINE (BENADRYL) 25 mg capsule Take 25 mg by mouth at bedtime.    Yes [provider]  ezetimibe (ZETIA) 10 MG tablet Take 10 mg by mouth daily.   Yes [provider]  fluticasone (FLONASE) 50 MCG/ACT nasal spray Place 1 spray into both nostrils daily.   Yes [provider]  Fluticasone-Umeclidin-Vilant (TRELEGY ELLIPTA) 100-62.5-25 MCG/INH AEPB Inhale 1 puff into the lungs.   Yes [provider]  furosemide (LASIX) 20 MG tablet Take 1 tablet (20 mg total) by mouth 2 (two) times daily. Patient not taking: Reported on 08/01/2019 07/08/19 08/07/19 Yes 08/09/19, MD  guaifenesin (ROBITUSSIN) 100 MG/5ML syrup Take 200 mg by mouth every 4 (four) hours as needed for cough.   Yes [provider]  haloperidol decanoate (HALDOL DECANOATE) 100 MG/ML injection  Inject 90 mg into the muscle every 28 (twenty-eight) days.    Yes [provider]  isosorbide-hydrALAZINE (BIDIL) 20-37.5 MG tablet Take 1 tablet by mouth 3 (three) times daily.   Yes [provider]  metFORMIN (GLUCOPHAGE) 500 MG tablet Take 1,000 mg by mouth 2 (two) times daily with a meal.   Yes [provider]  rosuvastatin (CRESTOR) 40 MG tablet Take 40 mg by mouth daily.   Yes [provider]  sacubitril-valsartan (ENTRESTO) 49-51 MG Take 1 tablet by mouth 2 (two) times daily. 07/14/19  Yes [name], FNP     Review of Systems  Constitutional:  Negative for appetite change, fatigue and fever.  HENT: Negative for congestion, rhinorrhea and sore throat.   Eyes: Negative.   Respiratory: Positive for shortness of breath (moderate). Negative for cough and chest tightness.   Cardiovascular: Positive for leg swelling (no change). Negative for chest pain and palpitations.  Gastrointestinal: Negative for abdominal distention and abdominal pain.  Endocrine: Negative.   Musculoskeletal: Negative for arthralgias and back pain.  Skin: Negative for rash and wound.  Allergic/Immunologic: Negative.   Neurological: Negative for dizziness and light-headedness.  Hematological: Negative for adenopathy. Does not bruise/bleed easily.  Psychiatric/Behavioral: Negative for dysphoric mood and sleep disturbance (sleeps with 2 pillows). The patient is not nervous/anxious.    Vitals:   08/01/19 0918  BP: (!) 156/130  Pulse: (!) 107  Resp: 18  SpO2: 98%  Weight: 259 lb 8 oz (117.7 kg)   Wt Readings from Last 3 Encounters:  08/01/19 259 lb 8 oz (117.7 kg)  07/31/19 263 lb 6 oz (119.5 kg)  07/14/19 244 lb (110.7 kg)   Lab Results  Component Value Date   CREATININE 1.02 07/31/2019   CREATININE 1.11 07/11/2019   CREATININE 1.00 07/09/2019     Physical Exam Constitutional:      Appearance: Normal appearance.  HENT:     Head: Normocephalic and atraumatic.  Neck:     Musculoskeletal: Normal range of motion and neck supple.  Cardiovascular:     Rate and Rhythm: Regular rhythm. Tachycardia present.  Pulmonary:     Effort: Pulmonary effort is normal.     Breath sounds: Wheezing (upper lobes) present. No rales.  Abdominal:     General: There is no distension.     Palpations: Abdomen is soft.  Musculoskeletal:        General: No tenderness.     Right lower leg: Edema (3+ pitting up to knee) present.     Left lower leg: Edema (3+ pitting up to knee) present.  Skin:    General: Skin is warm and dry.  Neurological:     General: No focal  deficit present.     Mental Status: He is alert and oriented to person, place, and time.  Psychiatric:        Mood and Affect: Mood normal.        Behavior: Behavior normal.    Assessment & Plan:  1: Acute on Chronic heart failure with reduced ejection fraction- - NYHA class III - moderately fluid overloaded today with continued edema along with elevated BNP - weighing daily and reminded to call for an overnight weight gain of >2 pounds or a weekly weight gain of >5 pounds - weight down 4 pounds from last visit here yesterday - received 80mg  IV lasix/ 51meq PO potassium yesterday - will send back today for additional 80mg  IV lasix/ 85meq PO potassium - BMP/BNP to be drawn  today - not adding salt to his food - drinking ~ 1L of tea and 1 bottle of water daily - should he remain tachycardic, can try corlanor - on farxiga but only the 5mg  dose - saw cardiology Juliann Pares([facility]) 05/01/18 - BNP 07/31/2019 was 1561.0  2: HTN- - BP elevated today; giving IV lasix today - BMP on 07/31/2019 reviewed and showed sodium 139, potassium 4.5, creatinine 1.02 and GFR >60  3: DM- - A1c 11/29/2018 was 8.3%  4: Schizophrenia- - living at a group home called [facility] - patient cooperative but is confused about details of health  5: COPD- - saw pulmonology Nicholos Johns([facility]) 11/29/17 - hasn't smoked any cigarettes since yesterday - complete cessation discussed for 3 minutes with him  6: Lymphedema- - stage 2 - not elevating his legs much and he was encouraged to elevate them when sitting for long periods of time - tried wearing the compression socks but they hurt/ cut into his legs - trying to do more walking - consider lymphapress compression boots if edema persists  Facility medication list was reviewed.  Due to upcoming weekend, return in 3 days.

## 2019-07-31 NOTE — Progress Notes (Signed)
Mr. Lindblad to room 6 via wheelchair from the heart failure clinic; patient hypertensive, tachypneic, and tachycardic on arrival. Patient states "im always like this".  Called to speak with Estill Bamberg at the [facility] who states patient will improve upon treatment as ordered.  Patient was given medications and lab was obtained as ordered.  Patient was given instructions for further worsening symptoms to go to the Emergency Department and/or call the CHF clinic.  Per Estill Bamberg patient is to be seen in the AM for recheck.  Patient offered by this RN to bring him to the emergency department for further evaluation.  Patient denies any further evaluation and states "I dont need to go to the ER".  Patient discharged via wheelchair with driver from his group home.

## 2019-08-01 ENCOUNTER — Other Ambulatory Visit: Payer: Self-pay | Admitting: Family

## 2019-08-01 ENCOUNTER — Ambulatory Visit: Payer: Medicare Other | Admitting: Family

## 2019-08-01 ENCOUNTER — Ambulatory Visit
Admission: RE | Admit: 2019-08-01 | Discharge: 2019-08-01 | Disposition: A | Discharge status: Home / Self Care | Payer: Medicare Other | Source: Ambulatory Visit | Attending: Family | Admitting: Family

## 2019-08-01 ENCOUNTER — Encounter: Payer: Self-pay | Admitting: Family

## 2019-08-01 ENCOUNTER — Other Ambulatory Visit: Payer: Self-pay

## 2019-08-01 VITALS — BP 156/130 | HR 107 | Resp 18 | Wt 259.5 lb

## 2019-08-01 DIAGNOSIS — J449 Chronic obstructive pulmonary disease, unspecified: Secondary | ICD-10-CM | POA: Insufficient documentation

## 2019-08-01 DIAGNOSIS — I5023 Acute on chronic systolic (congestive) heart failure: Secondary | ICD-10-CM

## 2019-08-01 DIAGNOSIS — E119 Type 2 diabetes mellitus without complications: Secondary | ICD-10-CM | POA: Diagnosis not present

## 2019-08-01 DIAGNOSIS — I890 Lymphedema, not elsewhere classified: Secondary | ICD-10-CM

## 2019-08-01 DIAGNOSIS — F17210 Nicotine dependence, cigarettes, uncomplicated: Secondary | ICD-10-CM | POA: Diagnosis not present

## 2019-08-01 DIAGNOSIS — R0602 Shortness of breath: Secondary | ICD-10-CM | POA: Insufficient documentation

## 2019-08-01 DIAGNOSIS — I110 Hypertensive heart disease with heart failure: Secondary | ICD-10-CM | POA: Insufficient documentation

## 2019-08-01 DIAGNOSIS — J42 Unspecified chronic bronchitis: Secondary | ICD-10-CM

## 2019-08-01 DIAGNOSIS — Z79899 Other long term (current) drug therapy: Secondary | ICD-10-CM | POA: Diagnosis not present

## 2019-08-01 DIAGNOSIS — Z7984 Long term (current) use of oral hypoglycemic drugs: Secondary | ICD-10-CM | POA: Diagnosis not present

## 2019-08-01 DIAGNOSIS — I10 Essential (primary) hypertension: Secondary | ICD-10-CM

## 2019-08-01 DIAGNOSIS — F203 Undifferentiated schizophrenia: Secondary | ICD-10-CM

## 2019-08-01 LAB — BASIC METABOLIC PANEL
Anion gap: 7 (ref 5–15)
BUN: 17 mg/dL (ref 6–20)
CO2: 34 mmol/L — ABNORMAL HIGH (ref 22–32)
Calcium: 8.9 mg/dL (ref 8.9–10.3)
Chloride: 100 mmol/L (ref 98–111)
Creatinine, Ser: 1.08 mg/dL (ref 0.61–1.24)
GFR calc Af Amer: >60 mL/min (ref >60)
GFR calc non Af Amer: >60 mL/min (ref >60)
Glucose, Bld: 195 mg/dL — ABNORMAL HIGH (ref 70–99)
Potassium: 4.5 mmol/L (ref 3.5–5.1)
Sodium: 141 mmol/L (ref 135–145)

## 2019-08-01 LAB — BRAIN NATRIURETIC PEPTIDE: B Natriuretic Peptide: 1781 pg/mL — ABNORMAL HIGH (ref 0.0–100.0)

## 2019-08-01 MED ORDER — FUROSEMIDE 10 MG/ML IJ SOLN
80.0000 mg | Freq: Once | INTRAMUSCULAR | Status: AC
  Administered 2019-08-01: 80 mg via INTRAVENOUS

## 2019-08-01 MED ORDER — POTASSIUM CHLORIDE CRYS ER 20 MEQ PO TBCR
40.0000 meq | EXTENDED_RELEASE_TABLET | Freq: Once | ORAL | Status: AC
  Administered 2019-08-01: 40 meq via ORAL

## 2019-08-01 NOTE — Patient Instructions (Signed)
Continue weighing daily and call for an overnight weight gain of > 2 pounds or a weekly weight gain of >5 pounds. 

## 2019-08-04 ENCOUNTER — Ambulatory Visit
Admission: RE | Admit: 2019-08-04 | Discharge: 2019-08-04 | Disposition: A | Discharge status: Home / Self Care | Payer: Medicare Other | Source: Ambulatory Visit | Attending: Family | Admitting: Family

## 2019-08-04 ENCOUNTER — Ambulatory Visit: Payer: Medicare Other | Admitting: Family

## 2019-08-04 ENCOUNTER — Other Ambulatory Visit: Payer: Self-pay | Admitting: Family

## 2019-08-04 ENCOUNTER — Encounter: Payer: Self-pay | Admitting: Family

## 2019-08-04 ENCOUNTER — Other Ambulatory Visit: Payer: Self-pay

## 2019-08-04 VITALS — BP 160/100 | HR 113 | Resp 24 | Ht 72.0 in | Wt 257.0 lb

## 2019-08-04 DIAGNOSIS — Z79899 Other long term (current) drug therapy: Secondary | ICD-10-CM | POA: Insufficient documentation

## 2019-08-04 DIAGNOSIS — I890 Lymphedema, not elsewhere classified: Secondary | ICD-10-CM

## 2019-08-04 DIAGNOSIS — Z823 Family history of stroke: Secondary | ICD-10-CM | POA: Diagnosis not present

## 2019-08-04 DIAGNOSIS — F209 Schizophrenia, unspecified: Secondary | ICD-10-CM | POA: Diagnosis not present

## 2019-08-04 DIAGNOSIS — I10 Essential (primary) hypertension: Secondary | ICD-10-CM

## 2019-08-04 DIAGNOSIS — J449 Chronic obstructive pulmonary disease, unspecified: Secondary | ICD-10-CM | POA: Diagnosis not present

## 2019-08-04 DIAGNOSIS — Z7984 Long term (current) use of oral hypoglycemic drugs: Secondary | ICD-10-CM | POA: Insufficient documentation

## 2019-08-04 DIAGNOSIS — I110 Hypertensive heart disease with heart failure: Secondary | ICD-10-CM | POA: Diagnosis not present

## 2019-08-04 DIAGNOSIS — E119 Type 2 diabetes mellitus without complications: Secondary | ICD-10-CM

## 2019-08-04 DIAGNOSIS — J42 Unspecified chronic bronchitis: Secondary | ICD-10-CM

## 2019-08-04 DIAGNOSIS — I5023 Acute on chronic systolic (congestive) heart failure: Secondary | ICD-10-CM | POA: Insufficient documentation

## 2019-08-04 DIAGNOSIS — Z8249 Family history of ischemic heart disease and other diseases of the circulatory system: Secondary | ICD-10-CM | POA: Diagnosis not present

## 2019-08-04 DIAGNOSIS — F17210 Nicotine dependence, cigarettes, uncomplicated: Secondary | ICD-10-CM | POA: Diagnosis not present

## 2019-08-04 DIAGNOSIS — Z886 Allergy status to analgesic agent status: Secondary | ICD-10-CM | POA: Insufficient documentation

## 2019-08-04 DIAGNOSIS — F203 Undifferentiated schizophrenia: Secondary | ICD-10-CM

## 2019-08-04 LAB — BASIC METABOLIC PANEL
Anion gap: 7 (ref 5–15)
BUN: 15 mg/dL (ref 6–20)
CO2: 30 mmol/L (ref 22–32)
Calcium: 8.3 mg/dL — ABNORMAL LOW (ref 8.9–10.3)
Chloride: 100 mmol/L (ref 98–111)
Creatinine, Ser: 0.96 mg/dL (ref 0.61–1.24)
GFR calc Af Amer: >60 mL/min (ref >60)
GFR calc non Af Amer: >60 mL/min (ref >60)
Glucose, Bld: 233 mg/dL — ABNORMAL HIGH (ref 70–99)
Potassium: 4.1 mmol/L (ref 3.5–5.1)
Sodium: 137 mmol/L (ref 135–145)

## 2019-08-04 LAB — BRAIN NATRIURETIC PEPTIDE: B Natriuretic Peptide: 1619 pg/mL — ABNORMAL HIGH (ref 0.0–100.0)

## 2019-08-04 MED ORDER — SACUBITRIL-VALSARTAN 97-103 MG PO TABS: 1.0000 | ORAL_TABLET | Freq: Two times a day (BID) | ORAL | 5 refills | Status: DC

## 2019-08-04 MED ORDER — POTASSIUM CHLORIDE CRYS ER 20 MEQ PO TBCR
40.0000 meq | EXTENDED_RELEASE_TABLET | Freq: Once | ORAL | Status: AC
  Administered 2019-08-04: 11:00:00 40 meq via ORAL

## 2019-08-04 MED ORDER — FUROSEMIDE 10 MG/ML IJ SOLN
80.0000 mg | Freq: Once | INTRAMUSCULAR | Status: AC
  Administered 2019-08-04: 11:00:00 80 mg via INTRAVENOUS

## 2019-08-04 NOTE — Patient Instructions (Signed)
Continue weighing daily and call for an overnight weight gain of > 2 pounds or a weekly weight gain of >5 pounds. 

## 2019-08-04 NOTE — Progress Notes (Signed)
Patient ID: Paul ParcelDennis Hernandez, male    DOB: November 14, 1965, 53 y.o.   MRN: [id number]  HPI  Paul Hernandez is a 53 y/o male with a history of HTN, DM, COPD, schizophrenia, current tobacco use and chronic heart failure.   Echo report from 11/30/2018 reviewed and showed an EF of 20-25%.  Was in the ED 07/08/2019 due to COPD/HF exacerbation. Prescribed lasix and prednisone and released.   He presents today for a follow-up visit with a chief complaint of moderate shortness of breath upon minimal exertion. This has been chronic and he says that his breathing is "good" but he's visibly short of breath. He has associated pedal edema along with this. He denies any difficulty sleeping, dizziness, abdominal distention, palpitations, chest pain, cough or fatigue. Says that he's getting weighed daily at the group home and is not adding any salt to his food. Has been smoking more since he was last here.   Received 80mg  IV lasix and 40meq PO potassium twice last week  Past Medical History:  Diagnosis Date  . CHF (congestive heart failure) (HCC)   . COPD (chronic obstructive pulmonary disease) (HCC)   . Diabetes mellitus without complication (HCC)   . Hypertension   . Mental health disorder    Past Surgical History:  Procedure Laterality Date  . NO PAST SURGERIES     Family History  Problem Relation Age of Onset  . CVA Mother   . CAD Father    Social History   Tobacco Use  . Smoking status: Current Some Day Smoker    Packs/day: 1.00    Years: 35.00    Pack years: 35.00    Types: Cigarettes  . Smokeless tobacco: Never Used  Substance Use Topics  . Alcohol use: No   Allergies  Allergen Reactions  . Asa [Aspirin]     Prior to Admission medications   Medication Sig Start Date End Date Taking? Authorizing Provider  albuterol (VENTOLIN HFA) 108 (90 Base) MCG/ACT inhaler Inhale 2 puffs into the lungs every 6 (six) hours as needed for wheezing or shortness of breath. 07/08/19  Yes Chesley NoonJessup, Charles, MD   amLODipine (NORVASC) 5 MG tablet Take 10 mg by mouth daily.   Yes [provider]  benztropine (COGENTIN) 2 MG tablet Take 2 mg by mouth at bedtime.   Yes [provider]  carvedilol (COREG) 25 MG tablet Take 1 tablet (25 mg total) by mouth 2 (two) times daily with a meal. 11/16/17  Yes Enedina FinnerPatel, Sona, MD  Cholecalciferol (VITAMIN D3) 5000 units CAPS Take 5,000 Units by mouth daily.   Yes [provider]  dapagliflozin propanediol (FARXIGA) 5 MG TABS tablet Take 5 mg by mouth daily.   Yes [provider]  dextrose (GLUTOSE) 40 % GEL Take by mouth once as needed for low blood sugar. For blood sugar <70   Yes [provider]  diphenhydrAMINE (BENADRYL) 25 mg capsule Take 25 mg by mouth at bedtime.    Yes [provider]  ezetimibe (ZETIA) 10 MG tablet Take 10 mg by mouth daily.   Yes [provider]  fluticasone (FLONASE) 50 MCG/ACT nasal spray Place 1 spray into both nostrils daily.   Yes [provider]  Fluticasone-Umeclidin-Vilant (TRELEGY ELLIPTA) 100-62.5-25 MCG/INH AEPB Inhale 1 puff into the lungs.   Yes [provider]  furosemide (LASIX) 40 MG tablet Take 40 mg by mouth daily.   Yes [provider]  guaifenesin (ROBITUSSIN) 100 MG/5ML syrup Take 200 mg by  mouth every 4 (four) hours as needed for cough.   Yes [provider]  haloperidol decanoate (HALDOL DECANOATE) 100 MG/ML injection Inject 90 mg into the muscle every 28 (twenty-eight) days.    Yes [provider]  isosorbide-hydrALAZINE (BIDIL) 20-37.5 MG tablet Take 1 tablet by mouth 3 (three) times daily.   Yes [provider]  metFORMIN (GLUCOPHAGE) 500 MG tablet Take 1,000 mg by mouth 2 (two) times daily with a meal.   Yes [provider]  rosuvastatin (CRESTOR) 40 MG tablet Take 40 mg by mouth daily.   Yes [provider]  sacubitril-valsartan (ENTRESTO) 49-51 MG Take 1 tablet by mouth 2 (two) times  daily. 07/14/19  Yes Delma Freeze, FNP    Review of Systems  Constitutional: Negative for appetite change, fatigue and fever.  HENT: Negative for congestion, rhinorrhea and sore throat.   Eyes: Negative.   Respiratory: Positive for shortness of breath (moderate). Negative for cough and chest tightness.   Cardiovascular: Positive for leg swelling. Negative for chest pain and palpitations.  Gastrointestinal: Negative for abdominal distention and abdominal pain.  Endocrine: Negative.   Musculoskeletal: Negative for arthralgias and back pain.  Skin: Negative for rash and wound.  Allergic/Immunologic: Negative.   Neurological: Negative for dizziness and light-headedness.  Hematological: Negative for adenopathy. Does not bruise/bleed easily.  Psychiatric/Behavioral: Negative for dysphoric mood and sleep disturbance (sleeps with 2 pillows). The patient is not nervous/anxious.    Vitals:   08/04/19 1022 08/04/19 1033  BP: (!) 163/129 (!) 160/100  Pulse: (!) 113   Resp: (!) 24   SpO2: 96%   Weight: 257 lb (116.6 kg)   Height: 6' (1.829 m)    Wt Readings from Last 3 Encounters:  08/04/19 257 lb (116.6 kg)  08/01/19 259 lb 8 oz (117.7 kg)  07/31/19 263 lb 6 oz (119.5 kg)    Lab Results  Component Value Date   CREATININE 1.08 08/01/2019   CREATININE 1.02 07/31/2019   CREATININE 1.11 07/11/2019     Physical Exam Constitutional:      Appearance: Normal appearance.  HENT:     Head: Normocephalic and atraumatic.  Neck:     Musculoskeletal: Normal range of motion and neck supple.  Cardiovascular:     Rate and Rhythm: Regular rhythm. Tachycardia present.  Pulmonary:     Effort: Pulmonary effort is normal.     Breath sounds: No wheezing or rales.  Abdominal:     General: There is no distension.     Palpations: Abdomen is soft.  Musculoskeletal:        General: No tenderness.     Right lower leg: Edema (3+ pitting up to knee) present.     Left lower leg: Edema (3+ pitting up  to knee) present.  Skin:    General: Skin is warm and dry.  Neurological:     General: No focal deficit present.     Mental Status: He is alert and oriented to person, place, and time.  Psychiatric:        Mood and Affect: Mood normal.        Behavior: Behavior normal.    Assessment & Plan:  1: Acute on Chronic heart failure with reduced ejection fraction- - NYHA class III - moderately fluid overloaded today with continued edema along with elevated BNP - weighing daily and reminded to call for an overnight weight gain of >2 pounds or a weekly weight gain of >5 pounds - weight down 2 pounds  from last visit here 3 days ago - received 80mg  IV lasix/ 61meq PO potassium yesterday twice last week - will send back today for additional 80mg  IV lasix/ 52meq PO potassium - BMP/BNP to be drawn today - will increase entresto to 97/103mg  BID - not adding salt to his food - drinking ~ 1L of tea and 1 bottle of water daily - should he remain tachycardic, can try corlanor - on farxiga but only the 5mg  dose - saw cardiology Clayborn Bigness) 05/01/18 - BNP 08/01/2019 was 1781.0 - [facility] referral was made on 08/01/2019  2: HTN- - BP elevated today; giving IV lasix today - increasing entresto per above - BMP on 08/01/2019 reviewed and showed sodium 141, potassium 4.5, creatinine 1.08 and GFR >60  3: DM- - A1c 11/29/2018 was 8.3% - glucose on 08/01/2019 was 195  4: Schizophrenia- - living at a group home called [facility] - patient cooperative but is confused about details of health  5: COPD- - saw pulmonology Ashby Dawes) 11/29/17 - has been smoking more since last here - complete cessation discussed for 3 minutes with him  6: Lymphedema- - stage 2 - not elevating his legs much and he was encouraged to elevate them when sitting for long periods of time - tried wearing the compression socks but they hurt/ cut into his legs - trying to do more walking - consider lymphapress  compression boots if edema persists  Facility medication list was reviewed.  Return in 2 days. Gas voucher given to transportation person with him due to frequent appointments.

## 2019-08-06 ENCOUNTER — Ambulatory Visit
Admission: RE | Admit: 2019-08-06 | Discharge: 2019-08-06 | Disposition: A | Discharge status: Home / Self Care | Payer: Medicare Other | Source: Ambulatory Visit | Attending: Family | Admitting: Family

## 2019-08-06 ENCOUNTER — Ambulatory Visit: Payer: Medicare Other | Admitting: Family

## 2019-08-06 ENCOUNTER — Encounter: Payer: Self-pay | Admitting: Family

## 2019-08-06 ENCOUNTER — Other Ambulatory Visit: Payer: Self-pay | Admitting: Family

## 2019-08-06 ENCOUNTER — Other Ambulatory Visit: Payer: Self-pay

## 2019-08-06 VITALS — BP 146/112 | HR 59 | Resp 18 | Ht 71.0 in | Wt 247.0 lb

## 2019-08-06 DIAGNOSIS — Z7984 Long term (current) use of oral hypoglycemic drugs: Secondary | ICD-10-CM | POA: Insufficient documentation

## 2019-08-06 DIAGNOSIS — E119 Type 2 diabetes mellitus without complications: Secondary | ICD-10-CM

## 2019-08-06 DIAGNOSIS — J449 Chronic obstructive pulmonary disease, unspecified: Secondary | ICD-10-CM | POA: Diagnosis not present

## 2019-08-06 DIAGNOSIS — I10 Essential (primary) hypertension: Secondary | ICD-10-CM

## 2019-08-06 DIAGNOSIS — J42 Unspecified chronic bronchitis: Secondary | ICD-10-CM

## 2019-08-06 DIAGNOSIS — I890 Lymphedema, not elsewhere classified: Secondary | ICD-10-CM | POA: Insufficient documentation

## 2019-08-06 DIAGNOSIS — I5023 Acute on chronic systolic (congestive) heart failure: Secondary | ICD-10-CM | POA: Diagnosis present

## 2019-08-06 DIAGNOSIS — F209 Schizophrenia, unspecified: Secondary | ICD-10-CM | POA: Insufficient documentation

## 2019-08-06 DIAGNOSIS — Z79899 Other long term (current) drug therapy: Secondary | ICD-10-CM | POA: Insufficient documentation

## 2019-08-06 DIAGNOSIS — F17210 Nicotine dependence, cigarettes, uncomplicated: Secondary | ICD-10-CM | POA: Diagnosis not present

## 2019-08-06 DIAGNOSIS — Z8249 Family history of ischemic heart disease and other diseases of the circulatory system: Secondary | ICD-10-CM | POA: Diagnosis not present

## 2019-08-06 DIAGNOSIS — F203 Undifferentiated schizophrenia: Secondary | ICD-10-CM

## 2019-08-06 DIAGNOSIS — I110 Hypertensive heart disease with heart failure: Secondary | ICD-10-CM | POA: Diagnosis not present

## 2019-08-06 LAB — BASIC METABOLIC PANEL
Anion gap: 7 (ref 5–15)
BUN: 12 mg/dL (ref 6–20)
CO2: 30 mmol/L (ref 22–32)
Calcium: 8.3 mg/dL — ABNORMAL LOW (ref 8.9–10.3)
Chloride: 104 mmol/L (ref 98–111)
Creatinine, Ser: 0.84 mg/dL (ref 0.61–1.24)
GFR calc Af Amer: >60 mL/min (ref >60)
GFR calc non Af Amer: >60 mL/min (ref >60)
Glucose, Bld: 129 mg/dL — ABNORMAL HIGH (ref 70–99)
Potassium: 4 mmol/L (ref 3.5–5.1)
Sodium: 141 mmol/L (ref 135–145)

## 2019-08-06 LAB — BRAIN NATRIURETIC PEPTIDE: B Natriuretic Peptide: 1529 pg/mL — ABNORMAL HIGH (ref 0.0–100.0)

## 2019-08-06 MED ORDER — SODIUM CHLORIDE FLUSH 0.9 % IV SOLN
INTRAVENOUS | Status: AC
  Filled 2019-08-06: qty 20

## 2019-08-06 MED ORDER — POTASSIUM CHLORIDE CRYS ER 20 MEQ PO TBCR
EXTENDED_RELEASE_TABLET | ORAL | Status: AC
  Administered 2019-08-06: 40 meq via ORAL
  Filled 2019-08-06: qty 2

## 2019-08-06 MED ORDER — TORSEMIDE 20 MG PO TABS: 40.0000 mg | ORAL_TABLET | Freq: Every day | ORAL | 3 refills | Status: DC

## 2019-08-06 MED ORDER — FUROSEMIDE 10 MG/ML IJ SOLN
INTRAMUSCULAR | Status: AC
  Administered 2019-08-06: 14:00:00 80 mg via INTRAVENOUS
  Filled 2019-08-06: qty 8

## 2019-08-06 MED ORDER — POTASSIUM CHLORIDE CRYS ER 20 MEQ PO TBCR
40.0000 meq | EXTENDED_RELEASE_TABLET | Freq: Once | ORAL | Status: AC
  Administered 2019-08-06: 14:00:00 40 meq via ORAL

## 2019-08-06 MED ORDER — FUROSEMIDE 10 MG/ML IJ SOLN
80.0000 mg | Freq: Once | INTRAMUSCULAR | Status: AC
  Administered 2019-08-06: 14:00:00 80 mg via INTRAVENOUS

## 2019-08-06 NOTE — Patient Instructions (Signed)
Continue weighing daily and call for an overnight weight gain of > 2 pounds or a weekly weight gain of >5 pounds.  Stop lasix. Start torsemide 40mg  daily.

## 2019-08-06 NOTE — Progress Notes (Signed)
Patient ID: Paul Hernandez, male    DOB: 09-27-1966, 53 y.o.   MRN: [id number]  Paul Hernandez is a 53 y/o male with a history of HTN, DM, COPD, schizophrenia, current tobacco use and chronic heart failure.   Echo report from 11/30/2018 reviewed and showed an EF of 20-25%.  Was in the ED 07/08/2019 due to COPD/HF exacerbation. Prescribed lasix and prednisone and released.   He presents today for a follow-up visit with a chief complaint of moderate shortness of breath on moderate exertion and leg swelling. He states that his shortness of breath and leg swelling have improved since his last visit. He denies fatigue, cough, chest pain, palpitations, abdominal distention, dizziness, light-headedness, and trouble sleeping. He is being weighed every day at the facility and weights were reviewed. He is still waiting for the home health nurse to call and set up a visit.   He has received 80 IV lasix/40 KCL PO x3 this past week.   Past Medical History:  Diagnosis Date  . CHF (congestive heart failure) (HCC)   . COPD (chronic obstructive pulmonary disease) (HCC)   . Diabetes mellitus without complication (HCC)   . Hypertension   . Mental health disorder    Past Surgical History:  Procedure Laterality Date  . NO PAST SURGERIES     Family History  Problem Relation Age of Onset  . CVA Mother   . CAD Father    Social History   Tobacco Use  . Smoking status: Current Some Day Smoker    Packs/day: 1.00    Years: 35.00    Pack years: 35.00    Types: Cigarettes  . Smokeless tobacco: Never Used  Substance Use Topics  . Alcohol use: No   Allergies  Allergen Reactions  . Asa [Aspirin]     Prior to Admission medications   Medication Sig Start Date End Date Taking? Authorizing Provider  albuterol (VENTOLIN HFA) 108 (90 Base) MCG/ACT inhaler Inhale 2 puffs into the lungs every 6 (six) hours as needed for wheezing or shortness of breath. 07/08/19  Yes Chesley Noon, MD  amLODipine (NORVASC) 5 MG  tablet Take 10 mg by mouth daily.   Yes [provider]  benztropine (COGENTIN) 2 MG tablet Take 2 mg by mouth at bedtime.   Yes [provider]  carvedilol (COREG) 25 MG tablet Take 1 tablet (25 mg total) by mouth 2 (two) times daily with a meal. 11/16/17  Yes Enedina Finner, MD  Cholecalciferol (VITAMIN D3) 5000 units CAPS Take 5,000 Units by mouth daily.   Yes [provider]  dapagliflozin propanediol (FARXIGA) 5 MG TABS tablet Take 5 mg by mouth daily.   Yes [provider]  dextrose (GLUTOSE) 40 % GEL Take by mouth once as needed for low blood sugar. For blood sugar <70   Yes [provider]  diphenhydrAMINE (BENADRYL) 25 mg capsule Take 25 mg by mouth at bedtime.    Yes [provider]  ezetimibe (ZETIA) 10 MG tablet Take 10 mg by mouth daily.   Yes [provider]  fluticasone (FLONASE) 50 MCG/ACT nasal spray Place 1 spray into both nostrils daily.   Yes [provider]  Fluticasone-Umeclidin-Vilant (TRELEGY ELLIPTA) 100-62.5-25 MCG/INH AEPB Inhale 1 puff into the lungs.   Yes [provider]  furosemide (LASIX) 40 MG tablet Take 40 mg by mouth daily.   Yes [provider]  guaifenesin (ROBITUSSIN) 100 MG/5ML syrup Take 200 mg by mouth every 4 (four) hours as  needed for cough.   Yes [provider]  haloperidol decanoate (HALDOL DECANOATE) 100 MG/ML injection Inject 90 mg into the muscle every 28 (twenty-eight) days.    Yes [provider]  isosorbide-hydrALAZINE (BIDIL) 20-37.5 MG tablet Take 1 tablet by mouth 3 (three) times daily.   Yes [provider]  metFORMIN (GLUCOPHAGE) 500 MG tablet Take 1,000 mg by mouth 2 (two) times daily with a meal.   Yes [provider]  rosuvastatin (CRESTOR) 40 MG tablet Take 40 mg by mouth daily.   Yes [provider]  sacubitril-valsartan (ENTRESTO) 97-103 MG Take 1 tablet by mouth 2 (two) times daily. 08/04/19  Yes Delma FreezeHackney,  [name], FNP     Review of Systems  Constitutional: Negative for appetite change, fatigue and fever.  HENT: Negative for congestion, rhinorrhea and sore throat.   Eyes: Negative.   Respiratory: Positive for shortness of breath (improving). Negative for cough and chest tightness.   Cardiovascular: Positive for leg swelling (improving). Negative for chest pain and palpitations.  Gastrointestinal: Negative for abdominal distention and abdominal pain.  Endocrine: Negative.   Musculoskeletal: Negative for arthralgias and back pain.  Skin: Negative for rash and wound.  Allergic/Immunologic: Negative.   Neurological: Negative for dizziness and light-headedness.  Hematological: Negative for adenopathy. Does not bruise/bleed easily.  Psychiatric/Behavioral: Negative for dysphoric mood and sleep disturbance (sleeps with 2 pillows). The patient is not nervous/anxious.    Vitals:   08/06/19 1241  BP: (!) 146/112  Pulse: (!) 59  Resp: 18  SpO2: 100%  Weight: 247 lb (112 kg)  Height: 5\' 11"  (1.803 m)   Wt Readings from Last 3 Encounters:  08/06/19 247 lb (112 kg)  08/04/19 257 lb (116.6 kg)  08/01/19 259 lb 8 oz (117.7 kg)    Lab Results  Component Value Date   CREATININE 0.96 08/04/2019   CREATININE 1.08 08/01/2019   CREATININE 1.02 07/31/2019    Physical Exam Constitutional:      Appearance: Normal appearance.  HENT:     Head: Normocephalic and atraumatic.  Neck:     Musculoskeletal: Normal range of motion and neck supple.  Cardiovascular:     Rate and Rhythm: Regular rhythm. Tachycardia present.  Pulmonary:     Effort: Pulmonary effort is normal.     Breath sounds: Wheezing present. No rales.  Abdominal:     General: There is no distension.     Palpations: Abdomen is soft.  Musculoskeletal:        General: No tenderness.     Right lower leg: Edema (3+ pitting up to knee (improved some)) present.     Left lower leg: Edema (3+ pitting up to knee (improved some)) present.   Skin:    General: Skin is warm and dry.  Neurological:     General: No focal deficit present.     Mental Status: He is alert and oriented to person, place, and time.  Psychiatric:        Mood and Affect: Mood normal.        Behavior: Behavior normal.    Assessment & Plan:  1: Acute on Chronic heart failure with reduced ejection fraction- - NYHA class III - moderately fluid overloaded today with continued edema along with elevated BNP - weighing daily and reminded to call for an overnight weight gain of >2 pounds or a weekly weight gain of >5 pounds - weight down 10 pounds from last visit here 2 days ago - received 80mg  IV lasix/  77meq PO potassium yesterday twice last week and Monday 08/04/2019 - will send back today for additional 80mg  IV lasix/ 39meq PO potassium - BMP/BNP to be drawn today - will change lasix to torsemide 40mg  PO daily #90, 3 refills - not adding salt to his food - drinking ~ 1L of tea and 1 bottle of water daily - should he remain tachycardic, can try corlanor. HR upon auscultation 110. - on farxiga but only the 5mg  dose - saw cardiology Clayborn Bigness) 05/01/18 - BNP 08/04/2019 was 1619.0 - [facility] referral was [facility] on 08/01/2019  2: HTN- - BP elevated today, however improving; giving IV lasix today - BMP on 08/04/2019 reviewed and showed sodium 137, potassium 4.1, creatinine 0.96 and GFR >60  3: DM- - A1c 11/29/2018 was 8.3% - glucose today was 99  4: Schizophrenia- - living at a group home called [facility] - patient cooperative but is confused about details of health  5: COPD- - saw pulmonology Ashby Dawes) 11/29/17 - has been smoking more since last here - complete cessation discussed for 3 minutes with him  6: Lymphedema- - stage 2 - not elevating his legs much and he was encouraged to elevate them when sitting for long periods of time - ordered larger size of compression stockings - trying to do more walking - consider  lymphapress compression boots if edema persists  Facility medication list was reviewed.  Return in 1 week or sooner for any questions/problems.

## 2019-08-06 NOTE — Progress Notes (Signed)
Mr. Paul Hernandez room 19  , tachypneic and hypertensive on arrival , states "I feel better today "   IV  Placed right hand meds adm. Per orders , labs drawn . Pt. Discharged via wheelchair to driver from group home.

## 2019-08-07 ENCOUNTER — Encounter: Payer: Self-pay | Admitting: Family

## 2019-08-11 ENCOUNTER — Encounter: Payer: Self-pay | Admitting: Family

## 2019-08-11 ENCOUNTER — Ambulatory Visit: Payer: Medicare Other | Attending: Family | Admitting: Family

## 2019-08-11 ENCOUNTER — Other Ambulatory Visit: Payer: Self-pay

## 2019-08-11 VITALS — BP 154/122 | HR 111 | Resp 18 | Ht 72.0 in | Wt 250.8 lb

## 2019-08-11 DIAGNOSIS — J449 Chronic obstructive pulmonary disease, unspecified: Secondary | ICD-10-CM | POA: Insufficient documentation

## 2019-08-11 DIAGNOSIS — F17210 Nicotine dependence, cigarettes, uncomplicated: Secondary | ICD-10-CM | POA: Diagnosis not present

## 2019-08-11 DIAGNOSIS — Z886 Allergy status to analgesic agent status: Secondary | ICD-10-CM | POA: Insufficient documentation

## 2019-08-11 DIAGNOSIS — Z7984 Long term (current) use of oral hypoglycemic drugs: Secondary | ICD-10-CM | POA: Insufficient documentation

## 2019-08-11 DIAGNOSIS — I5023 Acute on chronic systolic (congestive) heart failure: Secondary | ICD-10-CM | POA: Insufficient documentation

## 2019-08-11 DIAGNOSIS — Z8249 Family history of ischemic heart disease and other diseases of the circulatory system: Secondary | ICD-10-CM | POA: Diagnosis not present

## 2019-08-11 DIAGNOSIS — J42 Unspecified chronic bronchitis: Secondary | ICD-10-CM

## 2019-08-11 DIAGNOSIS — I5022 Chronic systolic (congestive) heart failure: Secondary | ICD-10-CM

## 2019-08-11 DIAGNOSIS — Z79899 Other long term (current) drug therapy: Secondary | ICD-10-CM | POA: Insufficient documentation

## 2019-08-11 DIAGNOSIS — E119 Type 2 diabetes mellitus without complications: Secondary | ICD-10-CM | POA: Diagnosis not present

## 2019-08-11 DIAGNOSIS — F203 Undifferentiated schizophrenia: Secondary | ICD-10-CM

## 2019-08-11 DIAGNOSIS — I890 Lymphedema, not elsewhere classified: Secondary | ICD-10-CM | POA: Diagnosis not present

## 2019-08-11 DIAGNOSIS — I110 Hypertensive heart disease with heart failure: Secondary | ICD-10-CM | POA: Insufficient documentation

## 2019-08-11 DIAGNOSIS — F209 Schizophrenia, unspecified: Secondary | ICD-10-CM | POA: Insufficient documentation

## 2019-08-11 DIAGNOSIS — I10 Essential (primary) hypertension: Secondary | ICD-10-CM

## 2019-08-11 MED ORDER — IVABRADINE HCL 5 MG PO TABS: 5.0000 mg | ORAL_TABLET | Freq: Two times a day (BID) | ORAL | 3 refills | Status: DC

## 2019-08-11 NOTE — Patient Instructions (Signed)
Continue weighing daily and call for an overnight weight gain of >2 pounds or a weekly weight gain of >5 pounds.  Start corlanor 5mg , 1 pill by mouth twice a day

## 2019-08-11 NOTE — Progress Notes (Signed)
Patient ID: Paul Hernandez, male    DOB: April 01, 1966, 53 y.o.   MRN: [id number]  Paul Hernandez is a 53 y/o male with a history of HTN, DM, COPD, schizophrenia, current tobacco use and chronic heart failure.   Echo report from 11/30/2018 reviewed and showed an EF of 20-25%.  Was in the ED 07/08/2019 due to COPD/HF exacerbation. Prescribed lasix and prednisone and released.   He presents today for a follow-up visit with a chief complaint of improving shortness of breath on moderate exertion and stable leg swelling. He used a wheelchair to get from the front door to the office. He denies chest pain, palpitations, abdominal distention, dizziness, and trouble sleeping.   He has received 80 IV lasix/40 KCL PO x4 this past 2 weeks.   Past Medical History:  Diagnosis Date  . CHF (congestive heart failure) ([place])   . COPD (chronic obstructive pulmonary disease) ([place])   . Diabetes mellitus without complication ([place])   . Hypertension   . Mental health disorder    Past Surgical History:  Procedure Laterality Date  . NO PAST SURGERIES     Family History  Problem Relation Age of Onset  . CVA Mother   . CAD Father    Social History   Tobacco Use  . Smoking status: Current Some Day Smoker    Packs/day: 1.00    Years: 35.00    Pack years: 35.00    Types: Cigarettes  . Smokeless tobacco: Never Used  Substance Use Topics  . Alcohol use: No   Allergies  Allergen Reactions  . Asa [Aspirin]    Prior to Admission medications   Medication Sig Start Date End Date Taking? Authorizing Provider  albuterol (VENTOLIN HFA) 108 (90 Base) MCG/ACT inhaler Inhale 2 puffs into the lungs every 6 (six) hours as needed for wheezing or shortness of breath. 07/08/19  Yes Blake Divine, MD  amLODipine (NORVASC) 5 MG tablet Take 10 mg by mouth daily.   Yes [provider]  benztropine (COGENTIN) 2 MG tablet Take 2 mg by mouth at bedtime.   Yes [provider]  carvedilol (COREG) 25 MG tablet Take 1  tablet (25 mg total) by mouth 2 (two) times daily with a meal. 11/16/17  Yes [name], MD  Cholecalciferol (VITAMIN D3) 5000 units CAPS Take 5,000 Units by mouth daily.   Yes [provider]  dapagliflozin propanediol (FARXIGA) 5 MG TABS tablet Take 5 mg by mouth daily.   Yes [provider]  dextrose (GLUTOSE) 40 % GEL Take by mouth once as needed for low blood sugar. For blood sugar <70   Yes [provider]  diphenhydrAMINE (BENADRYL) 25 mg capsule Take 25 mg by mouth at bedtime.    Yes [provider]  ezetimibe (ZETIA) 10 MG tablet Take 10 mg by mouth daily.   Yes [provider]  fluticasone (FLONASE) 50 MCG/ACT nasal spray Place 1 spray into both nostrils daily.   Yes [provider]  Fluticasone-Umeclidin-Vilant (TRELEGY ELLIPTA) 100-62.5-25 MCG/INH AEPB Inhale 1 puff into the lungs.   Yes [provider]  guaifenesin (ROBITUSSIN) 100 MG/5ML syrup Take 200 mg by mouth every 4 (four) hours as needed for cough.   Yes [provider]  haloperidol decanoate (HALDOL DECANOATE) 100 MG/ML injection Inject 90 mg into the muscle every 28 (twenty-eight) days.    Yes [provider]  isosorbide-hydrALAZINE (BIDIL) 20-37.5 MG tablet Take 1 tablet by mouth 3 (three) times daily.   Yes [provider]  metFORMIN (GLUCOPHAGE) 500 MG tablet Take 1,000 mg by mouth 2 (two) times daily with a meal.   Yes [provider]  rosuvastatin (CRESTOR) 40 MG tablet Take 40 mg by mouth daily.   Yes [provider]  sacubitril-valsartan (ENTRESTO) 97-103 MG Take 1 tablet by mouth 2 (two) times daily. 08/04/19  Yes [name], Inetta Fermo A, FNP  torsemide (DEMADEX) 20 MG tablet Take 2 tablets (40 mg total) by mouth daily. 08/06/19 11/04/19 Yes Delma Freeze, FNP    Review of Systems  Constitutional: Negative for appetite change, fatigue and fever.  HENT: Negative for congestion, rhinorrhea and sore throat.   Eyes:  Negative.   Respiratory: Positive for shortness of breath (improving). Negative for cough and chest tightness.   Cardiovascular: Positive for leg swelling (improving). Negative for chest pain and palpitations.  Gastrointestinal: Negative for abdominal distention and abdominal pain.  Endocrine: Negative.   Musculoskeletal: Negative for arthralgias and back pain.  Skin: Negative for rash and wound.  Allergic/Immunologic: Negative.   Neurological: Negative for dizziness and light-headedness.  Hematological: Negative for adenopathy. Does not bruise/bleed easily.  Psychiatric/Behavioral: Negative for dysphoric mood and sleep disturbance (sleeps with 2 pillows). The patient is not nervous/anxious.    Vitals:   08/11/19 0846  BP: (!) 154/122  Pulse: (!) 111  Resp: 18  SpO2: 95%   Filed Weights   08/11/19 0846  Weight: 250 lb 12.8 oz (113.8 kg)   Lab Results  Component Value Date   CREATININE 0.84 08/06/2019   CREATININE 0.96 08/04/2019   CREATININE 1.08 08/01/2019    Physical Exam Constitutional:      Appearance: Normal appearance.  HENT:     Head: Normocephalic and atraumatic.  Neck:     Musculoskeletal: Normal range of motion and neck supple.  Cardiovascular:     Rate and Rhythm: Regular rhythm. Tachycardia present.  Pulmonary:     Effort: Pulmonary effort is normal.     Breath sounds: Wheezing present. No rales.  Abdominal:     General: There is no distension.     Palpations: Abdomen is soft.  Musculoskeletal:     Right lower leg: Edema (2+ pitting up to knee) present.     Left lower leg: Edema (2+ pitting up to knee) present.  Skin:    General: Skin is warm and dry.  Neurological:     General: No focal deficit present.     Mental Status: He is alert and oriented to person, place, and time.  Psychiatric:        Mood and Affect: Mood normal.        Behavior: Behavior normal.    Assessment & Plan:  1: Acute on Chronic heart failure with reduced ejection  fraction- - NYHA class III - mild fluid overload today. Improved from last visit 1 weeks ago - weighing daily and reminded to call for an overnight weight gain of >2 pounds or a weekly weight gain of >5 pounds - weight up 3 pounds from last visit here 5 days ago - received 80mg  IV lasix/ PO potassium 4 times in last 2 weeks - not adding salt to his food - drinking ~ 1L of tea and 1 bottle of water daily - start corlanor 5mg  PO BID #60, 3 refills. EKG ordered and confirmed sinus rhythm.  - on farxiga but only the 5mg  dose - consider adding spironolactone in the future - saw cardiology ) 05/01/18 - BNP 08/06/2019 was 1529.0 - [facility] referral [facility]. Patient states someone from hospice came out on Saturday, 08/09/19, although slightly confused about who came. Will follow-up with home health agency. After calling [facility], they state they did not see him and do not have him on the schedule at this time.   2: HTN- - BP improved today. Manual recheck 132/98 - BMP on 08/06/2019 reviewed and showed sodium 141, potassium 4.0, creatinine 0.84 and GFR >60  3: DM- - A1c 11/29/2018 was 8.3% - glucose Friday 08/08/2019 was 143. Refused sugar to be checked last 3 days.   4: Schizophrenia- - living at a group home called [facility] - patient cooperative but is confused about details of health  5: COPD- - saw pulmonology Nicholos Johns(Ramachandran) 11/29/17 - has been smoking more since last here - complete cessation discussed for 3 minutes with him  6: Lymphedema- - stage 2 - not elevating his legs much and he was encouraged to elevate them when sitting for long periods of time - still waiting for larger size of compression socks. Written prescription given.  - trying to do more walking - consider lymphapress compression boots if edema persists  Facility medication list was reviewed.  Return in 2 weeks or sooner for any questions/problems.

## 2019-08-25 NOTE — Progress Notes (Signed)
Patient ID: Paul Hernandez, male    DOB: 09-14-1966, 53 y.o.   MRN: [id number]  HPI  Paul Hernandez is a 53 y/o male with a history of HTN, DM, COPD, schizophrenia, current tobacco use and chronic heart failure.   Echo report from 11/30/2018 reviewed and showed an EF of 20-25%.  Was in the ED 07/08/2019 due to COPD/HF exacerbation. Prescribed lasix and prednisone and released.   He presents today for a follow-up visit with a chief complaint of minimal shortness of breath upon moderate exertion. He describes this as chronic in nature having been present for several months although has improved. He has no associated symptoms and specifically denies difficulty sleeping, dizziness, abdominal distention, palpitations, pedal edema, chest pain, cough, fatigue or weight gain. Overall he says that he "feels great".    Past Medical History:  Diagnosis Date  . CHF (congestive heart failure) (HCC)   . COPD (chronic obstructive pulmonary disease) (HCC)   . Diabetes mellitus without complication (HCC)   . Hypertension   . Mental health disorder    Past Surgical History:  Procedure Laterality Date  . NO PAST SURGERIES     Family History  Problem Relation Age of Onset  . CVA Mother   . CAD Father    Social History   Tobacco Use  . Smoking status: Current Some Day Smoker    Packs/day: 1.00    Years: 35.00    Pack years: 35.00    Types: Cigarettes  . Smokeless tobacco: Never Used  Substance Use Topics  . Alcohol use: No   Allergies  Allergen Reactions  . Asa [Aspirin]    Prior to Admission medications   Medication Sig Start Date End Date Taking? Authorizing Provider  albuterol (VENTOLIN HFA) 108 (90 Base) MCG/ACT inhaler Inhale 2 puffs into the lungs every 6 (six) hours as needed for wheezing or shortness of breath. 07/08/19  Yes Chesley Noon, MD  amLODipine (NORVASC) 5 MG tablet Take 10 mg by mouth daily.   Yes [provider]  benztropine (COGENTIN) 2 MG tablet Take 2 mg by mouth  at bedtime.   Yes [provider]  carvedilol (COREG) 25 MG tablet Take 1 tablet (25 mg total) by mouth 2 (two) times daily with a meal. 11/16/17  Yes Enedina Finner, MD  Cholecalciferol (VITAMIN D3) 5000 units CAPS Take 5,000 Units by mouth daily.   Yes [provider]  dapagliflozin propanediol (FARXIGA) 5 MG TABS tablet Take 5 mg by mouth daily.   Yes [provider]  dextrose (GLUTOSE) 40 % GEL Take by mouth once as needed for low blood sugar. For blood sugar <70   Yes [provider]  diphenhydrAMINE (BENADRYL) 25 mg capsule Take 25 mg by mouth at bedtime.    Yes [provider]  ezetimibe (ZETIA) 10 MG tablet Take 10 mg by mouth daily.   Yes [provider]  fluticasone (FLONASE) 50 MCG/ACT nasal spray Place 1 spray into both nostrils daily.   Yes [provider]  Fluticasone-Umeclidin-Vilant (TRELEGY ELLIPTA) 100-62.5-25 MCG/INH AEPB Inhale 1 puff into the lungs.   Yes [provider]  guaifenesin (ROBITUSSIN) 100 MG/5ML syrup Take 200 mg by mouth every 4 (four) hours as needed for cough.   Yes [provider]  haloperidol decanoate (HALDOL DECANOATE) 100 MG/ML injection Inject 90 mg into the muscle every 28 (twenty-eight) days.    Yes [provider]  isosorbide-hydrALAZINE (BIDIL) 20-37.5 MG tablet Take 1 tablet by mouth 3 (  three) times daily.   Yes [provider]  ivabradine (CORLANOR) 5 MG TABS tablet Take 1 tablet (5 mg total) by mouth 2 (two) times daily with a meal. 08/11/19  Yes Darylene Price A, FNP  metFORMIN (GLUCOPHAGE) 500 MG tablet Take 1,000 mg by mouth 2 (two) times daily with a meal.   Yes [provider]  rosuvastatin (CRESTOR) 40 MG tablet Take 40 mg by mouth daily.   Yes [provider]  sacubitril-valsartan (ENTRESTO) 97-103 MG Take 1 tablet by mouth 2 (two) times daily. 08/04/19  Yes [name], [name], FNP  torsemide (DEMADEX) 20 MG tablet Take 2 tablets (40 mg  total) by mouth daily. 08/06/19 11/04/19 Yes [name], FNP     Review of Systems  Constitutional: Negative for appetite change, fatigue and fever.  HENT: Negative for congestion, rhinorrhea and sore throat.   Eyes: Negative.   Respiratory: Positive for shortness of breath (minimal). Negative for cough and chest tightness.   Cardiovascular: Negative for chest pain, palpitations and leg swelling.  Gastrointestinal: Negative for abdominal distention and abdominal pain.  Endocrine: Negative.   Musculoskeletal: Negative for arthralgias and back pain.  Skin: Negative for rash and wound.  Allergic/Immunologic: Negative.   Neurological: Negative for dizziness and light-headedness.  Hematological: Negative for adenopathy. Does not bruise/bleed easily.  Psychiatric/Behavioral: Negative for dysphoric mood and sleep disturbance (sleeps with 2 pillows). The patient is not nervous/anxious.    Vitals:   08/28/19 0907 08/28/19 0908  BP: (!) 159/126 (!) 160/100  Pulse: (!) 118   Resp: 18   SpO2: 98%   Weight: 233 lb 12.8 oz (106.1 kg)   Height: 6' (1.829 m)    Wt Readings from Last 3 Encounters:  08/28/19 233 lb 12.8 oz (106.1 kg)  08/11/19 250 lb 12.8 oz (113.8 kg)  08/06/19 247 lb (112 kg)   Lab Results  Component Value Date   CREATININE 0.84 08/06/2019   CREATININE 0.96 08/04/2019   CREATININE 1.08 08/01/2019     Physical Exam Constitutional:      Appearance: Normal appearance.  HENT:     Head: Normocephalic and atraumatic.  Neck:     Musculoskeletal: Normal range of motion and neck supple.  Cardiovascular:     Rate and Rhythm: Regular rhythm. Tachycardia present.  Pulmonary:     Effort: Pulmonary effort is normal.     Breath sounds: Examination of the right-lower field reveals rhonchi. Examination of the left-lower field reveals rhonchi. Rhonchi present. No wheezing or rales.  Abdominal:     General: There is no distension.     Palpations: Abdomen is soft.   Musculoskeletal:        General: No tenderness.     Right lower leg: Edema (trace pitting) present.     Left lower leg: Edema (trace pitting) present.  Skin:    General: Skin is warm and dry.  Neurological:     General: No focal deficit present.     Mental Status: He is alert and oriented to person, place, and time.  Psychiatric:        Mood and Affect: Mood normal.        Behavior: Behavior normal.    Assessment & Plan:  1: Chronic heart failure with reduced ejection fraction- - NYHA class II - euvolemic today - weighing daily and reminded to call for an overnight weight gain of >2 pounds or a weekly weight gain of >5 pounds - weight down 17 pounds from last visit  here 2 1/2 weeks ago - not adding salt to his food - drinking ~ 1L of tea and 1 bottle of water daily - corlanor started at last visit and continues to be tachycardic; may titrate up next visit - on farxiga but only the 5mg  dose; need to titrate up to 10mg  in the future - saw cardiology Paul Pares([place]) 05/01/18 - BNP 08/01/2019 was 1781.0 - waiting on home health orders to be signed  2: HTN- - BP elevated today - will add spironolactone 12.5mg  daily for resistant HTN; will check BMP at his next visit - BMP on 08/01/2019 reviewed and showed sodium 141, potassium 4.5, creatinine 1.08 and GFR >60  3: DM- - A1c 11/29/2018 was 8.3% - glucose this morning was 223  4: Schizophrenia- - living at a group home called [facility] - patient cooperative   5: COPD- - saw pulmonology Paul Johns([place]) 11/29/17 - has smoked 1 cigarette today - complete cessation discussed for 3 minutes with him  6: Lymphedema- - stage 2 - resolved  Facility medication list was reviewed.  Return in 2 weeks or sooner for any questions/problems before then.

## 2019-08-28 ENCOUNTER — Ambulatory Visit: Payer: Medicare Other | Attending: Family | Admitting: Family

## 2019-08-28 ENCOUNTER — Encounter: Payer: Self-pay | Admitting: Family

## 2019-08-28 ENCOUNTER — Other Ambulatory Visit: Payer: Self-pay

## 2019-08-28 VITALS — BP 160/100 | HR 118 | Resp 18 | Ht 72.0 in | Wt 233.8 lb

## 2019-08-28 DIAGNOSIS — F203 Undifferentiated schizophrenia: Secondary | ICD-10-CM

## 2019-08-28 DIAGNOSIS — Z886 Allergy status to analgesic agent status: Secondary | ICD-10-CM | POA: Diagnosis not present

## 2019-08-28 DIAGNOSIS — F209 Schizophrenia, unspecified: Secondary | ICD-10-CM | POA: Diagnosis not present

## 2019-08-28 DIAGNOSIS — Z8249 Family history of ischemic heart disease and other diseases of the circulatory system: Secondary | ICD-10-CM | POA: Diagnosis not present

## 2019-08-28 DIAGNOSIS — I5022 Chronic systolic (congestive) heart failure: Secondary | ICD-10-CM | POA: Insufficient documentation

## 2019-08-28 DIAGNOSIS — F17210 Nicotine dependence, cigarettes, uncomplicated: Secondary | ICD-10-CM | POA: Insufficient documentation

## 2019-08-28 DIAGNOSIS — I509 Heart failure, unspecified: Secondary | ICD-10-CM | POA: Diagnosis present

## 2019-08-28 DIAGNOSIS — I10 Essential (primary) hypertension: Secondary | ICD-10-CM

## 2019-08-28 DIAGNOSIS — Z79899 Other long term (current) drug therapy: Secondary | ICD-10-CM | POA: Insufficient documentation

## 2019-08-28 DIAGNOSIS — J42 Unspecified chronic bronchitis: Secondary | ICD-10-CM

## 2019-08-28 DIAGNOSIS — I890 Lymphedema, not elsewhere classified: Secondary | ICD-10-CM | POA: Insufficient documentation

## 2019-08-28 DIAGNOSIS — Z7984 Long term (current) use of oral hypoglycemic drugs: Secondary | ICD-10-CM | POA: Insufficient documentation

## 2019-08-28 DIAGNOSIS — E119 Type 2 diabetes mellitus without complications: Secondary | ICD-10-CM | POA: Insufficient documentation

## 2019-08-28 DIAGNOSIS — J449 Chronic obstructive pulmonary disease, unspecified: Secondary | ICD-10-CM | POA: Insufficient documentation

## 2019-08-28 DIAGNOSIS — I110 Hypertensive heart disease with heart failure: Secondary | ICD-10-CM | POA: Diagnosis not present

## 2019-08-28 MED ORDER — SPIRONOLACTONE 25 MG PO TABS: 12.5000 mg | ORAL_TABLET | Freq: Every day | ORAL | 3 refills | Status: DC

## 2019-08-28 NOTE — Patient Instructions (Signed)
Continue weighing daily and call for an overnight weight gain of > 2 pounds or a weekly weight gain of >5 pounds. 

## 2019-09-10 NOTE — Progress Notes (Signed)
Patient ID: Paul Hernandez, male    DOB: Feb 22, 1966, 53 y.o.   MRN: [id number]  HPI  Paul Hernandez is a 53 y/o male with a history of HTN, DM, COPD, schizophrenia, current tobacco use and chronic heart failure.   Echo report from 11/30/2018 reviewed and showed an EF of 20-25%.  Was in the ED 07/08/2019 due to COPD/HF exacerbation. Prescribed lasix and prednisone and released.   He presents today for a follow-up visit with a chief complaint of minimal shortness of breath upon moderate exertion. He describes this as chronic in nature having been present for several years. He has associated weight gain along with this. He denies any dizziness, difficulty sleeping, abdominal distention, palpitations, pedal edema, chest pain, cough or fatigue.   Past Medical History:  Diagnosis Date  . CHF (congestive heart failure) (HCC)   . COPD (chronic obstructive pulmonary disease) (HCC)   . Diabetes mellitus without complication (HCC)   . Hypertension   . Mental health disorder    Past Surgical History:  Procedure Laterality Date  . NO PAST SURGERIES     Family History  Problem Relation Age of Onset  . CVA Mother   . CAD Father    Social History   Tobacco Use  . Smoking status: Current Some Day Smoker    Packs/day: 1.00    Years: 35.00    Pack years: 35.00    Types: Cigarettes  . Smokeless tobacco: Never Used  Substance Use Topics  . Alcohol use: No   Allergies  Allergen Reactions  . Asa [Aspirin]    Prior to Admission medications   Medication Sig Start Date End Date Taking? Authorizing Provider  albuterol (VENTOLIN HFA) 108 (90 Base) MCG/ACT inhaler Inhale 2 puffs into the lungs every 6 (six) hours as needed for wheezing or shortness of breath. 07/08/19  Yes Chesley NoonJessup, Charles, MD  amLODipine (NORVASC) 5 MG tablet Take 10 mg by mouth daily.   Yes [provider]  benztropine (COGENTIN) 2 MG tablet Take 2 mg by mouth at bedtime.   Yes [provider]  carvedilol (COREG) 25  MG tablet Take 1 tablet (25 mg total) by mouth 2 (two) times daily with a meal. 11/16/17  Yes Enedina FinnerPatel, Sona, MD  Cholecalciferol (VITAMIN D3) 5000 units CAPS Take 5,000 Units by mouth daily.   Yes [provider]  dapagliflozin propanediol (FARXIGA) 5 MG TABS tablet Take 5 mg by mouth daily.   Yes [provider]  dextrose (GLUTOSE) 40 % GEL Take by mouth once as needed for low blood sugar. For blood sugar <70   Yes [provider]  diphenhydrAMINE (BENADRYL) 25 mg capsule Take 25 mg by mouth at bedtime.    Yes [provider]  ezetimibe (ZETIA) 10 MG tablet Take 10 mg by mouth daily.   Yes [provider]  fluticasone (FLONASE) 50 MCG/ACT nasal spray Place 1 spray into both nostrils daily.   Yes [provider]  Fluticasone-Umeclidin-Vilant (TRELEGY ELLIPTA) 100-62.5-25 MCG/INH AEPB Inhale 1 puff into the lungs.   Yes [provider]  guaifenesin (ROBITUSSIN) 100 MG/5ML syrup Take 200 mg by mouth every 4 (four) hours as needed for cough.   Yes [provider]  haloperidol decanoate (HALDOL DECANOATE) 100 MG/ML injection Inject 90 mg into the muscle every 28 (twenty-eight) days.    Yes [provider]  isosorbide-hydrALAZINE (BIDIL) 20-37.5 MG tablet Take 1 tablet by mouth 3 (three) times daily.   Yes [provider]  ivabradine (CORLANOR) 5 MG TABS tablet Take 1 tablet (5 mg total) by mouth 2 (two) times daily with a meal. 08/11/19  Yes [name], [name], FNP  metFORMIN (GLUCOPHAGE) 500 MG tablet Take 1,000 mg by mouth 2 (two) times daily with a meal.   Yes [provider]  rosuvastatin (CRESTOR) 40 MG tablet Take 40 mg by mouth daily.   Yes [provider]  sacubitril-valsartan (ENTRESTO) 97-103 MG Take 1 tablet by mouth 2 (two) times daily. 08/04/19  Yes [name], Inetta Fermo A, FNP  spironolactone (ALDACTONE) 25 MG tablet Take 0.5 tablets (12.5 mg total) by mouth daily. 08/28/19 11/26/19 Yes [name], Inetta Fermo  A, FNP  torsemide (DEMADEX) 20 MG tablet Take 2 tablets (40 mg total) by mouth daily. 08/06/19 11/04/19 Yes Delma Freeze, FNP    Review of Systems  Constitutional: Negative for appetite change, fatigue and fever.  HENT: Negative for congestion, rhinorrhea and sore throat.   Eyes: Negative.   Respiratory: Positive for shortness of breath (minimal). Negative for cough and chest tightness.   Cardiovascular: Negative for chest pain, palpitations and leg swelling.  Gastrointestinal: Negative for abdominal distention and abdominal pain.  Endocrine: Negative.   Musculoskeletal: Negative for arthralgias and back pain.  Skin: Negative for rash and wound.  Allergic/Immunologic: Negative.   Neurological: Negative for dizziness and light-headedness.  Hematological: Negative for adenopathy. Does not bruise/bleed easily.  Psychiatric/Behavioral: Negative for dysphoric mood and sleep disturbance (sleeps with 2 pillows). The patient is not nervous/anxious.    Vitals:   09/11/19 1139 09/11/19 1141  BP: (!) 169/130 (!) 160/100  Pulse: (!) 58 100  Resp: 18   SpO2: 96%   Weight: 250 lb 12.8 oz (113.8 kg)   Height: 6' (1.829 m)    Wt Readings from Last 3 Encounters:  09/11/19 250 lb 12.8 oz (113.8 kg)  08/28/19 233 lb 12.8 oz (106.1 kg)  08/11/19 250 lb 12.8 oz (113.8 kg)   Lab Results  Component Value Date   CREATININE 0.84 08/06/2019   CREATININE 0.96 08/04/2019   CREATININE 1.08 08/01/2019     Physical Exam Constitutional:      Appearance: Normal appearance.  HENT:     Head: Normocephalic and atraumatic.  Neck:     Musculoskeletal: Normal range of motion and neck supple.  Cardiovascular:     Rate and Rhythm: Regular rhythm. Bradycardia present.  Pulmonary:     Effort: Pulmonary effort is normal.     Breath sounds: No wheezing, rhonchi or rales.  Abdominal:     General: There is no distension.     Palpations: Abdomen is soft.  Musculoskeletal:        General: No tenderness.      Right lower leg: Edema (1+ pitting) present.     Left lower leg: Edema (1+ pitting) present.  Skin:    General: Skin is warm and dry.  Neurological:     General: No focal deficit present.     Mental Status: He is alert and oriented to person, place, and time.  Psychiatric:        Mood and Affect: Mood normal.        Behavior: Behavior normal.    Assessment & Plan:  1: Chronic heart failure with reduced ejection fraction- - NYHA class II - euvolemic today - weighing daily and reminded to call for an overnight weight gain of >2 pounds or a weekly weight gain of >5 pounds - weight up 17 pounds from last visit here 2  weeks ago; upon reviewing MAR weights, he had gained 9 pounds overnight 2 nights ago; order written again for the group home to call us for above weight gain - not adding salt to his food - drinking ~ 1L of tea and 1 bottle of water daily - on farxiga but only the 5mg  dose; need to titrate up to 10mg  at next visit - saw cardiology Clayborn Bigness) 05/01/18 - BNP 08/01/2019 was 1781.0 - [facility]  2: HTN- - BP elevated today - spironolactone 12.5mg  daily added at last visit; will increase to 25mg  daily  - will check BMP today - BMP on 08/01/2019 reviewed and showed sodium 141, potassium 4.5, creatinine 1.08 and GFR >60  3: DM- - A1c 11/29/2018 was 8.3% - glucose this morning was 164  4: Schizophrenia- - living at a group home called [facility] - patient cooperative   5: COPD- - saw pulmonology Ashby Dawes) 11/29/17 - has not smoked in the last 4 days - continued cessation discussed for 3 minutes with him  6: Lymphedema- - stage 2 - still does not have compression socks - elevating his legs at times  Facility medication list was reviewed.  Return in 1 month or sooner for any questions/problems before then.

## 2019-09-11 ENCOUNTER — Other Ambulatory Visit: Payer: Self-pay

## 2019-09-11 ENCOUNTER — Ambulatory Visit: Payer: Medicare Other | Attending: Family | Admitting: Family

## 2019-09-11 ENCOUNTER — Encounter: Payer: Self-pay | Admitting: Family

## 2019-09-11 ENCOUNTER — Ambulatory Visit: Admitting: Family

## 2019-09-11 VITALS — BP 160/100 | HR 100 | Resp 18 | Ht 72.0 in | Wt 250.8 lb

## 2019-09-11 DIAGNOSIS — Z823 Family history of stroke: Secondary | ICD-10-CM | POA: Diagnosis not present

## 2019-09-11 DIAGNOSIS — J449 Chronic obstructive pulmonary disease, unspecified: Secondary | ICD-10-CM | POA: Diagnosis not present

## 2019-09-11 DIAGNOSIS — Z886 Allergy status to analgesic agent status: Secondary | ICD-10-CM | POA: Insufficient documentation

## 2019-09-11 DIAGNOSIS — Z8249 Family history of ischemic heart disease and other diseases of the circulatory system: Secondary | ICD-10-CM | POA: Diagnosis not present

## 2019-09-11 DIAGNOSIS — Z7984 Long term (current) use of oral hypoglycemic drugs: Secondary | ICD-10-CM | POA: Diagnosis not present

## 2019-09-11 DIAGNOSIS — F17210 Nicotine dependence, cigarettes, uncomplicated: Secondary | ICD-10-CM | POA: Insufficient documentation

## 2019-09-11 DIAGNOSIS — I110 Hypertensive heart disease with heart failure: Secondary | ICD-10-CM | POA: Insufficient documentation

## 2019-09-11 DIAGNOSIS — J42 Unspecified chronic bronchitis: Secondary | ICD-10-CM

## 2019-09-11 DIAGNOSIS — F203 Undifferentiated schizophrenia: Secondary | ICD-10-CM

## 2019-09-11 DIAGNOSIS — F209 Schizophrenia, unspecified: Secondary | ICD-10-CM | POA: Diagnosis not present

## 2019-09-11 DIAGNOSIS — I5022 Chronic systolic (congestive) heart failure: Secondary | ICD-10-CM | POA: Diagnosis not present

## 2019-09-11 DIAGNOSIS — I890 Lymphedema, not elsewhere classified: Secondary | ICD-10-CM | POA: Diagnosis not present

## 2019-09-11 DIAGNOSIS — I10 Essential (primary) hypertension: Secondary | ICD-10-CM

## 2019-09-11 DIAGNOSIS — Z79899 Other long term (current) drug therapy: Secondary | ICD-10-CM | POA: Diagnosis not present

## 2019-09-11 DIAGNOSIS — E119 Type 2 diabetes mellitus without complications: Secondary | ICD-10-CM | POA: Insufficient documentation

## 2019-09-11 LAB — BASIC METABOLIC PANEL
Anion gap: 9 (ref 5–15)
BUN: 13 mg/dL (ref 6–20)
CO2: 30 mmol/L (ref 22–32)
Calcium: 8.6 mg/dL — ABNORMAL LOW (ref 8.9–10.3)
Chloride: 103 mmol/L (ref 98–111)
Creatinine, Ser: 0.95 mg/dL (ref 0.61–1.24)
GFR calc Af Amer: >60 mL/min (ref >60)
GFR calc non Af Amer: >60 mL/min (ref >60)
Glucose, Bld: 184 mg/dL — ABNORMAL HIGH (ref 70–99)
Potassium: 3.8 mmol/L (ref 3.5–5.1)
Sodium: 142 mmol/L (ref 135–145)

## 2019-09-11 NOTE — Patient Instructions (Signed)
Continue weighing daily and call for an overnight weight gain of > 2 pounds or a weekly weight gain of >5 pounds. 

## 2019-09-22 ENCOUNTER — Telehealth: Payer: Self-pay | Admitting: Family

## 2019-09-22 MED ORDER — POTASSIUM CHLORIDE CRYS ER 20 MEQ PO TBCR: 20.0000 meq | EXTENDED_RELEASE_TABLET | Freq: Every day | ORAL | 0 refills | Status: DC

## 2019-09-22 MED ORDER — METOLAZONE 2.5 MG PO TABS: 2.5000 mg | ORAL_TABLET | Freq: Every day | ORAL | 0 refills | Status: DC

## 2019-09-22 NOTE — Telephone Encounter (Signed)
Returned message left from the group home called [facility]. Spoke with Paul Hernandez as she says that patient weighed 244.1 pounds yesterday and today he weighed 252.4 pounds.   She says that his shortness of breath isn't any worse but she's unsure of his swelling. She says that he "probably had more salt" in his diet over the Thanksgiving weekend.   Will send in metolazone 2.5mg  daily for the next 3 days along with potassium 6meq daily for 3 days. Will see him in clinic in 3 days and check lab work at that time.

## 2019-09-26 ENCOUNTER — Ambulatory Visit: Payer: Medicare Other | Attending: Family | Admitting: Family

## 2019-09-26 ENCOUNTER — Other Ambulatory Visit: Payer: Self-pay

## 2019-09-26 ENCOUNTER — Encounter: Payer: Self-pay | Admitting: Family

## 2019-09-26 VITALS — BP 109/81 | HR 89 | Resp 20 | Ht 71.0 in | Wt 225.8 lb

## 2019-09-26 DIAGNOSIS — I5022 Chronic systolic (congestive) heart failure: Secondary | ICD-10-CM | POA: Insufficient documentation

## 2019-09-26 DIAGNOSIS — I890 Lymphedema, not elsewhere classified: Secondary | ICD-10-CM | POA: Insufficient documentation

## 2019-09-26 DIAGNOSIS — I110 Hypertensive heart disease with heart failure: Secondary | ICD-10-CM | POA: Diagnosis not present

## 2019-09-26 DIAGNOSIS — Z7984 Long term (current) use of oral hypoglycemic drugs: Secondary | ICD-10-CM | POA: Diagnosis not present

## 2019-09-26 DIAGNOSIS — F209 Schizophrenia, unspecified: Secondary | ICD-10-CM | POA: Insufficient documentation

## 2019-09-26 DIAGNOSIS — J449 Chronic obstructive pulmonary disease, unspecified: Secondary | ICD-10-CM | POA: Diagnosis not present

## 2019-09-26 DIAGNOSIS — Z79899 Other long term (current) drug therapy: Secondary | ICD-10-CM | POA: Insufficient documentation

## 2019-09-26 DIAGNOSIS — F203 Undifferentiated schizophrenia: Secondary | ICD-10-CM

## 2019-09-26 DIAGNOSIS — Z87891 Personal history of nicotine dependence: Secondary | ICD-10-CM | POA: Insufficient documentation

## 2019-09-26 DIAGNOSIS — I10 Essential (primary) hypertension: Secondary | ICD-10-CM

## 2019-09-26 DIAGNOSIS — Z8249 Family history of ischemic heart disease and other diseases of the circulatory system: Secondary | ICD-10-CM | POA: Insufficient documentation

## 2019-09-26 DIAGNOSIS — E119 Type 2 diabetes mellitus without complications: Secondary | ICD-10-CM | POA: Diagnosis not present

## 2019-09-26 DIAGNOSIS — J42 Unspecified chronic bronchitis: Secondary | ICD-10-CM

## 2019-09-26 LAB — BASIC METABOLIC PANEL
Anion gap: 11 (ref 5–15)
BUN: 14 mg/dL (ref 6–20)
CO2: 35 mmol/L — ABNORMAL HIGH (ref 22–32)
Calcium: 8.4 mg/dL — ABNORMAL LOW (ref 8.9–10.3)
Chloride: 91 mmol/L — ABNORMAL LOW (ref 98–111)
Creatinine, Ser: 1.22 mg/dL (ref 0.61–1.24)
GFR calc Af Amer: >60 mL/min (ref >60)
GFR calc non Af Amer: >60 mL/min (ref >60)
Glucose, Bld: 320 mg/dL — ABNORMAL HIGH (ref 70–99)
Potassium: 3.5 mmol/L (ref 3.5–5.1)
Sodium: 137 mmol/L (ref 135–145)

## 2019-09-26 NOTE — Patient Instructions (Signed)
Continue weighing daily and call for an overnight weight gain of > 2 pounds or a weekly weight gain of >5 pounds. 

## 2019-09-26 NOTE — Progress Notes (Signed)
Patient ID: Paul Hernandez, male    DOB: 10/26/1965, [age] y.o.   MRN: [id number]  HPI  Paul Hernandez is a 53 y/o male with a history of HTN, DM, COPD, schizophrenia, current tobacco use and chronic heart failure.   Echo report from 11/30/2018 reviewed and showed an EF of 20-25%.  Was in the ED 07/08/2019 due to COPD/HF exacerbation. Prescribed lasix and prednisone and released.   He presents today with a chief complaint of a follow-up visit. He says that he's currently not having any symptoms and specifically denies any weight gain, dizziness, difficulty sleeping, abdominal distention, palpitations, pedal edema, chest pain, shortness of breath, cough or fatigue.   Took 3 days of 2.5mg  metolazone/ 20meq potassium earlier this week for weight gain and has lost a lot of weight and says that he feels "great". [place] [facility] [place].    Past Medical History:  Diagnosis Date  . CHF (congestive heart failure) (HCC)   . COPD (chronic obstructive pulmonary disease) (HCC)   . Diabetes mellitus without complication (HCC)   . Hypertension   . Mental health disorder    Past Surgical History:  Procedure Laterality Date  . NO PAST SURGERIES     Family History  Problem Relation Age of Onset  . CVA Mother   . CAD Father    Social History   Tobacco Use  . Smoking status: Former Smoker    Packs/day: 1.00    Years: 35.00    Pack years: 35.00    Types: Cigarettes    Start date: 09/07/2019  . Smokeless tobacco: Never Used  Substance Use Topics  . Alcohol use: No   Allergies  Allergen Reactions  . Asa [Aspirin]    Prior to Admission medications   Medication Sig Start Date End Date Taking? Authorizing Provider  albuterol (VENTOLIN HFA) 108 (90 Base) MCG/ACT inhaler Inhale 2 puffs into the lungs every 6 (six) hours as needed for wheezing or shortness of breath. 07/08/19  Yes Chesley NoonJessup, Charles, MD  amLODipine (NORVASC) 5 MG tablet Take 10 mg by mouth daily.   Yes  [provider]  benztropine (COGENTIN) 2 MG tablet Take 2 mg by mouth at bedtime.   Yes [provider]  carvedilol (COREG) 25 MG tablet Take 1 tablet (25 mg total) by mouth 2 (two) times daily with a meal. 11/16/17  Yes Enedina FinnerPatel, Sona, MD  Cholecalciferol (VITAMIN D3) 5000 units CAPS Take 5,000 Units by mouth daily.   Yes [provider]  dapagliflozin propanediol (FARXIGA) 5 MG TABS tablet Take 5 mg by mouth daily.   Yes [provider]  dextrose (GLUTOSE) 40 % GEL Take by mouth once as needed for low blood sugar. For blood sugar <70   Yes [provider]  diphenhydrAMINE (BENADRYL) 25 mg capsule Take 25 mg by mouth at bedtime.    Yes [provider]  fluticasone (FLONASE) 50 MCG/ACT nasal spray Place 1 spray into both nostrils daily.   Yes [provider]  Fluticasone-Umeclidin-Vilant (TRELEGY ELLIPTA) 100-62.5-25 MCG/INH AEPB Inhale 1 puff into the lungs.   Yes [provider]  guaifenesin (ROBITUSSIN) 100 MG/5ML syrup Take 200 mg by mouth every 4 (four) hours as needed for cough.   Yes [provider]  haloperidol decanoate (HALDOL DECANOATE) 100 MG/ML injection Inject 90 mg into the muscle every 28 (twenty-eight) days.    Yes [provider]  isosorbide-hydrALAZINE (BIDIL) 20-37.5 MG tablet Take 1  tablet by mouth 3 (three) times daily.   Yes [provider]  ivabradine (CORLANOR) 5 MG TABS tablet Take 1 tablet (5 mg total) by mouth 2 (two) times daily with a meal. 08/11/19  Yes Clarisa Kindred A, FNP  metFORMIN (GLUCOPHAGE) 500 MG tablet Take 1,000 mg by mouth 2 (two) times daily with a meal.   Yes [provider]  rosuvastatin (CRESTOR) 40 MG tablet Take 40 mg by mouth daily.   Yes [provider]  sacubitril-valsartan (ENTRESTO) 97-103 MG Take 1 tablet by mouth 2 (two) times daily. 08/04/19  Yes [name], Inetta Fermo A, FNP  spironolactone (ALDACTONE) 25 MG tablet Take 0.5 tablets (12.5 mg  total) by mouth daily. Patient taking differently: Take 25 mg by mouth daily.  08/28/19 11/26/19 Yes [name], Inetta Fermo A, FNP  torsemide (DEMADEX) 20 MG tablet Take 2 tablets (40 mg total) by mouth daily. 08/06/19 11/04/19 Yes [name], Inetta Fermo A, FNP  ezetimibe (ZETIA) 10 MG tablet Take 10 mg by mouth daily.    [provider]     Review of Systems  Constitutional: Negative for appetite change, fatigue and fever.  HENT: Negative for congestion, rhinorrhea and sore throat.   Eyes: Negative.   Respiratory: Negative for cough, chest tightness and shortness of breath.   Cardiovascular: Negative for chest pain, palpitations and leg swelling.  Gastrointestinal: Negative for abdominal distention and abdominal pain.  Endocrine: Negative.   Musculoskeletal: Negative for arthralgias and back pain.  Skin: Negative for rash and wound.  Allergic/Immunologic: Negative.   Neurological: Negative for dizziness and light-headedness.  Hematological: Negative for adenopathy. Does not bruise/bleed easily.  Psychiatric/Behavioral: Negative for dysphoric mood and sleep disturbance (sleeps with 2 pillows). The patient is not nervous/anxious.    Vitals:   09/26/19 0928  BP: 109/81  Pulse: 89  Resp: 20  SpO2: 96%  Weight: 225 lb 12.8 oz (102.4 kg)  Height: 5\' 11"  (1.803 m)   Wt Readings from Last 3 Encounters:  09/26/19 225 lb 12.8 oz (102.4 kg)  09/11/19 250 lb 12.8 oz (113.8 kg)  08/28/19 233 lb 12.8 oz (106.1 kg)   Lab Results  Component Value Date   CREATININE 0.95 09/11/2019   CREATININE 0.84 08/06/2019   CREATININE 0.96 08/04/2019    Physical Exam Constitutional:      Appearance: Normal appearance.  HENT:     Head: Normocephalic and atraumatic.  Neck:     Musculoskeletal: Normal range of motion and neck supple.  Cardiovascular:     Rate and Rhythm: Normal rate and regular rhythm.  Pulmonary:     Effort: Pulmonary effort is normal.     Breath sounds: No wheezing, rhonchi or rales.   Abdominal:     General: There is no distension.     Palpations: Abdomen is soft.  Musculoskeletal:        General: No tenderness.     Right lower leg: No edema.     Left lower leg: No edema.  Skin:    General: Skin is warm and dry.  Neurological:     General: No focal deficit present.     Mental Status: He is alert and oriented to person, place, and time.  Psychiatric:        Mood and Affect: Mood normal.        Behavior: Behavior normal.    Assessment & Plan:  1: Chronic heart failure with reduced ejection fraction- - NYHA class I - euvolemic today - weighing daily and reminded to call  for an overnight weight gain of >2 pounds or a weekly weight gain of >5 pounds - weight down 25 pounds from last visit here 2  weeks ago - will check BMP today as he took metolazone / potassium for 3 days earlier this week and had drastic weight loss - facility weight chart reviewed and shows similar weight loss - not adding salt to his food - drinking ~ 1L of tea and 1 bottle of water daily - saw cardiology Clayborn Bigness) 05/01/18 - BNP 08/01/2019 was 1781.0 - [facility]  2: HTN- - BP looks good today - BMP on 09/11/2019 reviewed and showed sodium 142, potassium 3.8, creatinine 0.95 and GFR >60  3: DM- - A1c 11/29/2018 was 8.3%  4: Schizophrenia- - living at a group home called [facility] - patient cooperative   5: COPD- - saw pulmonology Ashby Dawes) 11/29/17 - still smoking but "not much" - complete cessation discussed for 3 minutes with him  6: Lymphedema- - stage 2 - resolved  Facility medication list was reviewed.  Return in 2 months or sooner for any questions/problems before then.

## 2019-10-09 ENCOUNTER — Ambulatory Visit: Admitting: Family

## 2019-12-03 ENCOUNTER — Inpatient Hospital Stay

## 2019-12-03 ENCOUNTER — Other Ambulatory Visit: Payer: Self-pay

## 2019-12-03 ENCOUNTER — Inpatient Hospital Stay
Admission: AD | Admit: 2019-12-03 | Discharge: 2019-12-10 | DRG: 291 | Disposition: A | Discharge status: Home / Self Care | Source: Ambulatory Visit | Attending: Internal Medicine | Admitting: Internal Medicine

## 2019-12-03 DIAGNOSIS — I429 Cardiomyopathy, unspecified: Secondary | ICD-10-CM | POA: Diagnosis present

## 2019-12-03 DIAGNOSIS — J441 Chronic obstructive pulmonary disease with (acute) exacerbation: Secondary | ICD-10-CM | POA: Diagnosis present

## 2019-12-03 DIAGNOSIS — F209 Schizophrenia, unspecified: Secondary | ICD-10-CM | POA: Diagnosis present

## 2019-12-03 DIAGNOSIS — Z87891 Personal history of nicotine dependence: Secondary | ICD-10-CM

## 2019-12-03 DIAGNOSIS — Z8249 Family history of ischemic heart disease and other diseases of the circulatory system: Secondary | ICD-10-CM

## 2019-12-03 DIAGNOSIS — Z20822 Contact with and (suspected) exposure to covid-19: Secondary | ICD-10-CM | POA: Diagnosis present

## 2019-12-03 DIAGNOSIS — I5023 Acute on chronic systolic (congestive) heart failure: Secondary | ICD-10-CM

## 2019-12-03 DIAGNOSIS — Z823 Family history of stroke: Secondary | ICD-10-CM

## 2019-12-03 DIAGNOSIS — R0602 Shortness of breath: Secondary | ICD-10-CM

## 2019-12-03 DIAGNOSIS — I5031 Acute diastolic (congestive) heart failure: Secondary | ICD-10-CM | POA: Diagnosis not present

## 2019-12-03 DIAGNOSIS — I110 Hypertensive heart disease with heart failure: Secondary | ICD-10-CM | POA: Diagnosis present

## 2019-12-03 DIAGNOSIS — I471 Supraventricular tachycardia: Secondary | ICD-10-CM | POA: Diagnosis present

## 2019-12-03 DIAGNOSIS — J984 Other disorders of lung: Secondary | ICD-10-CM

## 2019-12-03 DIAGNOSIS — Z7984 Long term (current) use of oral hypoglycemic drugs: Secondary | ICD-10-CM | POA: Diagnosis not present

## 2019-12-03 DIAGNOSIS — J9601 Acute respiratory failure with hypoxia: Secondary | ICD-10-CM | POA: Diagnosis not present

## 2019-12-03 DIAGNOSIS — Z7951 Long term (current) use of inhaled steroids: Secondary | ICD-10-CM | POA: Diagnosis not present

## 2019-12-03 DIAGNOSIS — R579 Shock, unspecified: Secondary | ICD-10-CM | POA: Diagnosis not present

## 2019-12-03 DIAGNOSIS — Z79899 Other long term (current) drug therapy: Secondary | ICD-10-CM | POA: Diagnosis not present

## 2019-12-03 DIAGNOSIS — E785 Hyperlipidemia, unspecified: Secondary | ICD-10-CM | POA: Diagnosis present

## 2019-12-03 DIAGNOSIS — R0902 Hypoxemia: Secondary | ICD-10-CM

## 2019-12-03 DIAGNOSIS — E119 Type 2 diabetes mellitus without complications: Secondary | ICD-10-CM

## 2019-12-03 DIAGNOSIS — Z886 Allergy status to analgesic agent status: Secondary | ICD-10-CM

## 2019-12-03 DIAGNOSIS — I10 Essential (primary) hypertension: Secondary | ICD-10-CM

## 2019-12-03 DIAGNOSIS — I472 Ventricular tachycardia: Secondary | ICD-10-CM | POA: Diagnosis not present

## 2019-12-03 LAB — CBC
HCT: 52.4 % — ABNORMAL HIGH (ref 39.0–52.0)
Hemoglobin: 15.4 g/dL (ref 13.0–17.0)
MCH: 26.4 pg (ref 26.0–34.0)
MCHC: 29.4 g/dL — ABNORMAL LOW (ref 30.0–36.0)
MCV: 89.7 fL (ref 80.0–100.0)
Platelets: 308 10*3/uL (ref 150–400)
RBC: 5.84 MIL/uL — ABNORMAL HIGH (ref 4.22–5.81)
RDW: 16.4 % — ABNORMAL HIGH (ref 11.5–15.5)
WBC: 8.4 10*3/uL (ref 4.0–10.5)
nRBC: 0 % (ref 0.0–0.2)

## 2019-12-03 LAB — BASIC METABOLIC PANEL
Anion gap: 11 (ref 5–15)
BUN: 17 mg/dL (ref 6–20)
CO2: 34 mmol/L — ABNORMAL HIGH (ref 22–32)
Calcium: 8.9 mg/dL (ref 8.9–10.3)
Chloride: 96 mmol/L — ABNORMAL LOW (ref 98–111)
Creatinine, Ser: 0.97 mg/dL (ref 0.61–1.24)
GFR calc Af Amer: >60 mL/min (ref >60)
GFR calc non Af Amer: >60 mL/min (ref >60)
Glucose, Bld: 187 mg/dL — ABNORMAL HIGH (ref 70–99)
Potassium: 4.7 mmol/L (ref 3.5–5.1)
Sodium: 141 mmol/L (ref 135–145)

## 2019-12-03 LAB — MRSA PCR SCREENING: MRSA by PCR: NEGATIVE

## 2019-12-03 LAB — RESPIRATORY PANEL BY RT PCR (FLU A&B, COVID)
Influenza A by PCR: NEGATIVE
Influenza B by PCR: NEGATIVE
SARS Coronavirus 2 by RT PCR: NEGATIVE

## 2019-12-03 LAB — GLUCOSE, CAPILLARY
Glucose-Capillary: 172 mg/dL — ABNORMAL HIGH (ref 70–99)
Glucose-Capillary: 141 mg/dL — ABNORMAL HIGH (ref 70–99)
Glucose-Capillary: 232 mg/dL — ABNORMAL HIGH (ref 70–99)

## 2019-12-03 LAB — BRAIN NATRIURETIC PEPTIDE: B Natriuretic Peptide: 4350 pg/mL — ABNORMAL HIGH (ref 0.0–100.0)

## 2019-12-03 MED ORDER — BENZTROPINE MESYLATE 1 MG PO TABS
2.0000 mg | ORAL_TABLET | Freq: Every evening | ORAL | Status: DC | PRN
  Administered 2019-12-03: 2 mg via ORAL
  Filled 2019-12-03 (×2): qty 2

## 2019-12-03 MED ORDER — FUROSEMIDE 10 MG/ML IJ SOLN
60.0000 mg | Freq: Two times a day (BID) | INTRAMUSCULAR | Status: DC
  Administered 2019-12-03 – 2019-12-04 (×3): 60 mg via INTRAVENOUS
  Filled 2019-12-03 (×3): qty 6

## 2019-12-03 MED ORDER — EZETIMIBE 10 MG PO TABS
10.0000 mg | ORAL_TABLET | Freq: Every day | ORAL | Status: DC
  Administered 2019-12-04 – 2019-12-05 (×2): 10 mg via ORAL
  Filled 2019-12-03 (×2): qty 1

## 2019-12-03 MED ORDER — MORPHINE SULFATE (CONCENTRATE) 10 MG/0.5ML PO SOLN: 10.0000 mg | ORAL | Status: DC | PRN

## 2019-12-03 MED ORDER — IVABRADINE HCL 5 MG PO TABS
5.0000 mg | ORAL_TABLET | Freq: Two times a day (BID) | ORAL | Status: DC
  Administered 2019-12-04 (×2): 5 mg via ORAL
  Filled 2019-12-03 (×4): qty 1

## 2019-12-03 MED ORDER — ENOXAPARIN SODIUM 40 MG/0.4ML ~~LOC~~ SOLN
40.0000 mg | SUBCUTANEOUS | Status: DC
  Administered 2019-12-03 – 2019-12-09 (×7): 40 mg via SUBCUTANEOUS
  Filled 2019-12-03 (×8): qty 0.4

## 2019-12-03 MED ORDER — CHLORHEXIDINE GLUCONATE 0.12 % MT SOLN
15.0000 mL | Freq: Two times a day (BID) | OROMUCOSAL | Status: DC
  Administered 2019-12-03 – 2019-12-09 (×13): 15 mL via OROMUCOSAL
  Filled 2019-12-03 (×12): qty 15

## 2019-12-03 MED ORDER — FUROSEMIDE 10 MG/ML IJ SOLN
60.0000 mg | Freq: Once | INTRAMUSCULAR | Status: AC
  Administered 2019-12-03: 60 mg via INTRAVENOUS
  Filled 2019-12-03: qty 6

## 2019-12-03 MED ORDER — CHLORHEXIDINE GLUCONATE CLOTH 2 % EX PADS
6.0000 | MEDICATED_PAD | Freq: Every day | CUTANEOUS | Status: DC
  Administered 2019-12-03 – 2019-12-10 (×7): 6 via TOPICAL

## 2019-12-03 MED ORDER — INSULIN ASPART 100 UNIT/ML ~~LOC~~ SOLN
0.0000 [IU] | Freq: Every day | SUBCUTANEOUS | Status: DC
  Administered 2019-12-03: 2 [IU] via SUBCUTANEOUS
  Administered 2019-12-06: 21:00:00 4 [IU] via SUBCUTANEOUS
  Administered 2019-12-07 – 2019-12-08 (×2): 2 [IU] via SUBCUTANEOUS
  Filled 2019-12-03 (×4): qty 1

## 2019-12-03 MED ORDER — DM-GUAIFENESIN ER 30-600 MG PO TB12
1.0000 | ORAL_TABLET | Freq: Two times a day (BID) | ORAL | Status: DC
  Administered 2019-12-03 – 2019-12-05 (×4): 1 via ORAL
  Filled 2019-12-03 (×6): qty 1

## 2019-12-03 MED ORDER — METHYLPREDNISOLONE SODIUM SUCC 125 MG IJ SOLR
60.0000 mg | Freq: Two times a day (BID) | INTRAMUSCULAR | Status: DC
  Administered 2019-12-03 – 2019-12-04 (×3): 60 mg via INTRAVENOUS
  Filled 2019-12-03 (×3): qty 2

## 2019-12-03 MED ORDER — ROSUVASTATIN CALCIUM 10 MG PO TABS
40.0000 mg | ORAL_TABLET | Freq: Every day | ORAL | Status: DC
  Administered 2019-12-04: 40 mg via ORAL
  Filled 2019-12-03: qty 4

## 2019-12-03 MED ORDER — ALBUTEROL SULFATE HFA 108 (90 BASE) MCG/ACT IN AERS
2.0000 | INHALATION_SPRAY | RESPIRATORY_TRACT | Status: DC | PRN
  Filled 2019-12-03: qty 6.7

## 2019-12-03 MED ORDER — SODIUM CHLORIDE 0.9% FLUSH
3.0000 mL | Freq: Two times a day (BID) | INTRAVENOUS | Status: DC
  Administered 2019-12-03 – 2019-12-05 (×5): 3 mL via INTRAVENOUS

## 2019-12-03 MED ORDER — AMLODIPINE BESYLATE 10 MG PO TABS
10.0000 mg | ORAL_TABLET | Freq: Every day | ORAL | Status: DC
  Filled 2019-12-03: qty 1

## 2019-12-03 MED ORDER — IPRATROPIUM-ALBUTEROL 20-100 MCG/ACT IN AERS
1.0000 | INHALATION_SPRAY | Freq: Three times a day (TID) | RESPIRATORY_TRACT | Status: DC
  Filled 2019-12-03: qty 4

## 2019-12-03 MED ORDER — SODIUM CHLORIDE 0.9% FLUSH
3.0000 mL | INTRAVENOUS | Status: DC | PRN
Start: 2019-12-03 — End: 2019-12-05

## 2019-12-03 MED ORDER — ALBUTEROL SULFATE (2.5 MG/3ML) 0.083% IN NEBU
2.5000 mg | INHALATION_SOLUTION | RESPIRATORY_TRACT | Status: DC | PRN
  Administered 2019-12-03: 2.5 mg via RESPIRATORY_TRACT
  Filled 2019-12-03: qty 3

## 2019-12-03 MED ORDER — ONDANSETRON HCL 4 MG/2ML IJ SOLN
4.0000 mg | Freq: Three times a day (TID) | INTRAMUSCULAR | Status: DC | PRN
  Administered 2019-12-05: 01:00:00 4 mg via INTRAVENOUS
  Filled 2019-12-03: qty 2

## 2019-12-03 MED ORDER — IPRATROPIUM BROMIDE HFA 17 MCG/ACT IN AERS: 2.0000 | INHALATION_SPRAY | RESPIRATORY_TRACT | Status: DC

## 2019-12-03 MED ORDER — SACUBITRIL-VALSARTAN 97-103 MG PO TABS
1.0000 | ORAL_TABLET | Freq: Two times a day (BID) | ORAL | Status: DC
  Administered 2019-12-03 – 2019-12-04 (×3): 1 via ORAL
  Filled 2019-12-03 (×5): qty 1

## 2019-12-03 MED ORDER — HYDROMORPHONE HCL 1 MG/ML IJ SOLN
1.0000 mg | INTRAMUSCULAR | Status: DC | PRN
  Administered 2019-12-03: 1 mg via INTRAVENOUS
  Filled 2019-12-03: qty 1

## 2019-12-03 MED ORDER — ORAL CARE MOUTH RINSE
15.0000 mL | Freq: Two times a day (BID) | OROMUCOSAL | Status: DC
  Administered 2019-12-03 – 2019-12-10 (×6): 15 mL via OROMUCOSAL

## 2019-12-03 MED ORDER — ACETAMINOPHEN 325 MG PO TABS: 650.0000 mg | ORAL_TABLET | Freq: Four times a day (QID) | ORAL | Status: DC | PRN

## 2019-12-03 MED ORDER — CARVEDILOL 25 MG PO TABS
25.0000 mg | ORAL_TABLET | Freq: Two times a day (BID) | ORAL | Status: DC
  Administered 2019-12-04: 08:00:00 25 mg via ORAL
  Filled 2019-12-03: qty 1

## 2019-12-03 MED ORDER — GLYCOPYRROLATE 25 MCG/ML IN SOLN: 25.0000 ug | Freq: Two times a day (BID) | RESPIRATORY_TRACT | Status: DC

## 2019-12-03 MED ORDER — IPRATROPIUM-ALBUTEROL 0.5-2.5 (3) MG/3ML IN SOLN
3.0000 mL | Freq: Four times a day (QID) | RESPIRATORY_TRACT | Status: DC
  Administered 2019-12-03 – 2019-12-08 (×17): 3 mL via RESPIRATORY_TRACT
  Filled 2019-12-03 (×19): qty 3

## 2019-12-03 MED ORDER — HYDRALAZINE HCL 20 MG/ML IJ SOLN
10.0000 mg | INTRAMUSCULAR | Status: DC | PRN
  Administered 2019-12-03: 10 mg via INTRAVENOUS
  Filled 2019-12-03: qty 1

## 2019-12-03 MED ORDER — INSULIN ASPART 100 UNIT/ML ~~LOC~~ SOLN
0.0000 [IU] | Freq: Three times a day (TID) | SUBCUTANEOUS | Status: DC
  Administered 2019-12-03: 1 [IU] via SUBCUTANEOUS
  Administered 2019-12-04: 17:00:00 3 [IU] via SUBCUTANEOUS
  Administered 2019-12-04: 08:00:00 2 [IU] via SUBCUTANEOUS
  Administered 2019-12-05: 17:00:00 3 [IU] via SUBCUTANEOUS
  Administered 2019-12-05: 13:00:00 7 [IU] via SUBCUTANEOUS
  Administered 2019-12-05: 3 [IU] via SUBCUTANEOUS
  Administered 2019-12-06: 7 [IU] via SUBCUTANEOUS
  Administered 2019-12-06: 16:00:00 3 [IU] via SUBCUTANEOUS
  Administered 2019-12-06 – 2019-12-07 (×2): 5 [IU] via SUBCUTANEOUS
  Administered 2019-12-07: 18:00:00 2 [IU] via SUBCUTANEOUS
  Administered 2019-12-07: 12:00:00 5 [IU] via SUBCUTANEOUS
  Administered 2019-12-08: 12:00:00 3 [IU] via SUBCUTANEOUS
  Administered 2019-12-08: 10:00:00 2 [IU] via SUBCUTANEOUS
  Administered 2019-12-08: 17:00:00 3 [IU] via SUBCUTANEOUS
  Administered 2019-12-09: 17:00:00 1 [IU] via SUBCUTANEOUS
  Administered 2019-12-09 (×2): 2 [IU] via SUBCUTANEOUS
  Administered 2019-12-10: 13:00:00 1 [IU] via SUBCUTANEOUS
  Filled 2019-12-03 (×18): qty 1

## 2019-12-03 MED ORDER — VITAMIN D 25 MCG (1000 UNIT) PO TABS
5000.0000 [IU] | ORAL_TABLET | Freq: Every day | ORAL | Status: DC
  Administered 2019-12-04 – 2019-12-05 (×2): 5000 [IU] via ORAL
  Filled 2019-12-03 (×2): qty 5

## 2019-12-03 MED ORDER — ALBUTEROL SULFATE (2.5 MG/3ML) 0.083% IN NEBU: 2.5000 mg | INHALATION_SOLUTION | RESPIRATORY_TRACT | Status: DC | PRN

## 2019-12-03 MED ORDER — LORAZEPAM 0.5 MG PO TABS: 0.5000 mg | ORAL_TABLET | ORAL | Status: DC | PRN

## 2019-12-03 MED ORDER — FLUTICASONE PROPIONATE 50 MCG/ACT NA SUSP
1.0000 | Freq: Every day | NASAL | Status: DC
  Administered 2019-12-04 – 2019-12-05 (×2): 1 via NASAL
  Filled 2019-12-03: qty 16

## 2019-12-03 MED ORDER — BUDESONIDE 180 MCG/ACT IN AEPB
1.0000 | INHALATION_SPRAY | Freq: Three times a day (TID) | RESPIRATORY_TRACT | Status: DC
  Administered 2019-12-04 – 2019-12-05 (×4): 1 via RESPIRATORY_TRACT
  Filled 2019-12-03 (×2): qty 1

## 2019-12-03 MED ORDER — ISOSORB DINITRATE-HYDRALAZINE 20-37.5 MG PO TABS
1.0000 | ORAL_TABLET | Freq: Three times a day (TID) | ORAL | Status: DC
  Administered 2019-12-03 – 2019-12-04 (×2): 1 via ORAL
  Filled 2019-12-03 (×7): qty 1

## 2019-12-03 MED ORDER — SODIUM CHLORIDE 0.9 % IV SOLN: 250.0000 mL | INTRAVENOUS | Status: DC | PRN

## 2019-12-03 MED ORDER — DIPHENHYDRAMINE HCL 25 MG PO CAPS
25.0000 mg | ORAL_CAPSULE | Freq: Every evening | ORAL | Status: DC | PRN
  Filled 2019-12-03: qty 1

## 2019-12-03 NOTE — H&P (Signed)
History and Physical    Paul Hernandez:[id number] DOB: 24-Jun-1966 DOA: 12/03/2019  Referring MD/NP/PA:   PCP: Armando Gang, FNP   Patient coming from:  The patient is coming from home.  At baseline, pt is independent for most of ADL.        Chief Complaint: SOB  HPI: Paul Hernandez is a 54 y.o. male with medical history significant of sCHF with EF 20-25%, hypertension, diabetes mellitus, COPD, schizophrenia, who presents with shortness of breath. This is direct admission from clinic, NP Victorino Dike.  Patient states that he has worsening shortness breath since yesterday, which has been progressively worsening.  He has mild dry cough. He denies fever, chills, chest pain.  Patient denies nausea, vomiting, diarrhea, abdominal pain, symptoms of UTI or unilateral weakness. Pt was seen by NP, Victorino Dike at the clinic, an was found to have bilateral leg edema and mild wheezing on auscultation, concerning from combination of COPD and CHF exacerbation.  Patient did not take his medications the past 2 days.   At clinic, oxygen saturation 99% on room air, blood pressure 152/102, heart rate 53, RR 24, temperature normal. We are consulted for direct admission from clinic.  Patient is accepted to telemetry bed as inpatient. Pending covid 19 RVP PCR. Pt is started on BiPAP.  Addendum: BMP 4350, negative RVP for Covid test, electrolytes renal function okay, chest x-ray showed cardiomegaly with vascular congestion.  Review of Systems:   General: no fevers, chills, has fatigue HEENT: no blurry vision, hearing changes or sore throat Respiratory: has dyspnea, coughing, wheezing CV: no chest pain, no palpitations GI: no nausea, vomiting, abdominal pain, diarrhea, constipation GU: no dysuria, burning on urination, increased urinary frequency, hematuria  Ext: has leg edema Neuro: no unilateral weakness, numbness, or tingling, no vision change or hearing loss Skin: no rash, no skin tear. MSK: No muscle  spasm, no deformity, no limitation of range of movement in spin Heme: No easy bruising.  Travel history: No recent long distant travel.  Allergy:  Allergies  Allergen Reactions  . [name] [Aspirin]     Past Medical History:  Diagnosis Date  . CHF (congestive heart failure) (HCC)   . COPD (chronic obstructive pulmonary disease) (HCC)   . Diabetes mellitus without complication (HCC)   . Hypertension   . Mental health disorder     Past Surgical History:  Procedure Laterality Date  . NO PAST SURGERIES      Social History:  reports that he has quit smoking. His smoking use included cigarettes. He started smoking about 2 months ago. He has a 35.00 pack-year smoking history. He has never used smokeless tobacco. He reports that he does not drink alcohol or use drugs.  Family History:  Family History  Problem Relation Age of Onset  . CVA Mother   . CAD Father      Prior to Admission medications   Medication Sig Start Date End Date Taking? Authorizing Provider  albuterol (VENTOLIN HFA) 108 (90 Base) MCG/ACT inhaler Inhale 2 puffs into the lungs every 6 (six) hours as needed for wheezing or shortness of breath. 07/08/19   Chesley Noon, MD  amLODipine (NORVASC) 5 MG tablet Take 10 mg by mouth daily.    [provider]  benztropine (COGENTIN) 2 MG tablet Take 2 mg by mouth at bedtime.    [provider]  carvedilol (COREG) 25 MG tablet Take 1 tablet (25 mg total) by mouth 2 (two) times daily with a meal. 11/16/17   Allena Katz,  [place], MD  Cholecalciferol (VITAMIN D3) 5000 units CAPS Take 5,000 Units by mouth daily.    [provider]  dapagliflozin propanediol (FARXIGA) 5 MG TABS tablet Take 5 mg by mouth daily.    [provider]  dextrose (GLUTOSE) 40 % GEL Take by mouth once as needed for low blood sugar. For blood sugar <70    [provider]  diphenhydrAMINE (BENADRYL) 25 mg capsule Take 25 mg by mouth at bedtime.     [provider]    ezetimibe (ZETIA) 10 MG tablet Take 10 mg by mouth daily.    [provider]  fluticasone (FLONASE) 50 MCG/ACT nasal spray Place 1 spray into both nostrils daily.    [provider]  Fluticasone-Umeclidin-Vilant (TRELEGY ELLIPTA) 100-62.5-25 MCG/INH AEPB Inhale 1 puff into the lungs.    [provider]  guaifenesin (ROBITUSSIN) 100 MG/5ML syrup Take 200 mg by mouth every 4 (four) hours as needed for cough.    [provider]  haloperidol decanoate (HALDOL DECANOATE) 100 MG/ML injection Inject 90 mg into the muscle every 28 (twenty-eight) days.     [provider]  isosorbide-hydrALAZINE (BIDIL) 20-37.5 MG tablet Take 1 tablet by mouth 3 (three) times daily.    [provider]  ivabradine (CORLANOR) 5 MG TABS tablet Take 1 tablet (5 mg total) by mouth 2 (two) times daily with a meal. 08/11/19   [name], FNP  metFORMIN (GLUCOPHAGE) 500 MG tablet Take 1,000 mg by mouth 2 (two) times daily with a meal.    [provider]  rosuvastatin (CRESTOR) 40 MG tablet Take 40 mg by mouth daily.    [provider]  sacubitril-valsartan (ENTRESTO) 97-103 MG Take 1 tablet by mouth 2 (two) times daily. 08/04/19   [name], FNP  spironolactone (ALDACTONE) 25 MG tablet Take 0.5 tablets (12.5 mg total) by mouth daily. Patient taking differently: Take 25 mg by mouth daily.  08/28/19 11/26/19  [name], FNP  torsemide (DEMADEX) 20 MG tablet Take 2 tablets (40 mg total) by mouth daily. 08/06/19 11/04/19  [name], FNP    Physical Exam: Vitals:   12/03/19 1148 12/03/19 1206 12/03/19 1213 12/03/19 1227  BP: (!) 157/128     Pulse: 81  (!) 109 68  Resp: (!) 35 (!) 25 (!) 29 (!) 28  Temp:      TempSrc:      SpO2:   100% 98%  Weight:      Height:       General: Not in acute distress HEENT:       Eyes: PERRL, EOMI, no scleral icterus.       ENT: No discharge from the ears and nose, no pharynx injection, no tonsillar  enlargement.        Neck: positive JVD, no bruit, no mass felt. Heme: No neck lymph node enlargement. Cardiac: S1/S2, RRR, No murmurs, No gallops or rubs. Respiratory: Has decreased air movement and mild wheezing bilaterally. GI: Soft, nondistended, nontender, no rebound pain, no organomegaly, BS present. GU: No hematuria Ext: 3+ pitting leg edema bilaterally. 2+DP/PT pulse bilaterally. Musculoskeletal: No joint deformities, No joint redness or warmth, no limitation of ROM in spin. Skin: No rashes.  Neuro: Alert, oriented X3, cranial nerves II-XII grossly intact, moves all extremities normally.   all extremities, sensation to light touch intact. Brachial reflex 2+ bilaterally. Knee reflex 1+ bilaterally. Negative Babinski's sign. Normal finger to nose test. Psych: Patient is not psychotic, no suicidal  or hemocidal ideation.  Labs on Admission: I have personally reviewed following labs and imaging studies  CBC: No results for input(s): WBC, NEUTROABS, HGB, HCT, MCV, PLT in the last 168 hours. Basic Metabolic Panel: No results for input(s): NA, K, CL, CO2, GLUCOSE, BUN, CREATININE, CALCIUM, MG, PHOS in the last 168 hours. GFR: CrCl cannot be calculated (Patient's most recent lab result is older than the maximum 21 days allowed.). Liver Function Tests: No results for input(s): AST, ALT, ALKPHOS, BILITOT, PROT, ALBUMIN in the last 168 hours. No results for input(s): LIPASE, AMYLASE in the last 168 hours. No results for input(s): AMMONIA in the last 168 hours. Coagulation Profile: No results for input(s): INR, PROTIME in the last 168 hours. Cardiac Enzymes: No results for input(s): CKTOTAL, CKMB, CKMBINDEX, TROPONINI in the last 168 hours. BNP (last 3 results) No results for input(s): PROBNP in the last 8760 hours. HbA1C: No results for input(s): HGBA1C in the last 72 hours. CBG: Recent Labs  Lab 12/03/19 1127  GLUCAP 172*   Lipid Profile: No results for input(s): CHOL, HDL,  LDLCALC, TRIG, CHOLHDL, LDLDIRECT in the last 72 hours. Thyroid Function Tests: No results for input(s): TSH, T4TOTAL, FREET4, T3FREE, THYROIDAB in the last 72 hours. Anemia Panel: No results for input(s): VITAMINB12, FOLATE, FERRITIN, TIBC, IRON, RETICCTPCT in the last 72 hours. Urine analysis:    Component Value Date/Time   COLORURINE YELLOW (A) 11/16/2017 0009   APPEARANCEUR CLEAR (A) 11/16/2017 0009   LABSPEC 1.034 (H) 11/16/2017 0009   PHURINE 6.0 11/16/2017 0009   GLUCOSEU NEGATIVE 11/16/2017 0009   HGBUR NEGATIVE 11/16/2017 0009   BILIRUBINUR NEGATIVE 11/16/2017 0009   KETONESUR NEGATIVE 11/16/2017 0009   PROTEINUR NEGATIVE 11/16/2017 0009   NITRITE NEGATIVE 11/16/2017 0009   LEUKOCYTESUR NEGATIVE 11/16/2017 0009   Sepsis Labs: @LABRCNTIP (procalcitonin:4,lacticidven:4) )No results found for this or any previous visit (from the past 240 hour(s)).   Radiological Exams on Admission: No results found.   EKG: Not done yet, will get one.   Assessment/Plan Principal Problem:   Acute on chronic systolic CHF (congestive heart failure) (HCC) Active Problems:   COPD exacerbation (HCC)   Schizophrenia (HCC)   Hyperlipidemia, unspecified   Hypertension   Diabetes mellitus without complication (HCC)   Acute on chronic systolic CHF (congestive heart failure) (HCC): 2D echo on 11/30/2018 showed EF of 20-25%.  Patient has elevated BNP 4350, 3+ leg edema bilaterally, chest x-ray with cardiomegaly and vascular testing, consistent with CHF exacerbation.  -will admit to tele bed as inpt. -Lasix 60 mg bid by IV -2d echo -Daily weights -strict I/O's -Low salt diet -Fluid restriction -on Entresto and bidil  COPD exacerbation (HCC): -Bronchodilators -As needed Mucinex -Solu-Medrol 60 mg twice daily  Schizophrenia (HCC): -Patient is haldol injection -Benztropine -ativan prn  Hyperlipidemia, unspecified: -crestor and zetia  HTN:  -Continue home medications: Amlodipine,  Coreg -hydralazine prn  Diabetes mellitus without complication (HCC): Most recent A1c 8.3 on 11/29/18, poorly controled. Patient is taking 01/28/19, Metformin, Victoza at home -SSI   Inpatient status:  # Patient requires inpatient status due to high intensity of service, high risk for further deterioration and high frequency of surveillance required.  I certify that at the point of admission it is my clinical judgment that the patient will require inpatient hospital care spanning beyond 2 midnights from the point of admission.  . This patient has multiple chronic comorbidities including sCHF with EF 20-25%, hypertension, diabetes mellitus, COPD, schizophrenia . Now patient has presenting with worsening shortness  of breath due to combination of COPD and sCHF exacerbation . The worrisome physical exam findings include 3+ bilateral leg edema, positive DVT, decreased air movement and mild wheezing on auscultation . Current medical needs: please see my assessment and plan . Predictability of an adverse outcome (risk): Patient has multiple comorbidities as listed above. Now presents with worsening shortness of breath due to combination of COPD and sCHF exacerbation. Pt has low EF of 20-25%. Need BiPAP. Patient's presentation is highly complicated.  Patient is at high risk of deteriorating.  Will need to be treated in hospital for at least 2 days.             DVT ppx:  SQ Lovenox Code Status: Full code Family Communication: None at bed side.    Disposition Plan:  Anticipate discharge back to previous home environment Consults called:  none Admission status:  Tele bed as inpt  Date of Service 12/03/2019    Lorretta Harp [facility]   If 7PM-7AM, please contact night-coverage [email or web address] 12/03/2019, 1:10 PM

## 2019-12-03 NOTE — Progress Notes (Signed)
Assisting primary RN with admission. While doing admission patient seemed to very SOB and labored. Per Consulting civil engineer RT had assessed patient and placed on 6L Gu[place]. RR 35. Dr. Clyde Lundborg notified and placed orders for bipap and 60 mg of IV lasix. IV est by this RN and lasix given. bipap placed by RT, and rapid covid test completed. MD at bedside to assess patient. Patient alert and oriented and updated on plan of care. Primary RN, Brett Canales, updated. Patient placed on airborne/contact precautions while covid test pending. Gery Pray

## 2019-12-03 NOTE — Progress Notes (Signed)
Called ICU RN and gave transfer report. All questions answered. Will transport momentarily. Will tube any available medications. Will remove isolation caddy. Jari Favre [facility]

## 2019-12-03 NOTE — Progress Notes (Signed)
Arrived to room, IV site has been established by floor nurse. Please re-enter consult if incompatible meds are ordered.

## 2019-12-04 ENCOUNTER — Inpatient Hospital Stay (HOSPITAL_COMMUNITY)
Admission: AD | Admit: 2019-12-04 | Discharge: 2019-12-04 | Disposition: A | Discharge status: Home / Self Care | Source: Ambulatory Visit | Attending: Internal Medicine | Admitting: Internal Medicine

## 2019-12-04 DIAGNOSIS — I5023 Acute on chronic systolic (congestive) heart failure: Secondary | ICD-10-CM

## 2019-12-04 DIAGNOSIS — I5031 Acute diastolic (congestive) heart failure: Secondary | ICD-10-CM

## 2019-12-04 LAB — BASIC METABOLIC PANEL
Anion gap: 10 (ref 5–15)
BUN: 19 mg/dL (ref 6–20)
CO2: 40 mmol/L — ABNORMAL HIGH (ref 22–32)
Calcium: 8.8 mg/dL — ABNORMAL LOW (ref 8.9–10.3)
Chloride: 92 mmol/L — ABNORMAL LOW (ref 98–111)
Creatinine, Ser: 1.05 mg/dL (ref 0.61–1.24)
GFR calc Af Amer: >60 mL/min (ref >60)
GFR calc non Af Amer: >60 mL/min (ref >60)
Glucose, Bld: 200 mg/dL — ABNORMAL HIGH (ref 70–99)
Potassium: 4.5 mmol/L (ref 3.5–5.1)
Sodium: 142 mmol/L (ref 135–145)

## 2019-12-04 LAB — GLUCOSE, CAPILLARY
Glucose-Capillary: 173 mg/dL — ABNORMAL HIGH (ref 70–99)
Glucose-Capillary: 116 mg/dL — ABNORMAL HIGH (ref 70–99)
Glucose-Capillary: 240 mg/dL — ABNORMAL HIGH (ref 70–99)
Glucose-Capillary: 176 mg/dL — ABNORMAL HIGH (ref 70–99)

## 2019-12-04 LAB — HIV ANTIBODY (ROUTINE TESTING W REFLEX): HIV Screen 4th Generation wRfx: NONREACTIVE

## 2019-12-04 LAB — ECHOCARDIOGRAM COMPLETE
Height: 72 in
Weight: 3834.24 oz

## 2019-12-04 LAB — MAGNESIUM: Magnesium: 2 mg/dL (ref 1.7–2.4)

## 2019-12-04 MED ORDER — ADULT MULTIVITAMIN W/MINERALS CH
1.0000 | ORAL_TABLET | Freq: Every day | ORAL | Status: DC
  Administered 2019-12-04 – 2019-12-10 (×7): 1 via ORAL
  Filled 2019-12-04 (×7): qty 1

## 2019-12-04 MED ORDER — METHYLPREDNISOLONE SODIUM SUCC 40 MG IJ SOLR
40.0000 mg | Freq: Two times a day (BID) | INTRAMUSCULAR | Status: DC
  Administered 2019-12-04 – 2019-12-06 (×4): 40 mg via INTRAVENOUS
  Filled 2019-12-04 (×4): qty 1

## 2019-12-04 MED ORDER — FOLIC ACID 1 MG PO TABS
1.0000 mg | ORAL_TABLET | Freq: Every day | ORAL | Status: DC
  Administered 2019-12-04 – 2019-12-10 (×7): 1 mg via ORAL
  Filled 2019-12-04 (×7): qty 1

## 2019-12-04 MED ORDER — LEVOFLOXACIN 500 MG PO TABS
500.0000 mg | ORAL_TABLET | ORAL | Status: AC
  Administered 2019-12-04 – 2019-12-08 (×5): 500 mg via ORAL
  Filled 2019-12-04 (×6): qty 1

## 2019-12-04 MED ORDER — PERFLUTREN LIPID MICROSPHERE
1.0000 mL | INTRAVENOUS | Status: AC | PRN
  Administered 2019-12-04: 2 mL via INTRAVENOUS
  Filled 2019-12-04: qty 10

## 2019-12-04 MED ORDER — THIAMINE HCL 100 MG PO TABS
100.0000 mg | ORAL_TABLET | Freq: Every day | ORAL | Status: DC
  Administered 2019-12-04 – 2019-12-10 (×7): 100 mg via ORAL
  Filled 2019-12-04 (×7): qty 1

## 2019-12-04 MED ORDER — INSULIN GLARGINE 100 UNIT/ML ~~LOC~~ SOLN
10.0000 [IU] | Freq: Every day | SUBCUTANEOUS | Status: DC
  Administered 2019-12-04 – 2019-12-05 (×2): 10 [IU] via SUBCUTANEOUS
  Filled 2019-12-04 (×3): qty 0.1

## 2019-12-04 NOTE — Progress Notes (Signed)
PROGRESS NOTE    Paul Hernandez  UVO:[id number] DOB: 1966-01-27 DOA: 12/03/2019 PCP: [name], FNP    Brief Narrative:  HPI: Paul Hernandez is a 54 y.o. male with medical history significant of sCHF with EF 20-25%, hypertension, diabetes mellitus, COPD, schizophrenia, who presents with shortness of breath. This is direct admission from clinic, NP Anderson Malta.  Patient states that he has worsening shortness breath since yesterday, which has been progressively worsening.  He has mild dry cough. He denies fever, chills, chest pain.  Patient denies nausea, vomiting, diarrhea, abdominal pain, symptoms of UTI or unilateral weakness. Pt was seen by NP, Anderson Malta at the clinic, an was found to have bilateral leg edema and mild wheezing on auscultation, concerning from combination of COPD and CHF exacerbation.  Patient did not take his medications the past 2 days.   At clinic, oxygen saturation 99% on room air, blood pressure 152/102, heart rate 53, RR 24, temperature normal. We are consulted for direct admission from clinic.  Patient is accepted to telemetry bed as inpatient. Pending covid 19 RVP PCR. Pt is started on BiPAP  2/11: Patient was on BiPAP until early this morning.  Weaned off by respiratory therapy.  Tolerating well.  On 3 L nasal cannula this morning.  Diuresing well.  Patient did have an episode of SVT with ventricular rate up to 170.  Sustained for approximately 30 seconds then reverted back to normal sinus rhythm without intervention.  ICU consulted, discussed with ICU attending   Assessment & Plan:   Principal Problem:   Acute on chronic systolic CHF (congestive heart failure) (HCC) Active Problems:   COPD exacerbation (HCC)   Schizophrenia (HCC)   Hyperlipidemia, unspecified   Hypertension   Diabetes mellitus without complication ([place])  Acute on chronic systolic CHF (congestive heart failure) ([place]):  2D echo on 11/30/2018 showed EF of 20-25%.   Patient has elevated BNP  4350, 3+ leg edema bilaterally chest x-ray with cardiomegaly and vascular testing consistent with CHF exacerbation. -Weaned off BiPAP for now -Remains in stepdown status Plan: Continue stepdown status for now Continue IV Lasix 60 mg twice daily Follow-up 2D echocardiogram Daily weights Strict I's and O's Low-sodium diet 1500 cc fluid restriction Target net -1-1.5L per day Continue Entresto Continue Coreg Will consider cardiology consultation should patient not be appropriately diuresing  COPD exacerbation ([place]): -Bronchodilators -As needed Mucinex -Solu-Medrol 60 mg twice daily, wean aggressively if respiratory status improves  Schizophrenia ([place]): -Patient is haldol injection -Benztropine -ativan prn  Hyperlipidemia, unspecified: -crestor and zetia  HTN:  -Continue home medications  Amlodipine, Coreg -hydralazine prn  Diabetes mellitus without complication ([place]) Most recent A1c 8.3 on 11/29/18, poorly controled.  Patient is taking [place], Metformin, Victoza at home Plan: All oral agents on hold Lantus 10 units daily Sliding scale coverage   DVT prophylaxis: Lovenox Code Status: Full Family Communication: None Disposition Plan: Home, anticipate 24 to 48 hours once respiratory status stabilized patient euvolemic  Consultants:   Intensivist  Procedures:   None  Antimicrobials:   None   Subjective: Patient seen and examined Weaned off BiPAP, on 3 L nasal cannula Difficult to understand Repeatedly requests food and water  Objective: Vitals:   12/04/19 0823 12/04/19 1000 12/04/19 1100 12/04/19 1200  BP:  (!) 85/67 91/70 (!) 90/55  Pulse: 86 67 65 76  Resp:  (!) 22    Temp:      TempSrc:      SpO2: 97% 92% 94% 94%  Weight:  Height:        Intake/Output Summary (Last 24 hours) at 12/04/2019 1355 Last data filed at 12/04/2019 1128 Gross per 24 hour  Intake --  Output 2800 ml  Net -2800 ml   Filed Weights   12/03/19 1123 12/03/19  1435 12/04/19 0500  Weight: 112.2 kg 110.2 kg 108.7 kg    Examination:  General exam: Appears calm and comfortable  Respiratory system: Decreased air movement, scattered mild wheeze, normal respiratory effort, bibasilar crackles Cardiovascular system: S1-S2, no murmurs, 3+ pitting edema bilateral, positive JVD gastrointestinal system: Abdomen is nondistended, soft and nontender. No organomegaly or masses felt. Normal bowel sounds heard. Central nervous system: Alert and oriented. No focal neurological deficits. Extremities: Symmetric 5 x 5 power. Skin: No rashes, lesions or ulcers Psychiatry: Judgement and insight appear normal. Mood & affect appropriate.     Data Reviewed: I have personally reviewed following labs and imaging studies  CBC: Recent Labs  Lab 12/03/19 1256  WBC 8.4  HGB 15.4  HCT 52.4*  MCV 89.7  PLT 308   Basic Metabolic Panel: Recent Labs  Lab 12/03/19 1256 12/04/19 0403  NA 141 142  K 4.7 4.5  CL 96* 92*  CO2 34* 40*  GLUCOSE 187* 200*  BUN 17 19  CREATININE 0.97 1.05  CALCIUM 8.9 8.8*  MG  --  2.0   GFR: Estimated Creatinine Clearance: 104.8 mL/min (by C-G formula based on SCr of 1.05 mg/dL). Liver Function Tests: No results for input(s): AST, ALT, ALKPHOS, BILITOT, PROT, ALBUMIN in the last 168 hours. No results for input(s): LIPASE, AMYLASE in the last 168 hours. No results for input(s): AMMONIA in the last 168 hours. Coagulation Profile: No results for input(s): INR, PROTIME in the last 168 hours. Cardiac Enzymes: No results for input(s): CKTOTAL, CKMB, CKMBINDEX, TROPONINI in the last 168 hours. BNP (last 3 results) No results for input(s): PROBNP in the last 8760 hours. HbA1C: No results for input(s): HGBA1C in the last 72 hours. CBG: Recent Labs  Lab 12/03/19 1127 12/03/19 1437 12/03/19 2121 12/04/19 0712 12/04/19 1113  GLUCAP 172* 141* 232* 173* 116*   Lipid Profile: No results for input(s): CHOL, HDL, LDLCALC, TRIG,  CHOLHDL, LDLDIRECT in the last 72 hours. Thyroid Function Tests: No results for input(s): TSH, T4TOTAL, FREET4, T3FREE, THYROIDAB in the last 72 hours. Anemia Panel: No results for input(s): VITAMINB12, FOLATE, FERRITIN, TIBC, IRON, RETICCTPCT in the last 72 hours. Sepsis Labs: No results for input(s): PROCALCITON, LATICACIDVEN in the last 168 hours.  Recent Results (from the past 240 hour(s))  MRSA PCR Screening     Status: None   Collection Time: 12/03/19 12:24 PM   Specimen: Nasal Mucosa; Nasopharyngeal  Result Value Ref Range Status   MRSA by PCR NEGATIVE NEGATIVE Final    Comment:        The GeneXpert MRSA Assay (FDA approved for NASAL specimens only), is one component of a comprehensive MRSA colonization surveillance program. It is not intended to diagnose MRSA infection nor to guide or monitor treatment for MRSA infections. Performed at [facility], [address] [place], [place] [address]   Respiratory Panel by RT PCR (Flu A&B, Covid) - Nasopharyngeal Swab     Status: None   Collection Time: 12/03/19 12:24 PM   Specimen: Nasopharyngeal Swab  Result Value Ref Range Status   SARS Coronavirus 2 by RT PCR NEGATIVE NEGATIVE Final    Comment: (NOTE) SARS-CoV-2 target nucleic acids are NOT DETECTED. The SARS-CoV-2 RNA is generally  detectable in upper respiratoy specimens during the acute phase of infection. The lowest concentration of SARS-CoV-2 viral copies this assay can detect is 131 copies/mL. A negative result does not preclude SARS-Cov-2 infection and should not be used as the sole basis for treatment or other patient management decisions. A negative result may occur with  improper specimen collection/handling, submission of specimen other than nasopharyngeal swab, presence of viral mutation(s) within the areas targeted by this assay, and inadequate number of viral copies (<131 copies/mL). A negative result must be combined with clinical observations,  patient history, and epidemiological information. The expected result is Negative. Fact Sheet for Patients:  https://www.moore.com/ Fact Sheet for Healthcare Providers:  https://www.young.biz/ This test is not yet ap proved or cleared by the [place] FDA and  has been authorized for detection and/or diagnosis of SARS-CoV-2 by FDA under an Emergency Use Authorization (EUA). This EUA will remain  in effect (meaning this test can be used) for the duration of the COVID-19 declaration under Section 564(b)(1) of the Act, 21 U.S.C. section 360bbb-3(b)(1), unless the authorization is terminated or revoked sooner.    Influenza A by PCR NEGATIVE NEGATIVE Final   Influenza B by PCR NEGATIVE NEGATIVE Final    Comment: (NOTE) The Xpert Xpress SARS-CoV-2/FLU/RSV assay is intended as an aid in  the diagnosis of influenza from Nasopharyngeal swab specimens and  should not be used as a sole basis for treatment. Nasal washings and  aspirates are unacceptable for Xpert Xpress SARS-CoV-2/FLU/RSV  testing. Fact Sheet for Patients: https://www.moore.com/ Fact Sheet for Healthcare Providers: https://www.young.biz/ This test is not yet approved or cleared by the [place] FDA and  has been authorized for detection and/or diagnosis of SARS-CoV-2 by  FDA under an Emergency Use Authorization (EUA). This EUA will remain  in effect (meaning this test can be used) for the duration of the  Covid-19 declaration under Section 564(b)(1) of the Act, 21  U.S.C. section 360bbb-3(b)(1), unless the authorization is  terminated or revoked. Performed at [facility], [address] [place], [place] [address]          Radiology Studies: [facility] Chest [place] 1 View  Result Date: 12/03/2019 CLINICAL DATA:  Shortness of breath EXAM: PORTABLE CHEST 1 VIEW COMPARISON:  07/08/2019 FINDINGS: Cardiomegaly with vascular congestion.  Small right pleural effusion. Right basilar atelectasis or infiltrate. Left lung clear. No edema. No acute bony abnormality. IMPRESSION: Cardiomegaly with vascular congestion. Small right effusion with right base atelectasis or infiltrate. Electronically Signed   By: Charlett Nose M.D.   On: 12/03/2019 15:06        Scheduled Meds: . amLODipine  10 mg Oral Daily  . budesonide  1 puff Inhalation TID  . carvedilol  25 mg Oral BID WC  . chlorhexidine  15 mL Mouth Rinse BID  . Chlorhexidine Gluconate Cloth  6 each Topical Daily  . cholecalciferol  5,000 Units Oral Daily  . dextromethorphan-guaiFENesin  1 tablet Oral BID  . enoxaparin (LOVENOX) injection  40 mg Subcutaneous Q24H  . ezetimibe  10 mg Oral Daily  . fluticasone  1 spray Each Nare Daily  . folic acid  1 mg Oral Daily  . furosemide  60 mg Intravenous Q12H  . insulin aspart  0-5 Units Subcutaneous QHS  . insulin aspart  0-9 Units Subcutaneous TID WC  . insulin glargine  10 Units Subcutaneous Daily  . ipratropium-albuterol  3 mL Nebulization Q6H  . isosorbide-hydrALAZINE  1 tablet Oral TID  . ivabradine  5 mg Oral BID WC  . mouth rinse  15 mL Mouth Rinse q12n4p  . methylPREDNISolone (SOLU-MEDROL) injection  60 mg Intravenous Q12H  . multivitamin with minerals  1 tablet Oral Daily  . rosuvastatin  40 mg Oral Daily  . sacubitril-valsartan  1 tablet Oral BID  . sodium chloride flush  3 mL Intravenous Q12H  . thiamine  100 mg Oral Daily   Continuous Infusions: . sodium chloride       LOS: 1 day    Time spent: 35 minutes    Tresa Moore, MD [facility] Pager [phone]  If 7PM-7AM, please contact night-coverage  12/04/2019, 1:55 PM

## 2019-12-04 NOTE — Progress Notes (Signed)
Pt did not want to use the chest vest at this time.

## 2019-12-04 NOTE — Consult Note (Signed)
CRITICAL CARE PROGRESS NOTE    Name: Paul Hernandez MRN: [id number] DOB: 30-Dec-1965     LOS: 1 Referring physician: Dr Georgeann Oppenheim     SUBJECTIVE FINDINGS & SIGNIFICANT EVENTS   Patient description:  Patient with advanced systolic CHF with EF of 20 to 83%, schizoaffective disorder, lifelong smoking with COPD admitted due to progressively worsening dyspnea and shortness of breath found to have severe lower extremity edema as well as phlegm production and oxygen desaturation suggestive of concomitant COPD with CHF exacerbation.  Patient was initially brought to the unit with a stepdown service on hospitalist team however was noted to have tachycardic episodes with narrow complex tachyarrhythmia over 150 BMP which prompted critical care consultation   Lines / Drains: PIVx2  Cultures / Sepsis markers: COVID negative Flu A & B negative  Antibiotics: Levoquin 500 po daily for AECOPD   Protocols / Consultants: PCCM and Hospitalist    Events:  12/04/19 - SVT episodes >160HR, self resolved after few minutes   PAST MEDICAL HISTORY   Past Medical History:  Diagnosis Date  . CHF (congestive heart failure) (HCC)   . COPD (chronic obstructive pulmonary disease) (HCC)   . Diabetes mellitus without complication (HCC)   . Hypertension   . Mental health disorder      SURGICAL HISTORY   Past Surgical History:  Procedure Laterality Date  . NO PAST SURGERIES       FAMILY HISTORY   Family History  Problem Relation Age of Onset  . CVA Mother   . CAD Father      SOCIAL HISTORY   Social History   Tobacco Use  . Smoking status: Former Smoker    Packs/day: 1.00    Years: 35.00    Pack years: 35.00    Types: Cigarettes    Start date: 09/07/2019  . Smokeless tobacco: Never Used  Substance Use  Topics  . Alcohol use: No  . Drug use: No     MEDICATIONS   Current Medication:  Current Facility-Administered Medications:  .  0.9 %  sodium chloride infusion, 250 mL, Intravenous, PRN, Lorretta Harp, MD .  acetaminophen (TYLENOL) tablet 650 mg, 650 mg, Oral, Q6H PRN, Lorretta Harp, MD .  albuterol (PROVENTIL) (2.5 MG/3ML) 0.083% nebulizer solution 2.5 mg, 2.5 mg, Nebulization, Q4H PRN, Bertram Savin, RPH, 2.5 mg at 12/03/19 1704 .  amLODipine (NORVASC) tablet 10 mg, 10 mg, Oral, Daily, Lorretta Harp, MD .  benztropine (COGENTIN) tablet 2 mg, 2 mg, Oral, QHS PRN, Lorretta Harp, MD, 2 mg at 12/03/19 2230 .  budesonide (PULMICORT) 180 MCG/ACT inhaler 1 puff, 1 puff, Inhalation, TID, Lorretta Harp, MD .  carvedilol (COREG) tablet 25 mg, 25 mg, Oral, BID WC, Lorretta Harp, MD, 25 mg at 12/04/19 0747 .  chlorhexidine (PERIDEX) 0.12 % solution 15 mL, 15 mL, Mouth Rinse, BID, Lorretta Harp, MD, 15 mL at 12/03/19 2140 .  Chlorhexidine Gluconate Cloth 2 % PADS 6 each, 6 each, Topical, Daily, Lorretta Harp, MD, 6 each at 12/03/19 1506 .  cholecalciferol (VITAMIN D3) tablet 5,000 Units, 5,000 Units, Oral, Daily, Lorretta Harp, MD .  dextromethorphan-guaiFENesin [facility] DM) 30-600 MG per 12 hr tablet 1 tablet, 1 tablet, Oral, BID, Lorretta Harp, MD, 1 tablet at 12/03/19 2143 .  diphenhydrAMINE (BENADRYL) capsule 25 mg, 25 mg, Oral, QHS PRN, Lorretta Harp, MD .  enoxaparin (LOVENOX) injection 40 mg, 40 mg, Subcutaneous, Q24H, Lorretta Harp, MD, 40 mg at 12/03/19 1705 .  ezetimibe (ZETIA) tablet 10 mg, 10  mg, Oral, Daily, Ivor Costa, MD .  fluticasone (FLONASE) 50 MCG/ACT nasal spray 1 spray, 1 spray, Each Nare, Daily, Ivor Costa, MD .  furosemide (LASIX) injection 60 mg, 60 mg, Intravenous, Q12H, Ivor Costa, MD, 60 mg at 12/04/19 0743 .  Glycopyrrolate SOLN 25 mcg, 25 mcg, Inhalation, BID, Ivor Costa, MD .  hydrALAZINE (APRESOLINE) injection 10 mg, 10 mg, Intravenous, Q4H PRN, Sharion Settler, NP, 10 mg at 12/03/19 2333 .   HYDROmorphone (DILAUDID) injection 1 mg, 1 mg, Intravenous, Q3H PRN, Ivor Costa, MD, 1 mg at 12/03/19 2145 .  insulin aspart (novoLOG) injection 0-5 Units, 0-5 Units, Subcutaneous, QHS, Ivor Costa, MD, 2 Units at 12/03/19 2144 .  insulin aspart (novoLOG) injection 0-9 Units, 0-9 Units, Subcutaneous, TID WC, Ivor Costa, MD, 2 Units at 12/04/19 0746 .  insulin glargine (LANTUS) injection 10 Units, 10 Units, Subcutaneous, Daily, Sreenath, Sudheer B, MD .  ipratropium-albuterol (DUONEB) 0.5-2.5 (3) MG/3ML nebulizer solution 3 mL, 3 mL, Nebulization, Q6H, Charlett Nose, RPH, 3 mL at 12/04/19 0821 .  isosorbide-hydrALAZINE (BIDIL) 20-37.5 MG per tablet 1 tablet, 1 tablet, Oral, TID, Ivor Costa, MD, 1 tablet at 12/03/19 2229 .  ivabradine (CORLANOR) tablet 5 mg, 5 mg, Oral, BID WC, Ivor Costa, MD, 5 mg at 12/04/19 0747 .  LORazepam (ATIVAN) tablet 0.5 mg, 0.5 mg, Oral, Q4H PRN, Ivor Costa, MD .  MEDLINE mouth rinse, 15 mL, Mouth Rinse, q12n4p, Ivor Costa, MD, 15 mL at 12/03/19 1506 .  methylPREDNISolone sodium succinate (SOLU-MEDROL) 125 mg/2 mL injection 60 mg, 60 mg, Intravenous, Q12H, Ivor Costa, MD, 60 mg at 12/03/19 2139 .  morphine CONCENTRATE 10 MG/0.5ML oral solution 10 mg, 10 mg, Oral, Q4H PRN, Ivor Costa, MD .  ondansetron [facility]) injection 4 mg, 4 mg, Intravenous, Q8H PRN, Ivor Costa, MD .  rosuvastatin (CRESTOR) tablet 40 mg, 40 mg, Oral, Daily, Ivor Costa, MD .  sacubitril-valsartan (ENTRESTO) 97-103 mg per tablet, 1 tablet, Oral, BID, Ivor Costa, MD, 1 tablet at 12/03/19 2332 .  sodium chloride flush (NS) 0.9 % injection 3 mL, 3 mL, Intravenous, Q12H, Ivor Costa, MD, 3 mL at 12/03/19 2140 .  sodium chloride flush (NS) 0.9 % injection 3 mL, 3 mL, Intravenous, PRN, Ivor Costa, MD  Facility-Administered Medications Ordered in Other Encounters:  .  perflutren lipid microspheres (DEFINITY) IV suspension, 1-10 mL, Intravenous, PRN, Ivor Costa, MD, 2 mL at 12/04/19 0919    ALLERGIES    Asa [aspirin]    REVIEW OF SYSTEMS     10 point ROS conducted and is negative except as per subjective findings  PHYSICAL EXAMINATION   Vital Signs: Temp:  [97.9 F (36.6 C)-99.7 F (37.6 C)] 98 F (36.7 C) (02/11 0800) Pulse Rate:  [29-117] 86 (02/11 0823) Resp:  [11-35] 13 (02/11 0800) BP: (100-164)/(76-141) 106/91 (02/11 0800) SpO2:  [73 %-100 %] 97 % (02/11 0823) FiO2 (%):  [40 %] 40 % (02/11 0821) Weight:  [108.7 kg-112.2 kg] 108.7 kg (02/11 0500)  GENERAL: Chronically ill-appearing HEAD: Normocephalic, atraumatic.  EYES: Pupils equal, round, reactive to light.  No scleral icterus.  MOUTH: Moist mucosal membrane. NECK: Supple. No thyromegaly. No nodules. No JVD.  PULMONARY: Decreased breath sounds at the bases bilaterally CARDIOVASCULAR: S1 and S2. Regular rate and rhythm. No murmurs, rubs, or gallops.  GASTROINTESTINAL: Soft, nontender, non-distended. No masses. Positive bowel sounds. No hepatosplenomegaly.  MUSCULOSKELETAL: 2+ lower extremity edema with mild clubbing NEUROLOGIC: Mild distress due to acute illness SKIN:intact,warm,dry   PERTINENT DATA  Infusions: . sodium chloride     Scheduled Medications: . amLODipine  10 mg Oral Daily  . budesonide  1 puff Inhalation TID  . carvedilol  25 mg Oral BID WC  . chlorhexidine  15 mL Mouth Rinse BID  . Chlorhexidine Gluconate Cloth  6 each Topical Daily  . cholecalciferol  5,000 Units Oral Daily  . dextromethorphan-guaiFENesin  1 tablet Oral BID  . enoxaparin (LOVENOX) injection  40 mg Subcutaneous Q24H  . ezetimibe  10 mg Oral Daily  . fluticasone  1 spray Each Nare Daily  . furosemide  60 mg Intravenous Q12H  . Glycopyrrolate  25 mcg Inhalation BID  . insulin aspart  0-5 Units Subcutaneous QHS  . insulin aspart  0-9 Units Subcutaneous TID WC  . insulin glargine  10 Units Subcutaneous Daily  . ipratropium-albuterol  3 mL Nebulization Q6H  . isosorbide-hydrALAZINE  1 tablet Oral TID  . ivabradine   5 mg Oral BID WC  . mouth rinse  15 mL Mouth Rinse q12n4p  . methylPREDNISolone (SOLU-MEDROL) injection  60 mg Intravenous Q12H  . rosuvastatin  40 mg Oral Daily  . sacubitril-valsartan  1 tablet Oral BID  . sodium chloride flush  3 mL Intravenous Q12H   PRN Medications: sodium chloride, acetaminophen, albuterol, benztropine, diphenhydrAMINE, hydrALAZINE, HYDROmorphone (DILAUDID) injection, LORazepam, morphine CONCENTRATE, ondansetron (ZOFRAN) IV, sodium chloride flush Hemodynamic parameters:   Intake/Output: 02/10 0701 - 02/11 0700 In: -  Out: 1690 [Urine:1690]  Ventilator  Settings: FiO2 (%):  [40 %] 40 %    LAB RESULTS:  Basic Metabolic Panel: Recent Labs  Lab 12/03/19 1256 12/04/19 0403  NA 141 142  K 4.7 4.5  CL 96* 92*  CO2 34* 40*  GLUCOSE 187* 200*  BUN 17 19  CREATININE 0.97 1.05  CALCIUM 8.9 8.8*  MG  --  2.0   Liver Function Tests: No results for input(s): AST, ALT, ALKPHOS, BILITOT, PROT, ALBUMIN in the last 168 hours. No results for input(s): LIPASE, AMYLASE in the last 168 hours. No results for input(s): AMMONIA in the last 168 hours. CBC: Recent Labs  Lab 12/03/19 1256  WBC 8.4  HGB 15.4  HCT 52.4*  MCV 89.7  PLT 308   Cardiac Enzymes: No results for input(s): CKTOTAL, CKMB, CKMBINDEX, TROPONINI in the last 168 hours. BNP: Invalid input(s): POCBNP CBG: Recent Labs  Lab 12/03/19 1127 12/03/19 1437 12/03/19 2121 12/04/19 0712  GLUCAP 172* 141* 232* 173*     IMAGING RESULTS:  Imaging: DG Chest Port 1 View  Result Date: 12/03/2019 CLINICAL DATA:  Shortness of breath EXAM: PORTABLE CHEST 1 VIEW COMPARISON:  07/08/2019 FINDINGS: Cardiomegaly with vascular congestion. Small right pleural effusion. Right basilar atelectasis or infiltrate. Left lung clear. No edema. No acute bony abnormality. IMPRESSION: Cardiomegaly with vascular congestion. Small right effusion with right base atelectasis or infiltrate. Electronically Signed   By: Charlett Nose M.D.   On: 12/03/2019 15:06      ASSESSMENT AND PLAN    -Multidisciplinary rounds held today  Acute Hypoxic Respiratory Failure -due to acute decompensated systolic CHF with EF 20 to 25% and concurrent moderate to severe acute COPD exacerbation -continue BiPAP support as needed -continue Bronchodilator Therapy -Continue Levaquin 500 p.o. daily -Aggressive bronchopulmonary hygiene-we will initiate vest therapy with incentive spirometer and Acapella device   Acute decompensated systolic CHF with EF of 20 to 40% -Clinically improved status post diuresis with net 3600 mL diuresis thus far -Agree with current Lasix 60 IV twice daily -  Strict I's and O's-please document urine output -Fluid restriction of 1200 cc per 24 hours -PT OT -BiPAP nightly ICU telemetry monitoring     Narrow complex tachyarrhythmia  -Patient had multiple episodes of SVT which have self resolved  -Continue ICU telemetry monitoring  -Immediate electrolyte repletion while on aggressive diuresis -Cardiac biomarkers -Repeat echo completed today-final report pending   Extensive psychiatric history -Schizophrenia -Continue home regimen -Consider psychiatry evaluation if necessary   GI/Nutrition GI PROPHYLAXIS as indicated DIET-->TF's as tolerated Constipation protocol as indicated  ENDO - ICU hypoglycemic\Hyperglycemia protocol -check FSBS per protocol   ELECTROLYTES -follow labs as needed -replace as needed -pharmacy consultation   DVT/GI PRX ordered -SCDs  TRANSFUSIONS AS NEEDED MONITOR FSBS ASSESS the need for LABS as needed   Critical care provider statement:    Critical care time (minutes):  33   Critical care time was exclusive of:  Separately billable procedures and treating other patients   Critical care was necessary to treat or prevent imminent or life-threatening deterioration of the following conditions:   Acute hypoxemic respiratory failure, acute decompensated systolic  CHF exacerbation, acute moderate to severe COPD exacerbation, multiple comorbid conditions   Critical care was time spent personally by me on the following activities:  Development of treatment plan with patient or surrogate, discussions with consultants, evaluation of patient's response to treatment, examination of patient, obtaining history from patient or surrogate, ordering and performing treatments and interventions, ordering and review of laboratory studies and re-evaluation of patient's condition.  I assumed direction of critical care for this patient from another provider in my specialty: no    This document was prepared using Dragon voice recognition software and may include unintentional dictation errors.    Vida Rigger, M.D.  Division of Pulmonary & Critical Care Medicine  [facility]

## 2019-12-04 NOTE — Progress Notes (Signed)
Pt went into SVT at this time with HR 170. Sustained for about 30 seconds and pt went back into NSR on his own. MD aware.

## 2019-12-04 NOTE — Progress Notes (Signed)
*  PRELIMINARY RESULTS* Echocardiogram 2D Echocardiogram has been performed.  Joanette Gula Rane Dumm 12/04/2019, 9:31 AM

## 2019-12-04 NOTE — Clinical Social Work Note (Signed)
Received a call that patient is a hospice patient. Reviewed chart and found notes from late last year that patient was active with [facility] home [facility]. CSW called their liaison and confirmed he is still active. MD is aware.  Charlynn Court, CSW [phone]

## 2019-12-05 ENCOUNTER — Inpatient Hospital Stay: Payer: Self-pay

## 2019-12-05 DIAGNOSIS — I10 Essential (primary) hypertension: Secondary | ICD-10-CM

## 2019-12-05 DIAGNOSIS — J449 Chronic obstructive pulmonary disease, unspecified: Secondary | ICD-10-CM

## 2019-12-05 DIAGNOSIS — E119 Type 2 diabetes mellitus without complications: Secondary | ICD-10-CM

## 2019-12-05 DIAGNOSIS — I509 Heart failure, unspecified: Secondary | ICD-10-CM

## 2019-12-05 DIAGNOSIS — F99 Mental disorder, not otherwise specified: Secondary | ICD-10-CM

## 2019-12-05 HISTORY — DX: Heart failure, unspecified: I50.9

## 2019-12-05 HISTORY — DX: Mental disorder, not otherwise specified: F99

## 2019-12-05 HISTORY — DX: Chronic obstructive pulmonary disease, unspecified: J44.9

## 2019-12-05 HISTORY — DX: Essential (primary) hypertension: I10

## 2019-12-05 HISTORY — DX: Type 2 diabetes mellitus without complications: E11.9

## 2019-12-05 LAB — PHOSPHORUS: Phosphorus: 5.2 mg/dL — ABNORMAL HIGH (ref 2.5–4.6)

## 2019-12-05 LAB — BASIC METABOLIC PANEL
Anion gap: 8 (ref 5–15)
BUN: 29 mg/dL — ABNORMAL HIGH (ref 6–20)
CO2: 37 mmol/L — ABNORMAL HIGH (ref 22–32)
Calcium: 7.8 mg/dL — ABNORMAL LOW (ref 8.9–10.3)
Chloride: 92 mmol/L — ABNORMAL LOW (ref 98–111)
Creatinine, Ser: 1.02 mg/dL (ref 0.61–1.24)
GFR calc Af Amer: >60 mL/min (ref >60)
GFR calc non Af Amer: >60 mL/min (ref >60)
Glucose, Bld: 201 mg/dL — ABNORMAL HIGH (ref 70–99)
Potassium: 4.4 mmol/L (ref 3.5–5.1)
Sodium: 137 mmol/L (ref 135–145)

## 2019-12-05 LAB — GLUCOSE, CAPILLARY
Glucose-Capillary: 204 mg/dL — ABNORMAL HIGH (ref 70–99)
Glucose-Capillary: 321 mg/dL — ABNORMAL HIGH (ref 70–99)
Glucose-Capillary: 237 mg/dL — ABNORMAL HIGH (ref 70–99)
Glucose-Capillary: 179 mg/dL — ABNORMAL HIGH (ref 70–99)

## 2019-12-05 LAB — MAGNESIUM: Magnesium: 1.9 mg/dL (ref 1.7–2.4)

## 2019-12-05 MED ORDER — FUROSEMIDE 10 MG/ML IJ SOLN
40.0000 mg | Freq: Two times a day (BID) | INTRAMUSCULAR | Status: DC
  Administered 2019-12-05: 21:00:00 40 mg via INTRAVENOUS
  Filled 2019-12-05 (×2): qty 4

## 2019-12-05 MED ORDER — MAGNESIUM SULFATE IN D5W 1-5 GM/100ML-% IV SOLN
1.0000 g | Freq: Once | INTRAVENOUS | Status: AC
  Administered 2019-12-05: 11:00:00 1 g via INTRAVENOUS
  Filled 2019-12-05: qty 100

## 2019-12-05 MED ORDER — SPIRONOLACTONE 25 MG PO TABS: 25.0000 mg | ORAL_TABLET | Freq: Every day | ORAL | Status: DC

## 2019-12-05 MED ORDER — SODIUM CHLORIDE 0.9% FLUSH: 10.0000 mL | Freq: Two times a day (BID) | INTRAVENOUS | Status: DC

## 2019-12-05 MED ORDER — INSULIN GLARGINE 100 UNIT/ML ~~LOC~~ SOLN
15.0000 [IU] | Freq: Every day | SUBCUTANEOUS | Status: DC
  Administered 2019-12-06 – 2019-12-10 (×5): 15 [IU] via SUBCUTANEOUS
  Filled 2019-12-05 (×6): qty 0.15

## 2019-12-05 MED ORDER — SODIUM CHLORIDE 0.9% FLUSH: 10.0000 mL | INTRAVENOUS | Status: DC | PRN

## 2019-12-05 MED ORDER — NOREPINEPHRINE 4 MG/250ML-% IV SOLN
2.0000 ug/min | INTRAVENOUS | Status: DC
  Administered 2019-12-05: 18:00:00 10 ug/min via INTRAVENOUS
  Administered 2019-12-05: 7 ug/min via INTRAVENOUS
  Administered 2019-12-05: 5 ug/min via INTRAVENOUS
  Administered 2019-12-06 (×2): 10 ug/min via INTRAVENOUS
  Administered 2019-12-06: 15:00:00 3 ug/min via INTRAVENOUS
  Filled 2019-12-05 (×6): qty 250

## 2019-12-05 MED ORDER — SODIUM CHLORIDE 0.9 % IV SOLN
250.0000 mL | INTRAVENOUS | Status: DC
  Administered 2019-12-05: 250 mL via INTRAVENOUS

## 2019-12-05 MED ORDER — ALBUMIN HUMAN 25 % IV SOLN
12.5000 g | Freq: Once | INTRAVENOUS | Status: AC
  Administered 2019-12-05: 17:00:00 12.5 g via INTRAVENOUS
  Filled 2019-12-05: qty 50

## 2019-12-05 MED ORDER — LACTATED RINGERS IV BOLUS
250.0000 mL | Freq: Once | INTRAVENOUS | Status: AC
  Administered 2019-12-05: 01:00:00 250 mL via INTRAVENOUS

## 2019-12-05 NOTE — Progress Notes (Signed)
Patient has orders for bronchopulmonary hygiene with chest vest. Patient refusing chest vest at this time. Currently all metaneb units are unavailable and in use on other floors. Patient was agreeable to chest PT using vibration therapy mode on the patient's bed. Tolerated well. Strong non-productive cough upon request. Encouraged use on flutter valve.

## 2019-12-05 NOTE — Progress Notes (Signed)
PROGRESS NOTE    Paul Hernandez  IOE:[id number] DOB: 12/23/1965 DOA: 12/03/2019 PCP: [name], FNP    Brief Narrative:  HPI: Paul Hernandez is a 54 y.o. male with medical history significant of sCHF with EF 20-25%, hypertension, diabetes mellitus, COPD, schizophrenia, who presents with shortness of breath. This is direct admission from clinic, NP Anderson Malta.  Patient states that he has worsening shortness breath since yesterday, which has been progressively worsening.  He has mild dry cough. He denies fever, chills, chest pain.  Patient denies nausea, vomiting, diarrhea, abdominal pain, symptoms of UTI or unilateral weakness. Pt was seen by NP, Anderson Malta at the clinic, an was found to have bilateral leg edema and mild wheezing on auscultation, concerning from combination of COPD and CHF exacerbation.  Patient did not take his medications the past 2 days.   At clinic, oxygen saturation 99% on room air, blood pressure 152/102, heart rate 53, RR 24, temperature normal. We are consulted for direct admission from clinic.  Patient is accepted to telemetry bed as inpatient. Pending covid 19 RVP PCR. Pt is started on BiPAP  2/11: Patient was on BiPAP until early this morning.  Weaned off by respiratory therapy.  Tolerating well.  On 3 L nasal cannula this morning.  Diuresing well.  Patient did have an episode of SVT with ventricular rate up to 170.  Sustained for approximately 30 seconds then reverted back to normal sinus rhythm without intervention.  ICU consulted, discussed with ICU attending.  2/12: Patient seen and examined.  Was on BiPAP overnight again.  Remains in stepdown status.  Hypotension noted overnight requiring initiation of vasopressor therapy.  At time of my evaluation this morning the patient is on 7 mcg of Levophed.  No V. tach noted on telemetry over interval.  Patient appears in good spirits this morning.  Again requesting food and drink.   Assessment & Plan:   Principal  Problem:   Acute on chronic systolic CHF (congestive heart failure) (HCC) Active Problems:   COPD exacerbation (HCC)   Schizophrenia (HCC)   Hyperlipidemia, unspecified   Hypertension   Diabetes mellitus without complication ([place])  Hypotension/shock Patient was started on vasopressors overnight Currently on Levophed, 7 mcg as of this note Discussed with ICU attending, central access to be placed Titrate off vasopressors as tolerated Maintain MAP greater than 65 Hold blood pressure medications to allow more room for diuresis   Acute on chronic systolic CHF (congestive heart failure) ([place]):  2D echo on 11/30/2018 showed EF of 20-25%.   Patient has elevated BNP 4350, 3+ leg edema bilaterally chest x-ray with cardiomegaly and vascular testing consistent with CHF exacerbation. -Weaned off BiPAP for now -Remains in stepdown status Plan: Continue stepdown status for now Lasix dose decreased to 40 mg IV twice daily, can escalate or give extra dose if pressures allow and patient not meeting diuresis goals Follow-up 2D echocardiogram- EF 20- 25%, consistent with prior Daily weights Strict I's and O's Low-sodium diet 1500 cc fluid restriction Target net -1-1.5L per day Continue Entresto (currently on hold given hypotension/pressors) Continue Coreg (currently on hold given hypotension/pressors) Cardiology consulted  COPD exacerbation ([place]): -Bronchodilators -As needed Mucinex -Solu-Medrol 40 BID, continue to tirate - Anticipate transition to PO steroids starting tomorrow  Schizophrenia [facility]): -Patient is haldol injection -Benztropine -ativan prn  Hyperlipidemia, unspecified: -crestor and zetia  HTN:  -Continue home medications  Amlodipine, Coreg -hydralazine prn  Diabetes mellitus without complication ([place]) Most recent A1c 8.3 on 11/29/18, poorly controled.  Patient is taking [place], Metformin, Victoza at home Plan: All oral agents on hold Lantus 10 units  daily Sliding scale coverage   DVT prophylaxis: Lovenox Code Status: Full Family Communication: None Disposition Plan: Home, anticipate 24 to 48 hours once respiratory status stabilized patient euvolemic  Consultants:   Intensivist  Procedures:   None  Antimicrobials:   None   Subjective: Patient seen and examined Weaned off BiPAP, on 3 L nasal cannula Difficult to understand Repeatedly requests food and water  Objective: Vitals:   12/05/19 1130 12/05/19 1200 12/05/19 1230 12/05/19 1300  BP: 92/62 (!) 82/52 (!) 73/56 99/60  Pulse: 70 69 72 79  Resp:      Temp:      TempSrc:      SpO2: 97% 95% 93% (!) 87%  Weight:      Height:        Intake/Output Summary (Last 24 hours) at 12/05/2019 1436 Last data filed at 12/05/2019 1300 Gross per 24 hour  Intake 552.55 ml  Output 800 ml  Net -247.45 ml   Filed Weights   12/03/19 1435 12/04/19 0500 12/05/19 0421  Weight: 110.2 kg 108.7 kg 110.7 kg    Examination:  General exam: Appears calm and comfortable  Respiratory system: Decreased air movement, scattered mild wheeze, normal respiratory effort, bibasilar crackles Cardiovascular system: S1-S2, no murmurs, 3+ pitting edema bilateral, positive JVD gastrointestinal system: Abdomen is nondistended, soft and nontender. No organomegaly or masses felt. Normal bowel sounds heard. Central nervous system: Alert and oriented. No focal neurological deficits. Extremities: Symmetric 5 x 5 power. Skin: No rashes, lesions or ulcers Psychiatry: Judgement and insight appear normal. Mood & affect appropriate.     Data Reviewed: I have personally reviewed following labs and imaging studies  CBC: Recent Labs  Lab 12/03/19 1256  WBC 8.4  HGB 15.4  HCT 52.4*  MCV 89.7  PLT 308   Basic Metabolic Panel: Recent Labs  Lab 12/03/19 1256 12/04/19 0403 12/05/19 0449  NA 141 142 137  K 4.7 4.5 4.4  CL 96* 92* 92*  CO2 34* 40* 37*  GLUCOSE 187* 200* 201*  BUN 17 19 29*   CREATININE 0.97 1.05 1.02  CALCIUM 8.9 8.8* 7.8*  MG  --  2.0 1.9  PHOS  --   --  5.2*   GFR: Estimated Creatinine Clearance: 108.8 mL/min (by C-G formula based on SCr of 1.02 mg/dL). Liver Function Tests: No results for input(s): AST, ALT, ALKPHOS, BILITOT, PROT, ALBUMIN in the last 168 hours. No results for input(s): LIPASE, AMYLASE in the last 168 hours. No results for input(s): AMMONIA in the last 168 hours. Coagulation Profile: No results for input(s): INR, PROTIME in the last 168 hours. Cardiac Enzymes: No results for input(s): CKTOTAL, CKMB, CKMBINDEX, TROPONINI in the last 168 hours. BNP (last 3 results) No results for input(s): PROBNP in the last 8760 hours. HbA1C: No results for input(s): HGBA1C in the last 72 hours. CBG: Recent Labs  Lab 12/04/19 1113 12/04/19 1608 12/04/19 2250 12/05/19 0728 12/05/19 1129  GLUCAP 116* 240* 176* 204* 321*   Lipid Profile: No results for input(s): CHOL, HDL, LDLCALC, TRIG, CHOLHDL, LDLDIRECT in the last 72 hours. Thyroid Function Tests: No results for input(s): TSH, T4TOTAL, FREET4, T3FREE, THYROIDAB in the last 72 hours. Anemia Panel: No results for input(s): VITAMINB12, FOLATE, FERRITIN, TIBC, IRON, RETICCTPCT in the last 72 hours. Sepsis Labs: No results for input(s): PROCALCITON, LATICACIDVEN in the last 168 hours.  Recent Results (from the past  240 hour(s))  MRSA PCR Screening     Status: None   Collection Time: 12/03/19 12:24 PM   Specimen: Nasal Mucosa; Nasopharyngeal  Result Value Ref Range Status   MRSA by PCR NEGATIVE NEGATIVE Final    Comment:        The GeneXpert MRSA Assay (FDA approved for NASAL specimens only), is one component of a comprehensive MRSA colonization surveillance program. It is not intended to diagnose MRSA infection nor to guide or monitor treatment for MRSA infections. Performed at [facility], [address] [place], [place] [address]   Respiratory Panel by RT PCR (Flu  A&B, Covid) - Nasopharyngeal Swab     Status: None   Collection Time: 12/03/19 12:24 PM   Specimen: Nasopharyngeal Swab  Result Value Ref Range Status   SARS Coronavirus 2 by RT PCR NEGATIVE NEGATIVE Final    Comment: (NOTE) SARS-CoV-2 target nucleic acids are NOT DETECTED. The SARS-CoV-2 RNA is generally detectable in upper respiratoy specimens during the acute phase of infection. The lowest concentration of SARS-CoV-2 viral copies this assay can detect is 131 copies/mL. A negative result does not preclude SARS-Cov-2 infection and should not be used as the sole basis for treatment or other patient management decisions. A negative result may occur with  improper specimen collection/handling, submission of specimen other than nasopharyngeal swab, presence of viral mutation(s) within the areas targeted by this assay, and inadequate number of viral copies (<131 copies/mL). A negative result must be combined with clinical observations, patient history, and epidemiological information. The expected result is Negative. Fact Sheet for Patients:  https://www.moore.com/ Fact Sheet for Healthcare Providers:  https://www.young.biz/ This test is not yet ap proved or cleared by the [place] FDA and  has been authorized for detection and/or diagnosis of SARS-CoV-2 by FDA under an Emergency Use Authorization (EUA). This EUA will remain  in effect (meaning this test can be used) for the duration of the COVID-19 declaration under Section 564(b)(1) of the Act, 21 U.S.C. section 360bbb-3(b)(1), unless the authorization is terminated or revoked sooner.    Influenza A by PCR NEGATIVE NEGATIVE Final   Influenza B by PCR NEGATIVE NEGATIVE Final    Comment: (NOTE) The Xpert Xpress SARS-CoV-2/FLU/RSV assay is intended as an aid in  the diagnosis of influenza from Nasopharyngeal swab specimens and  should not be used as a sole basis for treatment. Nasal washings  and  aspirates are unacceptable for Xpert Xpress SARS-CoV-2/FLU/RSV  testing. Fact Sheet for Patients: https://www.moore.com/ Fact Sheet for Healthcare Providers: https://www.young.biz/ This test is not yet approved or cleared by the [place] FDA and  has been authorized for detection and/or diagnosis of SARS-CoV-2 by  FDA under an Emergency Use Authorization (EUA). This EUA will remain  in effect (meaning this test can be used) for the duration of the  Covid-19 declaration under Section 564(b)(1) of the Act, 21  U.S.C. section 360bbb-3(b)(1), unless the authorization is  terminated or revoked. Performed at [facility], [address], [place], [place] [address]          Radiology Studies: [facility] Chest [place] 1 View  Result Date: 12/03/2019 CLINICAL DATA:  Shortness of breath EXAM: PORTABLE CHEST 1 VIEW COMPARISON:  07/08/2019 FINDINGS: Cardiomegaly with vascular congestion. Small right pleural effusion. Right basilar atelectasis or infiltrate. Left lung clear. No edema. No acute bony abnormality. IMPRESSION: Cardiomegaly with vascular congestion. Small right effusion with right base atelectasis or infiltrate. Electronically Signed   By: Charlett Nose M.D.  On: 12/03/2019 15:06   ECHOCARDIOGRAM COMPLETE  Result Date: 12/04/2019    ECHOCARDIOGRAM REPORT   Patient Name:   Durward ParcelDENNIS Odom Date of Exam: 12/04/2019 Medical Rec #:  [id number]     Height:       72.0 in Accession #:    [id number]    Weight:       239.6 lb Date of Birth:  1966/03/29    BSA:          2.30 m Patient Age:    53 years      BP:           106/91 mmHg Patient Gender: M             HR:           77 bpm. Exam Location:  [facility] Procedure: 2D Echo, Color Doppler, Cardiac Doppler and Intracardiac            Opacification Agent Indications:     I50.31 CHF-Acute Diastolic  History:         Patient has prior history of Echocardiogram examinations. CHF,                  COPD; Risk  Factors:Hypertension and Diabetes.  Sonographer:     Humphrey RollsJoan Heiss RDCS (AE) Referring Phys:  [id number] Brien FewXILIN NIU Diagnosing Phys: Julien Nordmannimothy Gollan MD IMPRESSIONS  1. Left ventricular ejection fraction, by estimation, is 20 to 25%. Left ventricular ejection fraction by PLAX is 19 %. The left ventricle has severely decreased function. The left ventrical demonstrates global hypokinesis. The left ventricular internal  cavity size was severely dilated. Left ventricular diastolic parameters are consistent with Grade I diastolic dysfunction (impaired relaxation).  2. Right ventricular systolic function is moderately reduced. The right ventricular size is mildly enlarged. There is normal pulmonary artery systolic pressure. FINDINGS  Left Ventricle: Left ventricular ejection fraction, by estimation, is 20 to 25%. Left ventricular ejection fraction by PLAX is 19 % The left ventricle has severely decreased function. The left ventricle demonstrates global hypokinesis. Definity contrast  agent was given IV to delineate the left ventricular endocardial borders. The left ventricular internal cavity size was severely dilated. There is no left ventricular hypertrophy. Left ventricular diastolic parameters are consistent with Grade I diastolic dysfunction (impaired relaxation). Right Ventricle: The right ventricular size is mildly enlarged. No increase in right ventricular wall thickness. Right ventricular systolic function is moderately reduced. There is normal pulmonary artery systolic pressure. The tricuspid regurgitant velocity is 2.06 m/s, and with an assumed right atrial pressure of 10 mmHg, the estimated right ventricular systolic pressure is 27.0 mmHg. Left Atrium: Left atrial size was normal in size. Right Atrium: Right atrial size was normal in size. Pericardium: There is no evidence of pericardial effusion. Mitral Valve: The mitral valve is normal in structure and function. Normal mobility of the mitral valve leaflets. Mild mitral  valve regurgitation. No evidence of mitral valve stenosis. MV peak gradient, 1.9 mmHg. The mean mitral valve gradient is 1.0 mmHg. Tricuspid Valve: The tricuspid valve is normal in structure. Tricuspid valve regurgitation is not demonstrated. No evidence of tricuspid stenosis. Aortic Valve: The aortic valve is normal in structure and function. Aortic valve regurgitation is not visualized. Mild to moderate aortic valve sclerosis/calcification is present, without any evidence of aortic stenosis. Aortic valve mean gradient measures 4.0 mmHg. Aortic valve peak gradient measures 7.7 mmHg. Aortic valve area, by VTI measures 2.01 cm. Pulmonic Valve: The pulmonic valve was normal in structure. Pulmonic valve  regurgitation is not visualized. No evidence of pulmonic stenosis. Aorta: The aortic root is normal in size and structure. Venous: The inferior vena cava is normal in size with greater than 50% respiratory variability, suggesting right atrial pressure of 3 mmHg. The inferior vena cava and the hepatic vein show a normal flow pattern. IAS/Shunts: No atrial level shunt detected by color flow Doppler.  LEFT VENTRICLE PLAX 2D LV EF:         Left            Diastology                ventricular     LV e' lateral:   5.00 cm/s                ejection        LV E/e' lateral: 14.0                fraction by                PLAX is 19                % LVIDd:         6.14 cm LVIDs:         5.60 cm LV PW:         1.11 cm LV IVS:        0.92 cm LVOT diam:     2.20 cm LV SV:         46.00 ml LV SV Index:   15.15 LVOT Area:     3.80 cm  RIGHT VENTRICLE RV Basal diam:  4.20 cm TAPSE (M-mode): 1.6 cm LEFT ATRIUM             Index       RIGHT ATRIUM           Index LA diam:        3.80 cm 1.65 cm/m  RA Area:     24.20 cm LA Vol (A2C):   57.7 ml 25.08 ml/m RA Volume:   78.00 ml  33.90 ml/m LA Vol (A4C):   48.0 ml 20.86 ml/m LA Biplane Vol: 55.7 ml 24.21 ml/m  AORTIC VALVE                   PULMONIC VALVE AV Area (Vmax):    2.73 cm     PV Vmax:       0.86 m/s AV Area (Vmean):   2.13 cm    PV Vmean:      54.200 cm/s AV Area (VTI):     2.01 cm    PV VTI:        0.121 m AV Vmax:           139.00 cm/s PV Peak grad:  3.0 mmHg AV Vmean:          94.700 cm/s PV Mean grad:  1.0 mmHg AV VTI:            0.229 m AV Peak Grad:      7.7 mmHg AV Mean Grad:      4.0 mmHg LVOT Vmax:         99.80 cm/s LVOT Vmean:        53.100 cm/s LVOT VTI:          0.121 m LVOT/AV VTI ratio: 0.53  AORTA Ao Root diam: 3.30 cm MITRAL VALVE  TRICUSPID VALVE MV Area (PHT): 5.81 cm             TR Peak grad:   17.0 mmHg MV Peak grad:  1.9 mmHg             TR Vmax:        206.00 cm/s MV Mean grad:  1.0 mmHg MV Vmax:       0.69 m/s             SHUNTS MV Vmean:      48.5 cm/s            Systemic VTI:  0.12 m MV Decel Time: 131 msec             Systemic Diam: 2.20 cm MV E velocity: 69.85 cm/s 103 cm/s MV A velocity: 73.00 cm/s 70.3 cm/s MV E/A ratio:  0.96       1.5 Julien Nordmann MD Electronically signed by Julien Nordmann MD Signature Date/Time: 12/04/2019/6:12:28 PM    Final    [place] EKG SITE RITE  Result Date: 12/05/2019 If Site Rite image not attached, placement could not be confirmed due to current cardiac rhythm.       Scheduled Meds: . amLODipine  10 mg Oral Daily  . budesonide  1 puff Inhalation TID  . carvedilol  25 mg Oral BID WC  . chlorhexidine  15 mL Mouth Rinse BID  . Chlorhexidine Gluconate Cloth  6 each Topical Daily  . cholecalciferol  5,000 Units Oral Daily  . dextromethorphan-guaiFENesin  1 tablet Oral BID  . enoxaparin (LOVENOX) injection  40 mg Subcutaneous Q24H  . ezetimibe  10 mg Oral Daily  . fluticasone  1 spray Each Nare Daily  . folic acid  1 mg Oral Daily  . furosemide  40 mg Intravenous Q12H  . insulin aspart  0-5 Units Subcutaneous QHS  . insulin aspart  0-9 Units Subcutaneous TID WC  . insulin glargine  10 Units Subcutaneous Daily  . ipratropium-albuterol  3 mL Nebulization Q6H  . isosorbide-hydrALAZINE  1  tablet Oral TID  . ivabradine  5 mg Oral BID WC  . levofloxacin  500 mg Oral Q24H  . mouth rinse  15 mL Mouth Rinse q12n4p  . methylPREDNISolone (SOLU-MEDROL) injection  40 mg Intravenous Q12H  . multivitamin with minerals  1 tablet Oral Daily  . rosuvastatin  40 mg Oral Daily  . sacubitril-valsartan  1 tablet Oral BID  . sodium chloride flush  3 mL Intravenous Q12H  . spironolactone  25 mg Oral Daily  . thiamine  100 mg Oral Daily   Continuous Infusions: . sodium chloride    . sodium chloride 250 mL (12/05/19 0242)  . norepinephrine (LEVOPHED) Adult infusion 7 mcg/min (12/05/19 1050)     LOS: 2 days    Time spent: 35 minutes    Tresa Moore, MD [facility] Pager [phone]  If 7PM-7AM, please contact night-coverage  12/05/2019, 2:36 PM

## 2019-12-05 NOTE — Progress Notes (Signed)
Pharmacy Electrolyte Monitoring Consult:  Pharmacy consulted to assist in monitoring and replacing electrolytes in this 54 y.o. male admitted on 12/03/2019. Patient currently ordered furosemide 40mg  IV BID and peripheral norepinephrine.   Labs:  Sodium (mmol/L)  Date Value  12/05/2019 137   Potassium (mmol/L)  Date Value  12/05/2019 4.4   Magnesium (mg/dL)  Date Value  02/02/2020 1.9   Phosphorus (mg/dL)  Date Value  53/61/4431 5.2 (H)   Calcium (mg/dL)  Date Value  54/00/8676 7.8 (L)   Albumin (g/dL)  Date Value  19/50/9326 3.2 (L)   Corrected Calcium: 8.4   Assessment/Plan: Patient ordered magnesium 1g IV x 1 this am.   Per consult, will replace to maintain potassium > 4 and magnesium > 2.   BMP/Magnesium with am labs.   Pharmacy will continue to monitor an adjust per consult.   Simpson,Michael L 12/05/2019 6:25 PM

## 2019-12-05 NOTE — Progress Notes (Signed)
Peripherally Inserted Central Catheter/Midline Placement  The IV Nurse has discussed with the patient and/or persons authorized to consent for the patient, the purpose of this procedure and the potential benefits and risks involved with this procedure.  The benefits include less needle sticks, lab draws from the catheter, and the patient may be discharged home with the catheter. Risks include, but not limited to, infection, bleeding, blood clot (thrombus formation), and puncture of an artery; nerve damage and irregular heartbeat and possibility to perform a PICC exchange if needed/ordered by physician.  Alternatives to this procedure were also discussed.  B[employer] Power PICC patient education guide, fact sheet on infection prevention and patient information card has been provided to patient /or left at bedside.    PICC/Midline Placement Documentation     Consent obtained by staff RN   Franne Grip Renee 12/05/2019, 4:02 PM

## 2019-12-05 NOTE — Progress Notes (Signed)
CRITICAL CARE PROGRESS NOTE    Name: Paul Hernandez MRN: [id number] DOB: 1965/11/19     LOS: 2 Referring physician: Dr Georgeann Oppenheim     SUBJECTIVE FINDINGS & SIGNIFICANT EVENTS   Patient description:  Patient with advanced systolic CHF with EF of 20 to 23%, schizoaffective disorder, lifelong smoking with COPD admitted due to progressively worsening dyspnea and shortness of breath found to have severe lower extremity edema as well as phlegm production and oxygen desaturation suggestive of concomitant COPD with CHF exacerbation.  Patient was initially brought to the unit with a stepdown service on hospitalist team however was noted to have tachycardic episodes with narrow complex tachyarrhythmia over 150 BMP which prompted critical care consultation   Lines / Drains: PIVx2  Cultures / Sepsis markers: COVID negative Flu A & B negative  Antibiotics: Levoquin 500 po daily for AECOPD   Protocols / Consultants: PCCM and Hospitalist    Events:  12/04/19 - SVT episodes >160HR, self resolved after few minutes 12/05/19- patient is on levophed with borderline low BP   PAST MEDICAL HISTORY   Past Medical History:  Diagnosis Date  . CHF (congestive heart failure) (HCC)   . COPD (chronic obstructive pulmonary disease) (HCC)   . Diabetes mellitus without complication (HCC)   . Hypertension   . Mental health disorder      SURGICAL HISTORY   Past Surgical History:  Procedure Laterality Date  . NO PAST SURGERIES       FAMILY HISTORY   Family History  Problem Relation Age of Onset  . CVA Mother   . CAD Father      SOCIAL HISTORY   Social History   Tobacco Use  . Smoking status: Former Smoker    Packs/day: 1.00    Years: 35.00    Pack years: 35.00    Types: Cigarettes    Start date:  09/07/2019  . Smokeless tobacco: Never Used  Substance Use Topics  . Alcohol use: No  . Drug use: No     MEDICATIONS   Current Medication:  Current Facility-Administered Medications:  .  0.9 %  sodium chloride infusion, 250 mL, Intravenous, PRN, Lorretta Harp, MD .  0.9 %  sodium chloride infusion, 250 mL, Intravenous, Continuous, Blakeney, Neldon Newport, NP, Last Rate: 20 mL/hr at 12/05/19 0242, 250 mL at 12/05/19 0242 .  acetaminophen (TYLENOL) tablet 650 mg, 650 mg, Oral, Q6H PRN, Lorretta Harp, MD .  albuterol (PROVENTIL) (2.5 MG/3ML) 0.083% nebulizer solution 2.5 mg, 2.5 mg, Nebulization, Q4H PRN, Bertram Savin, RPH, 2.5 mg at 12/03/19 1704 .  amLODipine (NORVASC) tablet 10 mg, 10 mg, Oral, Daily, Lorretta Harp, MD .  benztropine (COGENTIN) tablet 2 mg, 2 mg, Oral, QHS PRN, Lorretta Harp, MD, 2 mg at 12/03/19 2230 .  budesonide (PULMICORT) 180 MCG/ACT inhaler 1 puff, 1 puff, Inhalation, TID, Lorretta Harp, MD, 1 puff at 12/05/19 1506 .  carvedilol (COREG) tablet 25 mg, 25 mg, Oral, BID WC, Lorretta Harp, MD, 25 mg at 12/04/19 0747 .  chlorhexidine (PERIDEX) 0.12 % solution 15 mL, 15 mL, Mouth Rinse, BID, Lorretta Harp, MD, 15 mL at 12/05/19 0912 .  Chlorhexidine Gluconate Cloth 2 % PADS 6 each, 6 each, Topical, Daily, Lorretta Harp, MD, 6 each at 12/05/19 [phone] .  cholecalciferol (VITAMIN D3) tablet 5,000 Units, 5,000 Units, Oral, Daily, Lorretta Harp, MD, 5,000 Units at 12/05/19 0912 .  dextromethorphan-guaiFENesin (MUCINEX DM) 30-600 MG per 12 hr tablet 1 tablet, 1 tablet, Oral, BID, Lorretta Harp, MD,  1 tablet at 12/05/19 0916 .  diphenhydrAMINE (BENADRYL) capsule 25 mg, 25 mg, Oral, QHS PRN, Lorretta Harp, MD .  enoxaparin (LOVENOX) injection 40 mg, 40 mg, Subcutaneous, Q24H, Lorretta Harp, MD, 40 mg at 12/04/19 1718 .  ezetimibe (ZETIA) tablet 10 mg, 10 mg, Oral, Daily, Lorretta Harp, MD, 10 mg at 12/05/19 0913 .  fluticasone (FLONASE) 50 MCG/ACT nasal spray 1 spray, 1 spray, Each Nare, Daily, Lorretta Harp, MD, 1 spray at  12/05/19 7741 .  folic acid (FOLVITE) tablet 1 mg, 1 mg, Oral, Daily, Karna Christmas, Johniece Hornbaker, MD, 1 mg at 12/05/19 0914 .  furosemide (LASIX) injection 40 mg, 40 mg, Intravenous, Q12H, Sreenath, Sudheer B, MD .  hydrALAZINE (APRESOLINE) injection 10 mg, 10 mg, Intravenous, Q4H PRN, Manuela Schwartz, NP, 10 mg at 12/03/19 2333 .  insulin aspart (novoLOG) injection 0-5 Units, 0-5 Units, Subcutaneous, QHS, Lorretta Harp, MD, 2 Units at 12/03/19 2144 .  insulin aspart (novoLOG) injection 0-9 Units, 0-9 Units, Subcutaneous, TID WC, Lorretta Harp, MD, 7 Units at 12/05/19 1312 .  insulin glargine (LANTUS) injection 10 Units, 10 Units, Subcutaneous, Daily, Lolita Patella B, MD, 10 Units at 12/05/19 0915 .  ipratropium-albuterol (DUONEB) 0.5-2.5 (3) MG/3ML nebulizer solution 3 mL, 3 mL, Nebulization, Q6H, Bertram Savin, RPH, 3 mL at 12/05/19 1304 .  isosorbide-hydrALAZINE (BIDIL) 20-37.5 MG per tablet 1 tablet, 1 tablet, Oral, TID, Lorretta Harp, MD, 1 tablet at 12/04/19 2204 .  ivabradine (CORLANOR) tablet 5 mg, 5 mg, Oral, BID WC, Lorretta Harp, MD, 5 mg at 12/04/19 1716 .  levofloxacin (LEVAQUIN) tablet 500 mg, 500 mg, Oral, Q24H, Karna Christmas, Shanard Treto, MD, 500 mg at 12/04/19 1716 .  LORazepam (ATIVAN) tablet 0.5 mg, 0.5 mg, Oral, Q4H PRN, Lorretta Harp, MD .  MEDLINE mouth rinse, 15 mL, Mouth Rinse, q12n4p, Lorretta Harp, MD, 15 mL at 12/03/19 1506 .  methylPREDNISolone sodium succinate (SOLU-MEDROL) 40 mg/mL injection 40 mg, 40 mg, Intravenous, Q12H, Karna Christmas, Chidi Shirer, MD, 40 mg at 12/05/19 0920 .  morphine CONCENTRATE 10 MG/0.5ML oral solution 10 mg, 10 mg, Oral, Q4H PRN, Lorretta Harp, MD .  multivitamin with minerals tablet 1 tablet, 1 tablet, Oral, Daily, Vida Rigger, MD, 1 tablet at 12/05/19 0915 .  norepinephrine (LEVOPHED) 4mg  in premix infusion, 2-10 mcg/min, Intravenous, Titrated, Blakeney, Dana G, NP, Last Rate: 26.3 mL/hr at 12/05/19 1050, 7 mcg/min at 12/05/19 1050 .  ondansetron (ZOFRAN) injection 4 mg, 4  mg, Intravenous, Q8H PRN, 02/02/20, MD, 4 mg at 12/05/19 0109 .  rosuvastatin (CRESTOR) tablet 40 mg, 40 mg, Oral, Daily, 02/02/20, MD, 40 mg at 12/04/19 1716 .  sacubitril-valsartan (ENTRESTO) 97-103 mg per tablet, 1 tablet, Oral, BID, 02/01/20, MD, 1 tablet at 12/04/19 2203 .  sodium chloride flush (NS) 0.9 % injection 10-40 mL, 10-40 mL, Intracatheter, Q12H, Kervin Bones, MD .  sodium chloride flush (NS) 0.9 % injection 10-40 mL, 10-40 mL, Intracatheter, PRN, 04-14-1997, Karynn Deblasi, MD .  sodium chloride flush (NS) 0.9 % injection 3 mL, 3 mL, Intravenous, Q12H, Karna Christmas, MD, 3 mL at 12/05/19 0921 .  sodium chloride flush (NS) 0.9 % injection 3 mL, 3 mL, Intravenous, PRN, 02/02/20, MD .  spironolactone (ALDACTONE) tablet 25 mg, 25 mg, Oral, Daily, Lorretta Harp, MD .  thiamine tablet 100 mg, 100 mg, Oral, Daily, Lamar Blinks, Sharon Rubis, MD, 100 mg at 12/05/19 02/02/20    ALLERGIES   Asa [aspirin]    REVIEW OF SYSTEMS     10 point ROS conducted and  is negative except as per subjective findings  PHYSICAL EXAMINATION   Vital Signs: Temp:  [97.6 F (36.4 C)-98 F (36.7 C)] 97.6 F (36.4 C) (02/12 0400) Pulse Rate:  [47-80] 71 (02/12 1600) Resp:  [21] 21 (02/11 2334) BP: (71-102)/(48-82) 92/62 (02/12 1600) SpO2:  [87 %-97 %] 91 % (02/12 1600) FiO2 (%):  [40 %] 40 % (02/12 0000) Weight:  [110.7 kg] 110.7 kg (02/12 0421)  GENERAL: Chronically ill-appearing HEAD: Normocephalic, atraumatic.  EYES: Pupils equal, round, reactive to light.  No scleral icterus.  MOUTH: Moist mucosal membrane. NECK: Supple. No thyromegaly. No nodules. No JVD.  PULMONARY: Decreased breath sounds at the bases bilaterally CARDIOVASCULAR: S1 and S2. Regular rate and rhythm. No murmurs, rubs, or gallops.  GASTROINTESTINAL: Soft, nontender, non-distended. No masses. Positive bowel sounds. No hepatosplenomegaly.  MUSCULOSKELETAL: 2+ lower extremity edema with mild clubbing NEUROLOGIC: Mild distress due to  acute illness SKIN:intact,warm,dry   PERTINENT DATA     Infusions: . sodium chloride    . sodium chloride 250 mL (12/05/19 0242)  . norepinephrine (LEVOPHED) Adult infusion 7 mcg/min (12/05/19 1050)   Scheduled Medications: . amLODipine  10 mg Oral Daily  . budesonide  1 puff Inhalation TID  . carvedilol  25 mg Oral BID WC  . chlorhexidine  15 mL Mouth Rinse BID  . Chlorhexidine Gluconate Cloth  6 each Topical Daily  . cholecalciferol  5,000 Units Oral Daily  . dextromethorphan-guaiFENesin  1 tablet Oral BID  . enoxaparin (LOVENOX) injection  40 mg Subcutaneous Q24H  . ezetimibe  10 mg Oral Daily  . fluticasone  1 spray Each Nare Daily  . folic acid  1 mg Oral Daily  . furosemide  40 mg Intravenous Q12H  . insulin aspart  0-5 Units Subcutaneous QHS  . insulin aspart  0-9 Units Subcutaneous TID WC  . insulin glargine  10 Units Subcutaneous Daily  . ipratropium-albuterol  3 mL Nebulization Q6H  . isosorbide-hydrALAZINE  1 tablet Oral TID  . ivabradine  5 mg Oral BID WC  . levofloxacin  500 mg Oral Q24H  . mouth rinse  15 mL Mouth Rinse q12n4p  . methylPREDNISolone (SOLU-MEDROL) injection  40 mg Intravenous Q12H  . multivitamin with minerals  1 tablet Oral Daily  . rosuvastatin  40 mg Oral Daily  . sacubitril-valsartan  1 tablet Oral BID  . sodium chloride flush  10-40 mL Intracatheter Q12H  . sodium chloride flush  3 mL Intravenous Q12H  . spironolactone  25 mg Oral Daily  . thiamine  100 mg Oral Daily   PRN Medications: sodium chloride, acetaminophen, albuterol, benztropine, diphenhydrAMINE, hydrALAZINE, LORazepam, morphine CONCENTRATE, ondansetron (ZOFRAN) IV, sodium chloride flush, sodium chloride flush Hemodynamic parameters:   Intake/Output: 02/11 0701 - 02/12 0700 In: 312.6 [P.O.:240; I.V.:72.6] Out: 2550 [Urine:2550]  Ventilator  Settings: FiO2 (%):  [40 %] 40 %    LAB RESULTS:  Basic Metabolic Panel: Recent Labs  Lab 12/03/19 1256 12/03/19 1256  12/04/19 0403 12/05/19 0449  NA 141  --  142 137  K 4.7   < > 4.5 4.4  CL 96*  --  92* 92*  CO2 34*  --  40* 37*  GLUCOSE 187*  --  200* 201*  BUN 17  --  19 29*  CREATININE 0.97  --  1.05 1.02  CALCIUM 8.9  --  8.8* 7.8*  MG  --   --  2.0 1.9  PHOS  --   --   --  5.2*   < > =  values in this interval not displayed.   Liver Function Tests: No results for input(s): AST, ALT, ALKPHOS, BILITOT, PROT, ALBUMIN in the last 168 hours. No results for input(s): LIPASE, AMYLASE in the last 168 hours. No results for input(s): AMMONIA in the last 168 hours. CBC: Recent Labs  Lab 12/03/19 1256  WBC 8.4  HGB 15.4  HCT 52.4*  MCV 89.7  PLT 308   Cardiac Enzymes: No results for input(s): CKTOTAL, CKMB, CKMBINDEX, TROPONINI in the last 168 hours. BNP: Invalid input(s): POCBNP CBG: Recent Labs  Lab 12/04/19 1608 12/04/19 2250 12/05/19 0728 12/05/19 1129 12/05/19 1612  GLUCAP 240* 176* 204* 321* 237*     IMAGING RESULTS:  Imaging: ECHOCARDIOGRAM COMPLETE  Result Date: 12/04/2019    ECHOCARDIOGRAM REPORT   Patient Name:   Durward ParcelDENNIS Stefanko Date of Exam: 12/04/2019 Medical Rec #:  [id number]     Height:       72.0 in Accession #:    [id number]    Weight:       239.6 lb Date of Birth:  Feb 21, 1966    BSA:          2.30 m Patient Age:    53 years      BP:           106/91 mmHg Patient Gender: M             HR:           77 bpm. Exam Location:  [facility] Procedure: 2D Echo, Color Doppler, Cardiac Doppler and Intracardiac            Opacification Agent Indications:     I50.31 CHF-Acute Diastolic  History:         Patient has prior history of Echocardiogram examinations. CHF,                  COPD; Risk Factors:Hypertension and Diabetes.  Sonographer:     Humphrey RollsJoan Heiss RDCS (AE) Referring Phys:  [id number] Brien FewXILIN NIU Diagnosing Phys: Julien Nordmannimothy Gollan MD IMPRESSIONS  1. Left ventricular ejection fraction, by estimation, is 20 to 25%. Left ventricular ejection fraction by PLAX is 19 %. The left ventricle has severely  decreased function. The left ventrical demonstrates global hypokinesis. The left ventricular internal  cavity size was severely dilated. Left ventricular diastolic parameters are consistent with Grade I diastolic dysfunction (impaired relaxation).  2. Right ventricular systolic function is moderately reduced. The right ventricular size is mildly enlarged. There is normal pulmonary artery systolic pressure. FINDINGS  Left Ventricle: Left ventricular ejection fraction, by estimation, is 20 to 25%. Left ventricular ejection fraction by PLAX is 19 % The left ventricle has severely decreased function. The left ventricle demonstrates global hypokinesis. Definity contrast  agent was given IV to delineate the left ventricular endocardial borders. The left ventricular internal cavity size was severely dilated. There is no left ventricular hypertrophy. Left ventricular diastolic parameters are consistent with Grade I diastolic dysfunction (impaired relaxation). Right Ventricle: The right ventricular size is mildly enlarged. No increase in right ventricular wall thickness. Right ventricular systolic function is moderately reduced. There is normal pulmonary artery systolic pressure. The tricuspid regurgitant velocity is 2.06 m/s, and with an assumed right atrial pressure of 10 mmHg, the estimated right ventricular systolic pressure is 27.0 mmHg. Left Atrium: Left atrial size was normal in size. Right Atrium: Right atrial size was normal in size. Pericardium: There is no evidence of pericardial effusion. Mitral Valve: The mitral valve is normal in structure and function.  Normal mobility of the mitral valve leaflets. Mild mitral valve regurgitation. No evidence of mitral valve stenosis. MV peak gradient, 1.9 mmHg. The mean mitral valve gradient is 1.0 mmHg. Tricuspid Valve: The tricuspid valve is normal in structure. Tricuspid valve regurgitation is not demonstrated. No evidence of tricuspid stenosis. Aortic Valve: The aortic  valve is normal in structure and function. Aortic valve regurgitation is not visualized. Mild to moderate aortic valve sclerosis/calcification is present, without any evidence of aortic stenosis. Aortic valve mean gradient measures 4.0 mmHg. Aortic valve peak gradient measures 7.7 mmHg. Aortic valve area, by VTI measures 2.01 cm. Pulmonic Valve: The pulmonic valve was normal in structure. Pulmonic valve regurgitation is not visualized. No evidence of pulmonic stenosis. Aorta: The aortic root is normal in size and structure. Venous: The inferior vena cava is normal in size with greater than 50% respiratory variability, suggesting right atrial pressure of 3 mmHg. The inferior vena cava and the hepatic vein show a normal flow pattern. IAS/Shunts: No atrial level shunt detected by color flow Doppler.  LEFT VENTRICLE PLAX 2D LV EF:         Left            Diastology                ventricular     LV e' lateral:   5.00 cm/s                ejection        LV E/e' lateral: 14.0                fraction by                PLAX is 19                % LVIDd:         6.14 cm LVIDs:         5.60 cm LV PW:         1.11 cm LV IVS:        0.92 cm LVOT diam:     2.20 cm LV SV:         46.00 ml LV SV Index:   15.15 LVOT Area:     3.80 cm  RIGHT VENTRICLE RV Basal diam:  4.20 cm TAPSE (M-mode): 1.6 cm LEFT ATRIUM             Index       RIGHT ATRIUM           Index LA diam:        3.80 cm 1.65 cm/m  RA Area:     24.20 cm LA Vol (A2C):   57.7 ml 25.08 ml/m RA Volume:   78.00 ml  33.90 ml/m LA Vol (A4C):   48.0 ml 20.86 ml/m LA Biplane Vol: 55.7 ml 24.21 ml/m  AORTIC VALVE                   PULMONIC VALVE AV Area (Vmax):    2.73 cm    PV Vmax:       0.86 m/s AV Area (Vmean):   2.13 cm    PV Vmean:      54.200 cm/s AV Area (VTI):     2.01 cm    PV VTI:        0.121 m AV Vmax:           139.00 cm/s PV Peak grad:  3.0 mmHg  AV Vmean:          94.700 cm/s PV Mean grad:  1.0 mmHg AV VTI:            0.229 m AV Peak Grad:      7.7 mmHg  AV Mean Grad:      4.0 mmHg LVOT Vmax:         99.80 cm/s LVOT Vmean:        53.100 cm/s LVOT VTI:          0.121 m LVOT/AV VTI ratio: 0.53  AORTA Ao Root diam: 3.30 cm MITRAL VALVE                        TRICUSPID VALVE MV Area (PHT): 5.81 cm             TR Peak grad:   17.0 mmHg MV Peak grad:  1.9 mmHg             TR Vmax:        206.00 cm/s MV Mean grad:  1.0 mmHg MV Vmax:       0.69 m/s             SHUNTS MV Vmean:      48.5 cm/s            Systemic VTI:  0.12 m MV Decel Time: 131 msec             Systemic Diam: 2.20 cm MV E velocity: 69.85 cm/s 103 cm/s MV A velocity: 73.00 cm/s 70.3 cm/s MV E/A ratio:  0.96       1.5 Ida Rogue MD Electronically signed by Ida Rogue MD Signature Date/Time: 12/04/2019/6:12:28 PM    Final    [place] EKG SITE RITE  Result Date: 12/05/2019 If Site Rite image not attached, placement could not be confirmed due to current cardiac rhythm.     ASSESSMENT AND PLAN    -Multidisciplinary rounds held today  Acute Hypoxic Respiratory Failure -due to acute decompensated systolic CHF with EF 20 to 25% and concurrent moderate to severe acute COPD exacerbation -continue BiPAP support as needed -continue Bronchodilator Therapy -Continue Levaquin 500 p.o. daily -Aggressive bronchopulmonary hygiene-we will initiate vest therapy with incentive spirometer and Acapella device   Acute decompensated systolic CHF with EF of 20 to 25% -Clinically improved status post diuresis with net 3600 mL diuresis thus far -Agree with current Lasix 60 IV twice daily -Strict I's and O's-please document urine output -Fluid restriction of 1200 cc per 24 hours -PT OT -BiPAP nightly ICU telemetry monitoring  -because we are diuresing patient and he is on levophed I will hold beta blockade and antihypertensives for now   Narrow complex tachyarrhythmia  -Patient had multiple episodes of SVT which have self resolved  -Continue ICU telemetry monitoring  -Immediate electrolyte repletion  while on aggressive diuresis -Cardiac biomarkers -Repeat echo completed today-final report pending   Extensive psychiatric history -Schizophrenia -Continue home regimen -Consider psychiatry evaluation if necessary   GI/Nutrition GI PROPHYLAXIS as indicated DIET-->TF's as tolerated Constipation protocol as indicated  ENDO - ICU hypoglycemic\Hyperglycemia protocol -check FSBS per protocol   ELECTROLYTES -follow labs as needed -replace as needed -pharmacy consultation   DVT/GI PRX ordered -SCDs  TRANSFUSIONS AS NEEDED MONITOR FSBS ASSESS the need for LABS as needed   Critical care provider statement:    Critical care time (minutes):  33   Critical care time was exclusive of:  Separately billable procedures and  treating other patients   Critical care was necessary to treat or prevent imminent or life-threatening deterioration of the following conditions:   Acute hypoxemic respiratory failure, acute decompensated systolic CHF exacerbation, acute moderate to severe COPD exacerbation, multiple comorbid conditions   Critical care was time spent personally by me on the following activities:  Development of treatment plan with patient or surrogate, discussions with consultants, evaluation of patient's response to treatment, examination of patient, obtaining history from patient or surrogate, ordering and performing treatments and interventions, ordering and review of laboratory studies and re-evaluation of patient's condition.  I assumed direction of critical care for this patient from another provider in my specialty: no    This document was prepared using Dragon voice recognition software and may include unintentional dictation errors.    Vida Rigger, M.D.  Division of Pulmonary & Critical Care Medicine  [facility]

## 2019-12-05 NOTE — Consult Note (Signed)
[facility] Consultation Note  Patient ID: Paul Hernandez, MRN: [id number], DOB/AGE: [birth year] 54 y.o. Admit date: 12/03/2019   Date of Consult: 12/05/2019 Primary Physician: [name], FNP Primary Cardiologist: [name]  Chief Complaint: No chief complaint on file.  Reason for Consult: Heart failure  HPI: 54 y.o. male with known diabetes hypertension hyperlipidemia and chronic systolic dysfunction congestive heart failure with ejection fraction of 20%.  The patient has been on appropriate medication management and increasing and there is medication management for quite some time.  This includes Entresto beta-blocker and diuretics.  The patient has had significant progression of issues over the last several days for which she has had lower extremity edema pulmonary edema hypoxia and shortness of breath.  Chest x-ray has shown pleural effusion and pulmonary edema BNP is 435.  The patient has had intravenous Lasix which is slightly improving his symptoms and he is breathing much better.  There is no evidence of myocardial infarction or anginal symptoms at this time  Past Medical History:  Diagnosis Date  . CHF (congestive heart failure) ([place])   . COPD (chronic obstructive pulmonary disease) ([place])   . Diabetes mellitus without complication ([place])   . Hypertension   . Mental health disorder       Surgical History:  Past Surgical History:  Procedure Laterality Date  . NO PAST SURGERIES       Home Meds: Prior to Admission medications   Medication Sig Start Date End Date Taking? Authorizing Provider  albuterol (VENTOLIN HFA) 108 (90 Base) MCG/ACT inhaler Inhale 2 puffs into the lungs every 6 (six) hours as needed for wheezing or shortness of breath. 07/08/19  Yes Blake Divine, MD  amLODipine (NORVASC) 5 MG tablet Take 10 mg by mouth daily.   Yes [provider]  benztropine (COGENTIN) 2 MG tablet Take 2 mg by mouth at bedtime as needed.    Yes [provider]  carvedilol (COREG) 25 MG tablet Take 1 tablet (25 mg total) by mouth 2 (two) times daily with a meal. 11/16/17  Yes [name], MD  Cholecalciferol (VITAMIN D3) 5000 units CAPS Take 5,000 Units by mouth daily.   Yes [provider]  dapagliflozin propanediol (FARXIGA) 5 MG TABS tablet Take 5 mg by mouth daily.   Yes [provider]  dextrose (GLUTOSE) 40 % GEL Take by mouth once as needed for low blood sugar. For blood sugar <70   Yes [provider]  diphenhydrAMINE (BENADRYL) 25 mg capsule Take 25 mg by mouth at bedtime as needed for sleep.    Yes [provider]  ezetimibe (ZETIA) 10 MG tablet Take 10 mg by mouth daily.   Yes [provider]  fluticasone (FLONASE) 50 MCG/ACT nasal spray Place 1 spray into both nostrils daily.   Yes [provider]  formoterol (PERFOROMIST) 20 MCG/2ML nebulizer solution Take 20 mcg by nebulization 2 (two) times daily.   Yes [provider]  Glycopyrrolate (LONHALA MAGNAIR STARTER KIT) 25 MCG/ML SOLN Inhale 25 mcg into the lungs 2 (two) times daily.   Yes [provider]  guaifenesin (ROBITUSSIN) 100 MG/5ML syrup Take 200 mg by mouth every 4 (four) hours as needed for cough.   Yes [provider]  haloperidol decanoate (HALDOL DECANOATE) 100 MG/ML injection Inject 90 mg into the muscle every 28 (twenty-eight) days.    Yes [provider]  ipratropium-albuterol (DUONEB) 0.5-2.5 (3) MG/3ML SOLN Take 3 mLs by nebulization 4 (four) times daily.  Yes [provider]  isosorbide-hydrALAZINE (BIDIL) 20-37.5 MG tablet Take 1 tablet by mouth 3 (three) times daily.   Yes [provider]  ivabradine (CORLANOR) 5 MG TABS tablet Take 1 tablet (5 mg total) by mouth 2 (two) times daily with a meal. 08/11/19  Yes [name], [name], FNP  liraglutide (VICTOZA) 18 MG/3ML SOPN Inject 1.2 mg into the skin daily.   Yes [provider]  LORazepam (ATIVAN) 0.5  MG tablet Take 0.5 mg by mouth every 4 (four) hours as needed (agitation).   Yes [provider]  metFORMIN (GLUCOPHAGE) 500 MG tablet Take 1,000 mg by mouth 2 (two) times daily with a meal.   Yes [provider]  Morphine Sulfate (MORPHINE CONCENTRATE) 10 mg / 0.5 ml concentrated solution Take 10 mg by mouth every 4 (four) hours as needed for severe pain or shortness of breath.   Yes [provider]  predniSONE (DELTASONE) 20 MG tablet Take 20 mg by mouth daily.   Yes [provider]  rosuvastatin (CRESTOR) 40 MG tablet Take 40 mg by mouth daily.   Yes [provider]  sacubitril-valsartan (ENTRESTO) 97-103 MG Take 1 tablet by mouth 2 (two) times daily. 08/04/19  Yes [name], [name], FNP  spironolactone (ALDACTONE) 25 MG tablet Take 0.5 tablets (12.5 mg total) by mouth daily. Patient taking differently: Take 25 mg by mouth daily.  08/28/19 12/03/19 Yes [name], [name], FNP  torsemide (DEMADEX) 20 MG tablet Take 2 tablets (40 mg total) by mouth daily. 08/06/19 12/03/19 Yes [name], FNP    Inpatient Medications:  . amLODipine  10 mg Oral Daily  . budesonide  1 puff Inhalation TID  . carvedilol  25 mg Oral BID WC  . chlorhexidine  15 mL Mouth Rinse BID  . Chlorhexidine Gluconate Cloth  6 each Topical Daily  . cholecalciferol  5,000 Units Oral Daily  . dextromethorphan-guaiFENesin  1 tablet Oral BID  . enoxaparin (LOVENOX) injection  40 mg Subcutaneous Q24H  . ezetimibe  10 mg Oral Daily  . fluticasone  1 spray Each Nare Daily  . folic acid  1 mg Oral Daily  . furosemide  40 mg Intravenous Q12H  . insulin aspart  0-5 Units Subcutaneous QHS  . insulin aspart  0-9 Units Subcutaneous TID WC  . insulin glargine  10 Units Subcutaneous Daily  . ipratropium-albuterol  3 mL Nebulization Q6H  . isosorbide-hydrALAZINE  1 tablet Oral TID  . ivabradine  5 mg Oral BID WC  . levofloxacin  500 mg Oral Q24H  . mouth rinse  15 mL Mouth Rinse q12n4p  .  methylPREDNISolone (SOLU-MEDROL) injection  40 mg Intravenous Q12H  . multivitamin with minerals  1 tablet Oral Daily  . rosuvastatin  40 mg Oral Daily  . sacubitril-valsartan  1 tablet Oral BID  . sodium chloride flush  3 mL Intravenous Q12H  . spironolactone  25 mg Oral Daily  . thiamine  100 mg Oral Daily   . sodium chloride    . sodium chloride 250 mL (12/05/19 0242)  . magnesium sulfate bolus IVPB    . norepinephrine (LEVOPHED) Adult infusion 5 mcg/min (12/05/19 0245)    Allergies:  Allergies  Allergen Reactions  . Diona Fanti [Aspirin]     Social History   Socioeconomic History  . Marital status: Single    Spouse name: Not on file  . Number of children: Not on file  . Years of education: Not on file  . Highest  education level: Not on file  Occupational History  . Occupation: disabled  Tobacco Use  . Smoking status: Former Smoker    Packs/day: 1.00    Years: 35.00    Pack years: 35.00    Types: Cigarettes    Start date: 09/07/2019  . Smokeless tobacco: Never Used  Substance and Sexual Activity  . Alcohol use: No  . Drug use: No  . Sexual activity: Not Currently  Other Topics Concern  . Not on file  Social History Narrative  . Not on file   Social Determinants of Health   Financial Resource Strain: Medium Risk  . Difficulty of Paying Living Expenses: Somewhat hard  Food Insecurity: No Food Insecurity  . Worried About Charity fundraiser in the Last Year: Never true  . Ran Out of Food in the Last Year: Never true  Transportation Needs: No Transportation Needs  . Lack of Transportation (Medical): No  . Lack of Transportation (Non-Medical): No  Physical Activity: Inactive  . Days of Exercise per Week: 0 days  . Minutes of Exercise per Session: 0 min  Stress: No Stress Concern Present  . Feeling of Stress : Not at all  Social Connections: Unknown  . Frequency of Communication with Friends and Family: Once a week  . Frequency of Social Gatherings with Friends  and Family: Once a week  . Attends Religious Services: 1 to 4 times per year  . Active Member of Clubs or Organizations: No  . Attends Archivist Meetings: Never  . Marital Status: Patient refused  Intimate Partner Violence: Not At Risk  . Fear of Current or Ex-Partner: No  . Emotionally Abused: No  . Physically Abused: No  . Sexually Abused: No     Family History  Problem Relation Age of Onset  . CVA Mother   . CAD Father      Review of Systems Positive for shortness of breath PND orthopnea Negative for: General:  chills, fever, night sweats or weight changes.  Cardiovascular: Positive for PND orthopnea negative for syncope dizziness  Dermatological skin lesions rashes Respiratory: Cough congestion Urologic: Frequent urination urination at night and hematuria Abdominal: negative for nausea, vomiting, diarrhea, bright red blood per rectum, melena, or hematemesis Neurologic: negative for visual changes, and/or hearing changes  All other systems reviewed and are otherwise negative except as noted above.  Labs: No results for input(s): CKTOTAL, CKMB, TROPONINI in the last 72 hours. Lab Results  Component Value Date   WBC 8.4 12/03/2019   HGB 15.4 12/03/2019   HCT 52.4 (H) 12/03/2019   MCV 89.7 12/03/2019   PLT 308 12/03/2019    Recent Labs  Lab 12/05/19 0449  NA 137  K 4.4  CL 92*  CO2 37*  BUN 29*  CREATININE 1.02  CALCIUM 7.8*  GLUCOSE 201*   Lab Results  Component Value Date   CHOL 210 (H) 11/29/2018   HDL 47 11/29/2018   LDLCALC 149 (H) 11/29/2018   TRIG 37 11/29/2018   No results found for: DDIMER  Radiology/Studies:  DG Chest Port 1 View  Result Date: 12/03/2019 CLINICAL DATA:  Shortness of breath EXAM: PORTABLE CHEST 1 VIEW COMPARISON:  07/08/2019 FINDINGS: Cardiomegaly with vascular congestion. Small right pleural effusion. Right basilar atelectasis or infiltrate. Left lung clear. No edema. No acute bony abnormality. IMPRESSION:  Cardiomegaly with vascular congestion. Small right effusion with right base atelectasis or infiltrate. Electronically Signed   By: Rolm Baptise M.D.   On: 12/03/2019 15:06  ECHOCARDIOGRAM COMPLETE  Result Date: 12/04/2019    ECHOCARDIOGRAM REPORT   Patient Name:   Paul Hernandez Date of Exam: 12/04/2019 Medical Rec #:  [id number]     Height:       72.0 in Accession #:    [id number]    Weight:       239.6 lb Date of Birth:  [birth year]    BSA:          2.30 m Patient Age:    13 years      BP:           106/91 mmHg Patient Gender: M             HR:           77 bpm. Exam Location:  [facility] Procedure: 2D Echo, Color Doppler, Cardiac Doppler and Intracardiac            Opacification Agent Indications:     I50.31 CHF-Acute Diastolic  History:         Patient has prior history of Echocardiogram examinations. CHF,                  COPD; Risk Factors:Hypertension and Diabetes.  Sonographer:     Charmayne Sheer RDCS (AE) Referring Phys:  [id number] Soledad Gerlach NIU Diagnosing Phys: Ida Rogue MD IMPRESSIONS  1. Left ventricular ejection fraction, by estimation, is 20 to 25%. Left ventricular ejection fraction by PLAX is 19 %. The left ventricle has severely decreased function. The left ventrical demonstrates global hypokinesis. The left ventricular internal  cavity size was severely dilated. Left ventricular diastolic parameters are consistent with Grade I diastolic dysfunction (impaired relaxation).  2. Right ventricular systolic function is moderately reduced. The right ventricular size is mildly enlarged. There is normal pulmonary artery systolic pressure. FINDINGS  Left Ventricle: Left ventricular ejection fraction, by estimation, is 20 to 25%. Left ventricular ejection fraction by PLAX is 19 % The left ventricle has severely decreased function. The left ventricle demonstrates global hypokinesis. Definity contrast  agent was given IV to delineate the left ventricular endocardial borders. The left ventricular internal cavity size was  severely dilated. There is no left ventricular hypertrophy. Left ventricular diastolic parameters are consistent with Grade I diastolic dysfunction (impaired relaxation). Right Ventricle: The right ventricular size is mildly enlarged. No increase in right ventricular wall thickness. Right ventricular systolic function is moderately reduced. There is normal pulmonary artery systolic pressure. The tricuspid regurgitant velocity is 2.06 m/s, and with an assumed right atrial pressure of 10 mmHg, the estimated right ventricular systolic pressure is 03.5 mmHg. Left Atrium: Left atrial size was normal in size. Right Atrium: Right atrial size was normal in size. Pericardium: There is no evidence of pericardial effusion. Mitral Valve: The mitral valve is normal in structure and function. Normal mobility of the mitral valve leaflets. Mild mitral valve regurgitation. No evidence of mitral valve stenosis. MV peak gradient, 1.9 mmHg. The mean mitral valve gradient is 1.0 mmHg. Tricuspid Valve: The tricuspid valve is normal in structure. Tricuspid valve regurgitation is not demonstrated. No evidence of tricuspid stenosis. Aortic Valve: The aortic valve is normal in structure and function. Aortic valve regurgitation is not visualized. Mild to moderate aortic valve sclerosis/calcification is present, without any evidence of aortic stenosis. Aortic valve mean gradient measures 4.0 mmHg. Aortic valve peak gradient measures 7.7 mmHg. Aortic valve area, by VTI measures 2.01 cm. Pulmonic Valve: The pulmonic valve was normal in structure. Pulmonic valve regurgitation is not visualized. No evidence  of pulmonic stenosis. Aorta: The aortic root is normal in size and structure. Venous: The inferior vena cava is normal in size with greater than 50% respiratory variability, suggesting right atrial pressure of 3 mmHg. The inferior vena cava and the hepatic vein show a normal flow pattern. IAS/Shunts: No atrial level shunt detected by color  flow Doppler.  LEFT VENTRICLE PLAX 2D LV EF:         Left            Diastology                ventricular     LV e' lateral:   5.00 cm/s                ejection        LV E/e' lateral: 14.0                fraction by                PLAX is 19                % LVIDd:         6.14 cm LVIDs:         5.60 cm LV PW:         1.11 cm LV IVS:        0.92 cm LVOT diam:     2.20 cm LV SV:         46.00 ml LV SV Index:   15.15 LVOT Area:     3.80 cm  RIGHT VENTRICLE RV Basal diam:  4.20 cm TAPSE (M-mode): 1.6 cm LEFT ATRIUM             Index       RIGHT ATRIUM           Index LA diam:        3.80 cm 1.65 cm/m  RA Area:     24.20 cm LA Vol (A2C):   57.7 ml 25.08 ml/m RA Volume:   78.00 ml  33.90 ml/m LA Vol (A4C):   48.0 ml 20.86 ml/m LA Biplane Vol: 55.7 ml 24.21 ml/m  AORTIC VALVE                   PULMONIC VALVE AV Area (Vmax):    2.73 cm    PV Vmax:       0.86 m/s AV Area (Vmean):   2.13 cm    PV Vmean:      54.200 cm/s AV Area (VTI):     2.01 cm    PV VTI:        0.121 m AV Vmax:           139.00 cm/s PV Peak grad:  3.0 mmHg AV Vmean:          94.700 cm/s PV Mean grad:  1.0 mmHg AV VTI:            0.229 m AV Peak Grad:      7.7 mmHg AV Mean Grad:      4.0 mmHg LVOT Vmax:         99.80 cm/s LVOT Vmean:        53.100 cm/s LVOT VTI:          0.121 m LVOT/AV VTI ratio: 0.53  AORTA Ao Root diam: 3.30 cm MITRAL VALVE  TRICUSPID VALVE MV Area (PHT): 5.81 cm             TR Peak grad:   17.0 mmHg MV Peak grad:  1.9 mmHg             TR Vmax:        206.00 cm/s MV Mean grad:  1.0 mmHg MV Vmax:       0.69 m/s             SHUNTS MV Vmean:      48.5 cm/s            Systemic VTI:  0.12 m MV Decel Time: 131 msec             Systemic Diam: 2.20 cm MV E velocity: 69.85 cm/s 103 cm/s MV A velocity: 73.00 cm/s 70.3 cm/s MV E/A ratio:  0.96       1.5 Ida Rogue MD Electronically signed by Ida Rogue MD Signature Date/Time: 12/04/2019/6:12:28 PM    Final     EKG: Normal sinus rhythm with nonspecific  ST changes  Weights: Filed Weights   12/03/19 1435 12/04/19 0500 12/05/19 0421  Weight: 110.2 kg 108.7 kg 110.7 kg     Physical Exam: Blood pressure 99/81, pulse 74, temperature 97.6 F (36.4 C), temperature source Oral, resp. rate (!) 21, height 6' (1.829 m), weight 110.7 kg, SpO2 91 %. Body mass index is 33.1 kg/m. General: Well developed, well nourished, in no acute distress. Head eyes ears nose throat: Normocephalic, atraumatic, sclera non-icteric, no xanthomas, nares are without discharge. No apparent thyromegaly and/or mass  Lungs: Normal respiratory effort.  no wheezes, basilar rales, no rhonchi.  Heart: RRR with normal S1 S2. no murmur gallop, no rub, PMI is normal size and placement, carotid upstroke normal without bruit, jugular venous pressure is normal Abdomen: Soft, non-tender, non-distended with normoactive bowel sounds. No hepatomegaly. No rebound/guarding. No obvious abdominal masses. Abdominal aorta is normal size without bruit Extremities: 2+ edema. no cyanosis, no clubbing, no ulcers  Peripheral : 2+ bilateral upper extremity pulses, 2+ bilateral femoral pulses, 2+ bilateral dorsal pedal pulse Neuro: Alert and oriented. No facial asymmetry. No focal deficit. Moves all extremities spontaneously. Musculoskeletal: Normal muscle tone without kyphosis Psych:  Responds to questions appropriately with a normal affect.    Assessment: 54 year old male with acute on chronic systolic dysfunction congestive heart failure without evidence of myocardial infarction and having diabetes  Plan: 1.  Continue Entresto and beta-blocker for cardiomyopathy LV systolic dysfunction 2.  Hydralazine isosorbide as necessary as well as amlodipine for hypertension control 3.  Continue intravenous Lasix for further risk reduction in treatment with congestive heart failure and pulmonary edema 4.  No further cardiac diagnostics necessary at this time due to recent evaluation by echocardiogram 5.   Addition of spironolactone for cardiomyopathy LV systolic dysfunction 6.  Further consideration of Farxiga in the future for treatment of congestive heart failure  Signed, Corey Skains M.D. [facility] 12/05/2019, 8:39 AM

## 2019-12-05 NOTE — Progress Notes (Signed)
Pt hypotensive = 77/60 (66) &  asymptomatic ; Annabelle Harman, NP notified. LR 250 cc x 1 bolus ordered & given . Will continue to monitor pt closely.

## 2019-12-05 NOTE — Progress Notes (Signed)
Unable to contact pt's Paul Hernandez. Called and talked to Pt's cousin, Bonita Quin (Bennetta Laos daughter). Bonita Quin gave me [phone] as a contact for [employer]. Bonita Quin states that her dad is getting older and having problems of his own. She states that her and her brother will be making decisions for patient. They will be getting a POA, but will let nursing know when this happens.

## 2019-12-05 NOTE — Progress Notes (Signed)
Inpatient Diabetes Program Recommendations  AACE/ADA: New Consensus Statement on Inpatient Glycemic Control   Target Ranges:  Prepandial:   less than 140 mg/dL      Peak postprandial:   less than 180 mg/dL (1-2 hours)      Critically ill patients:  140 - 180 mg/dL   Results for RUDELL, ORTMAN (MRN [id number]) as of 12/05/2019 13:38  Ref. Range 12/04/2019 07:12 12/04/2019 11:13 12/04/2019 16:08 12/04/2019 22:50 12/05/2019 07:28 12/05/2019 11:29  Glucose-Capillary Latest Ref Range: 70 - 99 mg/dL 119 (H) 417 (H) 408 (H) 176 (H) 204 (H) 321 (H)   Review of Glycemic Control  Diabetes history: DM2 Outpatient Diabetes medications: Victoza 1.2 mg daily, Metformin 1000 mg BID Current orders for Inpatient glycemic control: Lantus 10 units QHS, Novolog 0-9 units TID with meals, Novolog 0-5 units QHS; Solumedrol 40 mg Q12H  Inpatient Diabetes Program Recommendations:    Insulin-Basal: If steroids are continued, please consider increasing Lantus to 15 units QHS.  Thanks, Orlando Penner, RN, MSN, CDE Diabetes Coordinator Inpatient Diabetes Program [phone] (Team Pager from 8am to 5pm)

## 2019-12-06 ENCOUNTER — Inpatient Hospital Stay

## 2019-12-06 ENCOUNTER — Encounter: Payer: Self-pay | Admitting: Internal Medicine

## 2019-12-06 LAB — CBC
HCT: 47.3 % (ref 39.0–52.0)
Hemoglobin: 13.8 g/dL (ref 13.0–17.0)
MCH: 26.2 pg (ref 26.0–34.0)
MCHC: 29.2 g/dL — ABNORMAL LOW (ref 30.0–36.0)
MCV: 89.9 fL (ref 80.0–100.0)
Platelets: 263 10*3/uL (ref 150–400)
RBC: 5.26 MIL/uL (ref 4.22–5.81)
RDW: 15.7 % — ABNORMAL HIGH (ref 11.5–15.5)
WBC: 15.6 10*3/uL — ABNORMAL HIGH (ref 4.0–10.5)
nRBC: 0 % (ref 0.0–0.2)

## 2019-12-06 LAB — BASIC METABOLIC PANEL
Anion gap: 5 (ref 5–15)
BUN: 38 mg/dL — ABNORMAL HIGH (ref 6–20)
CO2: 38 mmol/L — ABNORMAL HIGH (ref 22–32)
Calcium: 7.1 mg/dL — ABNORMAL LOW (ref 8.9–10.3)
Chloride: 96 mmol/L — ABNORMAL LOW (ref 98–111)
Creatinine, Ser: 1.25 mg/dL — ABNORMAL HIGH (ref 0.61–1.24)
GFR calc Af Amer: >60 mL/min (ref >60)
GFR calc non Af Amer: >60 mL/min (ref >60)
Glucose, Bld: 233 mg/dL — ABNORMAL HIGH (ref 70–99)
Potassium: 4.4 mmol/L (ref 3.5–5.1)
Sodium: 139 mmol/L (ref 135–145)

## 2019-12-06 LAB — TROPONIN I (HIGH SENSITIVITY): Troponin I (High Sensitivity): 40 ng/L — ABNORMAL HIGH (ref <18)

## 2019-12-06 LAB — GLUCOSE, CAPILLARY
Glucose-Capillary: 297 mg/dL — ABNORMAL HIGH (ref 70–99)
Glucose-Capillary: 316 mg/dL — ABNORMAL HIGH (ref 70–99)
Glucose-Capillary: 244 mg/dL — ABNORMAL HIGH (ref 70–99)
Glucose-Capillary: 301 mg/dL — ABNORMAL HIGH (ref 70–99)

## 2019-12-06 LAB — MAGNESIUM: Magnesium: 1.8 mg/dL (ref 1.7–2.4)

## 2019-12-06 LAB — BRAIN NATRIURETIC PEPTIDE: B Natriuretic Peptide: 989 pg/mL — ABNORMAL HIGH (ref 0.0–100.0)

## 2019-12-06 MED ORDER — HYDROCORTISONE NA SUCCINATE PF 100 MG IJ SOLR
100.0000 mg | Freq: Two times a day (BID) | INTRAMUSCULAR | Status: DC
  Administered 2019-12-06 – 2019-12-07 (×4): 100 mg via INTRAVENOUS
  Filled 2019-12-06 (×6): qty 2

## 2019-12-06 MED ORDER — SODIUM CHLORIDE 0.9 % IV SOLN
INTRAVENOUS | Status: DC | PRN
  Administered 2019-12-06: 250 mL via INTRAVENOUS

## 2019-12-06 MED ORDER — MAGNESIUM SULFATE IN D5W 1-5 GM/100ML-% IV SOLN
1.0000 g | Freq: Once | INTRAVENOUS | Status: AC
  Administered 2019-12-06: 1 g via INTRAVENOUS
  Filled 2019-12-06: qty 100

## 2019-12-06 MED ORDER — METHYLPREDNISOLONE SODIUM SUCC 40 MG IJ SOLR: 40.0000 mg | Freq: Every day | INTRAMUSCULAR | Status: DC

## 2019-12-06 MED ORDER — FUROSEMIDE 10 MG/ML IJ SOLN
60.0000 mg | Freq: Two times a day (BID) | INTRAMUSCULAR | Status: DC
  Administered 2019-12-06: 08:00:00 60 mg via INTRAVENOUS

## 2019-12-06 MED ORDER — METOPROLOL TARTRATE 50 MG PO TABS
50.0000 mg | ORAL_TABLET | Freq: Two times a day (BID) | ORAL | Status: DC
  Administered 2019-12-07 – 2019-12-10 (×6): 50 mg via ORAL
  Filled 2019-12-06 (×7): qty 1

## 2019-12-06 MED ORDER — MIDODRINE HCL 5 MG PO TABS
10.0000 mg | ORAL_TABLET | Freq: Four times a day (QID) | ORAL | Status: DC
  Administered 2019-12-06 – 2019-12-07 (×7): 10 mg via ORAL
  Filled 2019-12-06 (×7): qty 2

## 2019-12-06 NOTE — Progress Notes (Signed)
PROGRESS NOTE    Paul Hernandez  KNL:[id number] DOB: [birth year] DOA: 12/03/2019 PCP: Armando Gang, FNP    Brief Narrative:  HPI: Paul Hernandez is a 54 y.o. male with medical history significant of sCHF with EF 20-25%, hypertension, diabetes mellitus, COPD, schizophrenia, who presents with shortness of breath. This is direct admission from clinic, NP Victorino Dike.  Patient states that he has worsening shortness breath since yesterday, which has been progressively worsening.  He has mild dry cough. He denies fever, chills, chest pain.  Patient denies nausea, vomiting, diarrhea, abdominal pain, symptoms of UTI or unilateral weakness. Pt was seen by NP, Victorino Dike at the clinic, an was found to have bilateral leg edema and mild wheezing on auscultation, concerning from combination of COPD and CHF exacerbation.  Patient did not take his medications the past 2 days.   At clinic, oxygen saturation 99% on room air, blood pressure 152/102, heart rate 53, RR 24, temperature normal. We are consulted for direct admission from clinic.  Patient is accepted to telemetry bed as inpatient. Pending covid 19 RVP PCR. Pt is started on BiPAP  2/11: Patient was on BiPAP until early this morning.  Weaned off by respiratory therapy.  Tolerating well.  On 3 L nasal cannula this morning.  Diuresing well.  Patient did have an episode of SVT with ventricular rate up to 170.  Sustained for approximately 30 seconds then reverted back to normal sinus rhythm without intervention.  ICU consulted, discussed with ICU attending.  2/12: Patient seen and examined.  Was on BiPAP overnight again.  Remains in stepdown status.  Hypotension noted overnight requiring initiation of vasopressor therapy.  At time of my evaluation this morning the patient is on 7 mcg of Levophed.  No V. tach noted on telemetry over interval.  Patient appears in good spirits this morning.  Again requesting food and drink.  2/13: Patient seen and examined.  Again  requiring nocturnal BiPAP.  This morning maps look improved however the patient is on 10 mcg of Levophed.  No further cardiac diagnostics per cardiology consultant.  Level of care transfer to ICU for vasopressor monitoring and management.   Assessment & Plan:   Principal Problem:   Acute on chronic systolic CHF (congestive heart failure) (HCC) Active Problems:   COPD exacerbation (HCC)   Schizophrenia (HCC)   Hyperlipidemia, unspecified   Hypertension   Diabetes mellitus without complication (HCC)  Hypotension/shock Patient was started on vasopressors 2/11 Currently on Levophed, 10 mcg as of this note Central access in place Communicated with bedside RN, prioritize diuresis Can hold other blood pressure medications while on pressors to allow more room for diuresis   Acute on chronic systolic CHF (congestive heart failure) (HCC): Nonsustained wide-complex tachyarrhythmia  2D echo on 11/30/2018 showed EF of 20-25%.   Patient has elevated BNP 4350, 3+ leg edema bilaterally chest x-ray with cardiomegaly and vascular testing consistent with CHF exacerbation. Follow-up 2D echocardiogram- EF 20- 25%, consistent with prior Plan: Upgraded ICU status to vasopressor management Continue Lasix 60 mg twice daily Daily weights Strict I's and O's Low-sodium diet 1200 cc fluid restriction Target net -1-1.5L per day Continue Entresto (currently on hold given hypotension/pressors) Continue Coreg (currently on hold given hypotension/pressors) Cardiology consulted, no further ischemic diagnostics indicated at this time Continue telemetry monitoring.  Once able Coreg to be restarted which will help to suppress nonsustained breakthrough dysrhythmias  COPD exacerbation (HCC): -Bronchodilators -As needed Mucinex -Solu-Medrol 40 mg once daily - Anticipate transition to PO steroids  starting tomorrow  Schizophrenia [facility]): -Patient is haldol injection -Benztropine -ativan  prn  Hyperlipidemia, unspecified: -crestor and zetia  HTN:  -Continue home medications  Amlodipine, Coreg -hydralazine prn  Diabetes mellitus without complication (HCC) Most recent A1c 8.3 on 11/29/18, poorly controled.  Patient is taking [place], Metformin, Victoza at home Plan: All oral agents on hold Lantus 10 units daily Sliding scale coverage   DVT prophylaxis: Lovenox Code Status: Full Family Communication: None Disposition Plan: Home, anticipate 24 to 48 hours once respiratory status stabilized patient euvolemic  Consultants:   Intensivist  Procedures:   None  Antimicrobials:   None   Subjective: Patient seen and examined Currently on 5 L nasal cannula [place] appears at baseline Repeatedly requests food and water  Objective: Vitals:   12/06/19 0800 12/06/19 0900 12/06/19 1000 12/06/19 1100  BP:  (!) 129/95 (!) 105/94 (!) 126/92  Pulse: 83 69 90 73  Resp:      Temp:      TempSrc:      SpO2: 93% 100%  100%  Weight:      Height:        Intake/Output Summary (Last 24 hours) at 12/06/2019 1122 Last data filed at 12/06/2019 1100 Gross per 24 hour  Intake 2002.8 ml  Output 2450 ml  Net -447.2 ml   Filed Weights   12/04/19 0500 12/05/19 0421 12/06/19 0500  Weight: 108.7 kg 110.7 kg 110.9 kg    Examination:  General exam: Appears calm and comfortable  Respiratory system: Decreased air movement, scattered mild wheeze, normal respiratory effort, bibasilar crackles Cardiovascular system: S1-S2, no murmurs, 3+ pitting edema bilateral, positive JVD gastrointestinal system: Abdomen is nondistended, soft and nontender. No organomegaly or masses felt. Normal bowel sounds heard. Central nervous system: Alert and oriented. No focal neurological deficits. Extremities: Symmetric 5 x 5 power. Skin: No rashes, lesions or ulcers Psychiatry: Judgement and insight appear normal. Mood & affect appropriate.     Data Reviewed: I have personally reviewed  following labs and imaging studies  CBC: Recent Labs  Lab 12/03/19 1256 12/06/19 0033  WBC 8.4 15.6*  HGB 15.4 13.8  HCT 52.4* 47.3  MCV 89.7 89.9  PLT 308 263   Basic Metabolic Panel: Recent Labs  Lab 12/03/19 1256 12/04/19 0403 12/05/19 0449 12/06/19 0033  NA 141 142 137 139  K 4.7 4.5 4.4 4.4  CL 96* 92* 92* 96*  CO2 34* 40* 37* 38*  GLUCOSE 187* 200* 201* 233*  BUN 17 19 29* 38*  CREATININE 0.97 1.05 1.02 1.25*  CALCIUM 8.9 8.8* 7.8* 7.1*  MG  --  2.0 1.9 1.8  PHOS  --   --  5.2*  --    GFR: Estimated Creatinine Clearance: 88.9 mL/min (A) (by C-G formula based on SCr of 1.25 mg/dL (H)). Liver Function Tests: No results for input(s): AST, ALT, ALKPHOS, BILITOT, PROT, ALBUMIN in the last 168 hours. No results for input(s): LIPASE, AMYLASE in the last 168 hours. No results for input(s): AMMONIA in the last 168 hours. Coagulation Profile: No results for input(s): INR, PROTIME in the last 168 hours. Cardiac Enzymes: No results for input(s): CKTOTAL, CKMB, CKMBINDEX, TROPONINI in the last 168 hours. BNP (last 3 results) No results for input(s): PROBNP in the last 8760 hours. HbA1C: No results for input(s): HGBA1C in the last 72 hours. CBG: Recent Labs  Lab 12/05/19 0728 12/05/19 1129 12/05/19 1612 12/05/19 2208 12/06/19 0725  GLUCAP 204* 321* 237* 179* 297*   Lipid Profile: No results  for input(s): CHOL, HDL, LDLCALC, TRIG, CHOLHDL, LDLDIRECT in the last 72 hours. Thyroid Function Tests: No results for input(s): TSH, T4TOTAL, FREET4, T3FREE, THYROIDAB in the last 72 hours. Anemia Panel: No results for input(s): VITAMINB12, FOLATE, FERRITIN, TIBC, IRON, RETICCTPCT in the last 72 hours. Sepsis Labs: No results for input(s): PROCALCITON, LATICACIDVEN in the last 168 hours.  Recent Results (from the past 240 hour(s))  MRSA PCR Screening     Status: None   Collection Time: 12/03/19 12:24 PM   Specimen: Nasal Mucosa; Nasopharyngeal  Result Value Ref Range  Status   MRSA by PCR NEGATIVE NEGATIVE Final    Comment:        The GeneXpert MRSA Assay (FDA approved for NASAL specimens only), is one component of a comprehensive MRSA colonization surveillance program. It is not intended to diagnose MRSA infection nor to guide or monitor treatment for MRSA infections. Performed at [facility], [address] [place], [place] [address]   Respiratory Panel by RT PCR (Flu A&B, Covid) - Nasopharyngeal Swab     Status: None   Collection Time: 12/03/19 12:24 PM   Specimen: Nasopharyngeal Swab  Result Value Ref Range Status   SARS Coronavirus 2 by RT PCR NEGATIVE NEGATIVE Final    Comment: (NOTE) SARS-CoV-2 target nucleic acids are NOT DETECTED. The SARS-CoV-2 RNA is generally detectable in upper respiratoy specimens during the acute phase of infection. The lowest concentration of SARS-CoV-2 viral copies this assay can detect is 131 copies/mL. A negative result does not preclude SARS-Cov-2 infection and should not be used as the sole basis for treatment or other patient management decisions. A negative result may occur with  improper specimen collection/handling, submission of specimen other than nasopharyngeal swab, presence of viral mutation(s) within the areas targeted by this assay, and inadequate number of viral copies (<131 copies/mL). A negative result must be combined with clinical observations, patient history, and epidemiological information. The expected result is Negative. Fact Sheet for Patients:  https://www.moore.com/ Fact Sheet for Healthcare Providers:  https://www.young.biz/ This test is not yet ap proved or cleared by the [place] FDA and  has been authorized for detection and/or diagnosis of SARS-CoV-2 by FDA under an Emergency Use Authorization (EUA). This EUA will remain  in effect (meaning this test can be used) for the duration of the COVID-19 declaration under  Section 564(b)(1) of the Act, 21 U.S.C. section 360bbb-3(b)(1), unless the authorization is terminated or revoked sooner.    Influenza A by PCR NEGATIVE NEGATIVE Final   Influenza B by PCR NEGATIVE NEGATIVE Final    Comment: (NOTE) The Xpert Xpress SARS-CoV-2/FLU/RSV assay is intended as an aid in  the diagnosis of influenza from Nasopharyngeal swab specimens and  should not be used as a sole basis for treatment. Nasal washings and  aspirates are unacceptable for Xpert Xpress SARS-CoV-2/FLU/RSV  testing. Fact Sheet for Patients: https://www.moore.com/ Fact Sheet for Healthcare Providers: https://www.young.biz/ This test is not yet approved or cleared by the [place] FDA and  has been authorized for detection and/or diagnosis of SARS-CoV-2 by  FDA under an Emergency Use Authorization (EUA). This EUA will remain  in effect (meaning this test can be used) for the duration of the  Covid-19 declaration under Section 564(b)(1) of the Act, 21  U.S.C. section 360bbb-3(b)(1), unless the authorization is  terminated or revoked. Performed at [facility], [address] [place], [place] [address]          Radiology Studies: [facility] Chest [place] 1  View  Result Date: 12/06/2019 CLINICAL DATA:  Pulmonary disease. EXAM: PORTABLE CHEST 1 VIEW COMPARISON:  12/03/2019 FINDINGS: Enlargement of the cardiac silhouette is unchanged. Hazy opacity in the right lung base is unchanged and compatible with a small pleural effusion and airspace opacity. There is mildly increased hazy opacity in the left lung base compared to the prior study as well which may also reflect in part a small pleural effusion. No pneumothorax is identified. A right PICC has been placed in terminates over the mid SVC. IMPRESSION: Unchanged right and new left basilar opacities likely reflecting small pleural effusions and atelectasis or infiltrate. Electronically Signed   By: Logan Bores  M.D.   On: 12/06/2019 09:50   [place] EKG SITE RITE  Result Date: 12/05/2019 If Site Rite image not attached, placement could not be confirmed due to current cardiac rhythm.       Scheduled Meds: . chlorhexidine  15 mL Mouth Rinse BID  . Chlorhexidine Gluconate Cloth  6 each Topical Daily  . enoxaparin (LOVENOX) injection  40 mg Subcutaneous Q24H  . folic acid  1 mg Oral Daily  . furosemide  60 mg Intravenous Q12H  . insulin aspart  0-5 Units Subcutaneous QHS  . insulin aspart  0-9 Units Subcutaneous TID WC  . insulin glargine  15 Units Subcutaneous Daily  . ipratropium-albuterol  3 mL Nebulization Q6H  . levofloxacin  500 mg Oral Q24H  . mouth rinse  15 mL Mouth Rinse q12n4p  . methylPREDNISolone (SOLU-MEDROL) injection  40 mg Intravenous Q12H  . metoprolol tartrate  50 mg Oral BID  . multivitamin with minerals  1 tablet Oral Daily  . thiamine  100 mg Oral Daily   Continuous Infusions: . sodium chloride Stopped (12/06/19 1003)  . norepinephrine (LEVOPHED) Adult infusion 8 mcg/min (12/06/19 1100)     LOS: 3 days    Time spent: 35 minutes    Sidney Ace, MD [facility] Pager [phone]  If 7PM-7AM, please contact night-coverage  12/06/2019, 11:22 AM

## 2019-12-06 NOTE — Plan of Care (Signed)

## 2019-12-06 NOTE — Progress Notes (Signed)
CRITICAL CARE PROGRESS NOTE    Name: Paul Hernandez MRN: [id number] DOB: [birth year]     LOS: 3 Referring physician: Dr Georgeann Oppenheim     SUBJECTIVE FINDINGS & SIGNIFICANT EVENTS   Patient description:  Patient with advanced systolic CHF with EF of 20 to 18%, schizoaffective disorder, lifelong smoking with COPD admitted due to progressively worsening dyspnea and shortness of breath found to have severe lower extremity edema as well as phlegm production and oxygen desaturation suggestive of concomitant COPD with CHF exacerbation.  Patient was initially brought to the unit with a stepdown service on hospitalist team however was noted to have tachycardic episodes with narrow complex tachyarrhythmia over 150 BMP which prompted critical care consultation   Lines / Drains: PIVx2  Cultures / Sepsis markers: COVID negative Flu A & B negative  Antibiotics: Levoquin 500 po daily for AECOPD   Protocols / Consultants: PCCM and Hospitalist    Events:  12/04/19 - SVT episodes >160HR, self resolved after few minutes 12/05/19- patient is on levophed with borderline low BP 12/06/19- continues to require levophed for BP support, have stopped lasix temporarily in lieu of shock physiology   PAST MEDICAL HISTORY   Past Medical History:  Diagnosis Date  . CHF (congestive heart failure) (HCC) 12/05/2019  . COPD (chronic obstructive pulmonary disease) (HCC) 12/05/2019  . Diabetes mellitus without complication (HCC) 12/05/2019  . Hypertension 12/05/2019  . Mental health disorder 12/05/2019     SURGICAL HISTORY   Past Surgical History:  Procedure Laterality Date  . NO PAST SURGERIES       FAMILY HISTORY   Family History  Problem Relation Age of Onset  . CVA Mother   . CAD Father      SOCIAL HISTORY   Social  History   Tobacco Use  . Smoking status: Former Smoker    Packs/day: 1.00    Years: 35.00    Pack years: 35.00    Types: Cigarettes    Start date: 09/07/2019  . Smokeless tobacco: Never Used  Substance Use Topics  . Alcohol use: No  . Drug use: No     MEDICATIONS   Current Medication:  Current Facility-Administered Medications:  .  0.9 %  sodium chloride infusion, , Intravenous, PRN, Georgeann Oppenheim, Sudheer B, MD, Last Rate: 10 mL/hr at 12/06/19 1200, Rate Verify at 12/06/19 1200 .  albuterol (PROVENTIL) (2.5 MG/3ML) 0.083% nebulizer solution 2.5 mg, 2.5 mg, Nebulization, Q4H PRN, Bertram Savin, RPH, 2.5 mg at 12/03/19 1704 .  benztropine (COGENTIN) tablet 2 mg, 2 mg, Oral, QHS PRN, Lorretta Harp, MD, 2 mg at 12/03/19 2230 .  chlorhexidine (PERIDEX) 0.12 % solution 15 mL, 15 mL, Mouth Rinse, BID, Lorretta Harp, MD, 15 mL at 12/06/19 0947 .  Chlorhexidine Gluconate Cloth 2 % PADS 6 each, 6 each, Topical, Daily, Lorretta Harp, MD, 6 each at 12/05/19 [phone] .  diphenhydrAMINE (BENADRYL) capsule 25 mg, 25 mg, Oral, QHS PRN, Lorretta Harp, MD .  enoxaparin (LOVENOX) injection 40 mg, 40 mg, Subcutaneous, Q24H, Lorretta Harp, MD, 40 mg at 12/05/19 1758 .  folic acid (FOLVITE) tablet 1 mg, 1 mg, Oral, Daily, Chawn Spraggins, MD, 1 mg at 12/06/19 0946 .  insulin aspart (novoLOG) injection 0-5 Units, 0-5 Units, Subcutaneous, QHS, Lorretta Harp, MD, 2 Units at 12/03/19 2144 .  insulin aspart (novoLOG) injection 0-9 Units, 0-9 Units, Subcutaneous, TID WC, Lorretta Harp, MD, 7 Units at 12/06/19 1138 .  insulin glargine (LANTUS) injection 15 Units, 15 Units, Subcutaneous, Daily, Aero Drummonds,  MD, 15 Units at 12/06/19 1003 .  ipratropium-albuterol (DUONEB) 0.5-2.5 (3) MG/3ML nebulizer solution 3 mL, 3 mL, Nebulization, Q6H, Bertram Savin, RPH, 3 mL at 12/06/19 0714 .  levofloxacin (LEVAQUIN) tablet 500 mg, 500 mg, Oral, Q24H, Karna Christmas, Duell Holdren, MD, 500 mg at 12/05/19 1757 .  LORazepam (ATIVAN) tablet 0.5 mg, 0.5  mg, Oral, Q4H PRN, Lorretta Harp, MD .  MEDLINE mouth rinse, 15 mL, Mouth Rinse, q12n4p, Lorretta Harp, MD, 15 mL at 12/05/19 1759 .  [START ON 12/07/2019] methylPREDNISolone sodium succinate (SOLU-MEDROL) 40 mg/mL injection 40 mg, 40 mg, Intravenous, Daily, Sreenath, Sudheer B, MD .  metoprolol tartrate (LOPRESSOR) tablet 50 mg, 50 mg, Oral, BID, Lamar Blinks, MD, Stopped at 12/06/19 [phone] .  multivitamin with minerals tablet 1 tablet, 1 tablet, Oral, Daily, Vida Rigger, MD, 1 tablet at 12/06/19 0946 .  norepinephrine (LEVOPHED) 4mg  in premix infusion, 2-10 mcg/min, Intravenous, Titrated, Blakeney, , NP, Last Rate: 22.5 mL/hr at 12/06/19 1200, 6 mcg/min at 12/06/19 1200 .  thiamine tablet 100 mg, 100 mg, Oral, Daily, 12/08/19, Yaniyah Koors, MD, 100 mg at 12/06/19 0946    ALLERGIES   Asa [aspirin]    REVIEW OF SYSTEMS     10 point ROS conducted and is negative except as per subjective findings  PHYSICAL EXAMINATION   Vital Signs: Temp:  [98.3 F (36.8 C)-98.9 F (37.2 C)] 98.9 F (37.2 C) (02/13 1200) Pulse Rate:  [38-104] 70 (02/13 1200) BP: (63-151)/(39-113) 119/84 (02/13 1200) SpO2:  [87 %-100 %] 100 % (02/13 1200) FiO2 (%):  [40 %] 40 % (02/13 0348) Weight:  [110.9 kg] 110.9 kg (02/13 0500)  GENERAL: Chronically ill-appearing HEAD: Normocephalic, atraumatic.  EYES: Pupils equal, round, reactive to light.  No scleral icterus.  MOUTH: Moist mucosal membrane. NECK: Supple. No thyromegaly. No nodules. No JVD.  PULMONARY: Decreased breath sounds at the bases bilaterally CARDIOVASCULAR: S1 and S2. Regular rate and rhythm. No murmurs, rubs, or gallops.  GASTROINTESTINAL: Soft, nontender, non-distended. No masses. Positive bowel sounds. No hepatosplenomegaly.  MUSCULOSKELETAL: 2+ lower extremity edema with mild clubbing NEUROLOGIC: Mild distress due to acute illness SKIN:intact,warm,dry   PERTINENT DATA     Infusions: . sodium chloride 10 mL/hr at 12/06/19 1200    . norepinephrine (LEVOPHED) Adult infusion 6 mcg/min (12/06/19 1200)   Scheduled Medications: . chlorhexidine  15 mL Mouth Rinse BID  . Chlorhexidine Gluconate Cloth  6 each Topical Daily  . enoxaparin (LOVENOX) injection  40 mg Subcutaneous Q24H  . folic acid  1 mg Oral Daily  . insulin aspart  0-5 Units Subcutaneous QHS  . insulin aspart  0-9 Units Subcutaneous TID WC  . insulin glargine  15 Units Subcutaneous Daily  . ipratropium-albuterol  3 mL Nebulization Q6H  . levofloxacin  500 mg Oral Q24H  . mouth rinse  15 mL Mouth Rinse q12n4p  . [START ON 12/07/2019] methylPREDNISolone (SOLU-MEDROL) injection  40 mg Intravenous Daily  . metoprolol tartrate  50 mg Oral BID  . multivitamin with minerals  1 tablet Oral Daily  . thiamine  100 mg Oral Daily   PRN Medications: sodium chloride, albuterol, benztropine, diphenhydrAMINE, LORazepam Hemodynamic parameters:   Intake/Output: 02/12 0701 - 02/13 0700 In: 1502.1 [P.O.:480; I.V.:972.1; IV Piggyback:50] Out: 1050 [Urine:1050]  Ventilator  Settings: FiO2 (%):  [40 %] 40 %    LAB RESULTS:  Basic Metabolic Panel: Recent Labs  Lab 12/03/19 1256 12/03/19 1256 12/04/19 0403 12/04/19 0403 12/05/19 0449 12/06/19 0033  NA 141  --  142  --  137 139  K 4.7   < > 4.5   < > 4.4 4.4  CL 96*  --  92*  --  92* 96*  CO2 34*  --  40*  --  37* 38*  GLUCOSE 187*  --  200*  --  201* 233*  BUN 17  --  19  --  29* 38*  CREATININE 0.97  --  1.05  --  1.02 1.25*  CALCIUM 8.9  --  8.8*  --  7.8* 7.1*  MG  --   --  2.0  --  1.9 1.8  PHOS  --   --   --   --  5.2*  --    < > = values in this interval not displayed.   Liver Function Tests: No results for input(s): AST, ALT, ALKPHOS, BILITOT, PROT, ALBUMIN in the last 168 hours. No results for input(s): LIPASE, AMYLASE in the last 168 hours. No results for input(s): AMMONIA in the last 168 hours. CBC: Recent Labs  Lab 12/03/19 1256 12/06/19 0033  WBC 8.4 15.6*  HGB 15.4 13.8  HCT 52.4*  47.3  MCV 89.7 89.9  PLT 308 263   Cardiac Enzymes: No results for input(s): CKTOTAL, CKMB, CKMBINDEX, TROPONINI in the last 168 hours. BNP: Invalid input(s): POCBNP CBG: Recent Labs  Lab 12/05/19 1129 12/05/19 1612 12/05/19 2208 12/06/19 0725 12/06/19 1128  GLUCAP 321* 237* 179* 297* 316*     IMAGING RESULTS:  Imaging: DG Chest Port 1 View  Result Date: 12/06/2019 CLINICAL DATA:  Pulmonary disease. EXAM: PORTABLE CHEST 1 VIEW COMPARISON:  12/03/2019 FINDINGS: Enlargement of the cardiac silhouette is unchanged. Hazy opacity in the right lung base is unchanged and compatible with a small pleural effusion and airspace opacity. There is mildly increased hazy opacity in the left lung base compared to the prior study as well which may also reflect in part a small pleural effusion. No pneumothorax is identified. A right PICC has been placed in terminates over the mid SVC. IMPRESSION: Unchanged right and new left basilar opacities likely reflecting small pleural effusions and atelectasis or infiltrate. Electronically Signed   By: Logan Bores M.D.   On: 12/06/2019 09:50   [place] EKG SITE RITE  Result Date: 12/05/2019 If Site Rite image not attached, placement could not be confirmed due to current cardiac rhythm.     ASSESSMENT AND PLAN    -Multidisciplinary rounds held today  Acute Hypoxic Respiratory Failure -due to acute decompensated systolic CHF with EF 20 to 25% and concurrent moderate to severe acute COPD exacerbation -continue BiPAP support as needed -continue Bronchodilator Therapy -Continue Levaquin 500 p.o. daily -Aggressive bronchopulmonary hygiene-we will initiate vest therapy with incentive spirometer and Acapella device   Acute decompensated systolic CHF with EF of 20 to 25% -Clinically improved status post diuresis with net 3600 mL diuresis thus far -Agree with current Lasix 60 IV twice daily -Strict I's and O's-please document urine output -Fluid restriction of  1200 cc per 24 hours -PT OT -BiPAP nightly ICU telemetry monitoring  -because we are diuresing patient and he is on levophed I will hold beta blockade and antihypertensives for now -will start solucortef today with midordrine to support pressure and expedite diuresis   Narrow complex tachyarrhythmia  -Patient had multiple episodes of SVT which have self resolved  -Continue ICU telemetry monitoring  -Immediate electrolyte repletion while on aggressive diuresis -Cardiac biomarkers -Repeat echo completed today-final report pending   Extensive psychiatric history -  Schizophrenia -Continue home regimen -Consider psychiatry evaluation if necessary   GI/Nutrition GI PROPHYLAXIS as indicated DIET-->TF's as tolerated Constipation protocol as indicated  ENDO - ICU hypoglycemic\Hyperglycemia protocol -check FSBS per protocol   ELECTROLYTES -follow labs as needed -replace as needed -pharmacy consultation   DVT/GI PRX ordered -SCDs  TRANSFUSIONS AS NEEDED MONITOR FSBS ASSESS the need for LABS as needed   Critical care provider statement:    Critical care time (minutes):  33   Critical care time was exclusive of:  Separately billable procedures and treating other patients   Critical care was necessary to treat or prevent imminent or life-threatening deterioration of the following conditions:   Acute hypoxemic respiratory failure, acute decompensated systolic CHF exacerbation, acute moderate to severe COPD exacerbation, multiple comorbid conditions   Critical care was time spent personally by me on the following activities:  Development of treatment plan with patient or surrogate, discussions with consultants, evaluation of patient's response to treatment, examination of patient, obtaining history from patient or surrogate, ordering and performing treatments and interventions, ordering and review of laboratory studies and re-evaluation of patient's condition.  I assumed direction of  critical care for this patient from another provider in my specialty: no    This document was prepared using Dragon voice recognition software and may include unintentional dictation errors.    Vida Rigger, M.D.  Division of Pulmonary & Critical Care Medicine  [facility]

## 2019-12-06 NOTE — Progress Notes (Signed)
Shift summary:  - Levophed minimized as tolerated. - More alert and communicative today. - Adequate UOP.

## 2019-12-06 NOTE — Progress Notes (Addendum)
Pharmacy Electrolyte Monitoring Consult:  Pharmacy consulted to assist in monitoring and replacing electrolytes in this 54 y.o. male admitted on 12/03/2019. Patient currently ordered furosemide 60mg  IV BID and peripheral norepinephrine.   Labs:  Sodium (mmol/L)  Date Value  12/06/2019 139   Potassium (mmol/L)  Date Value  12/06/2019 4.4   Magnesium (mg/dL)  Date Value  12/08/2019 1.8   Phosphorus (mg/dL)  Date Value  07/35/4301 5.2 (H)   Calcium (mg/dL)  Date Value  48/40/3979 7.1 (L)   Albumin (g/dL)  Date Value  53/69/2230 3.2 (L)   Corrected Calcium: 8.4   Assessment/Plan: Patient ordered magnesium 1g IV x 1 this am by MD  Per consult, will replace to maintain potassium > 4 and magnesium > 2.   BMP/Magnesium with am labs.   Pharmacy will continue to monitor an adjust per consult.   09/79/4997, PharmD, BCPS 12/06/2019 9:36 AM

## 2019-12-06 NOTE — Progress Notes (Signed)
[facility] Cardiology [facility] Encounter Note  Patient: Paul Hernandez / Admit Date: 12/03/2019 / Date of Encounter: 12/06/2019, 8:16 AM   Subjective: Patient feels relatively well today no evidence of significant new symptoms of chest pain or shortness of breath.  Patient's slowly improving from COPD and lower extremity edema and acute on chronic systolic dysfunction heart failure with Lasix.  Patient did have multiple runs of wide-complex tachycardia yesterday and currently in normal sinus rhythm at 100 bpm today.  This may be secondary to cardiomyopathy requiring further medication management.  Patient did not have any symptoms suggesting syncope dizziness  Review of Systems: Positive for: None Negative for: Vision change, hearing change, syncope, dizziness, nausea, vomiting,diarrhea, bloody stool, stomach pain, cough, congestion, diaphoresis, urinary frequency, urinary pain,skin lesions, skin rashes Others previously listed  Objective: Telemetry: Normal sinus rhythm Physical Exam: Blood pressure (!) 151/113, pulse (!) 104, temperature 98.3 F (36.8 C), resp. rate (!) 21, height 6' (1.829 m), weight 110.9 kg, SpO2 94 %. Body mass index is 33.16 kg/m. General: Well developed, well nourished, in no acute distress. Head: Normocephalic, atraumatic, sclera non-icteric, no xanthomas, nares are without discharge. Neck: No apparent masses Lungs: Normal respirations with few wheezes, no rhonchi, no rales , basilar crackles   Heart: Regular rate and rhythm, normal S1 S2, no murmur, no rub, no gallop, PMI is normal size and placement, carotid upstroke normal without bruit, jugular venous pressure normal Abdomen: Soft, non-tender, non-distended with normoactive bowel sounds. No hepatosplenomegaly. Abdominal aorta is normal size without bruit Extremities: 1-2+ edema, no clubbing, no cyanosis, no ulcers,  Peripheral: 2+ radial, 2+ femoral, 2+ dorsal pedal pulses Neuro: Alert and oriented. Moves all  extremities spontaneously. Psych:  Responds to questions appropriately with a normal affect.   Intake/Output Summary (Last 24 hours) at 12/06/2019 0816 Last data filed at 12/06/2019 0700 Gross per 24 hour  Intake 1502.05 ml  Output 1050 ml  Net 452.05 ml    Inpatient Medications:  . chlorhexidine  15 mL Mouth Rinse BID  . Chlorhexidine Gluconate Cloth  6 each Topical Daily  . enoxaparin (LOVENOX) injection  40 mg Subcutaneous Q24H  . folic acid  1 mg Oral Daily  . furosemide  60 mg Intravenous Q12H  . insulin aspart  0-5 Units Subcutaneous QHS  . insulin aspart  0-9 Units Subcutaneous TID WC  . insulin glargine  15 Units Subcutaneous Daily  . ipratropium-albuterol  3 mL Nebulization Q6H  . levofloxacin  500 mg Oral Q24H  . mouth rinse  15 mL Mouth Rinse q12n4p  . methylPREDNISolone (SOLU-MEDROL) injection  40 mg Intravenous Q12H  . multivitamin with minerals  1 tablet Oral Daily  . thiamine  100 mg Oral Daily   Infusions:  . sodium chloride 250 mL (12/06/19 0731)  . norepinephrine (LEVOPHED) Adult infusion 10 mcg/min (12/06/19 0700)    Labs: Recent Labs    12/05/19 0449 12/06/19 0033  NA 137 139  K 4.4 4.4  CL 92* 96*  CO2 37* 38*  GLUCOSE 201* 233*  BUN 29* 38*  CREATININE 1.02 1.25*  CALCIUM 7.8* 7.1*  MG 1.9 1.8  PHOS 5.2*  --    No results for input(s): AST, ALT, ALKPHOS, BILITOT, PROT, ALBUMIN in the last 72 hours. Recent Labs    12/03/19 1256 12/06/19 0033  WBC 8.4 15.6*  HGB 15.4 13.8  HCT 52.4* 47.3  MCV 89.7 89.9  PLT 308 263   No results for input(s): CKTOTAL, CKMB, TROPONINI in the last 72  hours. Invalid input(s): POCBNP No results for input(s): HGBA1C in the last 72 hours.   Weights: Filed Weights   12/04/19 0500 12/05/19 0421 12/06/19 0500  Weight: 108.7 kg 110.7 kg 110.9 kg     Radiology/Studies:  DG Chest Port 1 View  Result Date: 12/03/2019 CLINICAL DATA:  Shortness of breath EXAM: PORTABLE CHEST 1 VIEW COMPARISON:  07/08/2019  FINDINGS: Cardiomegaly with vascular congestion. Small right pleural effusion. Right basilar atelectasis or infiltrate. Left lung clear. No edema. No acute bony abnormality. IMPRESSION: Cardiomegaly with vascular congestion. Small right effusion with right base atelectasis or infiltrate. Electronically Signed   By: Charlett Nose M.D.   On: 12/03/2019 15:06   ECHOCARDIOGRAM COMPLETE  Result Date: 12/04/2019    ECHOCARDIOGRAM REPORT   Patient Name:   Paul Hernandez Date of Exam: 12/04/2019 Medical Rec #:  [id number]     Height:       72.0 in Accession #:    [id number]    Weight:       239.6 lb Date of Birth:  Aug 30, 1966    BSA:          2.30 m Patient Age:    54 years      BP:           106/91 mmHg Patient Gender: M             HR:           77 bpm. Exam Location:  [facility] Procedure: 2D Echo, Color Doppler, Cardiac Doppler and Intracardiac            Opacification Agent Indications:     I50.31 CHF-Acute Diastolic  History:         Patient has prior history of Echocardiogram examinations. CHF,                  COPD; Risk Factors:Hypertension and Diabetes.  Sonographer:     Humphrey Rolls RDCS (AE) Referring Phys:  [id number] Brien Few NIU Diagnosing Phys: Julien Nordmann MD IMPRESSIONS  1. Left ventricular ejection fraction, by estimation, is 20 to 25%. Left ventricular ejection fraction by PLAX is 19 %. The left ventricle has severely decreased function. The left ventrical demonstrates global hypokinesis. The left ventricular internal  cavity size was severely dilated. Left ventricular diastolic parameters are consistent with Grade I diastolic dysfunction (impaired relaxation).  2. Right ventricular systolic function is moderately reduced. The right ventricular size is mildly enlarged. There is normal pulmonary artery systolic pressure. FINDINGS  Left Ventricle: Left ventricular ejection fraction, by estimation, is 20 to 25%. Left ventricular ejection fraction by PLAX is 19 % The left ventricle has severely decreased function. The  left ventricle demonstrates global hypokinesis. Definity contrast  agent was given IV to delineate the left ventricular endocardial borders. The left ventricular internal cavity size was severely dilated. There is no left ventricular hypertrophy. Left ventricular diastolic parameters are consistent with Grade I diastolic dysfunction (impaired relaxation). Right Ventricle: The right ventricular size is mildly enlarged. No increase in right ventricular wall thickness. Right ventricular systolic function is moderately reduced. There is normal pulmonary artery systolic pressure. The tricuspid regurgitant velocity is 2.06 m/s, and with an assumed right atrial pressure of 10 mmHg, the estimated right ventricular systolic pressure is 27.0 mmHg. Left Atrium: Left atrial size was normal in size. Right Atrium: Right atrial size was normal in size. Pericardium: There is no evidence of pericardial effusion. Mitral Valve: The mitral valve is normal in structure and function. Normal mobility  of the mitral valve leaflets. Mild mitral valve regurgitation. No evidence of mitral valve stenosis. MV peak gradient, 1.9 mmHg. The mean mitral valve gradient is 1.0 mmHg. Tricuspid Valve: The tricuspid valve is normal in structure. Tricuspid valve regurgitation is not demonstrated. No evidence of tricuspid stenosis. Aortic Valve: The aortic valve is normal in structure and function. Aortic valve regurgitation is not visualized. Mild to moderate aortic valve sclerosis/calcification is present, without any evidence of aortic stenosis. Aortic valve mean gradient measures 4.0 mmHg. Aortic valve peak gradient measures 7.7 mmHg. Aortic valve area, by VTI measures 2.01 cm. Pulmonic Valve: The pulmonic valve was normal in structure. Pulmonic valve regurgitation is not visualized. No evidence of pulmonic stenosis. Aorta: The aortic root is normal in size and structure. Venous: The inferior vena cava is normal in size with greater than 50%  respiratory variability, suggesting right atrial pressure of 3 mmHg. The inferior vena cava and the hepatic vein show a normal flow pattern. IAS/Shunts: No atrial level shunt detected by color flow Doppler.  LEFT VENTRICLE PLAX 2D LV EF:         Left            Diastology                ventricular     LV e' lateral:   5.00 cm/s                ejection        LV E/e' lateral: 14.0                fraction by                PLAX is 19                % LVIDd:         6.14 cm LVIDs:         5.60 cm LV PW:         1.11 cm LV IVS:        0.92 cm LVOT diam:     2.20 cm LV SV:         46.00 ml LV SV Index:   15.15 LVOT Area:     3.80 cm  RIGHT VENTRICLE RV Basal diam:  4.20 cm TAPSE (M-mode): 1.6 cm LEFT ATRIUM             Index       RIGHT ATRIUM           Index LA diam:        3.80 cm 1.65 cm/m  RA Area:     24.20 cm LA Vol (A2C):   57.7 ml 25.08 ml/m RA Volume:   78.00 ml  33.90 ml/m LA Vol (A4C):   48.0 ml 20.86 ml/m LA Biplane Vol: 55.7 ml 24.21 ml/m  AORTIC VALVE                   PULMONIC VALVE AV Area (Vmax):    2.73 cm    PV Vmax:       0.86 m/s AV Area (Vmean):   2.13 cm    PV Vmean:      54.200 cm/s AV Area (VTI):     2.01 cm    PV VTI:        0.121 m AV Vmax:           139.00 cm/s PV Peak grad:  3.0 mmHg AV Vmean:  94.700 cm/s PV Mean grad:  1.0 mmHg AV VTI:            0.229 m AV Peak Grad:      7.7 mmHg AV Mean Grad:      4.0 mmHg LVOT Vmax:         99.80 cm/s LVOT Vmean:        53.100 cm/s LVOT VTI:          0.121 m LVOT/AV VTI ratio: 0.53  AORTA Ao Root diam: 3.30 cm MITRAL VALVE                        TRICUSPID VALVE MV Area (PHT): 5.81 cm             TR Peak grad:   17.0 mmHg MV Peak grad:  1.9 mmHg             TR Vmax:        206.00 cm/s MV Mean grad:  1.0 mmHg MV Vmax:       0.69 m/s             SHUNTS MV Vmean:      48.5 cm/s            Systemic VTI:  0.12 m MV Decel Time: 131 msec             Systemic Diam: 2.20 cm MV E velocity: 69.85 cm/s 103 cm/s MV A velocity: 73.00 cm/s 70.3 cm/s  MV E/A ratio:  0.96       1.5 Ida Rogue MD Electronically signed by Ida Rogue MD Signature Date/Time: 12/04/2019/6:12:28 PM    Final    [place] EKG SITE RITE  Result Date: 12/05/2019 If Site Rite image not attached, placement could not be confirmed due to current cardiac rhythm.    Assessment and Recommendation  54 y.o. male with known chronic systolic dysfunction congestive heart failure with acute on chronic systolic dysfunction congestive heart failure with elevated BNP and pulmonary edema slightly improved with appropriate medication management including intravenous Lasix and no current evidence of myocardial infarction.  Patient does have some wide-complex tachycardia needing further medication management 1.  Continue acute on chronic systolic dysfunction congestive heart failure treatment with intravenous Lasix continuing to improve hypoxia and edema 2.  No further cardiac diagnostics necessary at this time 3.  Treatment of wide-complex tachycardia cardiomyopathy with beta-blocker and increased dose as necessary for heart rate between 60 and 70 bpm at baseline and reduction of episodes of tachycardia 4.  Continue treatment of any hypoxia or other lung disease exacerbation as per pulmonology 5.  Okay for transfer to telemetry  Signed, Serafina Royals M.D. FACC

## 2019-12-07 LAB — BASIC METABOLIC PANEL
Anion gap: 8 (ref 5–15)
BUN: 26 mg/dL — ABNORMAL HIGH (ref 6–20)
CO2: 38 mmol/L — ABNORMAL HIGH (ref 22–32)
Calcium: 7.7 mg/dL — ABNORMAL LOW (ref 8.9–10.3)
Chloride: 94 mmol/L — ABNORMAL LOW (ref 98–111)
Creatinine, Ser: 0.82 mg/dL (ref 0.61–1.24)
GFR calc Af Amer: >60 mL/min (ref >60)
GFR calc non Af Amer: >60 mL/min (ref >60)
Glucose, Bld: 165 mg/dL — ABNORMAL HIGH (ref 70–99)
Potassium: 5.3 mmol/L — ABNORMAL HIGH (ref 3.5–5.1)
Sodium: 140 mmol/L (ref 135–145)

## 2019-12-07 LAB — GLUCOSE, CAPILLARY
Glucose-Capillary: 154 mg/dL — ABNORMAL HIGH (ref 70–99)
Glucose-Capillary: 253 mg/dL — ABNORMAL HIGH (ref 70–99)
Glucose-Capillary: 197 mg/dL — ABNORMAL HIGH (ref 70–99)
Glucose-Capillary: 224 mg/dL — ABNORMAL HIGH (ref 70–99)

## 2019-12-07 LAB — MAGNESIUM: Magnesium: 2.1 mg/dL (ref 1.7–2.4)

## 2019-12-07 MED ORDER — SODIUM CHLORIDE 0.9% FLUSH
3.0000 mL | Freq: Two times a day (BID) | INTRAVENOUS | Status: DC
  Administered 2019-12-07 – 2019-12-10 (×6): 3 mL via INTRAVENOUS

## 2019-12-07 NOTE — Progress Notes (Signed)
Patient with noted SVT-160's and converted back to Sinus Rhythm in the 70-80's immediately. Patient with BP of 126/95. Jeri Modena NP made aware. RN will continue to monitor patient.

## 2019-12-07 NOTE — Progress Notes (Signed)
[facility] Cardiology [facility] Encounter Note  Patient: Paul Hernandez / Admit Date: 12/03/2019 / Date of Encounter: 12/07/2019, 6:55 AM   Subjective: Patient feels relatively well today no evidence of significant new symptoms of chest pain or shortness of breath.  Patient overall rested comfortably overnight patient's slowly improving from COPD and lower extremity edema and acute on chronic systolic dysfunction heart failure with Lasix.  Patient did have multiple runs of wide-complex tachycardia yesterday but it appears that this is less frequent in the last 24 hours than before and currently in normal sinus rhythm at 100 bpm today.  This may be secondary to cardiomyopathy requiring further medication management.  Patient did not have any symptoms suggesting syncope dizziness Echocardiogram reestablished patient has a severe LV systolic dysfunction with ejection fraction of 20% Review of Systems: Positive for: None Negative for: Vision change, hearing change, syncope, dizziness, nausea, vomiting,diarrhea, bloody stool, stomach pain, cough, congestion, diaphoresis, urinary frequency, urinary pain,skin lesions, skin rashes Others previously listed  Objective: Telemetry: Normal sinus rhythm Physical Exam: Blood pressure (!) 126/95, pulse 70, temperature 98.5 F (36.9 C), temperature source Oral, resp. rate (!) 25, height 6' (1.829 m), weight 110.9 kg, SpO2 98 %. Body mass index is 33.16 kg/m. General: Well developed, well nourished, in no acute distress. Head: Normocephalic, atraumatic, sclera non-icteric, no xanthomas, nares are without discharge. Neck: No apparent masses Lungs: Normal respirations with few wheezes, no rhonchi, no rales , basilar crackles   Heart: Regular rate and rhythm, normal S1 S2, no murmur, no rub, no gallop, PMI is normal size and placement, carotid upstroke normal without bruit, jugular venous pressure normal Abdomen: Soft, non-tender, non-distended with normoactive  bowel sounds. No hepatosplenomegaly. Abdominal aorta is normal size without bruit Extremities: 1-2+ edema, no clubbing, no cyanosis, no ulcers,  Peripheral: 2+ radial, 2+ femoral, 2+ dorsal pedal pulses Neuro: Alert and oriented. Moves all extremities spontaneously. Psych:  Responds to questions appropriately with a normal affect.   Intake/Output Summary (Last 24 hours) at 12/07/2019 0655 Last data filed at 12/07/2019 0300 Gross per 24 hour  Intake 1451.74 ml  Output 1720 ml  Net -268.26 ml    Inpatient Medications:  . chlorhexidine  15 mL Mouth Rinse BID  . Chlorhexidine Gluconate Cloth  6 each Topical Daily  . enoxaparin (LOVENOX) injection  40 mg Subcutaneous Q24H  . folic acid  1 mg Oral Daily  . hydrocortisone sod succinate (SOLU-CORTEF) inj  100 mg Intravenous Q12H  . insulin aspart  0-5 Units Subcutaneous QHS  . insulin aspart  0-9 Units Subcutaneous TID WC  . insulin glargine  15 Units Subcutaneous Daily  . ipratropium-albuterol  3 mL Nebulization Q6H  . levofloxacin  500 mg Oral Q24H  . mouth rinse  15 mL Mouth Rinse q12n4p  . metoprolol tartrate  50 mg Oral BID  . midodrine  10 mg Oral 4x daily  . multivitamin with minerals  1 tablet Oral Daily  . thiamine  100 mg Oral Daily   Infusions:  . sodium chloride 10 mL/hr at 12/06/19 1700  . norepinephrine (LEVOPHED) Adult infusion 2 mcg/min (12/06/19 1700)    Labs: Recent Labs    12/05/19 0449 12/05/19 0449 12/06/19 0033 12/07/19 0457  NA 137   < > 139 140  K 4.4   < > 4.4 5.3*  CL 92*   < > 96* 94*  CO2 37*   < > 38* 38*  GLUCOSE 201*   < > 233* 165*  BUN 29*   < >  38* 26*  CREATININE 1.02   < > 1.25* 0.82  CALCIUM 7.8*   < > 7.1* 7.7*  MG 1.9   < > 1.8 2.1  PHOS 5.2*  --   --   --    < > = values in this interval not displayed.   No results for input(s): AST, ALT, ALKPHOS, BILITOT, PROT, ALBUMIN in the last 72 hours. Recent Labs    12/06/19 0033  WBC 15.6*  HGB 13.8  HCT 47.3  MCV 89.9  PLT 263    No results for input(s): CKTOTAL, CKMB, TROPONINI in the last 72 hours. Invalid input(s): POCBNP No results for input(s): HGBA1C in the last 72 hours.   Weights: Filed Weights   12/04/19 0500 12/05/19 0421 12/06/19 0500  Weight: 108.7 kg 110.7 kg 110.9 kg     Radiology/Studies:  DG Chest Port 1 View  Result Date: 12/06/2019 CLINICAL DATA:  Pulmonary disease. EXAM: PORTABLE CHEST 1 VIEW COMPARISON:  12/03/2019 FINDINGS: Enlargement of the cardiac silhouette is unchanged. Hazy opacity in the right lung base is unchanged and compatible with a small pleural effusion and airspace opacity. There is mildly increased hazy opacity in the left lung base compared to the prior study as well which may also reflect in part a small pleural effusion. No pneumothorax is identified. A right PICC has been placed in terminates over the mid SVC. IMPRESSION: Unchanged right and new left basilar opacities likely reflecting small pleural effusions and atelectasis or infiltrate. Electronically Signed   By: Sebastian Ache M.D.   On: 12/06/2019 09:50   DG Chest Port 1 View  Result Date: 12/03/2019 CLINICAL DATA:  Shortness of breath EXAM: PORTABLE CHEST 1 VIEW COMPARISON:  07/08/2019 FINDINGS: Cardiomegaly with vascular congestion. Small right pleural effusion. Right basilar atelectasis or infiltrate. Left lung clear. No edema. No acute bony abnormality. IMPRESSION: Cardiomegaly with vascular congestion. Small right effusion with right base atelectasis or infiltrate. Electronically Signed   By: Charlett Nose M.D.   On: 12/03/2019 15:06   ECHOCARDIOGRAM COMPLETE  Result Date: 12/04/2019    ECHOCARDIOGRAM REPORT   Patient Name:   Paul Hernandez Date of Exam: 12/04/2019 Medical Rec #:  [id number]     Height:       72.0 in Accession #:    [id number]    Weight:       239.6 lb Date of Birth:  1966/01/30    BSA:          2.30 m Patient Age:    54 years      BP:           106/91 mmHg Patient Gender: M             HR:           77  bpm. Exam Location:  [facility] Procedure: 2D Echo, Color Doppler, Cardiac Doppler and Intracardiac            Opacification Agent Indications:     I50.31 CHF-Acute Diastolic  History:         Patient has prior history of Echocardiogram examinations. CHF,                  COPD; Risk Factors:Hypertension and Diabetes.  Sonographer:     Humphrey Rolls RDCS (AE) Referring Phys:  [id number] Brien Few NIU Diagnosing Phys: Julien Nordmann MD IMPRESSIONS  1. Left ventricular ejection fraction, by estimation, is 20 to 25%. Left ventricular ejection fraction by PLAX is 19 %. The left  ventricle has severely decreased function. The left ventrical demonstrates global hypokinesis. The left ventricular internal  cavity size was severely dilated. Left ventricular diastolic parameters are consistent with Grade I diastolic dysfunction (impaired relaxation).  2. Right ventricular systolic function is moderately reduced. The right ventricular size is mildly enlarged. There is normal pulmonary artery systolic pressure. FINDINGS  Left Ventricle: Left ventricular ejection fraction, by estimation, is 20 to 25%. Left ventricular ejection fraction by PLAX is 19 % The left ventricle has severely decreased function. The left ventricle demonstrates global hypokinesis. Definity contrast  agent was given IV to delineate the left ventricular endocardial borders. The left ventricular internal cavity size was severely dilated. There is no left ventricular hypertrophy. Left ventricular diastolic parameters are consistent with Grade I diastolic dysfunction (impaired relaxation). Right Ventricle: The right ventricular size is mildly enlarged. No increase in right ventricular wall thickness. Right ventricular systolic function is moderately reduced. There is normal pulmonary artery systolic pressure. The tricuspid regurgitant velocity is 2.06 m/s, and with an assumed right atrial pressure of 10 mmHg, the estimated right ventricular systolic pressure is 48.5 mmHg. Left  Atrium: Left atrial size was normal in size. Right Atrium: Right atrial size was normal in size. Pericardium: There is no evidence of pericardial effusion. Mitral Valve: The mitral valve is normal in structure and function. Normal mobility of the mitral valve leaflets. Mild mitral valve regurgitation. No evidence of mitral valve stenosis. MV peak gradient, 1.9 mmHg. The mean mitral valve gradient is 1.0 mmHg. Tricuspid Valve: The tricuspid valve is normal in structure. Tricuspid valve regurgitation is not demonstrated. No evidence of tricuspid stenosis. Aortic Valve: The aortic valve is normal in structure and function. Aortic valve regurgitation is not visualized. Mild to moderate aortic valve sclerosis/calcification is present, without any evidence of aortic stenosis. Aortic valve mean gradient measures 4.0 mmHg. Aortic valve peak gradient measures 7.7 mmHg. Aortic valve area, by VTI measures 2.01 cm. Pulmonic Valve: The pulmonic valve was normal in structure. Pulmonic valve regurgitation is not visualized. No evidence of pulmonic stenosis. Aorta: The aortic root is normal in size and structure. Venous: The inferior vena cava is normal in size with greater than 50% respiratory variability, suggesting right atrial pressure of 3 mmHg. The inferior vena cava and the hepatic vein show a normal flow pattern. IAS/Shunts: No atrial level shunt detected by color flow Doppler.  LEFT VENTRICLE PLAX 2D LV EF:         Left            Diastology                ventricular     LV e' lateral:   5.00 cm/s                ejection        LV E/e' lateral: 14.0                fraction by                PLAX is 19                % LVIDd:         6.14 cm LVIDs:         5.60 cm LV PW:         1.11 cm LV IVS:        0.92 cm LVOT diam:     2.20 cm LV SV:  46.00 ml LV SV Index:   15.15 LVOT Area:     3.80 cm  RIGHT VENTRICLE RV Basal diam:  4.20 cm TAPSE (M-mode): 1.6 cm LEFT ATRIUM             Index       RIGHT ATRIUM            Index LA diam:        3.80 cm 1.65 cm/m  RA Area:     24.20 cm LA Vol (A2C):   57.7 ml 25.08 ml/m RA Volume:   78.00 ml  33.90 ml/m LA Vol (A4C):   48.0 ml 20.86 ml/m LA Biplane Vol: 55.7 ml 24.21 ml/m  AORTIC VALVE                   PULMONIC VALVE AV Area (Vmax):    2.73 cm    PV Vmax:       0.86 m/s AV Area (Vmean):   2.13 cm    PV Vmean:      54.200 cm/s AV Area (VTI):     2.01 cm    PV VTI:        0.121 m AV Vmax:           139.00 cm/s PV Peak grad:  3.0 mmHg AV Vmean:          94.700 cm/s PV Mean grad:  1.0 mmHg AV VTI:            0.229 m AV Peak Grad:      7.7 mmHg AV Mean Grad:      4.0 mmHg LVOT Vmax:         99.80 cm/s LVOT Vmean:        53.100 cm/s LVOT VTI:          0.121 m LVOT/AV VTI ratio: 0.53  AORTA Ao Root diam: 3.30 cm MITRAL VALVE                        TRICUSPID VALVE MV Area (PHT): 5.81 cm             TR Peak grad:   17.0 mmHg MV Peak grad:  1.9 mmHg             TR Vmax:        206.00 cm/s MV Mean grad:  1.0 mmHg MV Vmax:       0.69 m/s             SHUNTS MV Vmean:      48.5 cm/s            Systemic VTI:  0.12 m MV Decel Time: 131 msec             Systemic Diam: 2.20 cm MV E velocity: 69.85 cm/s 103 cm/s MV A velocity: 73.00 cm/s 70.3 cm/s MV E/A ratio:  0.96       1.5 Julien Nordmann MD Electronically signed by Julien Nordmann MD Signature Date/Time: 12/04/2019/6:12:28 PM    Final    [place] EKG SITE RITE  Result Date: 12/05/2019 If Site Rite image not attached, placement could not be confirmed due to current cardiac rhythm.    Assessment and Recommendation  54 y.o. male with known chronic systolic dysfunction congestive heart failure with acute on chronic systolic dysfunction congestive heart failure with elevated BNP and pulmonary edema slightly improved with appropriate medication management including intravenous Lasix and no current evidence of myocardial  infarction.  Patient does have some wide-complex tachycardia needing further medication management after blood pressure is  improved 1.  Continue acute on chronic systolic dysfunction congestive heart failure treatment with intravenous Lasix continuing to improve hypoxia and edema slowly 2.  No further cardiac diagnostics necessary at this time with echocardiogram showing severe LV dysfunction with ejection fraction of 20% 3.  Treatment of wide-complex tachycardia cardiomyopathy with beta-blocker and increased dose as necessary for heart rate between 60 and 70 bpm at baseline and reduction of episodes of tachycardia after patient has better and more stable blood pressure after discontinuation of Levophed 4.  Continue treatment of any hypoxia or other lung disease exacerbation as per pulmonology which appears to be improving 5.  Okay for transfer to telemetry cardiac standpoint  Signed, Arnoldo Hooker M.D. FACC

## 2019-12-07 NOTE — Progress Notes (Signed)
PROGRESS NOTE    Paul Hernandez  GTX:[id number] DOB: Mar 02, 1966 DOA: 12/03/2019 PCP: Armando Gang, FNP    Brief Narrative:  HPI: Paul Hernandez is a 54 y.o. male with medical history significant of sCHF with EF 20-25%, hypertension, diabetes mellitus, COPD, schizophrenia, who presents with shortness of breath. This is direct admission from clinic, NP Victorino Dike.  Patient states that he has worsening shortness breath since yesterday, which has been progressively worsening.  He has mild dry cough. He denies fever, chills, chest pain.  Patient denies nausea, vomiting, diarrhea, abdominal pain, symptoms of UTI or unilateral weakness. Pt was seen by NP, Victorino Dike at the clinic, an was found to have bilateral leg edema and mild wheezing on auscultation, concerning from combination of COPD and CHF exacerbation.  Patient did not take his medications the past 2 days.   At clinic, oxygen saturation 99% on room air, blood pressure 152/102, heart rate 53, RR 24, temperature normal. We are consulted for direct admission from clinic.  Patient is accepted to telemetry bed as inpatient. Pending covid 19 RVP PCR. Pt is started on BiPAP  2/11: Patient was on BiPAP until early this morning.  Weaned off by respiratory therapy.  Tolerating well.  On 3 L nasal cannula this morning.  Diuresing well.  Patient did have an episode of SVT with ventricular rate up to 170.  Sustained for approximately 30 seconds then reverted back to normal sinus rhythm without intervention.  ICU consulted, discussed with ICU attending.  2/12: Patient seen and examined.  Was on BiPAP overnight again.  Remains in stepdown status.  Hypotension noted overnight requiring initiation of vasopressor therapy.  At time of my evaluation this morning the patient is on 7 mcg of Levophed.  No V. tach noted on telemetry over interval.  Patient appears in good spirits this morning.  Again requesting food and drink.  2/13: Patient seen and examined.  Again  requiring nocturnal BiPAP.  This morning maps look improved however the patient is on 10 mcg of Levophed.  No further cardiac diagnostics per cardiology consultant.  Level of care transfer to ICU for vasopressor monitoring and management.  2/14: Patient seen and examined.  Weaned off vasopressors.  Did not use BiPAP overnight.  Remains on 3 to 5 L nasal cannula.  No further cardiac diagnostics.  Can be downgraded to telemetry status.   Assessment & Plan:   Principal Problem:   Acute on chronic systolic CHF (congestive heart failure) (HCC) Active Problems:   COPD exacerbation (HCC)   Schizophrenia (HCC)   Hyperlipidemia, unspecified   Hypertension   Diabetes mellitus without complication (HCC)  Hypotension/shock Patient was started on vasopressors 2/11 Currently on Levophed, 10 mcg as of this note Central access in place Communicated with bedside RN, prioritize diuresis Can hold other blood pressure medications while on pressors to allow more room for diuresis Midodrine 10mg  q6h started per intensivist Solu-cortef 100 BID IV started Weaned off Levophed Transfer to telemetry  Acute on chronic systolic CHF (congestive heart failure) (HCC): Nonsustained wide-complex tachyarrhythmia  2D echo on 11/30/2018 showed EF of 20-25%.   Patient has elevated BNP 4350, 3+ leg edema bilaterally chest x-ray with cardiomegaly and vascular testing consistent with CHF exacerbation. Follow-up 2D echocardiogram- EF 20- 25%, consistent with prior 12/07/19: Weaned off vasopressors Plan: Transfer to telemetry Continue Lasix 60 mg twice daily Daily weights Strict I's and O's Low-sodium diet 1200 cc fluid restriction Target net -1-1.5L per day Continue Entresto when blood pressure allows Continue  Coreg when blood pressure allows Cardiology consulted, no further ischemic diagnostics indicated at this time Continue telemetry monitoring.   Once able Coreg to be restarted which will help to suppress  nonsustained breakthrough dysrhythmias  COPD exacerbation (Home): -Bronchodilators -As needed Mucinex -Solu-Cortef 100 twice daily - Anticipate transition to PO steroids starting tomorrow  Schizophrenia [facility]): -Patient is haldol injection -Benztropine -ativan prn  Hyperlipidemia, unspecified: -crestor and zetia  HTN:  -Continue home medications  Amlodipine, Coreg -hydralazine prn  Diabetes mellitus without complication ([place]) Most recent A1c 8.3 on 11/29/18, poorly controled.  Patient is taking [place], Metformin, Victoza at home Plan: All oral agents on hold Lantus 10 units daily Sliding scale coverage   DVT prophylaxis: Lovenox Code Status: Full Family Communication: None Disposition Plan: Home, anticipate 24 to 48 hours once respiratory status stabilized patient euvolemic  Consultants:   Intensivist  Procedures:   None  Antimicrobials:   None   Subjective: Patient seen and examined Currently on 4-5L nasal cannula [place]ation appears at baseline Requesting ice cream Weaned off vasopressors  Objective: Vitals:   12/07/19 0900 12/07/19 1000 12/07/19 1142 12/07/19 1444  BP: 115/80 (!) 125/94 111/74   Pulse: 66 81 70   Resp: 13 (!) 24 18   Temp: 98.6 F (37 C)  98.7 F (37.1 C)   TempSrc: Oral  Oral   SpO2: 99%   (!) 85%  Weight:      Height:        Intake/Output Summary (Last 24 hours) at 12/07/2019 1526 Last data filed at 12/07/2019 1345 Gross per 24 hour  Intake 1104.96 ml  Output 320 ml  Net 784.96 ml   Filed Weights   12/04/19 0500 12/05/19 0421 12/06/19 0500  Weight: 108.7 kg 110.7 kg 110.9 kg    Examination:  General exam: Appears calm and comfortable  Respiratory system: Decreased air movement, scattered mild wheeze, normal respiratory effort, bibasilar crackles Cardiovascular system: S1-S2, no murmurs, 3+ pitting edema bilateral, positive JVD  gastrointestinal system: Abdomen is nondistended, soft and nontender. No  organomegaly or masses felt. Normal bowel sounds heard. Central nervous system: Alert and oriented. No focal neurological deficits. Extremities: Symmetric 5 x 5 power. Skin: No rashes, lesions or ulcers Psychiatry: Judgement and insight appear normal. Mood & affect appropriate.     Data Reviewed: I have personally reviewed following labs and imaging studies  CBC: Recent Labs  Lab 12/03/19 1256 12/06/19 0033  WBC 8.4 15.6*  HGB 15.4 13.8  HCT 52.4* 47.3  MCV 89.7 89.9  PLT 308 462   Basic Metabolic Panel: Recent Labs  Lab 12/03/19 1256 12/04/19 0403 12/05/19 0449 12/06/19 0033 12/07/19 0457  NA 141 142 137 139 140  K 4.7 4.5 4.4 4.4 5.3*  CL 96* 92* 92* 96* 94*  CO2 34* 40* 37* 38* 38*  GLUCOSE 187* 200* 201* 233* 165*  BUN 17 19 29* 38* 26*  CREATININE 0.97 1.05 1.02 1.25* 0.82  CALCIUM 8.9 8.8* 7.8* 7.1* 7.7*  MG  --  2.0 1.9 1.8 2.1  PHOS  --   --  5.2*  --   --    GFR: Estimated Creatinine Clearance: 135.5 mL/min (by C-G formula based on SCr of 0.82 mg/dL). Liver Function Tests: No results for input(s): AST, ALT, ALKPHOS, BILITOT, PROT, ALBUMIN in the last 168 hours. No results for input(s): LIPASE, AMYLASE in the last 168 hours. No results for input(s): AMMONIA in the last 168 hours. Coagulation Profile: No results for input(s): INR, PROTIME in the  last 168 hours. Cardiac Enzymes: No results for input(s): CKTOTAL, CKMB, CKMBINDEX, TROPONINI in the last 168 hours. BNP (last 3 results) No results for input(s): PROBNP in the last 8760 hours. HbA1C: No results for input(s): HGBA1C in the last 72 hours. CBG: Recent Labs  Lab 12/06/19 1128 12/06/19 1556 12/06/19 2102 12/07/19 0759 12/07/19 1144  GLUCAP 316* 244* 301* 154* 253*   Lipid Profile: No results for input(s): CHOL, HDL, LDLCALC, TRIG, CHOLHDL, LDLDIRECT in the last 72 hours. Thyroid Function Tests: No results for input(s): TSH, T4TOTAL, FREET4, T3FREE, THYROIDAB in the last 72 hours. Anemia  Panel: No results for input(s): VITAMINB12, FOLATE, FERRITIN, TIBC, IRON, RETICCTPCT in the last 72 hours. Sepsis Labs: No results for input(s): PROCALCITON, LATICACIDVEN in the last 168 hours.  Recent Results (from the past 240 hour(s))  MRSA PCR Screening     Status: None   Collection Time: 12/03/19 12:24 PM   Specimen: Nasal Mucosa; Nasopharyngeal  Result Value Ref Range Status   MRSA by PCR NEGATIVE NEGATIVE Final    Comment:        The GeneXpert MRSA Assay (FDA approved for NASAL specimens only), is one component of a comprehensive MRSA colonization surveillance program. It is not intended to diagnose MRSA infection nor to guide or monitor treatment for MRSA infections. Performed at [facility], [address] [place], [place] [address]   Respiratory Panel by RT PCR (Flu A&B, Covid) - Nasopharyngeal Swab     Status: None   Collection Time: 12/03/19 12:24 PM   Specimen: Nasopharyngeal Swab  Result Value Ref Range Status   SARS Coronavirus 2 by RT PCR NEGATIVE NEGATIVE Final    Comment: (NOTE) SARS-CoV-2 target nucleic acids are NOT DETECTED. The SARS-CoV-2 RNA is generally detectable in upper respiratoy specimens during the acute phase of infection. The lowest concentration of SARS-CoV-2 viral copies this assay can detect is 131 copies/mL. A negative result does not preclude SARS-Cov-2 infection and should not be used as the sole basis for treatment or other patient management decisions. A negative result may occur with  improper specimen collection/handling, submission of specimen other than nasopharyngeal swab, presence of viral mutation(s) within the areas targeted by this assay, and inadequate number of viral copies (<131 copies/mL). A negative result must be combined with clinical observations, patient history, and epidemiological information. The expected result is Negative. Fact Sheet for Patients:  https://www.moore.com/ Fact  Sheet for Healthcare Providers:  https://www.young.biz/ This test is not yet ap proved or cleared by the [place] FDA and  has been authorized for detection and/or diagnosis of SARS-CoV-2 by FDA under an Emergency Use Authorization (EUA). This EUA will remain  in effect (meaning this test can be used) for the duration of the COVID-19 declaration under Section 564(b)(1) of the Act, 21 U.S.C. section 360bbb-3(b)(1), unless the authorization is terminated or revoked sooner.    Influenza A by PCR NEGATIVE NEGATIVE Final   Influenza B by PCR NEGATIVE NEGATIVE Final    Comment: (NOTE) The Xpert Xpress SARS-CoV-2/FLU/RSV assay is intended as an aid in  the diagnosis of influenza from Nasopharyngeal swab specimens and  should not be used as a sole basis for treatment. Nasal washings and  aspirates are unacceptable for Xpert Xpress SARS-CoV-2/FLU/RSV  testing. Fact Sheet for Patients: https://www.moore.com/ Fact Sheet for Healthcare Providers: https://www.young.biz/ This test is not yet approved or cleared by the [place] FDA and  has been authorized for detection and/or diagnosis of SARS-CoV-2 by  FDA under an  Emergency Use Authorization (EUA). This EUA will remain  in effect (meaning this test can be used) for the duration of the  Covid-19 declaration under Section 564(b)(1) of the Act, 21  U.S.C. section 360bbb-3(b)(1), unless the authorization is  terminated or revoked. Performed at [facility], [address] [place], [place] [address]          Radiology Studies: [facility] Chest [place] 1 View  Result Date: 12/06/2019 CLINICAL DATA:  Pulmonary disease. EXAM: PORTABLE CHEST 1 VIEW COMPARISON:  12/03/2019 FINDINGS: Enlargement of the cardiac silhouette is unchanged. Hazy opacity in the right lung base is unchanged and compatible with a small pleural effusion and airspace opacity. There is mildly increased hazy  opacity in the left lung base compared to the prior study as well which may also reflect in part a small pleural effusion. No pneumothorax is identified. A right PICC has been placed in terminates over the mid SVC. IMPRESSION: Unchanged right and new left basilar opacities likely reflecting small pleural effusions and atelectasis or infiltrate. Electronically Signed   By: Sebastian Ache M.D.   On: 12/06/2019 09:50        Scheduled Meds: . chlorhexidine  15 mL Mouth Rinse BID  . Chlorhexidine Gluconate Cloth  6 each Topical Daily  . enoxaparin (LOVENOX) injection  40 mg Subcutaneous Q24H  . folic acid  1 mg Oral Daily  . hydrocortisone sod succinate (SOLU-CORTEF) inj  100 mg Intravenous Q12H  . insulin aspart  0-5 Units Subcutaneous QHS  . insulin aspart  0-9 Units Subcutaneous TID WC  . insulin glargine  15 Units Subcutaneous Daily  . ipratropium-albuterol  3 mL Nebulization Q6H  . levofloxacin  500 mg Oral Q24H  . mouth rinse  15 mL Mouth Rinse q12n4p  . metoprolol tartrate  50 mg Oral BID  . midodrine  10 mg Oral 4x daily  . multivitamin with minerals  1 tablet Oral Daily  . thiamine  100 mg Oral Daily   Continuous Infusions: . sodium chloride 10 mL/hr at 12/06/19 1700  . norepinephrine (LEVOPHED) Adult infusion 2 mcg/min (12/06/19 1700)     LOS: 4 days    Time spent: 35 minutes    Tresa Moore, MD [facility] Pager [phone]  If 7PM-7AM, please contact night-coverage  12/07/2019, 3:26 PM

## 2019-12-08 ENCOUNTER — Inpatient Hospital Stay

## 2019-12-08 LAB — BASIC METABOLIC PANEL
Anion gap: 5 (ref 5–15)
BUN: 20 mg/dL (ref 6–20)
CO2: 41 mmol/L — ABNORMAL HIGH (ref 22–32)
Calcium: 7.9 mg/dL — ABNORMAL LOW (ref 8.9–10.3)
Chloride: 92 mmol/L — ABNORMAL LOW (ref 98–111)
Creatinine, Ser: 0.76 mg/dL (ref 0.61–1.24)
GFR calc Af Amer: >60 mL/min (ref >60)
GFR calc non Af Amer: >60 mL/min (ref >60)
Glucose, Bld: 169 mg/dL — ABNORMAL HIGH (ref 70–99)
Potassium: 4.1 mmol/L (ref 3.5–5.1)
Sodium: 138 mmol/L (ref 135–145)

## 2019-12-08 LAB — GLUCOSE, CAPILLARY
Glucose-Capillary: 154 mg/dL — ABNORMAL HIGH (ref 70–99)
Glucose-Capillary: 249 mg/dL — ABNORMAL HIGH (ref 70–99)
Glucose-Capillary: 215 mg/dL — ABNORMAL HIGH (ref 70–99)
Glucose-Capillary: 223 mg/dL — ABNORMAL HIGH (ref 70–99)

## 2019-12-08 LAB — BRAIN NATRIURETIC PEPTIDE: B Natriuretic Peptide: 2195 pg/mL — ABNORMAL HIGH (ref 0.0–100.0)

## 2019-12-08 MED ORDER — IPRATROPIUM-ALBUTEROL 0.5-2.5 (3) MG/3ML IN SOLN
3.0000 mL | Freq: Two times a day (BID) | RESPIRATORY_TRACT | Status: DC
  Administered 2019-12-08 – 2019-12-10 (×4): 3 mL via RESPIRATORY_TRACT
  Filled 2019-12-08 (×4): qty 3

## 2019-12-08 MED ORDER — HYDROCORTISONE NA SUCCINATE PF 100 MG IJ SOLR
50.0000 mg | Freq: Two times a day (BID) | INTRAMUSCULAR | Status: DC
  Administered 2019-12-08 – 2019-12-09 (×2): 50 mg via INTRAVENOUS
  Filled 2019-12-08 (×3): qty 1

## 2019-12-08 MED ORDER — MIDODRINE HCL 5 MG PO TABS
10.0000 mg | ORAL_TABLET | Freq: Two times a day (BID) | ORAL | Status: DC
  Administered 2019-12-08: 10 mg via ORAL
  Filled 2019-12-08 (×2): qty 2

## 2019-12-08 MED ORDER — SODIUM CHLORIDE 0.9% FLUSH
10.0000 mL | Freq: Two times a day (BID) | INTRAVENOUS | Status: DC
  Administered 2019-12-08: 06:00:00 20 mL
  Administered 2019-12-08: 10:00:00 10 mL
  Administered 2019-12-08: 20 mL
  Administered 2019-12-09 (×2): 10 mL

## 2019-12-08 MED ORDER — FUROSEMIDE 10 MG/ML IJ SOLN
60.0000 mg | Freq: Two times a day (BID) | INTRAMUSCULAR | Status: DC
  Administered 2019-12-08 – 2019-12-09 (×2): 60 mg via INTRAVENOUS
  Filled 2019-12-08 (×2): qty 6

## 2019-12-08 MED ORDER — SODIUM CHLORIDE 0.9% FLUSH: 10.0000 mL | INTRAVENOUS | Status: DC | PRN

## 2019-12-08 NOTE — Progress Notes (Signed)
[facility] Cardiology [facility] Encounter Note  Patient: Paul Hernandez / Admit Date: 12/03/2019 / Date of Encounter: 12/08/2019, 7:34 AM   Subjective: Patient feels relatively well today no evidence of significant new symptoms of chest pain or shortness of breath.  Patient overall rested comfortably overnight patient's slowly improving from COPD and lower extremity edema and acute on chronic systolic dysfunction heart failure with Lasix and oxygen supplementation.  Patient did have multiple runs of wide-complex tachycardia 2 days prior but it appears that this is less frequent in the last 24 hours than before and currently in normal sinus rhythm at 76 bpm today and this is after addition of beta-blocker.  This may be secondary to cardiomyopathy requiring further medication management.  Patient did not have any symptoms suggesting syncope dizziness Echocardiogram reestablished patient has a severe LV systolic dysfunction with ejection fraction of 20% Review of Systems: Positive for: None Negative for: Vision change, hearing change, syncope, dizziness, nausea, vomiting,diarrhea, bloody stool, stomach pain, cough, congestion, diaphoresis, urinary frequency, urinary pain,skin lesions, skin rashes Others previously listed  Objective: Telemetry: Normal sinus rhythm Physical Exam: Blood pressure (!) 138/101, pulse 76, temperature 98.2 F (36.8 C), temperature source Oral, resp. rate 18, height 6' (1.829 m), weight 105.3 kg, SpO2 97 %. Body mass index is 31.49 kg/m. General: Well developed, well nourished, in no acute distress. Head: Normocephalic, atraumatic, sclera non-icteric, no xanthomas, nares are without discharge. Neck: No apparent masses Lungs: Normal respirations with few wheezes, no rhonchi, no rales , basilar crackles   Heart: Regular rate and rhythm, normal S1 S2, no murmur, no rub, no gallop, PMI is normal size and placement, carotid upstroke normal without bruit, jugular venous  pressure normal Abdomen: Soft, non-tender, non-distended with normoactive bowel sounds. No hepatosplenomegaly. Abdominal aorta is normal size without bruit Extremities: 1-2+ edema, no clubbing, no cyanosis, no ulcers,  Peripheral: 2+ radial, 2+ femoral, 2+ dorsal pedal pulses Neuro: Alert and oriented. Moves all extremities spontaneously. Psych:  Responds to questions appropriately with a normal affect.   Intake/Output Summary (Last 24 hours) at 12/08/2019 0734 Last data filed at 12/07/2019 2125 Gross per 24 hour  Intake 840 ml  Output 475 ml  Net 365 ml    Inpatient Medications:  . chlorhexidine  15 mL Mouth Rinse BID  . Chlorhexidine Gluconate Cloth  6 each Topical Daily  . enoxaparin (LOVENOX) injection  40 mg Subcutaneous Q24H  . folic acid  1 mg Oral Daily  . hydrocortisone sod succinate (SOLU-CORTEF) inj  100 mg Intravenous Q12H  . insulin aspart  0-5 Units Subcutaneous QHS  . insulin aspart  0-9 Units Subcutaneous TID WC  . insulin glargine  15 Units Subcutaneous Daily  . ipratropium-albuterol  3 mL Nebulization Q6H  . levofloxacin  500 mg Oral Q24H  . mouth rinse  15 mL Mouth Rinse q12n4p  . metoprolol tartrate  50 mg Oral BID  . midodrine  10 mg Oral 4x daily  . multivitamin with minerals  1 tablet Oral Daily  . sodium chloride flush  10-40 mL Intracatheter Q12H  . sodium chloride flush  3 mL Intravenous Q12H  . thiamine  100 mg Oral Daily   Infusions:  . sodium chloride 10 mL/hr at 12/06/19 1700    Labs: Recent Labs    12/06/19 0033 12/06/19 0033 12/07/19 0457 12/08/19 0600  NA 139   < > 140 138  K 4.4   < > 5.3* 4.1  CL 96*   < > 94* 92*  CO2 38*   < > 38* 41*  GLUCOSE 233*   < > 165* 169*  BUN 38*   < > 26* 20  CREATININE 1.25*   < > 0.82 0.76  CALCIUM 7.1*   < > 7.7* 7.9*  MG 1.8  --  2.1  --    < > = values in this interval not displayed.   No results for input(s): AST, ALT, ALKPHOS, BILITOT, PROT, ALBUMIN in the last 72 hours. Recent Labs     12/06/19 0033  WBC 15.6*  HGB 13.8  HCT 47.3  MCV 89.9  PLT 263   No results for input(s): CKTOTAL, CKMB, TROPONINI in the last 72 hours. Invalid input(s): POCBNP No results for input(s): HGBA1C in the last 72 hours.   Weights: Filed Weights   12/05/19 0421 12/06/19 0500 12/08/19 0519  Weight: 110.7 kg 110.9 kg 105.3 kg     Radiology/Studies:  DG Chest Port 1 View  Result Date: 12/06/2019 CLINICAL DATA:  Pulmonary disease. EXAM: PORTABLE CHEST 1 VIEW COMPARISON:  12/03/2019 FINDINGS: Enlargement of the cardiac silhouette is unchanged. Hazy opacity in the right lung base is unchanged and compatible with a small pleural effusion and airspace opacity. There is mildly increased hazy opacity in the left lung base compared to the prior study as well which may also reflect in part a small pleural effusion. No pneumothorax is identified. A right PICC has been placed in terminates over the mid SVC. IMPRESSION: Unchanged right and new left basilar opacities likely reflecting small pleural effusions and atelectasis or infiltrate. Electronically Signed   By: Sebastian Ache M.D.   On: 12/06/2019 09:50   DG Chest Port 1 View  Result Date: 12/03/2019 CLINICAL DATA:  Shortness of breath EXAM: PORTABLE CHEST 1 VIEW COMPARISON:  07/08/2019 FINDINGS: Cardiomegaly with vascular congestion. Small right pleural effusion. Right basilar atelectasis or infiltrate. Left lung clear. No edema. No acute bony abnormality. IMPRESSION: Cardiomegaly with vascular congestion. Small right effusion with right base atelectasis or infiltrate. Electronically Signed   By: Charlett Nose M.D.   On: 12/03/2019 15:06   ECHOCARDIOGRAM COMPLETE  Result Date: 12/04/2019    ECHOCARDIOGRAM REPORT   Patient Name:   Paul Hernandez Date of Exam: 12/04/2019 Medical Rec #:  [id number]     Height:       72.0 in Accession #:    [id number]    Weight:       239.6 lb Date of Birth:  1966-03-05    BSA:          2.30 m Patient Age:    54 years       BP:           106/91 mmHg Patient Gender: M             HR:           77 bpm. Exam Location:  [facility] Procedure: 2D Echo, Color Doppler, Cardiac Doppler and Intracardiac            Opacification Agent Indications:     I50.31 CHF-Acute Diastolic  History:         Patient has prior history of Echocardiogram examinations. CHF,                  COPD; Risk Factors:Hypertension and Diabetes.  Sonographer:     Humphrey Rolls RDCS (AE) Referring Phys:  [id number] Brien Few NIU Diagnosing Phys: Julien Nordmann MD IMPRESSIONS  1. Left ventricular ejection fraction, by estimation, is 20  to 25%. Left ventricular ejection fraction by PLAX is 19 %. The left ventricle has severely decreased function. The left ventrical demonstrates global hypokinesis. The left ventricular internal  cavity size was severely dilated. Left ventricular diastolic parameters are consistent with Grade I diastolic dysfunction (impaired relaxation).  2. Right ventricular systolic function is moderately reduced. The right ventricular size is mildly enlarged. There is normal pulmonary artery systolic pressure. FINDINGS  Left Ventricle: Left ventricular ejection fraction, by estimation, is 20 to 25%. Left ventricular ejection fraction by PLAX is 19 % The left ventricle has severely decreased function. The left ventricle demonstrates global hypokinesis. Definity contrast  agent was given IV to delineate the left ventricular endocardial borders. The left ventricular internal cavity size was severely dilated. There is no left ventricular hypertrophy. Left ventricular diastolic parameters are consistent with Grade I diastolic dysfunction (impaired relaxation). Right Ventricle: The right ventricular size is mildly enlarged. No increase in right ventricular wall thickness. Right ventricular systolic function is moderately reduced. There is normal pulmonary artery systolic pressure. The tricuspid regurgitant velocity is 2.06 m/s, and with an assumed right atrial pressure of 10 mmHg,  the estimated right ventricular systolic pressure is 41.3 mmHg. Left Atrium: Left atrial size was normal in size. Right Atrium: Right atrial size was normal in size. Pericardium: There is no evidence of pericardial effusion. Mitral Valve: The mitral valve is normal in structure and function. Normal mobility of the mitral valve leaflets. Mild mitral valve regurgitation. No evidence of mitral valve stenosis. MV peak gradient, 1.9 mmHg. The mean mitral valve gradient is 1.0 mmHg. Tricuspid Valve: The tricuspid valve is normal in structure. Tricuspid valve regurgitation is not demonstrated. No evidence of tricuspid stenosis. Aortic Valve: The aortic valve is normal in structure and function. Aortic valve regurgitation is not visualized. Mild to moderate aortic valve sclerosis/calcification is present, without any evidence of aortic stenosis. Aortic valve mean gradient measures 4.0 mmHg. Aortic valve peak gradient measures 7.7 mmHg. Aortic valve area, by VTI measures 2.01 cm. Pulmonic Valve: The pulmonic valve was normal in structure. Pulmonic valve regurgitation is not visualized. No evidence of pulmonic stenosis. Aorta: The aortic root is normal in size and structure. Venous: The inferior vena cava is normal in size with greater than 50% respiratory variability, suggesting right atrial pressure of 3 mmHg. The inferior vena cava and the hepatic vein show a normal flow pattern. IAS/Shunts: No atrial level shunt detected by color flow Doppler.  LEFT VENTRICLE PLAX 2D LV EF:         Left            Diastology                ventricular     LV e' lateral:   5.00 cm/s                ejection        LV E/e' lateral: 14.0                fraction by                PLAX is 19                % LVIDd:         6.14 cm LVIDs:         5.60 cm LV PW:         1.11 cm LV IVS:        0.92 cm LVOT diam:  2.20 cm LV SV:         46.00 ml LV SV Index:   15.15 LVOT Area:     3.80 cm  RIGHT VENTRICLE RV Basal diam:  4.20 cm TAPSE  (M-mode): 1.6 cm LEFT ATRIUM             Index       RIGHT ATRIUM           Index LA diam:        3.80 cm 1.65 cm/m  RA Area:     24.20 cm LA Vol (A2C):   57.7 ml 25.08 ml/m RA Volume:   78.00 ml  33.90 ml/m LA Vol (A4C):   48.0 ml 20.86 ml/m LA Biplane Vol: 55.7 ml 24.21 ml/m  AORTIC VALVE                   PULMONIC VALVE AV Area (Vmax):    2.73 cm    PV Vmax:       0.86 m/s AV Area (Vmean):   2.13 cm    PV Vmean:      54.200 cm/s AV Area (VTI):     2.01 cm    PV VTI:        0.121 m AV Vmax:           139.00 cm/s PV Peak grad:  3.0 mmHg AV Vmean:          94.700 cm/s PV Mean grad:  1.0 mmHg AV VTI:            0.229 m AV Peak Grad:      7.7 mmHg AV Mean Grad:      4.0 mmHg LVOT Vmax:         99.80 cm/s LVOT Vmean:        53.100 cm/s LVOT VTI:          0.121 m LVOT/AV VTI ratio: 0.53  AORTA Ao Root diam: 3.30 cm MITRAL VALVE                        TRICUSPID VALVE MV Area (PHT): 5.81 cm             TR Peak grad:   17.0 mmHg MV Peak grad:  1.9 mmHg             TR Vmax:        206.00 cm/s MV Mean grad:  1.0 mmHg MV Vmax:       0.69 m/s             SHUNTS MV Vmean:      48.5 cm/s            Systemic VTI:  0.12 m MV Decel Time: 131 msec             Systemic Diam: 2.20 cm MV E velocity: 69.85 cm/s 103 cm/s MV A velocity: 73.00 cm/s 70.3 cm/s MV E/A ratio:  0.96       1.5 Julien Nordmann MD Electronically signed by Julien Nordmann MD Signature Date/Time: 12/04/2019/6:12:28 PM    Final    [place] EKG SITE RITE  Result Date: 12/05/2019 If Site Rite image not attached, placement could not be confirmed due to current cardiac rhythm.    Assessment and Recommendation  54 y.o. male with known chronic systolic dysfunction congestive heart failure with acute on chronic systolic dysfunction congestive heart failure with elevated BNP and pulmonary edema slightly improved with  appropriate medication management including intravenous Lasix and no current evidence of myocardial infarction.  Patient does have some wide-complex  tachycardia needing further medication management after blood pressure is improved 1.  Continue acute on chronic systolic dysfunction congestive heart failure treatment with intravenous Lasix continuing to improve hypoxia and edema slowly 2.  No further cardiac diagnostics necessary at this time with echocardiogram showing severe LV dysfunction with ejection fraction of 20% 3.  Treatment of wide-complex tachycardia cardiomyopathy with beta-blocker and increased dose as necessary for heart rate between 60 and 70 bpm at baseline and reduction of episodes of tachycardia after patient has better and more stable blood pressure which appears to be accomplished at this time  4.  Continue treatment of any hypoxia or other lung disease exacerbation as per pulmonology which appears to be improving 5.  Reinstatement of Entresto or other antihypertensives as necessary for cardiomyopathy 6.  Begin ambulation and improvements for possible discharge to home  Signed, Arnoldo Hooker M.D. FACC

## 2019-12-08 NOTE — Progress Notes (Signed)
Pt had 30 beat run of Vtach at 1535. Pt was asymptomatic. Dr. Georgeann Oppenheim notified and no new orders at this time. Will continue to monitor.

## 2019-12-08 NOTE — TOC Initial Note (Signed)
Transition of Care [facility]) - Initial/Assessment Note    Patient Details  Name: Paul Hernandez MRN: [id number] Date of Birth: 09/07/1966  [facility] CM/SW Contact:    Shawn Route, RN Phone Number: 12/08/2019, 9:23 AM  Clinical Narrative:                  Verified Patient is resident at [facility].  Patient is current patient with [facility] and will continue these services at discharge.  Fayrene Fearing (transport at facility) verified they will need an hour notice before discharge.  Verified patient is currently on home oxygen and this is already in place. Will continue to follow patient for needs.   Expected Discharge Plan: Home w Hospice Care Barriers to Discharge: Continued Medical Work up   Patient Goals and CMS Choice        Expected Discharge Plan and Services Expected Discharge Plan: Home w Hospice Care   Discharge Planning Services: CM Consult Post Acute Care Choice: Hospice Living arrangements for the past 2 months: Group Home                             HH Agency: [facility](H[facility]) Date [facility] Agency Contacted: 12/08/19 Time HH Agency Contacted: 0914    Prior Living Arrangements/Services Living arrangements for the past 2 months: Group Home Lives with:: Facility Resident Patient language and need for interpreter reviewed:: Yes Do you feel safe going back to the place where you live?: Yes      Need for Family Participation in Patient Care: No (Comment) Care giver support system in place?: Yes (comment) Current home services: DME, Hospice((oxygen) ([facility])) Criminal Activity/Legal Involvement Pertinent to Current Situation/Hospitalization: No - Comment as needed  Activities of Daily Living Home Assistive Devices/Equipment: Cane (specify quad or straight), Walker (specify type) ADL Screening (condition at time of admission) Patient's cognitive ability adequate to safely complete daily activities?: Yes Is  the patient deaf or have difficulty hearing?: No Does the patient have difficulty seeing, even when wearing glasses/contacts?: No Does the patient have difficulty concentrating, remembering, or making decisions?: No Patient able to express need for assistance with ADLs?: Yes Does the patient have difficulty dressing or bathing?: No Independently performs ADLs?: Yes (appropriate for developmental age) Does the patient have difficulty walking or climbing stairs?: No Weakness of Legs: None Weakness of Arms/Hands: None  Permission Sought/Granted                  Emotional Assessment Appearance:: Appears stated age     Orientation: : Oriented to Self, Oriented to Situation, Oriented to Place Alcohol / Substance Use: Not Applicable Psych Involvement: No (comment)  Admission diagnosis:  Acute on chronic systolic CHF (congestive heart failure) (HCC) [I50.23] Patient Active Problem List   Diagnosis Date Noted  . Acute on chronic systolic CHF (congestive heart failure) (HCC) 12/03/2019  . Diabetes mellitus without complication (HCC)   . COPD with acute exacerbation (HCC) 12/12/2018  . Acute respiratory failure with hypoxia (HCC) 11/29/2018  . Influenza A 11/29/2018  . Hyperlipidemia, unspecified 11/29/2017  . Hypertension 11/29/2017  . Schizophrenia (HCC) 11/16/2017  . COPD (chronic obstructive pulmonary disease) (HCC) 11/15/2017  . Mental health disorder 11/15/2017  . Cerebral ischemia 11/15/2017  . Cerebral infarct (HCC) 11/15/2017  . Benign essential HTN 11/13/2017  . Diabetes type 2, controlled (HCC) 11/13/2017  . COPD exacerbation (HCC) 09/16/2017   PCP:  Franco Nones  P, FNP Pharmacy:   [facility], [address] [place] [address] Phone: [phone] Fax: [phone]     Social Determinants of Health (SDOH) Interventions    Readmission Risk Interventions No flowsheet data found.

## 2019-12-08 NOTE — Progress Notes (Signed)
PROGRESS NOTE    Paul Hernandez  ZOX:[id number] DOB: 12/08/1965 DOA: 12/03/2019 PCP: Armando Gang, FNP    Brief Narrative:  HPI: Paul Hernandez is a 54 y.o. male with medical history significant of sCHF with EF 20-25%, hypertension, diabetes mellitus, COPD, schizophrenia, who presents with shortness of breath. This is direct admission from clinic, NP Victorino Dike.  Patient states that he has worsening shortness breath since yesterday, which has been progressively worsening.  He has mild dry cough. He denies fever, chills, chest pain.  Patient denies nausea, vomiting, diarrhea, abdominal pain, symptoms of UTI or unilateral weakness. Pt was seen by NP, Victorino Dike at the clinic, an was found to have bilateral leg edema and mild wheezing on auscultation, concerning from combination of COPD and CHF exacerbation.  Patient did not take his medications the past 2 days.   At clinic, oxygen saturation 99% on room air, blood pressure 152/102, heart rate 53, RR 24, temperature normal. We are consulted for direct admission from clinic.  Patient is accepted to telemetry bed as inpatient. Pending covid 19 RVP PCR. Pt is started on BiPAP  2/11: Patient was on BiPAP until early this morning.  Weaned off by respiratory therapy.  Tolerating well.  On 3 L nasal cannula this morning.  Diuresing well.  Patient did have an episode of SVT with ventricular rate up to 170.  Sustained for approximately 30 seconds then reverted back to normal sinus rhythm without intervention.  ICU consulted, discussed with ICU attending.  2/12: Patient seen and examined.  Was on BiPAP overnight again.  Remains in stepdown status.  Hypotension noted overnight requiring initiation of vasopressor therapy.  At time of my evaluation this morning the patient is on 7 mcg of Levophed.  No V. tach noted on telemetry over interval.  Patient appears in good spirits this morning.  Again requesting food and drink.  2/13: Patient seen and examined.  Again  requiring nocturnal BiPAP.  This morning maps look improved however the patient is on 10 mcg of Levophed.  No further cardiac diagnostics per cardiology consultant.  Level of care transfer to ICU for vasopressor monitoring and management.  2/14: Patient seen and examined.  Weaned off vasopressors.  Did not use BiPAP overnight.  Remains on 3 to 5 L nasal cannula.  No further cardiac diagnostics.  Can be downgraded to telemetry status.  2/15: Patient seen and examined.  Used BiPAP intermittently overnight.  Appear to be short of breath this morning.  Remains only on 2 L nasal cannula.  This is improved.  No further cardiac diagnostics recommended.  Noted that diuretics were discontinued prior to patient leaving the ICU.  Will attempt to diuresis.   Assessment & Plan:   Principal Problem:   Acute on chronic systolic CHF (congestive heart failure) (HCC) Active Problems:   COPD exacerbation (HCC)   Schizophrenia (HCC)   Hyperlipidemia, unspecified   Hypertension   Diabetes mellitus without complication (HCC)  Hypotension/shock Patient was started on vasopressors 2/11 Weaned off vasopressors and transferred to MedSurg status Started on Solu-Cortef and midodrine by intensivist Blood pressure improved over interval Plan: Titrate down midodrine 10 mg twice daily Titrate down Solu-Cortef 50 mg twice daily Plan to titrate down or discontinue tomorrow   Acute on chronic systolic CHF (congestive heart failure) (HCC): Nonsustained wide-complex tachyarrhythmia  2D echo on 11/30/2018 showed EF of 20-25%.   Patient has elevated BNP 4350, 3+ leg edema bilaterally chest x-ray with cardiomegaly and vascular testing consistent with CHF exacerbation.  Follow-up 2D echocardiogram- EF 20- 25%, consistent with prior 12/07/19: Weaned off vasopressors Plan: Continue Lasix 60 mg twice daily Daily weights Strict I's and O's Low-sodium diet 1200 cc fluid restriction Target net -1-1.5L per day Continue  Entresto when blood pressure allows Metoprolol tartrate 50 mg twice daily started by cardiology  COPD exacerbation ([place]): -Bronchodilators -As needed Mucinex -Solu-Cortef 50 twice daily - Anticipate transition to PO steroids starting tomorrow  Schizophrenia [facility]): -Patient is haldol injection -Benztropine -ativan prn  Hyperlipidemia, unspecified: -crestor and zetia  HTN:  -Continue home medications  Amlodipine, Coreg -hydralazine prn  Diabetes mellitus without complication ([place]) Most recent A1c 8.3 on 11/29/18, poorly controled.  Patient is taking [place], Metformin, Victoza at home Plan: All oral agents on hold Lantus 10 units daily Sliding scale coverage   DVT prophylaxis: Lovenox Code Status: Full Family Communication: None Disposition Plan: Home, anticipate 24 to 48 hours once respiratory status stabilized and euvolemia achieved.  Patient will dispo back to previous living situation  Consultants:   Intensivist  Cardiology  Procedures:   PICC line placement  Antimicrobials:   None   Subjective: Patient seen and examined Currently on 2 L nasal cannula Appears short of breath  Objective: Vitals:   12/08/19 1136 12/08/19 1352 12/08/19 1511 12/08/19 1547  BP: (!) 107/94  121/87   Pulse: 65  69 76  Resp: 17  17   Temp: (!) 97.4 F (36.3 C)  98.6 F (37 C)   TempSrc: Oral  Oral   SpO2: 92% 93% 96%   Weight:      Height:        Intake/Output Summary (Last 24 hours) at 12/08/2019 1701 Last data filed at 12/08/2019 1345 Gross per 24 hour  Intake 613 ml  Output 1475 ml  Net -862 ml   Filed Weights   12/05/19 0421 12/06/19 0500 12/08/19 0519  Weight: 110.7 kg 110.9 kg 105.3 kg    Examination:  General exam: Appears calm and comfortable  Respiratory system: Decreased air movement, scattered mild wheeze, normal respiratory effort, bibasilar crackles Cardiovascular system: S1-S2, no murmurs, 3+ pitting edema bilateral, positive JVD   gastrointestinal system: Abdomen is nondistended, soft and nontender. No organomegaly or masses felt. Normal bowel sounds heard. Central nervous system: Alert and oriented. No focal neurological deficits. Extremities: Symmetric 5 x 5 power. Skin: No rashes, lesions or ulcers Psychiatry: Judgement and insight appear normal. Mood & affect appropriate.     Data Reviewed: I have personally reviewed following labs and imaging studies  CBC: Recent Labs  Lab 12/03/19 1256 12/06/19 0033  WBC 8.4 15.6*  HGB 15.4 13.8  HCT 52.4* 47.3  MCV 89.7 89.9  PLT 308 536   Basic Metabolic Panel: Recent Labs  Lab 12/04/19 0403 12/05/19 0449 12/06/19 0033 12/07/19 0457 12/08/19 0600  NA 142 137 139 140 138  K 4.5 4.4 4.4 5.3* 4.1  CL 92* 92* 96* 94* 92*  CO2 40* 37* 38* 38* 41*  GLUCOSE 200* 201* 233* 165* 169*  BUN 19 29* 38* 26* 20  CREATININE 1.05 1.02 1.25* 0.82 0.76  CALCIUM 8.8* 7.8* 7.1* 7.7* 7.9*  MG 2.0 1.9 1.8 2.1  --   PHOS  --  5.2*  --   --   --    GFR: Estimated Creatinine Clearance: 135.5 mL/min (by C-G formula based on SCr of 0.76 mg/dL). Liver Function Tests: No results for input(s): AST, ALT, ALKPHOS, BILITOT, PROT, ALBUMIN in the last 168 hours. No results for input(s): LIPASE,  AMYLASE in the last 168 hours. No results for input(s): AMMONIA in the last 168 hours. Coagulation Profile: No results for input(s): INR, PROTIME in the last 168 hours. Cardiac Enzymes: No results for input(s): CKTOTAL, CKMB, CKMBINDEX, TROPONINI in the last 168 hours. BNP (last 3 results) No results for input(s): PROBNP in the last 8760 hours. HbA1C: No results for input(s): HGBA1C in the last 72 hours. CBG: Recent Labs  Lab 12/07/19 1643 12/07/19 2052 12/08/19 0733 12/08/19 1137 12/08/19 1608  GLUCAP 197* 224* 154* 249* 215*   Lipid Profile: No results for input(s): CHOL, HDL, LDLCALC, TRIG, CHOLHDL, LDLDIRECT in the last 72 hours. Thyroid Function Tests: No results for  input(s): TSH, T4TOTAL, FREET4, T3FREE, THYROIDAB in the last 72 hours. Anemia Panel: No results for input(s): VITAMINB12, FOLATE, FERRITIN, TIBC, IRON, RETICCTPCT in the last 72 hours. Sepsis Labs: No results for input(s): PROCALCITON, LATICACIDVEN in the last 168 hours.  Recent Results (from the past 240 hour(s))  MRSA PCR Screening     Status: None   Collection Time: 12/03/19 12:24 PM   Specimen: Nasal Mucosa; Nasopharyngeal  Result Value Ref Range Status   MRSA by PCR NEGATIVE NEGATIVE Final    Comment:        The GeneXpert MRSA Assay (FDA approved for NASAL specimens only), is one component of a comprehensive MRSA colonization surveillance program. It is not intended to diagnose MRSA infection nor to guide or monitor treatment for MRSA infections. Performed at [facility], [address] [place], [place] [address]   Respiratory Panel by RT PCR (Flu A&B, Covid) - Nasopharyngeal Swab     Status: None   Collection Time: 12/03/19 12:24 PM   Specimen: Nasopharyngeal Swab  Result Value Ref Range Status   SARS Coronavirus 2 by RT PCR NEGATIVE NEGATIVE Final    Comment: (NOTE) SARS-CoV-2 target nucleic acids are NOT DETECTED. The SARS-CoV-2 RNA is generally detectable in upper respiratoy specimens during the acute phase of infection. The lowest concentration of SARS-CoV-2 viral copies this assay can detect is 131 copies/mL. A negative result does not preclude SARS-Cov-2 infection and should not be used as the sole basis for treatment or other patient management decisions. A negative result may occur with  improper specimen collection/handling, submission of specimen other than nasopharyngeal swab, presence of viral mutation(s) within the areas targeted by this assay, and inadequate number of viral copies (<131 copies/mL). A negative result must be combined with clinical observations, patient history, and epidemiological information. The expected result is Negative.  Fact Sheet for Patients:  https://www.moore.com/ Fact Sheet for Healthcare Providers:  https://www.young.biz/ This test is not yet ap proved or cleared by the [place] FDA and  has been authorized for detection and/or diagnosis of SARS-CoV-2 by FDA under an Emergency Use Authorization (EUA). This EUA will remain  in effect (meaning this test can be used) for the duration of the COVID-19 declaration under Section 564(b)(1) of the Act, 21 U.S.C. section 360bbb-3(b)(1), unless the authorization is terminated or revoked sooner.    Influenza A by PCR NEGATIVE NEGATIVE Final   Influenza B by PCR NEGATIVE NEGATIVE Final    Comment: (NOTE) The Xpert Xpress SARS-CoV-2/FLU/RSV assay is intended as an aid in  the diagnosis of influenza from Nasopharyngeal swab specimens and  should not be used as a sole basis for treatment. Nasal washings and  aspirates are unacceptable for Xpert Xpress SARS-CoV-2/FLU/RSV  testing. Fact Sheet for Patients: https://www.moore.com/ Fact Sheet for Healthcare Providers: https://www.young.biz/ This test is  not yet approved or cleared by the [place] and  has been authorized for detection and/or diagnosis of SARS-CoV-2 by  FDA under an Emergency Use Authorization (EUA). This EUA will remain  in effect (meaning this test can be used) for the duration of the  Covid-19 declaration under Section 564(b)(1) of the Act, 21  U.S.C. section 360bbb-3(b)(1), unless the authorization is  terminated or revoked. Performed at [facility], [address], [place], [place] [address]          Radiology Studies: DG Chest [place] 1 View  Result Date: 12/08/2019 CLINICAL DATA:  Hypoxia, CHF, COPD EXAM: PORTABLE CHEST 1 VIEW COMPARISON:  12/06/2019 FINDINGS: The heart size and mediastinal contours are stable. Persistent hazy bibasilar opacities, unchanged. Probable trace bilateral  pleural effusions. No pneumothorax. Right-sided PICC line terminates at the level of the distal SVC. IMPRESSION: Stable hazy bibasilar opacities. Electronically Signed   By: Duanne Guess D.O.   On: 12/08/2019 10:07        Scheduled Meds: . chlorhexidine  15 mL Mouth Rinse BID  . Chlorhexidine Gluconate Cloth  6 each Topical Daily  . enoxaparin (LOVENOX) injection  40 mg Subcutaneous Q24H  . folic acid  1 mg Oral Daily  . furosemide  60 mg Intravenous Q12H  . hydrocortisone sod succinate (SOLU-CORTEF) inj  50 mg Intravenous Q12H  . insulin aspart  0-5 Units Subcutaneous QHS  . insulin aspart  0-9 Units Subcutaneous TID WC  . insulin glargine  15 Units Subcutaneous Daily  . ipratropium-albuterol  3 mL Nebulization Q6H  . levofloxacin  500 mg Oral Q24H  . mouth rinse  15 mL Mouth Rinse q12n4p  . metoprolol tartrate  50 mg Oral BID  . midodrine  10 mg Oral BID WC  . multivitamin with minerals  1 tablet Oral Daily  . sodium chloride flush  10-40 mL Intracatheter Q12H  . sodium chloride flush  3 mL Intravenous Q12H  . thiamine  100 mg Oral Daily   Continuous Infusions: . sodium chloride 10 mL/hr at 12/06/19 1700     LOS: 5 days    Time spent: 35 minutes    Tresa Moore, MD [facility] Pager [phone]  If 7PM-7AM, please contact night-coverage  12/08/2019, 5:01 PM

## 2019-12-09 LAB — GLUCOSE, CAPILLARY
Glucose-Capillary: 160 mg/dL — ABNORMAL HIGH (ref 70–99)
Glucose-Capillary: 167 mg/dL — ABNORMAL HIGH (ref 70–99)
Glucose-Capillary: 140 mg/dL — ABNORMAL HIGH (ref 70–99)
Glucose-Capillary: 157 mg/dL — ABNORMAL HIGH (ref 70–99)

## 2019-12-09 MED ORDER — AMIODARONE HCL 200 MG PO TABS
200.0000 mg | ORAL_TABLET | Freq: Every day | ORAL | Status: DC
  Administered 2019-12-09 – 2019-12-10 (×2): 200 mg via ORAL
  Filled 2019-12-09 (×2): qty 1

## 2019-12-09 MED ORDER — PREDNISONE 50 MG PO TABS
50.0000 mg | ORAL_TABLET | Freq: Every day | ORAL | Status: DC
  Administered 2019-12-10: 50 mg via ORAL
  Filled 2019-12-09: qty 1

## 2019-12-09 MED ORDER — FUROSEMIDE 10 MG/ML IJ SOLN
40.0000 mg | Freq: Two times a day (BID) | INTRAMUSCULAR | Status: DC
  Administered 2019-12-09 – 2019-12-10 (×3): 40 mg via INTRAVENOUS
  Filled 2019-12-09 (×3): qty 4

## 2019-12-09 NOTE — Progress Notes (Signed)
PROGRESS NOTE    Paul Hernandez  YBO:[id number] DOB: 01-11-1966 DOA: 12/03/2019 PCP: Armando Gang, FNP    Brief Narrative:  HPI: Paul Hernandez is a 54 y.o. male with medical history significant of sCHF with EF 20-25%, hypertension, diabetes mellitus, COPD, schizophrenia, who presents with shortness of breath. This is direct admission from clinic, NP Victorino Dike.  Patient states that he has worsening shortness breath since yesterday, which has been progressively worsening.  He has mild dry cough. He denies fever, chills, chest pain.  Patient denies nausea, vomiting, diarrhea, abdominal pain, symptoms of UTI or unilateral weakness. Pt was seen by NP, Victorino Dike at the clinic, an was found to have bilateral leg edema and mild wheezing on auscultation, concerning from combination of COPD and CHF exacerbation.  Patient did not take his medications the past 2 days.   At clinic, oxygen saturation 99% on room air, blood pressure 152/102, heart rate 53, RR 24, temperature normal. We are consulted for direct admission from clinic.  Patient is accepted to telemetry bed as inpatient. Pending covid 19 RVP PCR. Pt is started on BiPAP  2/11: Patient was on BiPAP until early this morning.  Weaned off by respiratory therapy.  Tolerating well.  On 3 L nasal cannula this morning.  Diuresing well.  Patient did have an episode of SVT with ventricular rate up to 170.  Sustained for approximately 30 seconds then reverted back to normal sinus rhythm without intervention.  ICU consulted, discussed with ICU attending.  2/12: Patient seen and examined.  Was on BiPAP overnight again.  Remains in stepdown status.  Hypotension noted overnight requiring initiation of vasopressor therapy.  At time of my evaluation this morning the patient is on 7 mcg of Levophed.  No V. tach noted on telemetry over interval.  Patient appears in good spirits this morning.  Again requesting food and drink.  2/13: Patient seen and examined.  Again  requiring nocturnal BiPAP.  This morning maps look improved however the patient is on 10 mcg of Levophed.  No further cardiac diagnostics per cardiology consultant.  Level of care transfer to ICU for vasopressor monitoring and management.  2/14: Patient seen and examined.  Weaned off vasopressors.  Did not use BiPAP overnight.  Remains on 3 to 5 L nasal cannula.  No further cardiac diagnostics.  Can be downgraded to telemetry status.  2/15: Patient seen and examined.  Used BiPAP intermittently overnight.  Appear to be short of breath this morning.  Remains only on 2 L nasal cannula.  This is improved.  No further cardiac diagnostics recommended.  Noted that diuretics were discontinued prior to patient leaving the ICU.  Will attempt to diuresis.  2/16: Patient seen and examined.  Weaned down to 2 L nasal cannula.  Appears less short of breath.  Attempting to optimize medical therapy in preparation for discharge.   Assessment & Plan:   Principal Problem:   Acute on chronic systolic CHF (congestive heart failure) (HCC) Active Problems:   COPD exacerbation (HCC)   Schizophrenia (HCC)   Hyperlipidemia, unspecified   Hypertension   Diabetes mellitus without complication (HCC)  Hypotension/shock, improved Patient was started on vasopressors 2/11 Weaned off vasopressors and transferred to MedSurg status Started on Solu-Cortef and midodrine by intensivist Blood pressure improved over interval Plan: DC midodrine DC Solu-Cortef Start prednisone 50 mg daily Continue to titrate off if blood pressure tolerates   Acute on chronic systolic CHF (congestive heart failure) (HCC): Nonsustained wide-complex tachyarrhythmia  2D echo on  11/30/2018 showed EF of 20-25%.   Patient has elevated BNP 4350, 3+ leg edema bilaterally chest x-ray with cardiomegaly and vascular testing consistent with CHF exacerbation. Follow-up 2D echocardiogram- EF 20- 25%, consistent with prior 12/07/19: Weaned off  vasopressors Plan: Taper IV lasix, decrease dose to 40 IV BID Consider transition to PO lasix from tomorrow Daily weights Strict I's and O's Low-sodium diet 1200 cc fluid restriction Target net -1-1.5L per day (6L net negative since admission) Continue Entresto when blood pressure allows Metoprolol tartrate 50 mg twice daily started by cardiology Amiodarone 200 PO daily started by cardiology  COPD exacerbation ([place]): -Bronchodilators -As needed Mucinex -Prednisone 50 PO daily - Continue to taper steroids  Schizophrenia ([place]): -Patient is haldol injection -Benztropine -ativan prn  Hyperlipidemia, unspecified: -crestor and zetia  HTN:  -Continue home medications  Amlodipine, Coreg -hydralazine prn  Diabetes mellitus without complication ([place]) Most recent A1c 8.3 on 11/29/18, poorly controled.  Patient is taking [place], Metformin, Victoza at home Plan: All oral agents on hold Lantus 10 units daily Sliding scale coverage   DVT prophylaxis: Lovenox Code Status: Full Family Communication: None Disposition Plan: Anticipate returning back home.  Patient has home health and is established with hospice at his current living situation.  We will need to optimize volume status and confirmed hemodynamic stability prior to discharge.  Anticipate discharge within the next 1 to 2 days.  Consultants:   Intensivist  Cardiology  Procedures:   PICC line placement  Antimicrobials:   None   Subjective: Patient seen and examined Currently on 2 L nasal cannula Patient better improved since yesterday  Objective: Vitals:   12/08/19 2028 12/09/19 0358 12/09/19 0754 12/09/19 1227  BP:  (!) 163/127 123/89 (!) 130/96  Pulse:  90 69 72  Resp:  20 18 18   Temp:  97.9 F (36.6 C) 97.7 F (36.5 C) (!) 97.4 F (36.3 C)  TempSrc:  Oral Oral Oral  SpO2: 97% 100% 95% 96%  Weight:  112 kg    Height:        Intake/Output Summary (Last 24 hours) at 12/09/2019 1321 Last data  filed at 12/09/2019 0420 Gross per 24 hour  Intake --  Output 1000 ml  Net -1000 ml   Filed Weights   12/06/19 0500 12/08/19 0519 12/09/19 0358  Weight: 110.9 kg 105.3 kg 112 kg    Examination:  General exam: Appears calm and comfortable  Respiratory system: Decreased air movement, scattered mild wheeze, normal respiratory effort, bibasilar crackles Cardiovascular system: S1-S2, no murmurs, 3+ pitting edema bilateral, positive JVD  gastrointestinal system: Abdomen is nondistended, soft and nontender. No organomegaly or masses felt. Normal bowel sounds heard. Central nervous system: Alert and oriented. No focal neurological deficits. Extremities: Symmetric 5 x 5 power. Skin: No rashes, lesions or ulcers Psychiatry: Judgement and insight appear normal. Mood & affect appropriate.     Data Reviewed: I have personally reviewed following labs and imaging studies  CBC: Recent Labs  Lab 12/03/19 1256 12/06/19 0033  WBC 8.4 15.6*  HGB 15.4 13.8  HCT 52.4* 47.3  MCV 89.7 89.9  PLT 308 025   Basic Metabolic Panel: Recent Labs  Lab 12/04/19 0403 12/05/19 0449 12/06/19 0033 12/07/19 0457 12/08/19 0600  NA 142 137 139 140 138  K 4.5 4.4 4.4 5.3* 4.1  CL 92* 92* 96* 94* 92*  CO2 40* 37* 38* 38* 41*  GLUCOSE 200* 201* 233* 165* 169*  BUN 19 29* 38* 26* 20  CREATININE 1.05 1.02 1.25*  0.82 0.76  CALCIUM 8.8* 7.8* 7.1* 7.7* 7.9*  MG 2.0 1.9 1.8 2.1  --   PHOS  --  5.2*  --   --   --    GFR: Estimated Creatinine Clearance: 139.6 mL/min (by C-G formula based on SCr of 0.76 mg/dL). Liver Function Tests: No results for input(s): AST, ALT, ALKPHOS, BILITOT, PROT, ALBUMIN in the last 168 hours. No results for input(s): LIPASE, AMYLASE in the last 168 hours. No results for input(s): AMMONIA in the last 168 hours. Coagulation Profile: No results for input(s): INR, PROTIME in the last 168 hours. Cardiac Enzymes: No results for input(s): CKTOTAL, CKMB, CKMBINDEX, TROPONINI in the  last 168 hours. BNP (last 3 results) No results for input(s): PROBNP in the last 8760 hours. HbA1C: No results for input(s): HGBA1C in the last 72 hours. CBG: Recent Labs  Lab 12/08/19 1137 12/08/19 1608 12/08/19 2123 12/09/19 0755 12/09/19 1228  GLUCAP 249* 215* 223* 160* 167*   Lipid Profile: No results for input(s): CHOL, HDL, LDLCALC, TRIG, CHOLHDL, LDLDIRECT in the last 72 hours. Thyroid Function Tests: No results for input(s): TSH, T4TOTAL, FREET4, T3FREE, THYROIDAB in the last 72 hours. Anemia Panel: No results for input(s): VITAMINB12, FOLATE, FERRITIN, TIBC, IRON, RETICCTPCT in the last 72 hours. Sepsis Labs: No results for input(s): PROCALCITON, LATICACIDVEN in the last 168 hours.  Recent Results (from the past 240 hour(s))  MRSA PCR Screening     Status: None   Collection Time: 12/03/19 12:24 PM   Specimen: Nasal Mucosa; Nasopharyngeal  Result Value Ref Range Status   MRSA by PCR NEGATIVE NEGATIVE Final    Comment:        The GeneXpert MRSA Assay (FDA approved for NASAL specimens only), is one component of a comprehensive MRSA colonization surveillance program. It is not intended to diagnose MRSA infection nor to guide or monitor treatment for MRSA infections. Performed at [facility], [address] [place], [place] [address]   Respiratory Panel by RT PCR (Flu A&B, Covid) - Nasopharyngeal Swab     Status: None   Collection Time: 12/03/19 12:24 PM   Specimen: Nasopharyngeal Swab  Result Value Ref Range Status   SARS Coronavirus 2 by RT PCR NEGATIVE NEGATIVE Final    Comment: (NOTE) SARS-CoV-2 target nucleic acids are NOT DETECTED. The SARS-CoV-2 RNA is generally detectable in upper respiratoy specimens during the acute phase of infection. The lowest concentration of SARS-CoV-2 viral copies this assay can detect is 131 copies/mL. A negative result does not preclude SARS-Cov-2 infection and should not be used as the sole basis for treatment  or other patient management decisions. A negative result may occur with  improper specimen collection/handling, submission of specimen other than nasopharyngeal swab, presence of viral mutation(s) within the areas targeted by this assay, and inadequate number of viral copies (<131 copies/mL). A negative result must be combined with clinical observations, patient history, and epidemiological information. The expected result is Negative. Fact Sheet for Patients:  https://www.moore.com/ Fact Sheet for Healthcare Providers:  https://www.young.biz/ This test is not yet ap proved or cleared by the [place] FDA and  has been authorized for detection and/or diagnosis of SARS-CoV-2 by FDA under an Emergency Use Authorization (EUA). This EUA will remain  in effect (meaning this test can be used) for the duration of the COVID-19 declaration under Section 564(b)(1) of the Act, 21 U.S.C. section 360bbb-3(b)(1), unless the authorization is terminated or revoked sooner.    Influenza A by PCR NEGATIVE NEGATIVE Final  Influenza B by PCR NEGATIVE NEGATIVE Final    Comment: (NOTE) The Xpert Xpress SARS-CoV-2/FLU/RSV assay is intended as an aid in  the diagnosis of influenza from Nasopharyngeal swab specimens and  should not be used as a sole basis for treatment. Nasal washings and  aspirates are unacceptable for Xpert Xpress SARS-CoV-2/FLU/RSV  testing. Fact Sheet for Patients: https://www.moore.com/ Fact Sheet for Healthcare Providers: https://www.young.biz/ This test is not yet approved or cleared by the [place] FDA and  has been authorized for detection and/or diagnosis of SARS-CoV-2 by  FDA under an Emergency Use Authorization (EUA). This EUA will remain  in effect (meaning this test can be used) for the duration of the  Covid-19 declaration under Section 564(b)(1) of the Act, 21  U.S.C. section  360bbb-3(b)(1), unless the authorization is  terminated or revoked. Performed at [facility], [address], [place], [place] [address]          Radiology Studies: DG Chest [place] 1 View  Result Date: 12/08/2019 CLINICAL DATA:  Hypoxia, CHF, COPD EXAM: PORTABLE CHEST 1 VIEW COMPARISON:  12/06/2019 FINDINGS: The heart size and mediastinal contours are stable. Persistent hazy bibasilar opacities, unchanged. Probable trace bilateral pleural effusions. No pneumothorax. Right-sided PICC line terminates at the level of the distal SVC. IMPRESSION: Stable hazy bibasilar opacities. Electronically Signed   By: Duanne Guess D.O.   On: 12/08/2019 10:07        Scheduled Meds: . amiodarone  200 mg Oral Daily  . chlorhexidine  15 mL Mouth Rinse BID  . Chlorhexidine Gluconate Cloth  6 each Topical Daily  . enoxaparin (LOVENOX) injection  40 mg Subcutaneous Q24H  . folic acid  1 mg Oral Daily  . furosemide  40 mg Intravenous Q12H  . insulin aspart  0-5 Units Subcutaneous QHS  . insulin aspart  0-9 Units Subcutaneous TID WC  . insulin glargine  15 Units Subcutaneous Daily  . ipratropium-albuterol  3 mL Nebulization BID  . mouth rinse  15 mL Mouth Rinse q12n4p  . metoprolol tartrate  50 mg Oral BID  . multivitamin with minerals  1 tablet Oral Daily  . [START ON 12/10/2019] predniSONE  50 mg Oral Q breakfast  . sodium chloride flush  10-40 mL Intracatheter Q12H  . sodium chloride flush  3 mL Intravenous Q12H  . thiamine  100 mg Oral Daily   Continuous Infusions: . sodium chloride 10 mL/hr at 12/06/19 1700     LOS: 6 days    Time spent: 35 minutes    Tresa Moore, MD [facility] Pager [phone]  If 7PM-7AM, please contact night-coverage  12/09/2019, 1:21 PM

## 2019-12-09 NOTE — Evaluation (Signed)
Physical Therapy Evaluation Patient Details Name: Paul Hernandez MRN: [id number] DOB: [birth year] Today's Date: 12/09/2019   History of Present Illness  Per MD notes:  Pt is a 54 y.o. male with medical history significant of sCHF with EF 20-25%, hypertension, diabetes mellitus, COPD, schizophrenia, who presented with shortness of breath.  MD assessment includes: Acute on chronic systolic CHF, Nonsustained wide-complex tachyarrhythmia, and COPD exacerbation.    Clinical Impression  Pt required encouragement to participate during the session and perseverated on wanting to eat requiring frequent redirection to stay on task.  Pt was unable to provide any history with uncle contacted at [phone] with pt permission but uncle unable to provide much detail on pt's history or PLOF other than he lives at a group home. Pt did not require physical assistance with mobility tasks but did require extra time and effort and would only ambulate room distances secondary to stating "I'm hungry".  Pt's SpO2 on 2LO2/min remained in the high 80s to low 90s during the session.  Pt limited significantly by cognitive deficits during the session but did appear grossly weak based on effort required with functional tasks and is at high risk for further functional decline.  Pt will benefit from HHPT services upon discharge to safely address deficits listed in patient problem list for decreased caregiver assistance and eventual return to PLOF.       Follow Up Recommendations Home health PT;Supervision for mobility/OOB    Equipment Recommendations  Other (comment)(Pt would benefit from a RW but unsure if pt currently has access to one at his group home)    Recommendations for Other Services       Precautions / Restrictions Precautions Precautions: Fall Restrictions Weight Bearing Restrictions: No      Mobility  Bed Mobility Overal bed mobility: Modified Independent             General bed mobility comments:  Extra time and effort required  Transfers Overall transfer level: Needs assistance Equipment used: Rolling walker (2 wheeled) Transfers: Sit to/from Stand Sit to Stand: Min guard;From elevated surface         General transfer comment: Fair eccentric and concentric control  Ambulation/Gait Ambulation/Gait assistance: Min guard Gait Distance (Feet): 12 Feet Assistive device: Rolling walker (2 wheeled) Gait Pattern/deviations: Step-through pattern;Decreased step length - right;Decreased step length - left Gait velocity: decreased   General Gait Details: Pt stated that he was tired and initially declined amb but with encouragement was willing to walk in the room  Stairs            Wheelchair Mobility    Modified Rankin (Stroke Patients Only)       Balance Overall balance assessment: Needs assistance Sitting-balance support: Bilateral upper extremity supported;Feet supported Sitting balance-Leahy Scale: Good     Standing balance support: Bilateral upper extremity supported Standing balance-Leahy Scale: Fair Standing balance comment: Mod lean on the RW for support                             Pertinent Vitals/Pain Pain Assessment: No/denies pain    Home Living Family/patient expects to be discharged to:: Group home Living Arrangements: Group Home Available Help at Discharge: Available PRN/intermittently Type of Home: Group Home Home Access: Stairs to enter Entrance Stairs-Rails: (Unsure) Entrance Stairs-Number of Steps: 3 Home Layout: One level   Additional Comments: Pt unable to provide history secondary to cognitive deficits with uncle called with pt permission but only able  to provide limited history per above    Prior Function           Comments: Unknown; pt's uncle stated that staff at group home provides meals but unsure of pt's ambulatory status/details or if pt required assist with ADLs     Hand Dominance        Extremity/Trunk  Assessment   Upper Extremity Assessment Upper Extremity Assessment: Generalized weakness    Lower Extremity Assessment Lower Extremity Assessment: Generalized weakness       Communication      Cognition Arousal/Alertness: Awake/alert Behavior During Therapy: Flat affect Overall Cognitive Status: No family/caregiver present to determine baseline cognitive functioning                                 General Comments: Pt unable to provide history and perseverated on wanting to eat and drink during the session      General Comments      Exercises Total Joint Exercises Ankle Circles/Pumps: Strengthening;Both;10 reps Quad Sets: Strengthening;Both;10 reps Gluteal Sets: Strengthening;Both;10 reps Heel Slides: AROM;Both;5 reps Hip ABduction/ADduction: AROM;AAROM;Both;10 reps Long Arc Quad: AROM;Strengthening;Both;10 reps Knee Flexion: AROM;Strengthening;Both;10 reps   Assessment/Plan    PT Assessment Patient needs continued PT services  PT Problem List Decreased strength;Decreased activity tolerance;Decreased balance;Decreased mobility;Decreased knowledge of use of DME       PT Treatment Interventions DME instruction;Gait training;Stair training;Functional mobility training;Therapeutic activities;Therapeutic exercise;Balance training;Patient/family education    PT Goals (Current goals can be found in the Care Plan section)  Acute Rehab PT Goals PT Goal Formulation: Patient unable to participate in goal setting Time For Goal Achievement: 12/22/19 Potential to Achieve Goals: Fair    Frequency Min 2X/week   Barriers to discharge        Co-evaluation               AM-PAC PT "6 Clicks" Mobility  Outcome Measure Help needed turning from your back to your side while in a flat bed without using bedrails?: A Little Help needed moving from lying on your back to sitting on the side of a flat bed without using bedrails?: A Little Help needed moving to and  from a bed to a chair (including a wheelchair)?: A Little Help needed standing up from a chair using your arms (e.g., wheelchair or bedside chair)?: A Little Help needed to walk in hospital room?: A Little Help needed climbing 3-5 steps with a railing? : A Lot 6 Click Score: 17    End of Session Equipment Utilized During Treatment: Gait belt;Oxygen Activity Tolerance: Patient tolerated treatment well Patient left: in chair;with call bell/phone within reach;with chair alarm set Nurse Communication: Mobility status;Other (comment)(Pt requesting food and drink) PT Visit Diagnosis: Muscle weakness (generalized) (M62.81);Difficulty in walking, not elsewhere classified (R26.2)    Time: 1525-1600 PT Time Calculation (min) (ACUTE ONLY): 35 min   Charges:   PT Evaluation $PT Eval Moderate Complexity: 1 Mod PT Treatments $Therapeutic Exercise: 8-22 mins        D. Elly Modena PT, DPT 12/09/19, 5:31 PM

## 2019-12-09 NOTE — Progress Notes (Signed)
[facility] Encounter Note  Patient: Paul Hernandez / Admit Date: 12/03/2019 / Date of Encounter: 12/09/2019, 8:44 AM   Subjective: Patient feels relatively well today no evidence of significant new symptoms of chest pain or shortness of breath.  Patient overall rested comfortably overnight patient's slowly improving from COPD and lower extremity edema and acute on chronic systolic dysfunction heart failure with Lasix and oxygen supplementation.  Patient did have a long run of wide-complex tachycardia yesterday  and currently in normal sinus rhythm at 76 bpm today and this is after addition of beta-blocker.  This may be secondary to cardiomyopathy requiring further medication management.  Patient did not have any symptoms suggesting syncope dizziness Echocardiogram reestablished patient has a severe LV systolic dysfunction with ejection fraction of 20% Review of Systems: Positive for: None Negative for: Vision change, hearing change, syncope, dizziness, nausea, vomiting,diarrhea, bloody stool, stomach pain, cough, congestion, diaphoresis, urinary frequency, urinary pain,skin lesions, skin rashes Others previously listed  Objective: Telemetry: Normal sinus rhythm Physical Exam: Blood pressure 123/89, pulse 69, temperature 97.7 F (36.5 C), temperature source Oral, resp. rate 18, height 6' (1.829 m), weight 112 kg, SpO2 95 %. Body mass index is 33.5 kg/m. General: Well developed, well nourished, in no acute distress. Head: Normocephalic, atraumatic, sclera non-icteric, no xanthomas, nares are without discharge. Neck: No apparent masses Lungs: Normal respirations with few wheezes, no rhonchi, no rales , basilar crackles   Heart: Regular rate and rhythm, normal S1 S2, no murmur, no rub, no gallop, PMI is normal size and placement, carotid upstroke normal without bruit, jugular venous pressure normal Abdomen: Soft, non-tender, non-distended with normoactive bowel sounds. No  hepatosplenomegaly. Abdominal aorta is normal size without bruit Extremities: 1-2+ edema, no clubbing, no cyanosis, no ulcers,  Peripheral: 2+ radial, 2+ femoral, 2+ dorsal pedal pulses Neuro: Alert and oriented. Moves all extremities spontaneously. Psych:  Responds to questions appropriately with a normal affect.   Intake/Output Summary (Last 24 hours) at 12/09/2019 0844 Last data filed at 12/09/2019 0420 Gross per 24 hour  Intake 133 ml  Output 1400 ml  Net -1267 ml    Inpatient Medications:  . chlorhexidine  15 mL Mouth Rinse BID  . Chlorhexidine Gluconate Cloth  6 each Topical Daily  . enoxaparin (LOVENOX) injection  40 mg Subcutaneous Q24H  . folic acid  1 mg Oral Daily  . furosemide  60 mg Intravenous Q12H  . hydrocortisone sod succinate (SOLU-CORTEF) inj  50 mg Intravenous Q12H  . insulin aspart  0-5 Units Subcutaneous QHS  . insulin aspart  0-9 Units Subcutaneous TID WC  . insulin glargine  15 Units Subcutaneous Daily  . ipratropium-albuterol  3 mL Nebulization BID  . mouth rinse  15 mL Mouth Rinse q12n4p  . metoprolol tartrate  50 mg Oral BID  . midodrine  10 mg Oral BID WC  . multivitamin with minerals  1 tablet Oral Daily  . sodium chloride flush  10-40 mL Intracatheter Q12H  . sodium chloride flush  3 mL Intravenous Q12H  . thiamine  100 mg Oral Daily   Infusions:  . sodium chloride 10 mL/hr at 12/06/19 1700    Labs: Recent Labs    12/07/19 0457 12/08/19 0600  NA 140 138  K 5.3* 4.1  CL 94* 92*  CO2 38* 41*  GLUCOSE 165* 169*  BUN 26* 20  CREATININE 0.82 0.76  CALCIUM 7.7* 7.9*  MG 2.1  --    No results for input(s): AST, ALT, ALKPHOS, BILITOT,  PROT, ALBUMIN in the last 72 hours. No results for input(s): WBC, NEUTROABS, HGB, HCT, MCV, PLT in the last 72 hours. No results for input(s): CKTOTAL, CKMB, TROPONINI in the last 72 hours. Invalid input(s): POCBNP No results for input(s): HGBA1C in the last 72 hours.   Weights: Filed Weights   12/06/19  0500 12/08/19 0519 12/09/19 0358  Weight: 110.9 kg 105.3 kg 112 kg     Radiology/Studies:  DG Chest Port 1 View  Result Date: 12/08/2019 CLINICAL DATA:  Hypoxia, CHF, COPD EXAM: PORTABLE CHEST 1 VIEW COMPARISON:  12/06/2019 FINDINGS: The heart size and mediastinal contours are stable. Persistent hazy bibasilar opacities, unchanged. Probable trace bilateral pleural effusions. No pneumothorax. Right-sided PICC line terminates at the level of the distal SVC. IMPRESSION: Stable hazy bibasilar opacities. Electronically Signed   By: Duanne Guess D.O.   On: 12/08/2019 10:07   DG Chest Port 1 View  Result Date: 12/06/2019 CLINICAL DATA:  Pulmonary disease. EXAM: PORTABLE CHEST 1 VIEW COMPARISON:  12/03/2019 FINDINGS: Enlargement of the cardiac silhouette is unchanged. Hazy opacity in the right lung base is unchanged and compatible with a small pleural effusion and airspace opacity. There is mildly increased hazy opacity in the left lung base compared to the prior study as well which may also reflect in part a small pleural effusion. No pneumothorax is identified. A right PICC has been placed in terminates over the mid SVC. IMPRESSION: Unchanged right and new left basilar opacities likely reflecting small pleural effusions and atelectasis or infiltrate. Electronically Signed   By: Sebastian Ache M.D.   On: 12/06/2019 09:50   DG Chest Port 1 View  Result Date: 12/03/2019 CLINICAL DATA:  Shortness of breath EXAM: PORTABLE CHEST 1 VIEW COMPARISON:  07/08/2019 FINDINGS: Cardiomegaly with vascular congestion. Small right pleural effusion. Right basilar atelectasis or infiltrate. Left lung clear. No edema. No acute bony abnormality. IMPRESSION: Cardiomegaly with vascular congestion. Small right effusion with right base atelectasis or infiltrate. Electronically Signed   By: Charlett Nose M.D.   On: 12/03/2019 15:06   ECHOCARDIOGRAM COMPLETE  Result Date: 12/04/2019    ECHOCARDIOGRAM REPORT   Patient Name:    Paul Hernandez Date of Exam: 12/04/2019 Medical Rec #:  [id number]     Height:       72.0 in Accession #:    [id number]    Weight:       239.6 lb Date of Birth:  April 07, 1966    BSA:          2.30 m Patient Age:    53 years      BP:           106/91 mmHg Patient Gender: M             HR:           77 bpm. Exam Location:  [facility] Procedure: 2D Echo, Color Doppler, Cardiac Doppler and Intracardiac            Opacification Agent Indications:     I50.31 CHF-Acute Diastolic  History:         Patient has prior history of Echocardiogram examinations. CHF,                  COPD; Risk Factors:Hypertension and Diabetes.  Sonographer:     Humphrey Rolls RDCS (AE) Referring Phys:  [id number] Brien Few NIU Diagnosing Phys: Julien Nordmann MD IMPRESSIONS  1. Left ventricular ejection fraction, by estimation, is 20 to 25%. Left ventricular ejection fraction by PLAX  is 19 %. The left ventricle has severely decreased function. The left ventrical demonstrates global hypokinesis. The left ventricular internal  cavity size was severely dilated. Left ventricular diastolic parameters are consistent with Grade I diastolic dysfunction (impaired relaxation).  2. Right ventricular systolic function is moderately reduced. The right ventricular size is mildly enlarged. There is normal pulmonary artery systolic pressure. FINDINGS  Left Ventricle: Left ventricular ejection fraction, by estimation, is 20 to 25%. Left ventricular ejection fraction by PLAX is 19 % The left ventricle has severely decreased function. The left ventricle demonstrates global hypokinesis. Definity contrast  agent was given IV to delineate the left ventricular endocardial borders. The left ventricular internal cavity size was severely dilated. There is no left ventricular hypertrophy. Left ventricular diastolic parameters are consistent with Grade I diastolic dysfunction (impaired relaxation). Right Ventricle: The right ventricular size is mildly enlarged. No increase in right ventricular wall  thickness. Right ventricular systolic function is moderately reduced. There is normal pulmonary artery systolic pressure. The tricuspid regurgitant velocity is 2.06 m/s, and with an assumed right atrial pressure of 10 mmHg, the estimated right ventricular systolic pressure is 27.0 mmHg. Left Atrium: Left atrial size was normal in size. Right Atrium: Right atrial size was normal in size. Pericardium: There is no evidence of pericardial effusion. Mitral Valve: The mitral valve is normal in structure and function. Normal mobility of the mitral valve leaflets. Mild mitral valve regurgitation. No evidence of mitral valve stenosis. MV peak gradient, 1.9 mmHg. The mean mitral valve gradient is 1.0 mmHg. Tricuspid Valve: The tricuspid valve is normal in structure. Tricuspid valve regurgitation is not demonstrated. No evidence of tricuspid stenosis. Aortic Valve: The aortic valve is normal in structure and function. Aortic valve regurgitation is not visualized. Mild to moderate aortic valve sclerosis/calcification is present, without any evidence of aortic stenosis. Aortic valve mean gradient measures 4.0 mmHg. Aortic valve peak gradient measures 7.7 mmHg. Aortic valve area, by VTI measures 2.01 cm. Pulmonic Valve: The pulmonic valve was normal in structure. Pulmonic valve regurgitation is not visualized. No evidence of pulmonic stenosis. Aorta: The aortic root is normal in size and structure. Venous: The inferior vena cava is normal in size with greater than 50% respiratory variability, suggesting right atrial pressure of 3 mmHg. The inferior vena cava and the hepatic vein show a normal flow pattern. IAS/Shunts: No atrial level shunt detected by color flow Doppler.  LEFT VENTRICLE PLAX 2D LV EF:         Left            Diastology                ventricular     LV e' lateral:   5.00 cm/s                ejection        LV E/e' lateral: 14.0                fraction by                PLAX is 19                % LVIDd:          6.14 cm LVIDs:         5.60 cm LV PW:         1.11 cm LV IVS:        0.92 cm LVOT diam:     2.20 cm LV  SV:         46.00 ml LV SV Index:   15.15 LVOT Area:     3.80 cm  RIGHT VENTRICLE RV Basal diam:  4.20 cm TAPSE (M-mode): 1.6 cm LEFT ATRIUM             Index       RIGHT ATRIUM           Index LA diam:        3.80 cm 1.65 cm/m  RA Area:     24.20 cm LA Vol (A2C):   57.7 ml 25.08 ml/m RA Volume:   78.00 ml  33.90 ml/m LA Vol (A4C):   48.0 ml 20.86 ml/m LA Biplane Vol: 55.7 ml 24.21 ml/m  AORTIC VALVE                   PULMONIC VALVE AV Area (Vmax):    2.73 cm    PV Vmax:       0.86 m/s AV Area (Vmean):   2.13 cm    PV Vmean:      54.200 cm/s AV Area (VTI):     2.01 cm    PV VTI:        0.121 m AV Vmax:           139.00 cm/s PV Peak grad:  3.0 mmHg AV Vmean:          94.700 cm/s PV Mean grad:  1.0 mmHg AV VTI:            0.229 m AV Peak Grad:      7.7 mmHg AV Mean Grad:      4.0 mmHg LVOT Vmax:         99.80 cm/s LVOT Vmean:        53.100 cm/s LVOT VTI:          0.121 m LVOT/AV VTI ratio: 0.53  AORTA Ao Root diam: 3.30 cm MITRAL VALVE                        TRICUSPID VALVE MV Area (PHT): 5.81 cm             TR Peak grad:   17.0 mmHg MV Peak grad:  1.9 mmHg             TR Vmax:        206.00 cm/s MV Mean grad:  1.0 mmHg MV Vmax:       0.69 m/s             SHUNTS MV Vmean:      48.5 cm/s            Systemic VTI:  0.12 m MV Decel Time: 131 msec             Systemic Diam: 2.20 cm MV E velocity: 69.85 cm/s 103 cm/s MV A velocity: 73.00 cm/s 70.3 cm/s MV E/A ratio:  0.96       1.5 Julien Nordmann MD Electronically signed by Julien Nordmann MD Signature Date/Time: 12/04/2019/6:12:28 PM    Final    [place] EKG SITE RITE  Result Date: 12/05/2019 If Site Rite image not attached, placement could not be confirmed due to current cardiac rhythm.    Assessment and Recommendation  54 y.o. male with known chronic systolic dysfunction congestive heart failure with acute on chronic systolic dysfunction congestive heart  failure with elevated BNP and pulmonary edema slightly improved with appropriate medication management  including intravenous Lasix and no current evidence of myocardial infarction.  Patient does have some wide-complex tachycardia needing further medication management after blood pressure is improved 1.  Continue acute on chronic systolic dysfunction congestive heart failure treatment with Lasix continuing to improve hypoxia and edema slowly now with oral Lasix 2.  No further cardiac diagnostics necessary at this time with echocardiogram showing severe LV dysfunction with ejection fraction of 20% 3.  Treatment of wide-complex tachycardia cardiomyopathy with beta-blocker and increased dose as necessary for heart rate between 60 and 70 bpm at baseline and reduction of episodes of tachycardia after patient has better and more stable blood pressure which appears to be accomplished at this time  May have to use amiodarone although am concerned about COPD with amiodarone use and lung concerns.  They have to have pulmonology give recommendation 4.  Continue treatment of any hypoxia or other lung disease exacerbation as per pulmonology which appears to be improving 5.  Reinstatement of Entresto or other antihypertensives as necessary for cardiomyopathy and if able 6.  Begin ambulation and improvements for possible discharge to home  Signed, Serafina Royals M.D. FACC

## 2019-12-10 ENCOUNTER — Encounter: Payer: Self-pay | Admitting: Internal Medicine

## 2019-12-10 LAB — GLUCOSE, CAPILLARY
Glucose-Capillary: 98 mg/dL (ref 70–99)
Glucose-Capillary: 133 mg/dL — ABNORMAL HIGH (ref 70–99)

## 2019-12-10 LAB — BASIC METABOLIC PANEL
Anion gap: 11 (ref 5–15)
Anion gap: 8 (ref 5–15)
BUN: 20 mg/dL (ref 6–20)
BUN: 22 mg/dL — ABNORMAL HIGH (ref 6–20)
CO2: 42 mmol/L — ABNORMAL HIGH (ref 22–32)
CO2: 42 mmol/L — ABNORMAL HIGH (ref 22–32)
Calcium: 7.7 mg/dL — ABNORMAL LOW (ref 8.9–10.3)
Calcium: 8.1 mg/dL — ABNORMAL LOW (ref 8.9–10.3)
Chloride: 89 mmol/L — ABNORMAL LOW (ref 98–111)
Chloride: 89 mmol/L — ABNORMAL LOW (ref 98–111)
Creatinine, Ser: 0.8 mg/dL (ref 0.61–1.24)
Creatinine, Ser: 0.91 mg/dL (ref 0.61–1.24)
GFR calc Af Amer: >60 mL/min (ref >60)
GFR calc Af Amer: >60 mL/min (ref >60)
GFR calc non Af Amer: >60 mL/min (ref >60)
GFR calc non Af Amer: >60 mL/min (ref >60)
Glucose, Bld: 108 mg/dL — ABNORMAL HIGH (ref 70–99)
Glucose, Bld: 182 mg/dL — ABNORMAL HIGH (ref 70–99)
Potassium: 3.4 mmol/L — ABNORMAL LOW (ref 3.5–5.1)
Potassium: 3.5 mmol/L (ref 3.5–5.1)
Sodium: 139 mmol/L (ref 135–145)
Sodium: 142 mmol/L (ref 135–145)

## 2019-12-10 LAB — MAGNESIUM
Magnesium: 1.8 mg/dL (ref 1.7–2.4)
Magnesium: 2.4 mg/dL (ref 1.7–2.4)

## 2019-12-10 MED ORDER — MAGNESIUM SULFATE 2 GM/50ML IV SOLN: 2.0000 g | Freq: Once | INTRAVENOUS | Status: DC

## 2019-12-10 MED ORDER — MAGNESIUM SULFATE 2 GM/50ML IV SOLN
2.0000 g | Freq: Once | INTRAVENOUS | Status: AC
  Administered 2019-12-10: 10:00:00 2 g via INTRAVENOUS
  Filled 2019-12-10: qty 50

## 2019-12-10 MED ORDER — POTASSIUM CHLORIDE CRYS ER 20 MEQ PO TBCR: 20.0000 meq | EXTENDED_RELEASE_TABLET | Freq: Every day | ORAL | 0 refills | Status: DC

## 2019-12-10 MED ORDER — POTASSIUM CHLORIDE CRYS ER 20 MEQ PO TBCR: 40.0000 meq | EXTENDED_RELEASE_TABLET | Freq: Two times a day (BID) | ORAL | Status: DC

## 2019-12-10 MED ORDER — METOPROLOL TARTRATE 50 MG PO TABS: 50.0000 mg | ORAL_TABLET | Freq: Two times a day (BID) | ORAL | 0 refills | Status: DC

## 2019-12-10 MED ORDER — AMIODARONE HCL 200 MG PO TABS: 200.0000 mg | ORAL_TABLET | Freq: Every day | ORAL | 0 refills | Status: AC

## 2019-12-10 MED ORDER — POTASSIUM CHLORIDE CRYS ER 20 MEQ PO TBCR
40.0000 meq | EXTENDED_RELEASE_TABLET | Freq: Two times a day (BID) | ORAL | Status: DC
  Administered 2019-12-10: 10:00:00 40 meq via ORAL
  Filled 2019-12-10: qty 2

## 2019-12-10 NOTE — Progress Notes (Signed)
   At 2112 patient had a 15 second run of SVT, HR up to 160, per tele monitor. Pt. remained asymptomatic. Provider notified, [[facility], NP], no new orders.   Patient continues to have infrequent runs of SVT/VT. Asymptomatic, patient denies any new SOB/CP. Will CTM.

## 2019-12-10 NOTE — Plan of Care (Signed)

## 2019-12-10 NOTE — Discharge Summary (Addendum)
Physician Discharge Summary  Fordyce Lepak VCB:[id number] DOB: May 07, 1966 DOA: 12/03/2019  PCP: [name], FNP  Admit date: 12/03/2019 Discharge date: 12/10/2019  Discharge disposition: Home with home health PT/group home   Recommendations for Outpatient Follow-Up:   Outpatient follow-up with PCP and cardiologist in 1 to 2 weeks   Discharge Diagnosis:   Principal Problem:   Acute on chronic systolic CHF (congestive heart failure) ([place]) Active Problems:   COPD exacerbation ([place])   Schizophrenia ([place])   Hyperlipidemia, unspecified   Hypertension   Diabetes mellitus without complication ([place])    Discharge Condition: Stable.  Diet recommendation: Low-salt and low sugar diet  Code status: Full code   Hospital Course:    Mr. Cevin Rubinstein is a 54 year old man with medical history significant for chronic systolic CHF (EF estimated at 20 to 25%, hypertension, diabetes mellitus, COPD, schizophrenia, who was referred to the hospital as a direct admission from the clinic for worsening shortness of breath, wheezing and bilateral leg edema.  He was admitted to the hospital for acute exacerbation of chronic systolic CHF and possible COPD exacerbation.  It is worth noting that patient was hypotensive in the early course of hospitalization and required vasopressors, IV steroids and midodrine in the ICU.  He was treated with IV Lasix, steroids and bronchodilators.  He was also treated with BiPAP for acute hypoxemic respiratory failure he has been weaned off of BiPAP to 2 L/min oxygen via nasal cannula.  He was evaluated by cardiologist to assist with management.  Patient also had episodes of nonsustained V. Tach and amiodarone was added.  He was evaluated by physical therapist who recommended home health PT at discharge.  His condition has improved and he is deemed stable for discharge today.  Discharge plan was discussed with his friend/healthcare power of attorney, Mr. Renetta Chalk.  Mr. Philip Aspen confirmed that patient has hospice in place at the group home but prefers that all his medications be continued.      Discharge Exam:   Vitals:   12/10/19 0814 12/10/19 1152  BP: (!) 137/109 (!) 152/110  Pulse: 76 76  Resp:    Temp: 97.7 F (36.5 C) 98.2 F (36.8 C)  SpO2: 99% 99%   Vitals:   12/10/19 0602 12/10/19 0755 12/10/19 0814 12/10/19 1152  BP:   (!) 137/109 (!) 152/110  Pulse: 70  76 76  Resp:      Temp:   97.7 F (36.5 C) 98.2 F (36.8 C)  TempSrc:   Oral Oral  SpO2:  96% 99% 99%  Weight:      Height:         GEN: NAD SKIN: No rash EYES: EOMI ENT: MMM CV: RRR PULM: CTA B ABD: soft, obese, NT, +BS CNS: AAO x 3, non focal EXT: Bilateral leg edema but no erythema or tenderness   The results of significant diagnostics from this hospitalization (including imaging, microbiology, ancillary and laboratory) are listed below for reference.     Procedures and Diagnostic Studies:   ECHOCARDIOGRAM COMPLETE  Result Date: 12/04/2019    ECHOCARDIOGRAM REPORT   Patient Name:   KAYDYN SAYAS Date of Exam: 12/04/2019 Medical Rec #:  [id number]     Height:       72.0 in Accession #:    [id number]    Weight:       239.6 lb Date of Birth:  17-Mar-1966    BSA:          2.30 m Patient  Age:    54 years      BP:           106/91 mmHg Patient Gender: M             HR:           77 bpm. Exam Location:  [facility] Procedure: 2D Echo, Color Doppler, Cardiac Doppler and Intracardiac            Opacification Agent Indications:     I50.31 CHF-Acute Diastolic  History:         Patient has prior history of Echocardiogram examinations. CHF,                  COPD; Risk Factors:Hypertension and Diabetes.  Sonographer:     Charmayne Sheer RDCS (AE) Referring Phys:  1949 Soledad Gerlach NIU Diagnosing Phys: Ida Rogue MD IMPRESSIONS  1. Left ventricular ejection fraction, by estimation, is 20 to 25%. Left ventricular ejection fraction by PLAX is 19 %. The left ventricle has severely  decreased function. The left ventrical demonstrates global hypokinesis. The left ventricular internal  cavity size was severely dilated. Left ventricular diastolic parameters are consistent with Grade I diastolic dysfunction (impaired relaxation).  2. Right ventricular systolic function is moderately reduced. The right ventricular size is mildly enlarged. There is normal pulmonary artery systolic pressure. FINDINGS  Left Ventricle: Left ventricular ejection fraction, by estimation, is 20 to 25%. Left ventricular ejection fraction by PLAX is 19 % The left ventricle has severely decreased function. The left ventricle demonstrates global hypokinesis. Definity contrast  agent was given IV to delineate the left ventricular endocardial borders. The left ventricular internal cavity size was severely dilated. There is no left ventricular hypertrophy. Left ventricular diastolic parameters are consistent with Grade I diastolic dysfunction (impaired relaxation). Right Ventricle: The right ventricular size is mildly enlarged. No increase in right ventricular wall thickness. Right ventricular systolic function is moderately reduced. There is normal pulmonary artery systolic pressure. The tricuspid regurgitant velocity is 2.06 m/s, and with an assumed right atrial pressure of 10 mmHg, the estimated right ventricular systolic pressure is 93.2 mmHg. Left Atrium: Left atrial size was normal in size. Right Atrium: Right atrial size was normal in size. Pericardium: There is no evidence of pericardial effusion. Mitral Valve: The mitral valve is normal in structure and function. Normal mobility of the mitral valve leaflets. Mild mitral valve regurgitation. No evidence of mitral valve stenosis. MV peak gradient, 1.9 mmHg. The mean mitral valve gradient is 1.0 mmHg. Tricuspid Valve: The tricuspid valve is normal in structure. Tricuspid valve regurgitation is not demonstrated. No evidence of tricuspid stenosis. Aortic Valve: The aortic  valve is normal in structure and function. Aortic valve regurgitation is not visualized. Mild to moderate aortic valve sclerosis/calcification is present, without any evidence of aortic stenosis. Aortic valve mean gradient measures 4.0 mmHg. Aortic valve peak gradient measures 7.7 mmHg. Aortic valve area, by VTI measures 2.01 cm. Pulmonic Valve: The pulmonic valve was normal in structure. Pulmonic valve regurgitation is not visualized. No evidence of pulmonic stenosis. Aorta: The aortic root is normal in size and structure. Venous: The inferior vena cava is normal in size with greater than 50% respiratory variability, suggesting right atrial pressure of 3 mmHg. The inferior vena cava and the hepatic vein show a normal flow pattern. IAS/Shunts: No atrial level shunt detected by color flow Doppler.  LEFT VENTRICLE PLAX 2D LV EF:         Left  Diastology                ventricular     LV e' lateral:   5.00 cm/s                ejection        LV E/e' lateral: 14.0                fraction by                PLAX is 19                % LVIDd:         6.14 cm LVIDs:         5.60 cm LV PW:         1.11 cm LV IVS:        0.92 cm LVOT diam:     2.20 cm LV SV:         46.00 ml LV SV Index:   15.15 LVOT Area:     3.80 cm  RIGHT VENTRICLE RV Basal diam:  4.20 cm TAPSE (M-mode): 1.6 cm LEFT ATRIUM             Index       RIGHT ATRIUM           Index LA diam:        3.80 cm 1.65 cm/m  RA Area:     24.20 cm LA Vol (A2C):   57.7 ml 25.08 ml/m RA Volume:   78.00 ml  33.90 ml/m LA Vol (A4C):   48.0 ml 20.86 ml/m LA Biplane Vol: 55.7 ml 24.21 ml/m  AORTIC VALVE                   PULMONIC VALVE AV Area (Vmax):    2.73 cm    PV Vmax:       0.86 m/s AV Area (Vmean):   2.13 cm    PV Vmean:      54.200 cm/s AV Area (VTI):     2.01 cm    PV VTI:        0.121 m AV Vmax:           139.00 cm/s PV Peak grad:  3.0 mmHg AV Vmean:          94.700 cm/s PV Mean grad:  1.0 mmHg AV VTI:            0.229 m AV Peak Grad:      7.7 mmHg  AV Mean Grad:      4.0 mmHg LVOT Vmax:         99.80 cm/s LVOT Vmean:        53.100 cm/s LVOT VTI:          0.121 m LVOT/AV VTI ratio: 0.53  AORTA Ao Root diam: 3.30 cm MITRAL VALVE                        TRICUSPID VALVE MV Area (PHT): 5.81 cm             TR Peak grad:   17.0 mmHg MV Peak grad:  1.9 mmHg             TR Vmax:        206.00 cm/s MV Mean grad:  1.0 mmHg MV Vmax:       0.69 m/s  SHUNTS MV Vmean:      48.5 cm/s            Systemic VTI:  0.12 m MV Decel Time: 131 msec             Systemic Diam: 2.20 cm MV E velocity: 69.85 cm/s 103 cm/s MV A velocity: 73.00 cm/s 70.3 cm/s MV E/A ratio:  0.96       1.5 Ida Rogue MD Electronically signed by Ida Rogue MD Signature Date/Time: 12/04/2019/6:12:28 PM    Final    [place] EKG SITE RITE  Result Date: 12/05/2019 If Site Rite image not attached, placement could not be confirmed due to current cardiac rhythm.    Labs:   Basic Metabolic Panel: Recent Labs  Lab 12/05/19 0449 12/05/19 0449 12/06/19 0033 12/06/19 0033 12/07/19 0457 12/07/19 0457 12/08/19 0600 12/08/19 0600 12/09/19 2334 12/10/19 1009  NA 137   < > 139  --  140  --  138  --  139 142  K 4.4   < > 4.4   < > 5.3*   < > 4.1   < > 3.4* 3.5  CL 92*   < > 96*  --  94*  --  92*  --  89* 89*  CO2 37*   < > 38*  --  38*  --  41*  --  42* 42*  GLUCOSE 201*   < > 233*  --  165*  --  169*  --  182* 108*  BUN 29*   < > 38*  --  26*  --  20  --  22* 20  CREATININE 1.02   < > 1.25*  --  0.82  --  0.76  --  0.91 0.80  CALCIUM 7.8*   < > 7.1*  --  7.7*  --  7.9*  --  7.7* 8.1*  MG 1.9  --  1.8  --  2.1  --   --   --  1.8 2.4  PHOS 5.2*  --   --   --   --   --   --   --   --   --    < > = values in this interval not displayed.   GFR Estimated Creatinine Clearance: 140.1 mL/min (by C-G formula based on SCr of 0.8 mg/dL). Liver Function Tests: No results for input(s): AST, ALT, ALKPHOS, BILITOT, PROT, ALBUMIN in the last 168 hours. No results for input(s): LIPASE, AMYLASE  in the last 168 hours. No results for input(s): AMMONIA in the last 168 hours. Coagulation profile No results for input(s): INR, PROTIME in the last 168 hours.  CBC: Recent Labs  Lab 12/03/19 1256 12/06/19 0033  WBC 8.4 15.6*  HGB 15.4 13.8  HCT 52.4* 47.3  MCV 89.7 89.9  PLT 308 263   Cardiac Enzymes: No results for input(s): CKTOTAL, CKMB, CKMBINDEX, TROPONINI in the last 168 hours. BNP: Invalid input(s): POCBNP CBG: Recent Labs  Lab 12/09/19 1228 12/09/19 1641 12/09/19 2139 12/10/19 0813 12/10/19 1150  GLUCAP 167* 140* 157* 98 133*   D-Dimer No results for input(s): DDIMER in the last 72 hours. Hgb A1c No results for input(s): HGBA1C in the last 72 hours. Lipid Profile No results for input(s): CHOL, HDL, LDLCALC, TRIG, CHOLHDL, LDLDIRECT in the last 72 hours. Thyroid function studies No results for input(s): TSH, T4TOTAL, T3FREE, THYROIDAB in the last 72 hours.  Invalid input(s): FREET3 Anemia work up No results for input(s): VITAMINB12, FOLATE, FERRITIN, TIBC,  IRON, RETICCTPCT in the last 72 hours. Microbiology Recent Results (from the past 240 hour(s))  MRSA PCR Screening     Status: None   Collection Time: 12/03/19 12:24 PM   Specimen: Nasal Mucosa; Nasopharyngeal  Result Value Ref Range Status   MRSA by PCR NEGATIVE NEGATIVE Final    Comment:        The GeneXpert MRSA Assay (FDA approved for NASAL specimens only), is one component of a comprehensive MRSA colonization surveillance program. It is not intended to diagnose MRSA infection nor to guide or monitor treatment for MRSA infections. Performed at [facility], [address] [place], [place] [address]   Respiratory Panel by RT PCR (Flu A&B, Covid) - Nasopharyngeal Swab     Status: None   Collection Time: 12/03/19 12:24 PM   Specimen: Nasopharyngeal Swab  Result Value Ref Range Status   SARS Coronavirus 2 by RT PCR NEGATIVE NEGATIVE Final    Comment: (NOTE) SARS-CoV-2 target  nucleic acids are NOT DETECTED. The SARS-CoV-2 RNA is generally detectable in upper respiratoy specimens during the acute phase of infection. The lowest concentration of SARS-CoV-2 viral copies this assay can detect is 131 copies/mL. A negative result does not preclude SARS-Cov-2 infection and should not be used as the sole basis for treatment or other patient management decisions. A negative result may occur with  improper specimen collection/handling, submission of specimen other than nasopharyngeal swab, presence of viral mutation(s) within the areas targeted by this assay, and inadequate number of viral copies (<131 copies/mL). A negative result must be combined with clinical observations, patient history, and epidemiological information. The expected result is Negative. Fact Sheet for Patients:  PinkCheek.be Fact Sheet for Healthcare Providers:  GravelBags.it This test is not yet ap proved or cleared by the [place] FDA and  has been authorized for detection and/or diagnosis of SARS-CoV-2 by FDA under an Emergency Use Authorization (EUA). This EUA will remain  in effect (meaning this test can be used) for the duration of the COVID-19 declaration under Section 564(b)(1) of the Act, 21 U.S.C. section 360bbb-3(b)(1), unless the authorization is terminated or revoked sooner.    Influenza A by PCR NEGATIVE NEGATIVE Final   Influenza B by PCR NEGATIVE NEGATIVE Final    Comment: (NOTE) The Xpert Xpress SARS-CoV-2/FLU/RSV assay is intended as an aid in  the diagnosis of influenza from Nasopharyngeal swab specimens and  should not be used as a sole basis for treatment. Nasal washings and  aspirates are unacceptable for Xpert Xpress SARS-CoV-2/FLU/RSV  testing. Fact Sheet for Patients: PinkCheek.be Fact Sheet for Healthcare Providers: GravelBags.it This test is not  yet approved or cleared by the [place] FDA and  has been authorized for detection and/or diagnosis of SARS-CoV-2 by  FDA under an Emergency Use Authorization (EUA). This EUA will remain  in effect (meaning this test can be used) for the duration of the  Covid-19 declaration under Section 564(b)(1) of the Act, 21  U.S.C. section 360bbb-3(b)(1), unless the authorization is  terminated or revoked. Performed at [facility], [address] [place], [place] [address]      Discharge Instructions:   Discharge Instructions    ([place]) Call MD:  Anytime you have any of the following symptoms: 1) 3 pound weight gain in 24 hours or 5 pounds in 1 week 2) shortness of breath, with or without a dry hacking cough 3) swelling in the hands, feet or stomach 4) if you have to sleep on  extra pillows at night in order to breathe.   Complete by: As directed    Avoid straining   Complete by: As directed    Diet - low sodium heart healthy   Complete by: As directed    Diet Carb Modified   Complete by: As directed    Heart Failure patients record your daily weight using the same scale at the same time of day   Complete by: As directed    Increase activity slowly   Complete by: As directed    STOP any activity that causes chest pain, shortness of breath, dizziness, sweating, or exessive weakness   Complete by: As directed    Schedule appointment   Complete by: As directed    Follow-up with PCP in 1 week.  Follow-up with cardiologist in 2 weeks     Allergies as of 12/10/2019      Reactions   Asa [aspirin]       Medication List    STOP taking these medications   carvedilol 25 MG tablet Commonly known as: COREG     TAKE these medications   albuterol 108 (90 Base) MCG/ACT inhaler Commonly known as: VENTOLIN HFA Inhale 2 puffs into the lungs every 6 (six) hours as needed for wheezing or shortness of breath.   amiodarone 200 MG tablet Commonly known as:  PACERONE Take 1 tablet (200 mg total) by mouth daily. Start taking on: December 11, 2019   amLODipine 5 MG tablet Commonly known as: NORVASC Take 10 mg by mouth daily.   benztropine 2 MG tablet Commonly known as: COGENTIN Take 2 mg by mouth at bedtime as needed.   dextrose 40 % Gel Commonly known as: GLUTOSE Take by mouth once as needed for low blood sugar. For blood sugar <70   diphenhydrAMINE 25 mg capsule Commonly known as: BENADRYL Take 25 mg by mouth at bedtime as needed for sleep.   ezetimibe 10 MG tablet Commonly known as: ZETIA Take 10 mg by mouth daily.   Farxiga 5 MG Tabs tablet Generic drug: dapagliflozin propanediol Take 5 mg by mouth daily.   fluticasone 50 MCG/ACT nasal spray Commonly known as: FLONASE Place 1 spray into both nostrils daily.   guaifenesin 100 MG/5ML syrup Commonly known as: ROBITUSSIN Take 200 mg by mouth every 4 (four) hours as needed for cough.   haloperidol decanoate 100 MG/ML injection Commonly known as: HALDOL DECANOATE Inject 90 mg into the muscle every 28 (twenty-eight) days.   ipratropium-albuterol 0.5-2.5 (3) MG/3ML Soln Commonly known as: DUONEB Take 3 mLs by nebulization 4 (four) times daily.   isosorbide-hydrALAZINE 20-37.5 MG tablet Commonly known as: BIDIL Take 1 tablet by mouth 3 (three) times daily.   ivabradine 5 MG Tabs tablet Commonly known as: CORLANOR Take 1 tablet (5 mg total) by mouth 2 (two) times daily with a meal.   liraglutide 18 MG/3ML Sopn Commonly known as: VICTOZA Inject 1.2 mg into the skin daily.   [place] Magnair Starter Kit 25 MCG/ML Soln Generic drug: Glycopyrrolate Inhale 25 mcg into the lungs 2 (two) times daily.   LORazepam 0.5 MG tablet Commonly known as: ATIVAN Take 0.5 mg by mouth every 4 (four) hours as needed (agitation).   metFORMIN 500 MG tablet Commonly known as: GLUCOPHAGE Take 1,000 mg by mouth 2 (two) times daily with a meal.   metoprolol tartrate 50 MG tablet Commonly  known as: LOPRESSOR Take 1 tablet (50 mg total) by mouth 2 (two) times daily.   morphine CONCENTRATE  10 mg / 0.5 ml concentrated solution Take 10 mg by mouth every 4 (four) hours as needed for severe pain or shortness of breath.   Perforomist 20 MCG/2ML nebulizer solution Generic drug: formoterol Take 20 mcg by nebulization 2 (two) times daily.   potassium chloride SA 20 MEQ tablet Commonly known as: KLOR-CON Take 1 tablet (20 mEq total) by mouth daily.   predniSONE 20 MG tablet Commonly known as: DELTASONE Take 20 mg by mouth daily.   rosuvastatin 40 MG tablet Commonly known as: CRESTOR Take 40 mg by mouth daily.   sacubitril-valsartan 97-103 MG Commonly known as: ENTRESTO Take 1 tablet by mouth 2 (two) times daily.   spironolactone 25 MG tablet Commonly known as: ALDACTONE Take 0.5 tablets (12.5 mg total) by mouth daily. What changed: how much to take   torsemide 20 MG tablet Commonly known as: DEMADEX Take 2 tablets (40 mg total) by mouth daily.   Vitamin D3 125 MCG (5000 UT) Caps Take 5,000 Units by mouth daily.      Follow-up Information    [place] [facility] Follow up on 12/18/2019.   Specialty: Cardiology Why: at 8:30am. Enter through the [place] entrance Contact information: [place] [phone]           Time coordinating discharge: 33 minutes  Signed:  Jennye Boroughs  [facility] 12/10/2019, 12:26 PM

## 2019-12-10 NOTE — Progress Notes (Signed)
[facility] Cardiology [facility] Encounter Note  Patient: Paul Hernandez / Admit Date: 12/03/2019 / Date of Encounter: 12/10/2019, 12:17 PM   Subjective: Patient feels relatively well today no evidence of significant new symptoms of chest pain or shortness of breath.  Patient overall rested comfortably overnight patient's slowly improving from COPD and lower extremity edema and acute on chronic systolic dysfunction heart failure with Lasix and oxygen supplementation.  Patient did have several long run of wide-complex tachycardia and SVT with BBB as well yesterday  But overall improved with additional meds and currently in normal sinus rhythm at 76 bpm today and this is after addition of beta-blocker and amiodaonre .  This may be secondary to cardiomyopathy requiring further medication management.  Patient did not have any symptoms suggesting syncope dizziness Echocardiogram reestablished patient has a severe LV systolic dysfunction with ejection fraction of 20% Review of Systems: Positive for: None Negative for: Vision change, hearing change, syncope, dizziness, nausea, vomiting,diarrhea, bloody stool, stomach pain, cough, congestion, diaphoresis, urinary frequency, urinary pain,skin lesions, skin rashes Others previously listed  Objective: Telemetry: Normal sinus rhythm Physical Exam: Blood pressure (!) 152/110, pulse 76, temperature 98.2 F (36.8 C), temperature source Oral, resp. rate 20, height 6' (1.829 m), weight 112.9 kg, SpO2 99 %. Body mass index is 33.77 kg/m. General: Well developed, well nourished, in no acute distress. Head: Normocephalic, atraumatic, sclera non-icteric, no xanthomas, nares are without discharge. Neck: No apparent masses Lungs: Normal respirations with few wheezes, no rhonchi, no rales , basilar crackles   Heart: Regular rate and rhythm, normal S1 S2, no murmur, no rub, no gallop, PMI is normal size and placement, carotid upstroke normal without bruit, jugular  venous pressure normal Abdomen: Soft, non-tender, non-distended with normoactive bowel sounds. No hepatosplenomegaly. Abdominal aorta is normal size without bruit Extremities: 1-2+ edema, no clubbing, no cyanosis, no ulcers,  Peripheral: 2+ radial, 2+ femoral, 2+ dorsal pedal pulses Neuro: Alert and oriented. Moves all extremities spontaneously. Psych:  Responds to questions appropriately with a normal affect.   Intake/Output Summary (Last 24 hours) at 12/10/2019 1217 Last data filed at 12/10/2019 1104 Gross per 24 hour  Intake 165.29 ml  Output 2850 ml  Net -2684.71 ml    Inpatient Medications:  . amiodarone  200 mg Oral Daily  . chlorhexidine  15 mL Mouth Rinse BID  . Chlorhexidine Gluconate Cloth  6 each Topical Daily  . enoxaparin (LOVENOX) injection  40 mg Subcutaneous Q24H  . folic acid  1 mg Oral Daily  . furosemide  40 mg Intravenous Q12H  . insulin aspart  0-5 Units Subcutaneous QHS  . insulin aspart  0-9 Units Subcutaneous TID WC  . insulin glargine  15 Units Subcutaneous Daily  . ipratropium-albuterol  3 mL Nebulization BID  . mouth rinse  15 mL Mouth Rinse q12n4p  . metoprolol tartrate  50 mg Oral BID  . multivitamin with minerals  1 tablet Oral Daily  . potassium chloride  40 mEq Oral BID  . predniSONE  50 mg Oral Q breakfast  . sodium chloride flush  10-40 mL Intracatheter Q12H  . sodium chloride flush  3 mL Intravenous Q12H  . thiamine  100 mg Oral Daily   Infusions:  . sodium chloride 10 mL/hr at 12/06/19 1700    Labs: Recent Labs    12/09/19 2334 12/10/19 1009  NA 139 142  K 3.4* 3.5  CL 89* 89*  CO2 42* 42*  GLUCOSE 182* 108*  BUN 22* 20  CREATININE 0.91  0.80  CALCIUM 7.7* 8.1*  MG 1.8 2.4   No results for input(s): AST, ALT, ALKPHOS, BILITOT, PROT, ALBUMIN in the last 72 hours. No results for input(s): WBC, NEUTROABS, HGB, HCT, MCV, PLT in the last 72 hours. No results for input(s): CKTOTAL, CKMB, TROPONINI in the last 72 hours. Invalid  input(s): POCBNP No results for input(s): HGBA1C in the last 72 hours.   Weights: Filed Weights   12/08/19 0519 12/09/19 0358 12/10/19 0509  Weight: 105.3 kg 112 kg 112.9 kg     Radiology/Studies:  DG Chest Port 1 View  Result Date: 12/08/2019 CLINICAL DATA:  Hypoxia, CHF, COPD EXAM: PORTABLE CHEST 1 VIEW COMPARISON:  12/06/2019 FINDINGS: The heart size and mediastinal contours are stable. Persistent hazy bibasilar opacities, unchanged. Probable trace bilateral pleural effusions. No pneumothorax. Right-sided PICC line terminates at the level of the distal SVC. IMPRESSION: Stable hazy bibasilar opacities. Electronically Signed   By: Duanne Guess D.O.   On: 12/08/2019 10:07   DG Chest Port 1 View  Result Date: 12/06/2019 CLINICAL DATA:  Pulmonary disease. EXAM: PORTABLE CHEST 1 VIEW COMPARISON:  12/03/2019 FINDINGS: Enlargement of the cardiac silhouette is unchanged. Hazy opacity in the right lung base is unchanged and compatible with a small pleural effusion and airspace opacity. There is mildly increased hazy opacity in the left lung base compared to the prior study as well which may also reflect in part a small pleural effusion. No pneumothorax is identified. A right PICC has been placed in terminates over the mid SVC. IMPRESSION: Unchanged right and new left basilar opacities likely reflecting small pleural effusions and atelectasis or infiltrate. Electronically Signed   By: Sebastian Ache M.D.   On: 12/06/2019 09:50   DG Chest Port 1 View  Result Date: 12/03/2019 CLINICAL DATA:  Shortness of breath EXAM: PORTABLE CHEST 1 VIEW COMPARISON:  07/08/2019 FINDINGS: Cardiomegaly with vascular congestion. Small right pleural effusion. Right basilar atelectasis or infiltrate. Left lung clear. No edema. No acute bony abnormality. IMPRESSION: Cardiomegaly with vascular congestion. Small right effusion with right base atelectasis or infiltrate. Electronically Signed   By: Charlett Nose M.D.   On:  12/03/2019 15:06   ECHOCARDIOGRAM COMPLETE  Result Date: 12/04/2019    ECHOCARDIOGRAM REPORT   Patient Name:   Paul Hernandez Date of Exam: 12/04/2019 Medical Rec #:  [id number]     Height:       72.0 in Accession #:    [id number]    Weight:       239.6 lb Date of Birth:  10-29-1965    BSA:          2.30 m Patient Age:    53 years      BP:           106/91 mmHg Patient Gender: M             HR:           77 bpm. Exam Location:  [facility] Procedure: 2D Echo, Color Doppler, Cardiac Doppler and Intracardiac            Opacification Agent Indications:     I50.31 CHF-Acute Diastolic  History:         Patient has prior history of Echocardiogram examinations. CHF,                  COPD; Risk Factors:Hypertension and Diabetes.  Sonographer:     Humphrey Rolls RDCS (AE) Referring Phys:  [id number] Brien Few NIU Diagnosing Phys: Julien Nordmann MD  IMPRESSIONS  1. Left ventricular ejection fraction, by estimation, is 20 to 25%. Left ventricular ejection fraction by PLAX is 19 %. The left ventricle has severely decreased function. The left ventrical demonstrates global hypokinesis. The left ventricular internal  cavity size was severely dilated. Left ventricular diastolic parameters are consistent with Grade I diastolic dysfunction (impaired relaxation).  2. Right ventricular systolic function is moderately reduced. The right ventricular size is mildly enlarged. There is normal pulmonary artery systolic pressure. FINDINGS  Left Ventricle: Left ventricular ejection fraction, by estimation, is 20 to 25%. Left ventricular ejection fraction by PLAX is 19 % The left ventricle has severely decreased function. The left ventricle demonstrates global hypokinesis. Definity contrast  agent was given IV to delineate the left ventricular endocardial borders. The left ventricular internal cavity size was severely dilated. There is no left ventricular hypertrophy. Left ventricular diastolic parameters are consistent with Grade I diastolic dysfunction (impaired  relaxation). Right Ventricle: The right ventricular size is mildly enlarged. No increase in right ventricular wall thickness. Right ventricular systolic function is moderately reduced. There is normal pulmonary artery systolic pressure. The tricuspid regurgitant velocity is 2.06 m/s, and with an assumed right atrial pressure of 10 mmHg, the estimated right ventricular systolic pressure is 27.0 mmHg. Left Atrium: Left atrial size was normal in size. Right Atrium: Right atrial size was normal in size. Pericardium: There is no evidence of pericardial effusion. Mitral Valve: The mitral valve is normal in structure and function. Normal mobility of the mitral valve leaflets. Mild mitral valve regurgitation. No evidence of mitral valve stenosis. MV peak gradient, 1.9 mmHg. The mean mitral valve gradient is 1.0 mmHg. Tricuspid Valve: The tricuspid valve is normal in structure. Tricuspid valve regurgitation is not demonstrated. No evidence of tricuspid stenosis. Aortic Valve: The aortic valve is normal in structure and function. Aortic valve regurgitation is not visualized. Mild to moderate aortic valve sclerosis/calcification is present, without any evidence of aortic stenosis. Aortic valve mean gradient measures 4.0 mmHg. Aortic valve peak gradient measures 7.7 mmHg. Aortic valve area, by VTI measures 2.01 cm. Pulmonic Valve: The pulmonic valve was normal in structure. Pulmonic valve regurgitation is not visualized. No evidence of pulmonic stenosis. Aorta: The aortic root is normal in size and structure. Venous: The inferior vena cava is normal in size with greater than 50% respiratory variability, suggesting right atrial pressure of 3 mmHg. The inferior vena cava and the hepatic vein show a normal flow pattern. IAS/Shunts: No atrial level shunt detected by color flow Doppler.  LEFT VENTRICLE PLAX 2D LV EF:         Left            Diastology                ventricular     LV e' lateral:   5.00 cm/s                ejection         LV E/e' lateral: 14.0                fraction by                PLAX is 19                % LVIDd:         6.14 cm LVIDs:         5.60 cm LV PW:         1.11 cm LV  IVS:        0.92 cm LVOT diam:     2.20 cm LV SV:         46.00 ml LV SV Index:   15.15 LVOT Area:     3.80 cm  RIGHT VENTRICLE RV Basal diam:  4.20 cm TAPSE (M-mode): 1.6 cm LEFT ATRIUM             Index       RIGHT ATRIUM           Index LA diam:        3.80 cm 1.65 cm/m  RA Area:     24.20 cm LA Vol (A2C):   57.7 ml 25.08 ml/m RA Volume:   78.00 ml  33.90 ml/m LA Vol (A4C):   48.0 ml 20.86 ml/m LA Biplane Vol: 55.7 ml 24.21 ml/m  AORTIC VALVE                   PULMONIC VALVE AV Area (Vmax):    2.73 cm    PV Vmax:       0.86 m/s AV Area (Vmean):   2.13 cm    PV Vmean:      54.200 cm/s AV Area (VTI):     2.01 cm    PV VTI:        0.121 m AV Vmax:           139.00 cm/s PV Peak grad:  3.0 mmHg AV Vmean:          94.700 cm/s PV Mean grad:  1.0 mmHg AV VTI:            0.229 m AV Peak Grad:      7.7 mmHg AV Mean Grad:      4.0 mmHg LVOT Vmax:         99.80 cm/s LVOT Vmean:        53.100 cm/s LVOT VTI:          0.121 m LVOT/AV VTI ratio: 0.53  AORTA Ao Root diam: 3.30 cm MITRAL VALVE                        TRICUSPID VALVE MV Area (PHT): 5.81 cm             TR Peak grad:   17.0 mmHg MV Peak grad:  1.9 mmHg             TR Vmax:        206.00 cm/s MV Mean grad:  1.0 mmHg MV Vmax:       0.69 m/s             SHUNTS MV Vmean:      48.5 cm/s            Systemic VTI:  0.12 m MV Decel Time: 131 msec             Systemic Diam: 2.20 cm MV E velocity: 69.85 cm/s 103 cm/s MV A velocity: 73.00 cm/s 70.3 cm/s MV E/A ratio:  0.96       1.5 Ida Rogue MD Electronically signed by Ida Rogue MD Signature Date/Time: 12/04/2019/6:12:28 PM    Final    [place] EKG SITE RITE  Result Date: 12/05/2019 If Site Rite image not attached, placement could not be confirmed due to current cardiac rhythm.    Assessment and Recommendation  54 y.o. male with known  chronic systolic dysfunction congestive heart failure with acute  on chronic systolic dysfunction congestive heart failure with elevated BNP and pulmonary edema slightly improved with appropriate medication management including intravenous Lasix and no current evidence of myocardial infarction.  Patient does have some wide-complex tachycardia needing further medication management after blood pressure is improved 1.  Continue acute on chronic systolic dysfunction congestive heart failure treatment with Lasix continuing to improve hypoxia and edema slowly now with oral Lasix 2.  No further cardiac diagnostics necessary at this time with echocardiogram showing severe LV dysfunction with ejection fraction of 20% 3.  Treatment of wide-complex tachycardia cardiomyopathy with beta-blocker and increased dose as necessary and now added amiodarone  for heart rate between 60 and 70 bpm at baseline and reduction of episodes of tachycardia after patient has better and more stable blood pressure which appears to be accomplished at this time  May have to use amiodarone although am concerned about COPD with amiodarone use and lung concerns.  They have to have pulmonology give recommendation 4.  Continue treatment of any hypoxia or other lung disease exacerbation as per pulmonology which appears to be improving 5.  Reinstatement of Entresto or other antihypertensives as necessary for cardiomyopathy and if able 6.  Begin ambulation and improvements for possible discharge to facility for continued treatment of above 7 Follow up next week for meds adjustments   Signed, Arnoldo Hooker M.D. FACC

## 2019-12-10 NOTE — NC FL2 (Signed)
[place] LEVEL OF CARE SCREENING TOOL     IDENTIFICATION  Patient Name: Paul Hernandez Birthdate: July 04, 1966 Sex: male Admission Date (Current Location): 12/03/2019  [place] and [place] Number:  Engineering geologist and Address:  [facility], [address], [place], [place] [address]      Provider Number: [id number]  Attending Physician Name and Address:  No att. providers found  Relative Name and Phone Number:       Current Level of Care: Hospital Recommended Level of Care: [facility] Prior Approval Number:    Date Approved/Denied:   PASRR Number:    Discharge Plan: Other (Comment)(Group Home with hospice care)    Current Diagnoses: Patient Active Problem List   Diagnosis Date Noted  . Acute on chronic systolic CHF (congestive heart failure) ([place]) 12/03/2019  . Diabetes mellitus without complication ([place])   . COPD with acute exacerbation ([place]) 12/12/2018  . Acute respiratory failure with hypoxia ([place]) 11/29/2018  . Influenza A 11/29/2018  . Hyperlipidemia, unspecified 11/29/2017  . Hypertension 11/29/2017  . Schizophrenia ([place]) 11/16/2017  . COPD (chronic obstructive pulmonary disease) ([place]) 11/15/2017  . Mental health disorder 11/15/2017  . Cerebral ischemia 11/15/2017  . Cerebral infarct ([place]) 11/15/2017  . Benign essential HTN 11/13/2017  . Diabetes type 2, controlled ([place]) 11/13/2017  . COPD exacerbation ([place]) 09/16/2017    Orientation RESPIRATION BLADDER Height & Weight     Self, Situation, Place  Normal Continent Weight: 112.9 kg Height:  6' (182.9 cm)  BEHAVIORAL SYMPTOMS/MOOD NEUROLOGICAL BOWEL NUTRITION STATUS      Continent Diet  AMBULATORY STATUS COMMUNICATION OF NEEDS Skin   Limited Assist Verbally Normal                       Personal Care Assistance Level of Assistance  Bathing, Dressing Bathing Assistance: Limited assistance   Dressing Assistance: Limited assistance      Functional Limitations Info             SPECIAL CARE FACTORS FREQUENCY                       Contractures Contractures Info: Not present    Additional Factors Info                  Current Medications (12/10/2019):  This is the current hospital active medication list Current Facility-Administered Medications  Medication Dose Route Frequency Provider Last Rate Last Admin  . 0.9 %  sodium chloride infusion   Intravenous PRN Ralene Muskrat B, MD 10 mL/hr at 12/06/19 1700 Rate Verify at 12/06/19 1700  . albuterol (PROVENTIL) (2.5 MG/3ML) 0.083% nebulizer solution 2.5 mg  2.5 mg Nebulization Q4H PRN Charlett Nose, RPH   2.5 mg at 12/03/19 1704  . amiodarone (PACERONE) tablet 200 mg  200 mg Oral Daily Corey Skains, MD   200 mg at 12/10/19 0940  . benztropine (COGENTIN) tablet 2 mg  2 mg Oral QHS PRN Ivor Costa, MD   2 mg at 12/03/19 2230  . chlorhexidine (PERIDEX) 0.12 % solution 15 mL  15 mL Mouth Rinse BID Ivor Costa, MD   15 mL at 12/09/19 2228  . Chlorhexidine Gluconate Cloth 2 % PADS 6 each  6 each Topical Daily Ivor Costa, MD   6 each at 12/10/19 0940  . diphenhydrAMINE (BENADRYL) capsule 25 mg  25 mg Oral QHS PRN Ivor Costa, MD      .  enoxaparin (LOVENOX) injection 40 mg  40 mg Subcutaneous Q24H Ivor Costa, MD   40 mg at 12/09/19 2228  . folic acid (FOLVITE) tablet 1 mg  1 mg Oral Daily Ottie Glazier, MD   1 mg at 12/10/19 0939  . furosemide (LASIX) injection 40 mg  40 mg Intravenous Q12H Ralene Muskrat B, MD   40 mg at 12/10/19 1233  . insulin aspart (novoLOG) injection 0-5 Units  0-5 Units Subcutaneous QHS Ivor Costa, MD   2 Units at 12/08/19 2232  . insulin aspart (novoLOG) injection 0-9 Units  0-9 Units Subcutaneous TID WC Ivor Costa, MD   1 Units at 12/10/19 1233  . insulin glargine (LANTUS) injection 15 Units  15 Units Subcutaneous Daily Ottie Glazier, MD   15 Units at 12/10/19 0940  . ipratropium-albuterol (DUONEB) 0.5-2.5 (3) MG/3ML  nebulizer solution 3 mL  3 mL Nebulization BID Ralene Muskrat B, MD   3 mL at 12/10/19 0755  . LORazepam (ATIVAN) tablet 0.5 mg  0.5 mg Oral Q4H PRN Ivor Costa, MD      . MEDLINE mouth rinse  15 mL Mouth Rinse q12n4p Ivor Costa, MD   15 mL at 12/10/19 1233  . metoprolol tartrate (LOPRESSOR) tablet 50 mg  50 mg Oral BID Corey Skains, MD   50 mg at 12/10/19 1505  . multivitamin with minerals tablet 1 tablet  1 tablet Oral Daily Ottie Glazier, MD   1 tablet at 12/10/19 0939  . potassium chloride SA (KLOR-CON) CR tablet 40 mEq  40 mEq Oral BID Oswald Hillock, RPH   40 mEq at 12/10/19 6979  . predniSONE (DELTASONE) tablet 50 mg  50 mg Oral Q breakfast Ralene Muskrat B, MD   50 mg at 12/10/19 0939  . sodium chloride flush (NS) 0.9 % injection 10-40 mL  10-40 mL Intracatheter Q12H Sreenath, Sudheer B, MD   10 mL at 12/09/19 2231  . sodium chloride flush (NS) 0.9 % injection 10-40 mL  10-40 mL Intracatheter PRN Sreenath, Sudheer B, MD      . sodium chloride flush (NS) 0.9 % injection 3 mL  3 mL Intravenous Q12H Ralene Muskrat B, MD   3 mL at 12/10/19 0941  . thiamine tablet 100 mg  100 mg Oral Daily Ottie Glazier, MD   100 mg at 12/10/19 0940   Current Outpatient Medications  Medication Sig Dispense Refill  . albuterol (VENTOLIN HFA) 108 (90 Base) MCG/ACT inhaler Inhale 2 puffs into the lungs every 6 (six) hours as needed for wheezing or shortness of breath. 8 g 0  . amLODipine (NORVASC) 5 MG tablet Take 10 mg by mouth daily.    . benztropine (COGENTIN) 2 MG tablet Take 2 mg by mouth at bedtime as needed.     . Cholecalciferol (VITAMIN D3) 5000 units CAPS Take 5,000 Units by mouth daily.    . dapagliflozin propanediol (FARXIGA) 5 MG TABS tablet Take 5 mg by mouth daily.    Marland Kitchen dextrose (GLUTOSE) 40 % GEL Take by mouth once as needed for low blood sugar. For blood sugar <70    . diphenhydrAMINE (BENADRYL) 25 mg capsule Take 25 mg by mouth at bedtime as needed for sleep.     Marland Kitchen ezetimibe  (ZETIA) 10 MG tablet Take 10 mg by mouth daily.    . fluticasone (FLONASE) 50 MCG/ACT nasal spray Place 1 spray into both nostrils daily.    . formoterol (PERFOROMIST) 20 MCG/2ML nebulizer solution Take 20 mcg by nebulization 2 (  two) times daily.    . Glycopyrrolate (LONHALA MAGNAIR STARTER KIT) 25 MCG/ML SOLN Inhale 25 mcg into the lungs 2 (two) times daily.    Marland Kitchen guaifenesin (ROBITUSSIN) 100 MG/5ML syrup Take 200 mg by mouth every 4 (four) hours as needed for cough.    . haloperidol decanoate (HALDOL DECANOATE) 100 MG/ML injection Inject 90 mg into the muscle every 28 (twenty-eight) days.     Marland Kitchen ipratropium-albuterol (DUONEB) 0.5-2.5 (3) MG/3ML SOLN Take 3 mLs by nebulization 4 (four) times daily.    . isosorbide-hydrALAZINE (BIDIL) 20-37.5 MG tablet Take 1 tablet by mouth 3 (three) times daily.    . ivabradine (CORLANOR) 5 MG TABS tablet Take 1 tablet (5 mg total) by mouth 2 (two) times daily with a meal. 60 tablet 3  . liraglutide (VICTOZA) 18 MG/3ML SOPN Inject 1.2 mg into the skin daily.    Marland Kitchen LORazepam (ATIVAN) 0.5 MG tablet Take 0.5 mg by mouth every 4 (four) hours as needed (agitation).    . metFORMIN (GLUCOPHAGE) 500 MG tablet Take 1,000 mg by mouth 2 (two) times daily with a meal.    . Morphine Sulfate (MORPHINE CONCENTRATE) 10 mg / 0.5 ml concentrated solution Take 10 mg by mouth every 4 (four) hours as needed for severe pain or shortness of breath.    . predniSONE (DELTASONE) 20 MG tablet Take 20 mg by mouth daily.    . rosuvastatin (CRESTOR) 40 MG tablet Take 40 mg by mouth daily.    . sacubitril-valsartan (ENTRESTO) 97-103 MG Take 1 tablet by mouth 2 (two) times daily. 60 tablet 5  . spironolactone (ALDACTONE) 25 MG tablet Take 0.5 tablets (12.5 mg total) by mouth daily. (Patient taking differently: Take 25 mg by mouth daily. ) 15 tablet 3  . torsemide (DEMADEX) 20 MG tablet Take 2 tablets (40 mg total) by mouth daily. 180 tablet 3  . [START ON 12/11/2019] amiodarone (PACERONE) 200 MG  tablet Take 1 tablet (200 mg total) by mouth daily. 30 tablet 0  . metoprolol tartrate (LOPRESSOR) 50 MG tablet Take 1 tablet (50 mg total) by mouth 2 (two) times daily. 60 tablet 0  . potassium chloride SA (KLOR-CON) 20 MEQ tablet Take 1 tablet (20 mEq total) by mouth daily. 5 tablet 0     Discharge Medications: Please see discharge summary for a list of discharge medications.  Relevant Imaging Results:  Relevant Lab Results:   Additional Information SS# [id number]  Victorino Dike, RN

## 2019-12-10 NOTE — Consult Note (Addendum)
PHARMACY CONSULT NOTE - FOLLOW UP  Pharmacy Consult for Electrolyte Monitoring and Replacement   Recent Labs: Potassium (mmol/L)  Date Value  12/09/2019 3.4 (L)   Magnesium (mg/dL)  Date Value  41/12/129 1.8   Calcium (mg/dL)  Date Value  43/88/8757 7.7 (L)   Albumin (g/dL)  Date Value  97/28/2060 3.2 (L)   Phosphorus (mg/dL)  Date Value  15/61/5379 5.2 (H)   Sodium (mmol/L)  Date Value  12/09/2019 139     Assessment: 53 y.o.malewith medical history significant ofsCHF with EF 20-25%,hypertension, diabetes mellitus, COPD, schizophrenia, who presents with shortness of breath. Pt is on lasix 40 mg q12H. Labs were drawn 2/16 @ 2334.   Goal of Therapy:  Potassium > 4 and Mg > 2  Plan:  Will replace potassium with KCl 40 mEq x 2 and Mg 2 g IV x 1. May need to start on scheduled potassium.   Ronnald Ramp ,PharmD, BCPS Clinical Pharmacist 12/10/2019 8:28 AM

## 2019-12-10 NOTE — Plan of Care (Signed)
  Problem: Education: Goal: Knowledge of General Education information will improve Description: Including pain rating scale, medication(s)/side effects and non-pharmacologic comfort measures 12/10/2019 1332 by Blenda Bridegroom, RN Outcome: Completed/Met 12/10/2019 0843 by Blenda Bridegroom, RN Outcome: Progressing   Problem: Health Behavior/Discharge Planning: Goal: Ability to manage health-related needs will improve 12/10/2019 1332 by Blenda Bridegroom, RN Outcome: Completed/Met 12/10/2019 0843 by Blenda Bridegroom, RN Outcome: Progressing   Problem: Clinical Measurements: Goal: Ability to maintain clinical measurements within normal limits will improve 12/10/2019 1332 by Blenda Bridegroom, RN Outcome: Completed/Met 12/10/2019 0843 by Blenda Bridegroom, RN Outcome: Progressing Goal: Will remain free from infection 12/10/2019 1332 by Blenda Bridegroom, RN Outcome: Completed/Met 12/10/2019 0843 by Blenda Bridegroom, RN Outcome: Progressing Goal: Diagnostic test results will improve 12/10/2019 1332 by Blenda Bridegroom, RN Outcome: Completed/Met 12/10/2019 0843 by Blenda Bridegroom, RN Outcome: Progressing Goal: Respiratory complications will improve 12/10/2019 1332 by Blenda Bridegroom, RN Outcome: Completed/Met 12/10/2019 0843 by Blenda Bridegroom, RN Outcome: Progressing Goal: Cardiovascular complication will be avoided 12/10/2019 1332 by Blenda Bridegroom, RN Outcome: Completed/Met 12/10/2019 0843 by Blenda Bridegroom, RN Outcome: Progressing   Problem: Activity: Goal: Risk for activity intolerance will decrease 12/10/2019 1332 by Blenda Bridegroom, RN Outcome: Completed/Met 12/10/2019 0843 by Blenda Bridegroom, RN Outcome: Progressing   Problem: Nutrition: Goal: Adequate nutrition will be maintained 12/10/2019 1332 by Blenda Bridegroom, RN Outcome: Completed/Met 12/10/2019 0843 by Blenda Bridegroom, RN Outcome: Progressing   Problem: Coping: Goal: Level of anxiety will decrease 12/10/2019 1332 by Blenda Bridegroom, RN Outcome:  Completed/Met 12/10/2019 0843 by Blenda Bridegroom, RN Outcome: Progressing   Problem: Elimination: Goal: Will not experience complications related to bowel motility 12/10/2019 1332 by Blenda Bridegroom, RN Outcome: Completed/Met 12/10/2019 0843 by Blenda Bridegroom, RN Outcome: Progressing Goal: Will not experience complications related to urinary retention 12/10/2019 1332 by Blenda Bridegroom, RN Outcome: Completed/Met 12/10/2019 0843 by Blenda Bridegroom, RN Outcome: Progressing   Problem: Pain Managment: Goal: General experience of comfort will improve 12/10/2019 1332 by Blenda Bridegroom, RN Outcome: Completed/Met 12/10/2019 0843 by Blenda Bridegroom, RN Outcome: Progressing   Problem: Safety: Goal: Ability to remain free from injury will improve 12/10/2019 1332 by Blenda Bridegroom, RN Outcome: Completed/Met 12/10/2019 0843 by Blenda Bridegroom, RN Outcome: Progressing   Problem: Skin Integrity: Goal: Risk for impaired skin integrity will decrease 12/10/2019 1332 by Blenda Bridegroom, RN Outcome: Completed/Met 12/10/2019 0843 by Blenda Bridegroom, RN Outcome: Progressing

## 2019-12-10 NOTE — TOC Initial Note (Signed)
Transition of Care [facility]) - Initial/Assessment Note    Patient Details  Name: Paul Hernandez MRN: [id number] Date of Birth: 06/04/1966  [facility] CM/SW Contact:    Shawn Route, RN Phone Number: 12/10/2019, 12:53 PM  Clinical Narrative:                 Spoke with transport for Group Home and was told they are unable to pick up patient today.  I told him we didn't feel like it was safe to discharge in a taxi.  Driver stated he would call facility and see what they could work out and call me back.   Expected Discharge Plan: Home w Hospice Care Barriers to Discharge: Continued Medical Work up   Patient Goals and CMS Choice        Expected Discharge Plan and Services Expected Discharge Plan: Home w Hospice Care   Discharge Planning Services: CM Consult Post Acute Care Choice: Hospice Living arrangements for the past 2 months: Group Home Expected Discharge Date: 12/10/19                           [facility] Agency: [facility](Hospice) Date HH Agency Contacted: 12/08/19 Time HH Agency Contacted: 0914    Prior Living Arrangements/Services Living arrangements for the past 2 months: Group Home Lives with:: Facility Resident Patient language and need for interpreter reviewed:: Yes Do you feel safe going back to the place where you live?: Yes      Need for Family Participation in Patient Care: No (Comment) Care giver support system in place?: Yes (comment) Current home services: DME, Hospice((oxygen) ([facility])) Criminal Activity/Legal Involvement Pertinent to Current Situation/Hospitalization: No - Comment as needed  Activities of Daily Living Home Assistive Devices/Equipment: Cane (specify quad or straight), Walker (specify type) ADL Screening (condition at time of admission) Patient's cognitive ability adequate to safely complete daily activities?: Yes Is the patient deaf or have difficulty hearing?: No Does the patient have  difficulty seeing, even when wearing glasses/contacts?: No Does the patient have difficulty concentrating, remembering, or making decisions?: No Patient able to express need for assistance with ADLs?: Yes Does the patient have difficulty dressing or bathing?: No Independently performs ADLs?: Yes (appropriate for developmental age) Does the patient have difficulty walking or climbing stairs?: No Weakness of Legs: None Weakness of Arms/Hands: None  Permission Sought/Granted                  Emotional Assessment Appearance:: Appears stated age     Orientation: : Oriented to Self, Oriented to Situation, Oriented to Place Alcohol / Substance Use: Not Applicable Psych Involvement: No (comment)  Admission diagnosis:  Acute on chronic systolic CHF (congestive heart failure) (HCC) [I50.23] Patient Active Problem List   Diagnosis Date Noted  . Acute on chronic systolic CHF (congestive heart failure) (HCC) 12/03/2019  . Diabetes mellitus without complication (HCC)   . COPD with acute exacerbation (HCC) 12/12/2018  . Acute respiratory failure with hypoxia (HCC) 11/29/2018  . Influenza A 11/29/2018  . Hyperlipidemia, unspecified 11/29/2017  . Hypertension 11/29/2017  . Schizophrenia (HCC) 11/16/2017  . COPD (chronic obstructive pulmonary disease) (HCC) 11/15/2017  . Mental health disorder 11/15/2017  . Cerebral ischemia 11/15/2017  . Cerebral infarct (HCC) 11/15/2017  . Benign essential HTN 11/13/2017  . Diabetes type 2, controlled (HCC) 11/13/2017  . COPD exacerbation (HCC) 09/16/2017   PCP:  Armando Gang, FNP Pharmacy:   [facility] [place], [place] [place] [address] Phone: [phone] Fax: [phone]     Social Determinants of Health (SDOH) Interventions    Readmission Risk Interventions No flowsheet data found.

## 2019-12-11 ENCOUNTER — Ambulatory Visit: Payer: Medicare Other | Admitting: Family

## 2019-12-12 ENCOUNTER — Telehealth: Payer: Self-pay | Admitting: Family

## 2019-12-12 NOTE — Telephone Encounter (Signed)
Spoke with [place] and he states patients breathing very well today but his overall health has declined. They Confirmed his follow up CHF appointment with [place] for 2/25. He is following a low sodium diet, taking daily weights, and meds as he is suppose too.   Deetta Perla, [place]

## 2019-12-17 NOTE — Progress Notes (Signed)
Hernandez ID: Paul Hernandez, male    DOB: 1966/05/21, 54 y.o.   MRN: [id number]  HPI  Paul Hernandez is a 54 y/o male with a history of HTN, DM, COPD, schizophrenia, current tobacco use and chronic heart failure.   Echo report from 12/04/19 reviewed and showed an EF of 20-25%. Echo report from 11/30/2018 reviewed and showed an EF of 20-25%.  Admitted 12/03/19 due to acute on chronic heart failure. Cardiology consult obtained. Initially needed vasopressors, IV steroids and midodrine in Paul ICU. Given IV lasix, bipap and nebulizers. Had NSVT and amiodarone was added. Discharged after 7 days. Was in [facility] ED 07/08/2019 due to COPD/HF exacerbation. Prescribed lasix and prednisone and released.   Paul Hernandez presents today for a follow-up visit with a chief complaint of minimal shortness of breath upon moderate exertion. Paul Hernandez describes this as chronic in nature having been present for several years and has improved much since his recent admission. Paul Hernandez does notice associated wheezing along with this. Paul Hernandez denies any difficulty sleeping, dizziness, abdominal distention, palpitations, pedal edema, chest pain, cough, fatigue or weight gain.  Says that Paul Hernandez's getting weighed daily and that prior to his admission, Paul Hernandez didn't notice swelling, weight gain or shortness of breath. Went to PCP and was sent directly to [facility] hospital.    Past Medical History:  Diagnosis Date  . CHF (congestive heart failure) ([place]) 12/05/2019  . COPD (chronic obstructive pulmonary disease) ([place]) 12/05/2019  . Diabetes mellitus without complication ([place]) 04/15/7627  . Hypertension 12/05/2019  . Mental health disorder 12/05/2019   Past Surgical History:  Procedure Laterality Date  . NO PAST SURGERIES     Family History  Problem Relation Age of Onset  . CVA Mother   . CAD Father    Social History   Tobacco Use  . Smoking status: Former Smoker    Packs/day: 1.00    Years: 35.00    Pack years: 35.00    Types: Cigarettes    Start date: 09/07/2019   . Smokeless tobacco: Never Used  Substance Use Topics  . Alcohol use: No   Allergies  Allergen Reactions  . Asa [Aspirin]    Prior to Admission medications   Medication Sig Start Date End Date Taking? Authorizing Provider  albuterol (VENTOLIN HFA) 108 (90 Base) MCG/ACT inhaler Inhale 2 puffs into Paul lungs every 6 (six) hours as needed for wheezing or shortness of breath. 07/08/19  Yes Blake Divine, MD  amiodarone (PACERONE) 200 MG tablet Take 1 tablet (200 mg total) by mouth daily. 12/11/19  Yes Jennye Boroughs, MD  amLODipine (NORVASC) 5 MG tablet Take 10 mg by mouth daily.   Yes [provider]  Cholecalciferol (VITAMIN D3) 5000 units CAPS Take 5,000 Units by mouth daily.   Yes [provider]  dapagliflozin propanediol (FARXIGA) 5 MG TABS tablet Take 5 mg by mouth daily.   Yes [provider]  dextrose (GLUTOSE) 40 % GEL Take by mouth once as needed for low blood sugar. For blood sugar <70   Yes [provider]  ezetimibe (ZETIA) 10 MG tablet Take 10 mg by mouth daily.   Yes [provider]  fluticasone (FLONASE) 50 MCG/ACT nasal spray Place 1 spray into both nostrils daily.   Yes [provider]  formoterol (PERFOROMIST) 20 MCG/2ML nebulizer solution Take 20 mcg by nebulization 2 (two) times daily.   Yes [provider]  Glycopyrrolate (LONHALA MAGNAIR STARTER KIT) 25 MCG/ML SOLN Inhale 25 mcg into Paul lungs 2 (two)  times daily.   Yes [provider]  haloperidol decanoate (HALDOL DECANOATE) 100 MG/ML injection Inject 90 mg into Paul muscle every 28 (twenty-eight) days.    Yes [provider]  ipratropium-albuterol (DUONEB) 0.5-2.5 (3) MG/3ML SOLN Take 3 mLs by nebulization 4 (four) times daily.   Yes [provider]  isosorbide-hydrALAZINE (BIDIL) 20-37.5 MG tablet Take 1 tablet by mouth 3 (three) times daily.   Yes [provider]  ivabradine (CORLANOR) 5 MG TABS tablet Take 1 tablet (5  mg total) by mouth 2 (two) times daily with a meal. 08/11/19  Yes Darylene Price A, FNP  metFORMIN (GLUCOPHAGE) 500 MG tablet Take 1,000 mg by mouth 2 (two) times daily with a meal.   Yes [provider]  metoprolol tartrate (LOPRESSOR) 50 MG tablet Take 1 tablet (50 mg total) by mouth 2 (two) times daily. 12/10/19  Yes Jennye Boroughs, MD  potassium chloride SA (KLOR-CON) 20 MEQ tablet Take 1 tablet (20 mEq total) by mouth daily. Hernandez taking differently: Take 40 mEq by mouth daily.  12/10/19  Yes Jennye Boroughs, MD  predniSONE (DELTASONE) 20 MG tablet Take 20 mg by mouth daily.   Yes [provider]  rosuvastatin (CRESTOR) 40 MG tablet Take 40 mg by mouth daily.   Yes [provider]  sacubitril-valsartan (ENTRESTO) 97-103 MG Take 1 tablet by mouth 2 (two) times daily. 08/04/19  Yes , [name], FNP  spironolactone (ALDACTONE) 25 MG tablet Take 0.5 tablets (12.5 mg total) by mouth daily. 08/28/19 12/18/19 Yes , [name], FNP  torsemide (DEMADEX) 20 MG tablet Take 2 tablets (40 mg total) by mouth daily. 08/06/19 12/18/19 Yes , [name], FNP  LORazepam (ATIVAN) 0.5 MG tablet Take 0.5 mg by mouth every 4 (four) hours as needed (agitation).    [provider]     Review of Systems  Constitutional: Negative for appetite change, fatigue and fever.  HENT: Negative for congestion, rhinorrhea and sore throat.   Eyes: Negative.   Respiratory: Positive for shortness of breath (with moderate exertion) and wheezing. Negative for cough and chest tightness.   Cardiovascular: Negative for chest pain, palpitations and leg swelling.  Gastrointestinal: Negative for abdominal distention and abdominal pain.  Endocrine: Negative.   Musculoskeletal: Negative for arthralgias and back pain.  Skin: Negative for rash and wound.  Allergic/Immunologic: Negative.   Neurological: Negative for dizziness and light-headedness.  Hematological: Negative for adenopathy. Does not  bruise/bleed easily.  Psychiatric/Behavioral: Negative for dysphoric mood and sleep disturbance (sleeps with 2 pillows). Paul Hernandez is not nervous/anxious.    Vitals:   12/18/19 0835  BP: 103/72  Pulse: 76  Resp: 18  SpO2: 99%  Weight: 219 lb 12.8 oz (99.7 kg)  Height: 6' (1.829 m)   Wt Readings from Last 3 Encounters:  12/18/19 219 lb 12.8 oz (99.7 kg)  12/10/19 249 lb (112.9 kg)  09/26/19 225 lb 12.8 oz (102.4 kg)   Lab Results  Component Value Date   CREATININE 0.80 12/10/2019   CREATININE 0.91 12/09/2019   CREATININE 0.76 12/08/2019    Physical Exam Constitutional:      Appearance: Normal appearance.  HENT:     Head: Normocephalic and atraumatic.  Cardiovascular:     Rate and Rhythm: Normal rate and regular rhythm.  Pulmonary:     Effort: Pulmonary effort is normal.     Breath sounds: Wheezing (LUL) present. No rhonchi or rales.  Abdominal:     General: There is no distension.  Palpations: Abdomen is soft.  Musculoskeletal:        General: No tenderness.     Cervical back: Normal range of motion and neck supple.     Right lower leg: No edema.     Left lower leg: No edema.  Skin:    General: Skin is warm and dry.  Neurological:     General: No focal deficit present.     Mental Status: Paul Hernandez is alert and oriented to person, place, and time.  Psychiatric:        Mood and Affect: Mood normal.        Behavior: Behavior normal.    Assessment & Plan:  1: Chronic heart failure with reduced ejection fraction- - NYHA class II - euvolemic today - weighing daily and reminded to call for an overnight weight gain of >2 pounds or a weekly weight gain of >5 pounds - weight down 6 pounds from last visit here 2 months ago - not adding salt to his food - drinking ~ 1L of tea and 1 bottle of water daily - saw cardiology Clayborn Bigness) 05/01/18 - BNP 12/08/19 was 2195.0 - has hospice involved and Hernandez says that they come every "1-2 weeks"  2: HTN- - BP looks good  although on Paul low side; no room to titrate entresto - [facility] PCP ([facility] prior to recent admission - BMP on 12/10/19 reviewed and showed sodium 142, potassium 3.5, creatinine 0.8 and GFR >60  3: DM- - A1c 11/29/2018 was 8.3%  4: Schizophrenia- - living at a group home called [facility] - [facility]   5: COPD- - saw pulmonology Ashby Dawes) 11/29/17 - smokes 3-4 cigarettes daily - complete cessation discussed for 3 minutes with him    Facility medication list was reviewed.  Return in 2 months or sooner for any questions/problems before then.

## 2019-12-18 ENCOUNTER — Encounter: Payer: Self-pay | Admitting: Family

## 2019-12-18 ENCOUNTER — Other Ambulatory Visit: Payer: Self-pay

## 2019-12-18 ENCOUNTER — Ambulatory Visit: Attending: Family | Admitting: Family

## 2019-12-18 VITALS — BP 103/72 | HR 76 | Resp 18 | Ht 72.0 in | Wt 219.8 lb

## 2019-12-18 DIAGNOSIS — F17210 Nicotine dependence, cigarettes, uncomplicated: Secondary | ICD-10-CM | POA: Insufficient documentation

## 2019-12-18 DIAGNOSIS — Z823 Family history of stroke: Secondary | ICD-10-CM | POA: Diagnosis not present

## 2019-12-18 DIAGNOSIS — Z7984 Long term (current) use of oral hypoglycemic drugs: Secondary | ICD-10-CM | POA: Insufficient documentation

## 2019-12-18 DIAGNOSIS — Z79899 Other long term (current) drug therapy: Secondary | ICD-10-CM | POA: Insufficient documentation

## 2019-12-18 DIAGNOSIS — F209 Schizophrenia, unspecified: Secondary | ICD-10-CM | POA: Insufficient documentation

## 2019-12-18 DIAGNOSIS — I5022 Chronic systolic (congestive) heart failure: Secondary | ICD-10-CM | POA: Diagnosis not present

## 2019-12-18 DIAGNOSIS — I110 Hypertensive heart disease with heart failure: Secondary | ICD-10-CM | POA: Diagnosis not present

## 2019-12-18 DIAGNOSIS — F203 Undifferentiated schizophrenia: Secondary | ICD-10-CM

## 2019-12-18 DIAGNOSIS — J42 Unspecified chronic bronchitis: Secondary | ICD-10-CM

## 2019-12-18 DIAGNOSIS — Z7952 Long term (current) use of systemic steroids: Secondary | ICD-10-CM | POA: Insufficient documentation

## 2019-12-18 DIAGNOSIS — J449 Chronic obstructive pulmonary disease, unspecified: Secondary | ICD-10-CM | POA: Insufficient documentation

## 2019-12-18 DIAGNOSIS — Z886 Allergy status to analgesic agent status: Secondary | ICD-10-CM | POA: Insufficient documentation

## 2019-12-18 DIAGNOSIS — I509 Heart failure, unspecified: Secondary | ICD-10-CM | POA: Diagnosis present

## 2019-12-18 DIAGNOSIS — Z8249 Family history of ischemic heart disease and other diseases of the circulatory system: Secondary | ICD-10-CM | POA: Diagnosis not present

## 2019-12-18 DIAGNOSIS — I10 Essential (primary) hypertension: Secondary | ICD-10-CM

## 2019-12-18 DIAGNOSIS — E119 Type 2 diabetes mellitus without complications: Secondary | ICD-10-CM | POA: Diagnosis not present

## 2019-12-18 NOTE — Patient Instructions (Signed)
Continue weighing daily and call for an overnight weight gain of > 2 pounds or a weekly weight gain of >5 pounds. 

## 2020-02-16 NOTE — Progress Notes (Signed)
Patient ID: Paul Hernandez, male    DOB: 1966-07-21, 54 y.o.   MRN: [id number]  HPI  Mr Carras is a 54 y/o male with a history of HTN, DM, COPD, schizophrenia, current tobacco use and chronic heart failure.   Echo report from 12/04/19 reviewed and showed an EF of 20-25%. Echo report from 11/30/2018 reviewed and showed an EF of 20-25%.  Admitted 12/03/19 due to acute on chronic heart failure. Cardiology consult obtained. Initially needed vasopressors, IV steroids and midodrine in the ICU. Given IV lasix, bipap and nebulizers. Had NSVT and amiodarone was added. Discharged after 7 days.   He presents today for with a chief complaint of a follow-up visit. He currently denies any symptoms and specifically denies any difficulty sleeping, dizziness, abdominal distention, palpitations, pedal edema, chest pain, shortness of breath, cough, fatigue or weight gain.   Walks to the "smoke room" at the facility a few times a day but that's all the exercise he gets. Says that he's received both of his COVID vaccines.   Past Medical History:  Diagnosis Date  . CHF (congestive heart failure) ([place]) 12/05/2019  . COPD (chronic obstructive pulmonary disease) ([place]) 12/05/2019  . Diabetes mellitus without complication ([place]) 62/56/3893  . Hypertension 12/05/2019  . Mental health disorder 12/05/2019   Past Surgical History:  Procedure Laterality Date  . NO PAST SURGERIES     Family History  Problem Relation Age of Onset  . CVA Mother   . CAD Father    Social History   Tobacco Use  . Smoking status: Former Smoker    Packs/day: 1.00    Years: 35.00    Pack years: 35.00    Types: Cigarettes    Start date: 09/07/2019  . Smokeless tobacco: Never Used  Substance Use Topics  . Alcohol use: No   Allergies  Allergen Reactions  . Asa [Aspirin]    Prior to Admission medications   Medication Sig Start Date End Date Taking? Authorizing Provider  albuterol (VENTOLIN HFA) 108 (90 Base) MCG/ACT inhaler Inhale  2 puffs into the lungs every 6 (six) hours as needed for wheezing or shortness of breath. 07/08/19  Yes Blake Divine, MD  amiodarone (PACERONE) 200 MG tablet Take 1 tablet (200 mg total) by mouth daily. 12/11/19  Yes Jennye Boroughs, MD  amLODipine (NORVASC) 5 MG tablet Take 10 mg by mouth daily.   Yes [provider]  Cholecalciferol (VITAMIN D3) 5000 units CAPS Take 5,000 Units by mouth daily.   Yes [provider]  dapagliflozin propanediol (FARXIGA) 5 MG TABS tablet Take 5 mg by mouth daily.   Yes [provider]  dextrose (GLUTOSE) 40 % GEL Take by mouth once as needed for low blood sugar. For blood sugar <70   Yes [provider]  ezetimibe (ZETIA) 10 MG tablet Take 10 mg by mouth daily.   Yes [provider]  fluticasone (FLONASE) 50 MCG/ACT nasal spray Place 1 spray into both nostrils daily.   Yes [provider]  formoterol (PERFOROMIST) 20 MCG/2ML nebulizer solution Take 20 mcg by nebulization 2 (two) times daily.   Yes [provider]  Glycopyrrolate (LONHALA MAGNAIR STARTER KIT) 25 MCG/ML SOLN Inhale 25 mcg into the lungs 2 (two) times daily.   Yes [provider]  haloperidol decanoate (HALDOL DECANOATE) 100 MG/ML injection Inject 90 mg into the muscle every 28 (twenty-eight) days.    Yes [provider]  ipratropium-albuterol (DUONEB) 0.5-2.5 (3) MG/3ML SOLN Take 3 mLs by nebulization  4 (four) times daily.   Yes [provider]  isosorbide-hydrALAZINE (BIDIL) 20-37.5 MG tablet Take 1 tablet by mouth 3 (three) times daily.   Yes [provider]  ivabradine (CORLANOR) 5 MG TABS tablet Take 1 tablet (5 mg total) by mouth 2 (two) times daily with a meal. 08/11/19  Yes Andreus Cure A, FNP  liraglutide (VICTOZA) 18 MG/3ML SOPN Inject 1.2 mg into the skin daily.   Yes [provider]  LORazepam (ATIVAN) 0.5 MG tablet Take 0.5 mg by mouth every 4 (four) hours as needed (agitation).   Yes  [provider]  metFORMIN (GLUCOPHAGE) 500 MG tablet Take 1,000 mg by mouth 2 (two) times daily with a meal.   Yes [provider]  metoprolol tartrate (LOPRESSOR) 50 MG tablet Take 1 tablet (50 mg total) by mouth 2 (two) times daily. 12/10/19  Yes Jennye Boroughs, MD  potassium chloride SA (KLOR-CON) 20 MEQ tablet Take 1 tablet (20 mEq total) by mouth daily. 12/10/19  Yes Jennye Boroughs, MD  predniSONE (DELTASONE) 20 MG tablet Take 20 mg by mouth daily.   Yes [provider]  rosuvastatin (CRESTOR) 40 MG tablet Take 40 mg by mouth daily.   Yes [provider]  sacubitril-valsartan (ENTRESTO) 97-103 MG Take 1 tablet by mouth 2 (two) times daily. 08/04/19  Yes Marquesha Robideau, [name], FNP  spironolactone (ALDACTONE) 25 MG tablet Take 0.5 tablets (12.5 mg total) by mouth daily. 08/28/19 02/17/20 Yes Maiko Salais, [name], FNP  torsemide (DEMADEX) 20 MG tablet Take 2 tablets (40 mg total) by mouth daily. 08/06/19 02/17/20 Yes [name], FNP    Review of Systems  Constitutional: Negative for appetite change, fatigue and fever.  HENT: Negative for congestion, rhinorrhea and sore throat.   Eyes: Negative.   Respiratory: Negative for cough, chest tightness, shortness of breath and wheezing.   Cardiovascular: Negative for chest pain, palpitations and leg swelling.  Gastrointestinal: Negative for abdominal distention and abdominal pain.  Endocrine: Negative.   Musculoskeletal: Negative for arthralgias and back pain.  Skin: Negative for rash and wound.  Allergic/Immunologic: Negative.   Neurological: Negative for dizziness and light-headedness.  Hematological: Negative for adenopathy. Does not bruise/bleed easily.  Psychiatric/Behavioral: Negative for dysphoric mood and sleep disturbance (sleeps with 2 pillows). The patient is not nervous/anxious.    Vitals:   02/17/20 0846  BP: 98/66  Pulse: 74  Resp: 20  SpO2: 100%  Weight: 208 lb 4 oz (94.5 kg)  Height: 6' (1.829 m)    Wt Readings from Last 3 Encounters:  02/17/20 208 lb 4 oz (94.5 kg)  12/18/19 219 lb 12.8 oz (99.7 kg)  12/10/19 249 lb (112.9 kg)   Lab Results  Component Value Date   CREATININE 0.80 12/10/2019   CREATININE 0.91 12/09/2019   CREATININE 0.76 12/08/2019    Physical Exam Constitutional:      Appearance: Normal appearance.  HENT:     Head: Normocephalic and atraumatic.  Cardiovascular:     Rate and Rhythm: Normal rate and regular rhythm.  Pulmonary:     Effort: Pulmonary effort is normal.     Breath sounds: No wheezing, rhonchi or rales.  Abdominal:     General: There is no distension.     Palpations: Abdomen is soft.  Musculoskeletal:        General: No tenderness.     Cervical back: Normal range of motion and neck supple.     Right lower leg: No edema.     Left  lower leg: No edema.  Skin:    General: Skin is warm and dry.  Neurological:     General: No focal deficit present.     Mental Status: He is alert and oriented to person, place, and time.  Psychiatric:        Mood and Affect: Mood normal.        Behavior: Behavior normal.    Assessment & Plan:  1: Chronic heart failure with reduced ejection fraction- - NYHA class I - euvolemic today - weighing daily and reminded to call for an overnight weight gain of >2 pounds or a weekly weight gain of >5 pounds - weight down 11 pounds from last visit here 2 months ago - not adding salt to his food - will change his metoprolol tartrate to metoprolol succinate 180m daily - saw cardiology (Clayborn Bigness 05/01/18 - encouraged him to walk outside to the stop sign every day - BNP 12/08/19 was 2195.0 - has hospice involved and patient says that they come every "1-2 weeks" - consider adding verquvo since HF hospitalization within the last 6 months  2: HTN- - BP on the low side today; facility BP readings have also been on the low side - D/C amlodipine - saw PCP ([facility]) - BMP on 12/10/19 reviewed and showed  sodium 142, potassium 3.5, creatinine 0.8 and GFR >60  3: DM- - A1c 11/29/2018 was 8.3% - glucose at the facility this morning was 99  4: Schizophrenia- - living at a group home called [facility] - patient cooperative   5: COPD- - saw pulmonology (Ashby Dawes 11/29/17 - smokes 3-4 cigarettes daily - complete cessation discussed for 3 minutes with him    Facility medication list was reviewed.  Return in 1 month or sooner for any questions/problems before then.

## 2020-02-17 ENCOUNTER — Other Ambulatory Visit: Payer: Self-pay

## 2020-02-17 ENCOUNTER — Encounter: Payer: Self-pay | Admitting: Family

## 2020-02-17 ENCOUNTER — Ambulatory Visit: Attending: Family | Admitting: Family

## 2020-02-17 VITALS — BP 98/66 | HR 74 | Resp 20 | Ht 72.0 in | Wt 208.2 lb

## 2020-02-17 DIAGNOSIS — Z8249 Family history of ischemic heart disease and other diseases of the circulatory system: Secondary | ICD-10-CM | POA: Diagnosis not present

## 2020-02-17 DIAGNOSIS — F209 Schizophrenia, unspecified: Secondary | ICD-10-CM | POA: Diagnosis not present

## 2020-02-17 DIAGNOSIS — Z886 Allergy status to analgesic agent status: Secondary | ICD-10-CM | POA: Insufficient documentation

## 2020-02-17 DIAGNOSIS — F203 Undifferentiated schizophrenia: Secondary | ICD-10-CM

## 2020-02-17 DIAGNOSIS — Z7952 Long term (current) use of systemic steroids: Secondary | ICD-10-CM | POA: Diagnosis not present

## 2020-02-17 DIAGNOSIS — I110 Hypertensive heart disease with heart failure: Secondary | ICD-10-CM | POA: Insufficient documentation

## 2020-02-17 DIAGNOSIS — I5022 Chronic systolic (congestive) heart failure: Secondary | ICD-10-CM | POA: Diagnosis present

## 2020-02-17 DIAGNOSIS — J42 Unspecified chronic bronchitis: Secondary | ICD-10-CM

## 2020-02-17 DIAGNOSIS — Z79899 Other long term (current) drug therapy: Secondary | ICD-10-CM | POA: Insufficient documentation

## 2020-02-17 DIAGNOSIS — E119 Type 2 diabetes mellitus without complications: Secondary | ICD-10-CM | POA: Diagnosis not present

## 2020-02-17 DIAGNOSIS — F17210 Nicotine dependence, cigarettes, uncomplicated: Secondary | ICD-10-CM | POA: Diagnosis not present

## 2020-02-17 DIAGNOSIS — Z7984 Long term (current) use of oral hypoglycemic drugs: Secondary | ICD-10-CM | POA: Diagnosis not present

## 2020-02-17 DIAGNOSIS — J449 Chronic obstructive pulmonary disease, unspecified: Secondary | ICD-10-CM | POA: Diagnosis not present

## 2020-02-17 DIAGNOSIS — I10 Essential (primary) hypertension: Secondary | ICD-10-CM

## 2020-02-17 MED ORDER — METOPROLOL SUCCINATE ER 100 MG PO TB24: 100.0000 mg | ORAL_TABLET | Freq: Every day | ORAL | 3 refills | Status: DC

## 2020-02-17 NOTE — Patient Instructions (Signed)
Continue weighing daily and call for an overnight weight gain of > 2 pounds or a weekly weight gain of >5 pounds. 

## 2020-03-17 NOTE — Progress Notes (Signed)
Patient ID: Paul Hernandez, male    DOB: [birth year], 54 y.o.   MRN: [id number]  HPI  Paul Hernandez is a 54 y/o male with a history of HTN, DM, COPD, schizophrenia, current tobacco use and chronic heart failure.   Echo report from 12/04/19 reviewed and showed an EF of 20-25%. Echo report from 11/30/2018 reviewed and showed an EF of 20-25%.  Admitted 12/03/19 due to acute on chronic heart failure. Cardiology consult obtained. Initially needed vasopressors, IV steroids and midodrine in the ICU. Given IV lasix, bipap and nebulizers. Had NSVT and amiodarone was added. Discharged after 7 days.   He presents today for with a chief complaint of a follow-up visit. He currently doesn't have any symptoms and specifically denies any difficulty sleeping, dizziness, abdominal distention, palpitations, pedal edema, chest pain, wheezing, shortness of breath, cough, fatigue or weight gain  Past Medical History:  Diagnosis Date  . CHF (congestive heart failure) ([place]) 12/05/2019  . COPD (chronic obstructive pulmonary disease) ([place]) 12/05/2019  . Diabetes mellitus without complication ([place]) 41/96/2229  . Hypertension 12/05/2019  . Mental health disorder 12/05/2019   Past Surgical History:  Procedure Laterality Date  . NO PAST SURGERIES     Family History  Problem Relation Age of Onset  . CVA Mother   . CAD Father    Social History   Tobacco Use  . Smoking status: Former Smoker    Packs/day: 1.00    Years: 35.00    Pack years: 35.00    Types: Cigarettes    Start date: 09/07/2019  . Smokeless tobacco: Never Used  Substance Use Topics  . Alcohol use: No   Allergies  Allergen Reactions  . Asa [Aspirin]    Prior to Admission medications   Medication Sig Start Date End Date Taking? Authorizing Provider  albuterol (VENTOLIN HFA) 108 (90 Base) MCG/ACT inhaler Inhale 2 puffs into the lungs every 6 (six) hours as needed for wheezing or shortness of breath. 07/08/19   Blake Divine, MD  amiodarone  (PACERONE) 200 MG tablet Take 1 tablet (200 mg total) by mouth daily. 12/11/19   Jennye Boroughs, MD  Cholecalciferol (VITAMIN D3) 5000 units CAPS Take 5,000 Units by mouth daily.    [provider]  dapagliflozin propanediol (FARXIGA) 5 MG TABS tablet Take 5 mg by mouth daily.    [provider]  dextrose (GLUTOSE) 40 % GEL Take by mouth once as needed for low blood sugar. For blood sugar <70    [provider]  ezetimibe (ZETIA) 10 MG tablet Take 10 mg by mouth daily.    [provider]  fluticasone (FLONASE) 50 MCG/ACT nasal spray Place 1 spray into both nostrils daily.    [provider]  formoterol (PERFOROMIST) 20 MCG/2ML nebulizer solution Take 20 mcg by nebulization 2 (two) times daily.    [provider]  Glycopyrrolate (LONHALA MAGNAIR STARTER KIT) 25 MCG/ML SOLN Inhale 25 mcg into the lungs 2 (two) times daily.    [provider]  haloperidol decanoate (HALDOL DECANOATE) 100 MG/ML injection Inject 90 mg into the muscle every 28 (twenty-eight) days.     [provider]  ipratropium-albuterol (DUONEB) 0.5-2.5 (3) MG/3ML SOLN Take 3 mLs by nebulization 4 (four) times daily.    [provider]  isosorbide-hydrALAZINE (BIDIL) 20-37.5 MG tablet Take 1 tablet by mouth 3 (three) times daily.    [provider]  ivabradine (CORLANOR) 5 MG TABS tablet Take 1 tablet (5 mg total) by mouth 2 (two)  times daily with a meal. 08/11/19   Cherisse Carrell, [name], FNP  liraglutide (VICTOZA) 18 MG/3ML SOPN Inject 1.2 mg into the skin daily.    [provider]  LORazepam (ATIVAN) 0.5 MG tablet Take 0.5 mg by mouth every 4 (four) hours as needed (agitation).    [provider]  metFORMIN (GLUCOPHAGE) 500 MG tablet Take 1,000 mg by mouth 2 (two) times daily with a meal.    [provider]  metoprolol succinate (TOPROL-XL) 100 MG 24 hr tablet Take 1 tablet (100 mg total) by mouth daily. Take with or  immediately following a meal. 02/17/20 05/17/20  Darylene Price A, FNP  potassium chloride SA (KLOR-CON) 20 MEQ tablet Take 1 tablet (20 mEq total) by mouth daily. 12/10/19   Jennye Boroughs, MD  predniSONE (DELTASONE) 20 MG tablet Take 20 mg by mouth daily.    [provider]  rosuvastatin (CRESTOR) 40 MG tablet Take 40 mg by mouth daily.    [provider]  sacubitril-valsartan (ENTRESTO) 97-103 MG Take 1 tablet by mouth 2 (two) times daily. 08/04/19   [name], FNP  spironolactone (ALDACTONE) 25 MG tablet Take 0.5 tablets (12.5 mg total) by mouth daily. 08/28/19 02/17/20  [name], FNP  torsemide (DEMADEX) 20 MG tablet Take 2 tablets (40 mg total) by mouth daily. 08/06/19 02/17/20  [name], FNP     Review of Systems  Constitutional: Negative for appetite change, fatigue and fever.  HENT: Negative for congestion, rhinorrhea and sore throat.   Eyes: Negative.   Respiratory: Negative for cough, chest tightness, shortness of breath and wheezing.   Cardiovascular: Negative for chest pain, palpitations and leg swelling.  Gastrointestinal: Negative for abdominal distention and abdominal pain.  Endocrine: Negative.   Genitourinary: Negative.   Musculoskeletal: Negative for arthralgias and back pain.  Skin: Negative for rash and wound.  Allergic/Immunologic: Negative.   Neurological: Negative for dizziness and light-headedness.  Hematological: Negative for adenopathy. Does not bruise/bleed easily.  Psychiatric/Behavioral: Negative for dysphoric mood and sleep disturbance (sleeps with 2 pillows). The patient is not nervous/anxious.    Vitals:   03/18/20 0903  BP: 107/65  Pulse: 70  Resp: 18  SpO2: 100%  Weight: 211 lb 6 oz (95.9 kg)  Height: 6' (1.829 m)   Wt Readings from Last 3 Encounters:  03/18/20 211 lb 6 oz (95.9 kg)  02/17/20 208 lb 4 oz (94.5 kg)  12/18/19 219 lb 12.8 oz (99.7 kg)   Lab Results  Component Value Date   CREATININE 0.80  12/10/2019   CREATININE 0.91 12/09/2019   CREATININE 0.76 12/08/2019    Physical Exam Constitutional:      Appearance: Normal appearance.  HENT:     Head: Normocephalic and atraumatic.  Cardiovascular:     Rate and Rhythm: Normal rate and regular rhythm.  Pulmonary:     Effort: Pulmonary effort is normal.     Breath sounds: No wheezing, rhonchi or rales.  Abdominal:     General: There is no distension.     Palpations: Abdomen is soft.  Musculoskeletal:        General: No tenderness.     Cervical back: Normal range of motion and neck supple.     Right lower leg: No edema.     Left lower leg: No edema.  Skin:    General: Skin is warm and dry.  Neurological:     General: No focal deficit present.     Mental Status: He is  alert and oriented to person, place, and time.  Psychiatric:        Mood and Affect: Mood normal.        Behavior: Behavior normal.    Assessment & Plan:  1: Chronic heart failure with reduced ejection fraction- - NYHA class I - euvolemic today - weighing daily and reminded to call for an overnight weight gain of >2 pounds or a weekly weight gain of >5 pounds - weight up 3 pounds from last visit here 1 month ago - not adding salt to his food - saw cardiology Clayborn Bigness) 05/01/18 - consider stopping ivabradine and titrating up metoprolol at his next visit - encouraged him to walk outside to the stop sign every day - BNP 12/08/19 was 2195.0 - hospice has released him - will check BMP/Magnesium level today - verquvo contraindicated due to patient being on nitrates (bidil) - Pharm D reconciled facility medication list  2: HTN- - BP better this visit - amlodipine stopped at last visit - sees PCP ([facility]) - BMP on 12/10/19 reviewed and showed sodium 142, potassium 3.5, creatinine 0.8 and GFR >60  3: DM- - A1c 11/29/2018 was 8.3% - glucose at the facility this morning was 84  4: Schizophrenia- - living at a group home called [facility] - patient cooperative   5: COPD- - saw pulmonology Ashby Dawes) 11/29/17; will discuss at his next visit making a f/u appointment - smokes 3-4 cigarettes daily - complete cessation discussed for 3 minutes with him    Facility medication list was reviewed.  Return in 3 months or sooner for any questions/problems before then.

## 2020-03-18 ENCOUNTER — Other Ambulatory Visit: Payer: Self-pay

## 2020-03-18 ENCOUNTER — Ambulatory Visit: Admitting: Family

## 2020-03-18 ENCOUNTER — Other Ambulatory Visit
Admission: RE | Admit: 2020-03-18 | Discharge: 2020-03-18 | Disposition: A | Discharge status: Home / Self Care | Source: Ambulatory Visit | Attending: Family | Admitting: Family

## 2020-03-18 ENCOUNTER — Encounter: Payer: Self-pay | Admitting: Family

## 2020-03-18 VITALS — BP 107/65 | HR 70 | Resp 18 | Ht 72.0 in | Wt 211.4 lb

## 2020-03-18 DIAGNOSIS — I10 Essential (primary) hypertension: Secondary | ICD-10-CM

## 2020-03-18 DIAGNOSIS — I5022 Chronic systolic (congestive) heart failure: Secondary | ICD-10-CM

## 2020-03-18 DIAGNOSIS — F203 Undifferentiated schizophrenia: Secondary | ICD-10-CM

## 2020-03-18 DIAGNOSIS — E119 Type 2 diabetes mellitus without complications: Secondary | ICD-10-CM

## 2020-03-18 DIAGNOSIS — J42 Unspecified chronic bronchitis: Secondary | ICD-10-CM

## 2020-03-18 LAB — MAGNESIUM: Magnesium: 1.8 mg/dL (ref 1.7–2.4)

## 2020-03-18 LAB — BASIC METABOLIC PANEL
Anion gap: 11 (ref 5–15)
BUN: 25 mg/dL — ABNORMAL HIGH (ref 6–20)
CO2: 24 mmol/L (ref 22–32)
Calcium: 9 mg/dL (ref 8.9–10.3)
Chloride: 100 mmol/L (ref 98–111)
Creatinine, Ser: 1.78 mg/dL — ABNORMAL HIGH (ref 0.61–1.24)
GFR calc Af Amer: 50 mL/min — ABNORMAL LOW (ref >60)
GFR calc non Af Amer: 43 mL/min — ABNORMAL LOW (ref >60)
Glucose, Bld: 316 mg/dL — ABNORMAL HIGH (ref 70–99)
Potassium: 4.1 mmol/L (ref 3.5–5.1)
Sodium: 135 mmol/L (ref 135–145)

## 2020-03-18 NOTE — Addendum Note (Signed)
Addended by: Clarisa Kindred A on: 03/18/2020 10:19 AM   Modules accepted: Orders

## 2020-03-18 NOTE — Progress Notes (Signed)
[facility] - PHARMACIST COUNSELING NOTE  ADHERENCE ASSESSMENT  Adherence strategy: Patient resides at a group home. MAR reviewed.    Do you ever forget to take your medication? '[]' Yes (1) '[x]' No (0)  Do you ever skip doses due to side effects? '[]' Yes (1) '[x]' No (0)  Do you have trouble affording your medicines? '[]' Yes (1) '[x]' No (0)  Are you ever unable to pick up your medication due to transportation difficulties? '[]' Yes (1) '[x]' No (0)  Do you ever stop taking your medications because you don't believe they are helping? '[]' Yes (1) '[x]' No (0)  Total score 0   Recommendations given to patient about increasing adherence: None needed  Guideline-Directed Medical Therapy/Evidence Based Medicine  ACE/ARB/ARNI: Entresto 97-103 mg BID Beta Blocker: Metoprolol succinate 100 mg daily Aldosterone Antagonist: Spironolactone 12.5 mg daily Diuretic: Torsemide 40 mg daily    SUBJECTIVE  HPI: Patient is a 54 y/o M with PMH as below who presents to CHF clinic for follow-up. Patient was most recently hospitalized 2/10 - 2/17 with CHF exacerbation and COPD exacerbation complicated by hypotension requiring vasopressors, acute hypoxic respiratory failure requiring BiPAP, and nonsustained Vtach treated with amiodarone.   Past Medical History:  Diagnosis Date  . CHF (congestive heart failure) ([place]) 12/05/2019  . COPD (chronic obstructive pulmonary disease) ([place]) 12/05/2019  . Diabetes mellitus without complication ([place]) 77/93/9030  . Hypertension 12/05/2019  . Mental health disorder 12/05/2019     OBJECTIVE   Vital signs: HR 70, BP 107/65, weight (pounds) 211 ECHO: Date 12/04/19, EF 20-25%, notes: LV global hypokinesis, LV internal cavity severely dilated, grade I diastolic dysfunction. RV function moderately reduced. RV mildly enlarged. Normal PA systolic pressure.  BMP Latest Ref Rng & Units 12/10/2019 12/09/2019 12/08/2019  Glucose 70 - 99 mg/dL 108(H) 182(H)  169(H)  BUN 6 - 20 mg/dL 20 22(H) 20  Creatinine 0.61 - 1.24 mg/dL 0.80 0.91 0.76  Sodium 135 - 145 mmol/L 142 139 138  Potassium 3.5 - 5.1 mmol/L 3.5 3.4(L) 4.1  Chloride 98 - 111 mmol/L 89(L) 89(L) 92(L)  CO2 22 - 32 mmol/L 42(H) 42(H) 41(H)  Calcium 8.9 - 10.3 mg/dL 8.1(L) 7.7(L) 7.9(L)    ASSESSMENT  Patient is well appearing and in no acute distress. He is in good spirits today and says he feels good. Reports breathing has been stable / denies edema. Denies adverse effects of therapy. Patient is weighed daily at group home - weights have been stable.   PLAN  1). HFrEF -GDMT with Entresto 97-103 mg BID, metoprolol succinate 100 mg daily, spironolactone 12.5 mg daily, ivabradine 5 mg BIDM, and Bidil 20-37.5 mg TID -HR 70 today ; beta blocker is not at goal , consider increasing beta blocker and backing off on ivabradine -Verquvo contraindicated due to nitrates -Fluid management with torsemide 40 mg daily + potassium 20 mEq daily -Discussed with provider - no changes to medication regimen, check BMP today  2). Hypertension -Normotensive today -Group home checks BP once per week. BP is controlled ; runs in low 100s / 70s-80s -Denies signs/symptoms of hypotension  3). COPD -Duonebs 4x/day, formoterol 20 mcg nebs BID, glycopyrrolate 25 mcg BID, albuterol HFA PRN -Prednisone 20 mg daily which appears to have been prescribed during last hospital admission. Unclear duration of therapy  4). T2DM -HgbA1c 8.3% on 11/29/18 -Antihyperglycemic regimen includes metformin 1000 mg BIDM, Victoza 1.2 mg daily, Farxiga 5 mg daily  5). Hyperlipidemia -Rosuvastatin 40 mg daily, ezetimibe 10 mg daily -Lipid panel 11/29/18  with HDL 47, LDL 149, TG 68  6). Ventricular tachycardia -Amiodarone 200 mg daily  7). Schizophrenia -Haldol decanoate 90 mg q28d -Lorazepam 0.5 mg q4h PRN agitation  8). Drug-drug interactions -QTc prolonging agents include amiodarone, ivabradine, and haloperidol -07/08/19  QTc 480   Time spent: 15 minutes  [place] Resident 03/18/2020 8:41 AM    Current Outpatient Medications:  .  albuterol (VENTOLIN HFA) 108 (90 Base) MCG/ACT inhaler, Inhale 2 puffs into the lungs every 6 (six) hours as needed for wheezing or shortness of breath., Disp: 8 g, Rfl: 0 .  amiodarone (PACERONE) 200 MG tablet, Take 1 tablet (200 mg total) by mouth daily., Disp: 30 tablet, Rfl: 0 .  Cholecalciferol (VITAMIN D3) 5000 units CAPS, Take 5,000 Units by mouth daily., Disp: , Rfl:  .  dapagliflozin propanediol (FARXIGA) 5 MG TABS tablet, Take 5 mg by mouth daily., Disp: , Rfl:  .  dextrose (GLUTOSE) 40 % GEL, Take by mouth once as needed for low blood sugar. For blood sugar <70, Disp: , Rfl:  .  ezetimibe (ZETIA) 10 MG tablet, Take 10 mg by mouth daily., Disp: , Rfl:  .  fluticasone (FLONASE) 50 MCG/ACT nasal spray, Place 1 spray into both nostrils daily., Disp: , Rfl:  .  formoterol (PERFOROMIST) 20 MCG/2ML nebulizer solution, Take 20 mcg by nebulization 2 (two) times daily., Disp: , Rfl:  .  Glycopyrrolate (LONHALA MAGNAIR STARTER KIT) 25 MCG/ML SOLN, Inhale 25 mcg into the lungs 2 (two) times daily., Disp: , Rfl:  .  haloperidol decanoate (HALDOL DECANOATE) 100 MG/ML injection, Inject 90 mg into the muscle every 28 (twenty-eight) days. , Disp: , Rfl:  .  ipratropium-albuterol (DUONEB) 0.5-2.5 (3) MG/3ML SOLN, Take 3 mLs by nebulization 4 (four) times daily., Disp: , Rfl:  .  isosorbide-hydrALAZINE (BIDIL) 20-37.5 MG tablet, Take 1 tablet by mouth 3 (three) times daily., Disp: , Rfl:  .  ivabradine (CORLANOR) 5 MG TABS tablet, Take 1 tablet (5 mg total) by mouth 2 (two) times daily with a meal., Disp: 60 tablet, Rfl: 3 .  liraglutide (VICTOZA) 18 MG/3ML SOPN, Inject 1.2 mg into the skin daily., Disp: , Rfl:  .  LORazepam (ATIVAN) 0.5 MG tablet, Take 0.5 mg by mouth every 4 (four) hours as needed (agitation)., Disp: , Rfl:  .  metFORMIN (GLUCOPHAGE) 500 MG tablet, Take  1,000 mg by mouth 2 (two) times daily with a meal., Disp: , Rfl:  .  metoprolol succinate (TOPROL-XL) 100 MG 24 hr tablet, Take 1 tablet (100 mg total) by mouth daily. Take with or immediately following a meal., Disp: 90 tablet, Rfl: 3 .  potassium chloride SA (KLOR-CON) 20 MEQ tablet, Take 1 tablet (20 mEq total) by mouth daily., Disp: 5 tablet, Rfl: 0 .  predniSONE (DELTASONE) 20 MG tablet, Take 20 mg by mouth daily., Disp: , Rfl:  .  rosuvastatin (CRESTOR) 40 MG tablet, Take 40 mg by mouth daily., Disp: , Rfl:  .  sacubitril-valsartan (ENTRESTO) 97-103 MG, Take 1 tablet by mouth 2 (two) times daily., Disp: 60 tablet, Rfl: 5 .  spironolactone (ALDACTONE) 25 MG tablet, Take 0.5 tablets (12.5 mg total) by mouth daily., Disp: 15 tablet, Rfl: 3 .  torsemide (DEMADEX) 20 MG tablet, Take 2 tablets (40 mg total) by mouth daily., Disp: 180 tablet, Rfl: 3   COUNSELING POINTS/CLINICAL PEARLS  Carvedilol (Goal: weight less than 85 kg is 25 mg BID, weight greater than 85 kg is 50 mg BID)  Patient  should avoid activities requiring coordination until drug effects are realized, as drug may cause dizziness.  This drug may cause diarrhea, nausea, vomiting, arthralgia, back pain, myalgia, headache, vision disorder, erectile dysfunction, reduced libido, or fatigue.  Instruct patient to report signs/symptoms of adverse cardiovascular effects such as hypotension (especially in elderly patients), arrhythmias, syncope, palpitations, angina, or edema.  Drug may mask symptoms of hypoglycemia. Advise diabetic patients to carefully monitor blood sugar levels.  Patient should take drug with food.  Advise patient against sudden discontinuation of drug. Bisoprolol (Goal: 10 mg once daily) Patient should avoid activities requiring mental alertness or coordination until drug effects are realized.  This drug may cause bradyarrhythmia, cold extremities, hypotension, dyspepsia, dizziness, headache, dyssomnia, upper respiratory  infection, or fatigue.  Drug may mask symptoms of hypoglycemia. Advise diabetic patients to carefully monitor blood sugar levels.  Advise patient against sudden discontinuation of drug.  Instruct patient to take a missed dose as soon as possible, but if next dose is in less than 8 h, skip the missed dose. Metoprolol Succinate (Goal: 200 mg once daily) Warn patient to avoid activities requiring mental alertness or coordination until drug effects are realized, as drug may cause dizziness. Tell patient planning major surgery with anesthesia to alert physician that drug is being used, as drug impairs ability of heart to respond to reflex adrenergic stimuli. Drug may cause diarrhea, fatigue, headache, or depression. Advise diabetic patient to carefully monitor blood glucose as drug may mask symptoms of hypoglycemia. Patient should take extended-release tablet with or immediately following meals. Counsel patient against sudden discontinuation of drug, as this may precipitate hypertension, angina, or myocardial infarction. In the event of a missed dose, counsel patient to skip the missed dose and maintain a regular dosing schedule. Lisinopril (Goal: 20 to 40 mg once daily)  This drug may cause nausea, vomiting, dizziness, headache, or angioedema of face, lips, throat, or intestines.  Instruct patient to report signs/symptoms of hypotension, or a persistent cough.  Advise patient against sudden discontinuation of drug. Enalapril (Goal: 10 to 20 mg mg twice daily)  Patient should avoid activities requiring mental alertness or coordination until drug effects are realized.  Instruct patient to rise slowly from a sitting/supine position, as drug may cause orthostatic hypotension.  This drug may cause nausea, vomiting, diarrhea, fatigue, rash, dizziness, headache, or asthenia.  Instruct patient to report signs/symptoms of hypotension (lightheadedness or syncope) or persistent cough.  Tell patient to report  symptoms of angioedema (swelling of face, extremities, eyes, lips, or tongue, or difficulty in swallowing or breathing) or intestinal angioedema (abdominal pain).  Instruct patient to report symptoms of hepatic failure (jaundice) or acute renal failure.  Advise patient to maintain adequate hydration during therapy to prevent volume depletion and an excessive fall in blood pressure.  Instruct patient to immediately report signs/symptoms of infection (sore throat or fever).  Instruct patients/caregivers on the preparation method for the oral solution or suspension and inform them of the expiration date following reconstitution.  Patient should avoid use of potassium-sparing diuretics or potassium-containing supplements or salt substitutes without first consulting their healthcare provider, as the drug may cause increased potassium levels. Losartan (Goal: 150 mg once daily)  Warn male patient to avoid pregnancy and to report a pregnancy that occurs during therapy.  Side effects may include dizziness, upper respiratory infection, nasal congestion, and back pain.  Warn patient to avoid use of potassium supplements or potassium-containing salt substitutes unless they consult healthcare provider. Valsartan (Goal: 160 mg twice daily)  Advise patient to report lightheadedness or syncope.  Tell patient to avoid activities requiring coordination until drug effects are realized, as this medicine may cause dizziness.  Side effects may include abdominal pain, diarrhea, hypotension, headache, cough, or fatigue.  Advise patient to avoid potassium supplements and foods/salt substitutes that are high in potassium. Entresto (Goal: 97/103 mg twice daily)  Warn male patient to avoid pregnancy during therapy and to report a pregnancy to a physician.  Advise patient to report symptomatic hypotension.  Side effects may include hyperkalemia, cough, dizziness, or renal failure. Furosemide  Drug causes sun-sensitivity.  Advise patient to use sunscreen and avoid tanning beds. Patient should avoid activities requiring coordination until drug effects are realized, as drug may cause dizziness, vertigo, or blurred vision. This drug may cause hyperglycemia, hyperuricemia, constipation, diarrhea, loss of appetite, nausea, vomiting, purpuric disorder, cramps, spasticity, asthenia, headache, paresthesia, or scaling eczema. Instruct patient to report unusual bleeding/bruising or signs/symptoms of hypotension, infection, pancreatitis, or ototoxicity (tinnitus, hearing impairment). Advise patient to report signs/symptoms of a severe skin reactions (flu-like symptoms, spreading red rash, or skin/mucous membrane blistering) or erythema multiforme. Instruct patient to eat high-potassium foods during drug therapy, as directed by healthcare professional.  Patient should not drink alcohol while taking this drug. Torsemide  Side effects may include excessive urination.  Tell patient to report symptoms of ototoxicity.  Instruct patient to report lightheadedness or syncope.  Warn patient to avoid use of nonprescription NSAID products without first discussing it with their healthcare provider. Spironolactone  Warn patient to report dehydration, hypotension, or symptoms of worsening renal function.  Counsel male patient to report gynecomastia.  Side effects may include diarrhea, nausea, vomiting, abdominal cramping, fever, leg cramps, lethargy, mental confusion, decreased libido, irregular menses, and rash. Suspension: Tell patient to take drug consistently with respect to food, either before or after a meal.  Advise patient to avoid potassium supplements and foods containing high levels of potassium, including salt substitutes. Eplerenone  Patient should avoid activities requiring coordination until drug effects are realized, as drug may cause dizziness.  This drug may cause diarrhea, headache, cough, fatigue, influenza-like illness,  angina, or myocardial infarction.  Patient should avoid potassium supplements, potassium-containing salt substitutes and other potassium-sparing diuretics during therapy.  DRUGS TO AVOID IN HEART FAILURE  Drug or Class Mechanism  Analgesics . NSAIDs . COX-2 inhibitors . Glucocorticoids  Sodium and water retention, increased systemic vascular resistance, decreased response to diuretics   Diabetes Medications . Metformin . Thiazolidinediones o Rosiglitazone (Avandia) o Pioglitazone (Actos) . DPP4 Inhibitors o Saxagliptin (Onglyza) o Sitagliptin (Januvia)   Lactic acidosis Possible calcium channel blockade   Unknown  Antiarrhythmics . Class I  o Flecainide o Disopyramide . Class III o Sotalol . Other o Dronedarone  Negative inotrope, proarrhythmic   Proarrhythmic, beta blockade  Negative inotrope  Antihypertensives . Alpha Blockers o Doxazosin . Calcium Channel Blockers o Diltiazem o Verapamil o Nifedipine . Central Alpha Adrenergics o Moxonidine . Peripheral Vasodilators o Minoxidil  Increases renin and aldosterone  Negative inotrope    Possible sympathetic withdrawal  Unknown  Anti-infective . Itraconazole . Amphotericin B  Negative inotrope Unknown  Hematologic . Anagrelide . Cilostazol   Possible inhibition of PD IV Inhibition of PD III causing arrhythmias  Neurologic/Psychiatric . Stimulants . Anti-Seizure Drugs o Carbamazepine o Pregabalin . Antidepressants o Tricyclics o Citalopram . Parkinsons o Bromocriptine o Pergolide o Pramipexole . Antipsychotics o Clozapine . Antimigraine o Ergotamine o Methysergide . Appetite suppressants . Bipolar o Lithium  Peripheral alpha and beta agonist activity  Negative inotrope and chronotrope Calcium channel blockade  Negative inotrope, proarrhythmic Dose-dependent QT prolongation  Excessive serotonin activity/valvular damage Excessive serotonin activity/valvular  damage Unknown  IgE mediated hypersensitivy, calcium channel blockade  Excessive serotonin activity/valvular damage Excessive serotonin activity/valvular damage Valvular damage  Direct myofibrillar degeneration, adrenergic stimulation  Antimalarials . Chloroquine . Hydroxychloroquine Intracellular inhibition of lysosomal enzymes  Urologic Agents . Alpha Blockers o Doxazosin o Prazosin o Tamsulosin o Terazosin  Increased renin and aldosterone  Adapted from Page RL, et al. "Drugs That May Cause or Exacerbate Heart Failure: A Scientific Statement from the [place]." Circulation 2016; 875:I43-P29. DOI: 10.1161/CIR.0000000000000426   MEDICATION ADHERENCES TIPS AND STRATEGIES 1. Taking medication as prescribed improves patient outcomes in heart failure (reduces hospitalizations, improves symptoms, increases survival) 2. Side effects of medications can be managed by decreasing doses, switching agents, stopping drugs, or adding additional therapy. Please let someone in the [facility] know if you have having bothersome side effects so we can modify your regimen. Do not alter your medication regimen without talking to [place].  3. Medication reminders can help patients remember to take drugs on time. If you are missing or forgetting doses you can try linking behaviors, using pill boxes, or an electronic reminder like an alarm on your phone or an app. Some people can also get automated phone calls as medication reminders.

## 2020-03-18 NOTE — Patient Instructions (Signed)
Continue weighing daily and call for an overnight weight gain of > 2 pounds or a weekly weight gain of >5 pounds. 

## 2020-03-19 ENCOUNTER — Telehealth: Payer: Self-pay | Admitting: Family

## 2020-03-19 MED ORDER — POTASSIUM CHLORIDE CRYS ER 20 MEQ PO TBCR: 10.0000 meq | EXTENDED_RELEASE_TABLET | Freq: Every day | ORAL | 5 refills | Status: DC

## 2020-03-19 MED ORDER — TORSEMIDE 20 MG PO TABS: 20.0000 mg | ORAL_TABLET | Freq: Every day | ORAL | 3 refills | Status: DC

## 2020-03-19 NOTE — Telephone Encounter (Signed)
Spoke with caregiver at the facility regarding lab work that was obtained yesterday. Potassium level looks good although renal function has worsened some from 0.8 to 1.78.   Will decrease his torsemide to 20mg  daily (was 40mg ) and decrease his potassium to 1/2 tablet daily (was whole ). Advised caregiver that PCP should re-check his lab work in a couple of weeks. Caregiver and transportation person verbalized understanding.

## 2020-03-29 ENCOUNTER — Other Ambulatory Visit: Payer: Self-pay | Admitting: Family

## 2020-06-17 ENCOUNTER — Ambulatory Visit: Payer: Medicare Other | Attending: Family | Admitting: Family

## 2020-06-17 ENCOUNTER — Other Ambulatory Visit: Payer: Self-pay

## 2020-06-17 ENCOUNTER — Encounter: Payer: Self-pay | Admitting: Family

## 2020-06-17 VITALS — BP 96/68 | HR 86 | Resp 18 | Ht 72.0 in | Wt 206.1 lb

## 2020-06-17 DIAGNOSIS — J449 Chronic obstructive pulmonary disease, unspecified: Secondary | ICD-10-CM | POA: Insufficient documentation

## 2020-06-17 DIAGNOSIS — I5022 Chronic systolic (congestive) heart failure: Secondary | ICD-10-CM | POA: Insufficient documentation

## 2020-06-17 DIAGNOSIS — F17210 Nicotine dependence, cigarettes, uncomplicated: Secondary | ICD-10-CM | POA: Diagnosis not present

## 2020-06-17 DIAGNOSIS — M25552 Pain in left hip: Secondary | ICD-10-CM | POA: Insufficient documentation

## 2020-06-17 DIAGNOSIS — Z79899 Other long term (current) drug therapy: Secondary | ICD-10-CM | POA: Diagnosis not present

## 2020-06-17 DIAGNOSIS — F209 Schizophrenia, unspecified: Secondary | ICD-10-CM | POA: Insufficient documentation

## 2020-06-17 DIAGNOSIS — I110 Hypertensive heart disease with heart failure: Secondary | ICD-10-CM | POA: Diagnosis not present

## 2020-06-17 DIAGNOSIS — Z8249 Family history of ischemic heart disease and other diseases of the circulatory system: Secondary | ICD-10-CM | POA: Insufficient documentation

## 2020-06-17 DIAGNOSIS — I10 Essential (primary) hypertension: Secondary | ICD-10-CM

## 2020-06-17 DIAGNOSIS — E119 Type 2 diabetes mellitus without complications: Secondary | ICD-10-CM | POA: Insufficient documentation

## 2020-06-17 DIAGNOSIS — F203 Undifferentiated schizophrenia: Secondary | ICD-10-CM

## 2020-06-17 DIAGNOSIS — J42 Unspecified chronic bronchitis: Secondary | ICD-10-CM

## 2020-06-17 DIAGNOSIS — Z886 Allergy status to analgesic agent status: Secondary | ICD-10-CM | POA: Diagnosis not present

## 2020-06-17 NOTE — Patient Instructions (Signed)
Continue weighing daily and call for an overnight weight gain of > 2 pounds or a weekly weight gain of >5 pounds. 

## 2020-06-17 NOTE — Progress Notes (Signed)
Patient ID: Paul Hernandez, male    DOB: 29-Jul-1966, 54 y.o.   MRN: [id number]  HPI  Paul Hernandez is a 54 y/o male with a history of HTN, DM, COPD, schizophrenia, current tobacco use and chronic heart failure.   Echo report from 12/04/19 reviewed and showed an EF of 20-25%. Echo report from 11/30/2018 reviewed and showed an EF of 20-25%.  Has not been admitted or been in the ED in the last 6 months.   He presents today for with a chief complaint of a follow-up visit. He currently has some improving left hip pain. He has no other symptoms and specifically denies any difficulty sleeping, dizziness, abdominal distention, palpitations, pedal edema, chest pain, wheezing, shortness of breath, cough, fatigue or weight gain.   Past Medical History:  Diagnosis Date  . CHF (congestive heart failure) ([facility]) 12/05/2019  . COPD (chronic obstructive pulmonary disease) ([facility]) 12/05/2019  . Diabetes mellitus without complication ([facility]) 12/05/2019  . Hypertension 12/05/2019  . Mental health disorder 12/05/2019   Past Surgical History:  Procedure Laterality Date  . NO PAST SURGERIES     Family History  Problem Relation Age of Onset  . CVA Mother   . CAD Father    Social History   Tobacco Use  . Smoking status: Former Smoker    Packs/day: 1.00    Years: 35.00    Pack years: 35.00    Types: Cigarettes    Start date: 09/07/2019  . Smokeless tobacco: Never Used  Substance Use Topics  . Alcohol use: No   Allergies  Allergen Reactions  . Asa [Aspirin]    Vitals:   06/17/20 0915  BP: 96/68  Pulse: 86  Resp: 18  SpO2: 99%  Weight: 206 lb 2 oz (93.5 kg)  Height: 6' (1.829 m)   Wt Readings from Last 3 Encounters:  06/17/20 206 lb 2 oz (93.5 kg)  03/18/20 211 lb 6 oz (95.9 kg)  02/17/20 208 lb 4 oz (94.5 kg)    Review of Systems  Constitutional: Negative for appetite change, fatigue and fever.  HENT: Negative for congestion, rhinorrhea and sore throat.   Eyes: Negative.   Respiratory:  Negative for cough, chest tightness, shortness of breath and wheezing.   Cardiovascular: Negative for chest pain, palpitations and leg swelling.  Gastrointestinal: Negative for abdominal distention and abdominal pain.  Endocrine: Negative.   Genitourinary: Negative.   Musculoskeletal: Positive for arthralgias (left hip pain). Negative for back pain.  Skin: Negative for rash and wound.  Allergic/Immunologic: Negative.   Neurological: Negative for dizziness and light-headedness.  Hematological: Negative for adenopathy. Does not bruise/bleed easily.  Psychiatric/Behavioral: Negative for dysphoric mood and sleep disturbance (sleeps with 2 pillows). The patient is not nervous/anxious.      Physical Exam Constitutional:      Appearance: Normal appearance.  HENT:     Head: Normocephalic and atraumatic.  Cardiovascular:     Rate and Rhythm: Normal rate and regular rhythm.  Pulmonary:     Effort: Pulmonary effort is normal.     Breath sounds: Wheezing (expiratory in lower lobes) present. No rhonchi or rales.  Abdominal:     General: There is no distension.     Palpations: Abdomen is soft.  Musculoskeletal:        General: No tenderness.     Cervical back: Normal range of motion and neck supple.     Right lower leg: No edema.     Left lower leg: No edema.  Skin:  General: Skin is warm and dry.  Neurological:     General: No focal deficit present.     Mental Status: He is alert and oriented to person, place, and time.  Psychiatric:        Mood and Affect: Mood normal.        Behavior: Behavior normal.    Assessment & Plan:  1: Chronic heart failure with reduced ejection fraction- - NYHA class I - euvolemic today - weighing daily and reminded to call for an overnight weight gain of >2 pounds or a weekly weight gain of >5 pounds - weight down 5 pounds from last visit here 3 months ago - not adding salt to his food - saw cardiology Paul Hernandez) 05/01/18 - encouraged him to walk  outside to the stop sign every day - BNP 12/08/19 was 2195.0 - hospice has released him - verquvo contraindicated due to patient being on nitrates (bidil) - reports receiving both covid vaccines  2: HTN- - BP chronically low but patient without dizziness - sees PCP ([facility]) & has echo scheduled on 07/12/20 - BMP on 04/27/20 reviewed and showed sodium 140, potassium 4.7, creatinine 1.42 and GFR 56  3: DM- - A1c 04/27/20 was 7.4% - glucose at the facility this morning was 81  4: Schizophrenia- - living at a group home called [facility] - patient cooperative   5: COPD- - saw pulmonology Paul Hernandez) 11/29/17 - smokes ~ 1/2 ppd cigarettes  - complete cessation discussed for 3 minutes with him   Facility medication list was reviewed.  Return in 6 months or sooner for any questions/problems before then.

## 2020-06-24 ENCOUNTER — Other Ambulatory Visit: Payer: Self-pay | Admitting: Family

## 2020-08-26 ENCOUNTER — Other Ambulatory Visit: Payer: Self-pay | Admitting: Family

## 2020-08-27 ENCOUNTER — Other Ambulatory Visit: Payer: Self-pay | Admitting: Internal Medicine

## 2020-12-14 ENCOUNTER — Other Ambulatory Visit: Payer: Self-pay | Admitting: Family

## 2020-12-14 ENCOUNTER — Other Ambulatory Visit: Payer: Self-pay

## 2020-12-14 ENCOUNTER — Encounter: Payer: Self-pay | Admitting: Family

## 2020-12-14 ENCOUNTER — Ambulatory Visit: Payer: Medicare Other | Attending: Family | Admitting: Family

## 2020-12-14 VITALS — BP 103/73 | HR 112 | Resp 18 | Ht 72.0 in | Wt 214.1 lb

## 2020-12-14 DIAGNOSIS — R000 Tachycardia, unspecified: Secondary | ICD-10-CM | POA: Diagnosis not present

## 2020-12-14 DIAGNOSIS — Z7952 Long term (current) use of systemic steroids: Secondary | ICD-10-CM | POA: Diagnosis not present

## 2020-12-14 DIAGNOSIS — F17210 Nicotine dependence, cigarettes, uncomplicated: Secondary | ICD-10-CM | POA: Diagnosis not present

## 2020-12-14 DIAGNOSIS — E119 Type 2 diabetes mellitus without complications: Secondary | ICD-10-CM | POA: Diagnosis not present

## 2020-12-14 DIAGNOSIS — F209 Schizophrenia, unspecified: Secondary | ICD-10-CM | POA: Insufficient documentation

## 2020-12-14 DIAGNOSIS — J42 Unspecified chronic bronchitis: Secondary | ICD-10-CM

## 2020-12-14 DIAGNOSIS — Z794 Long term (current) use of insulin: Secondary | ICD-10-CM | POA: Diagnosis not present

## 2020-12-14 DIAGNOSIS — I5022 Chronic systolic (congestive) heart failure: Secondary | ICD-10-CM | POA: Diagnosis present

## 2020-12-14 DIAGNOSIS — Z79899 Other long term (current) drug therapy: Secondary | ICD-10-CM | POA: Insufficient documentation

## 2020-12-14 DIAGNOSIS — J449 Chronic obstructive pulmonary disease, unspecified: Secondary | ICD-10-CM | POA: Diagnosis not present

## 2020-12-14 DIAGNOSIS — I110 Hypertensive heart disease with heart failure: Secondary | ICD-10-CM | POA: Diagnosis not present

## 2020-12-14 DIAGNOSIS — F203 Undifferentiated schizophrenia: Secondary | ICD-10-CM

## 2020-12-14 DIAGNOSIS — Z886 Allergy status to analgesic agent status: Secondary | ICD-10-CM | POA: Diagnosis not present

## 2020-12-14 DIAGNOSIS — M25559 Pain in unspecified hip: Secondary | ICD-10-CM | POA: Diagnosis not present

## 2020-12-14 DIAGNOSIS — Z8249 Family history of ischemic heart disease and other diseases of the circulatory system: Secondary | ICD-10-CM | POA: Diagnosis not present

## 2020-12-14 DIAGNOSIS — I10 Essential (primary) hypertension: Secondary | ICD-10-CM

## 2020-12-14 NOTE — Patient Instructions (Signed)
Continue weighing daily and call for an overnight weight gain of > 2 pounds or a weekly weight gain of >5 pounds. 

## 2020-12-14 NOTE — Progress Notes (Signed)
Patient ID: Paul Hernandez, male    DOB: 1965/11/19, 56 y.o.   MRN: [id number]  HPI  Paul Hernandez is a 55 y/o male with a history of HTN, DM, COPD, schizophrenia, current tobacco use and chronic heart failure.   Echo report from 12/04/19 reviewed and showed an EF of 20-25%. Echo report from 11/30/2018 reviewed and showed an EF of 20-25%.  Has not been admitted or been in the ED in the last 6 months.   He presents today for with a chief complaint of a follow-up visit. He endorses some intermittent hip pain and gradual weight gain. He denies any difficulty sleeping, dizziness, abdominal distention, palpitations, pedal edema, chest pain, shortness of breath, cough or fatigue.    Past Medical History:  Diagnosis Date  . CHF (congestive heart failure) ([place]) 12/05/2019  . COPD (chronic obstructive pulmonary disease) ([place]) 12/05/2019  . Diabetes mellitus without complication ([place]) 33/29/5188  . Hypertension 12/05/2019  . Mental health disorder 12/05/2019   Past Surgical History:  Procedure Laterality Date  . NO PAST SURGERIES     Family History  Problem Relation Age of Onset  . CVA Mother   . CAD Father    Social History   Tobacco Use  . Smoking status: Former Smoker    Packs/day: 1.00    Years: 35.00    Pack years: 35.00    Types: Cigarettes    Start date: 09/07/2019  . Smokeless tobacco: Never Used  Substance Use Topics  . Alcohol use: No   Allergies  Allergen Reactions  . Asa [Aspirin]    Prior to Admission medications   Medication Sig Start Date End Date Taking? Authorizing Provider  acetaminophen (TYLENOL) 500 MG tablet Take 500 mg by mouth every 4 (four) hours as needed.   Yes [provider]  albuterol (VENTOLIN HFA) 108 (90 Base) MCG/ACT inhaler Inhale 2 puffs into the lungs every 6 (six) hours as needed for wheezing or shortness of breath. 07/08/19  Yes Blake Divine, MD  amiodarone (PACERONE) 200 MG tablet Take 1 tablet (200 mg total) by mouth daily. 12/11/19   Yes Jennye Boroughs, MD  Cholecalciferol (VITAMIN D3) 5000 units CAPS Take by mouth daily.    Yes [provider]  dapagliflozin propanediol (FARXIGA) 5 MG TABS tablet Take 5 mg by mouth daily.   Yes [provider]  dextrose (GLUTOSE) 40 % GEL Take by mouth once as needed for low blood sugar. For blood sugar <70   Yes [provider]  ezetimibe (ZETIA) 10 MG tablet Take 10 mg by mouth daily.   Yes [provider]  fluticasone (FLONASE) 50 MCG/ACT nasal spray Place 1 spray into both nostrils daily.   Yes [provider]  formoterol (PERFOROMIST) 20 MCG/2ML nebulizer solution Take 20 mcg by nebulization 2 (two) times daily.   Yes [provider]  Glycopyrrolate (LONHALA MAGNAIR STARTER KIT) 25 MCG/ML SOLN Inhale 25 mcg into the lungs 2 (two) times daily.   Yes [provider]  haloperidol decanoate (HALDOL DECANOATE) 100 MG/ML injection Inject 90 mg into the muscle every 28 (twenty-eight) days.    Yes [provider]  Insulin Glargine-Lixisenatide (SOLIQUA) 100-33 UNT-MCG/ML SOPN Inject 18 Units into the skin daily.   Yes [provider]  ipratropium-albuterol (DUONEB) 0.5-2.5 (3) MG/3ML SOLN Take 3 mLs by nebulization 4 (four) times daily.   Yes [provider]  isosorbide-hydrALAZINE (BIDIL) 20-37.5 MG tablet Take 1 tablet by mouth 3 (three) times daily.   Yes  [provider]  LORazepam (ATIVAN) 0.5 MG tablet Take 0.5 mg by mouth every 4 (four) hours as needed (agitation).   Yes [provider]  metoprolol succinate (TOPROL-XL) 100 MG 24 hr tablet Take 1 tablet (100 mg total) by mouth daily. Take with or immediately following a meal. Patient taking differently: Take 25 mg by mouth daily. Take with or immediately following a meal. 02/17/20 06/17/20 Yes Channelle Bottger, [name], FNP  potassium chloride SA (KLOR-CON) 20 MEQ tablet TAKE 1/2 TABLET (10 MEQ TOTAL) BY MOUTH DAILY 08/27/20  Yes Darylene Price A, FNP   predniSONE (DELTASONE) 20 MG tablet Take 20 mg by mouth daily.   Yes [provider]  rosuvastatin (CRESTOR) 40 MG tablet Take 40 mg by mouth daily.   Yes [provider]  sacubitril-valsartan (ENTRESTO) 97-103 MG Take 1 tablet by mouth 2 (two) times daily. 08/04/19  Yes Kalah Pflum, [name], FNP  spironolactone (ALDACTONE) 25 MG tablet Take 0.5 tablets (12.5 mg total) by mouth daily. Patient taking differently: Take 25 mg by mouth daily. 08/28/19 06/17/20 Yes Hajra Port, [name], FNP  torsemide (DEMADEX) 20 MG tablet Take 1 tablet (20 mg total) by mouth daily. 03/19/20 06/17/20 Yes [name], FNP    Review of Systems  Constitutional: Negative for appetite change, fatigue and fever.  HENT: Negative for congestion, rhinorrhea and sore throat.   Eyes: Negative.   Respiratory: Negative for cough, chest tightness, shortness of breath and wheezing.   Cardiovascular: Negative for chest pain, palpitations and leg swelling.  Gastrointestinal: Negative for abdominal distention and abdominal pain.  Endocrine: Negative.   Genitourinary: Negative.   Musculoskeletal: Positive for arthralgias (left hip pain). Negative for back pain.  Skin: Negative for rash and wound.  Allergic/Immunologic: Negative.   Neurological: Negative for dizziness and light-headedness.  Hematological: Negative for adenopathy. Does not bruise/bleed easily.  Psychiatric/Behavioral: Negative for dysphoric mood and sleep disturbance (sleeps with 2 pillows). The patient is not nervous/anxious.    Vitals:   12/14/20 0902  BP: 103/73  Pulse: (!) 112  Resp: 18  SpO2: 99%  Weight: 214 lb 2 oz (97.1 kg)  Height: 6' (1.829 m)   Wt Readings from Last 3 Encounters:  12/14/20 214 lb 2 oz (97.1 kg)  06/17/20 206 lb 2 oz (93.5 kg)  03/18/20 211 lb 6 oz (95.9 kg)   Lab Results  Component Value Date   CREATININE 1.78 (H) 03/18/2020   CREATININE 0.80 12/10/2019   CREATININE 0.91 12/09/2019    Physical  Exam Constitutional:      Appearance: Normal appearance.  HENT:     Head: Normocephalic and atraumatic.  Cardiovascular:     Rate and Rhythm: Regular rhythm. Tachycardia present.  Pulmonary:     Effort: Pulmonary effort is normal.     Breath sounds: No wheezing, rhonchi or rales.  Abdominal:     General: There is no distension.     Palpations: Abdomen is soft.  Musculoskeletal:        General: No tenderness.     Cervical back: Normal range of motion and neck supple.     Right lower leg: No edema.     Left lower leg: No edema.  Skin:    General: Skin is warm and dry.  Neurological:     General: No focal deficit present.     Mental Status: He is alert and oriented to person, place, and time.  Psychiatric:        Mood and Affect: Mood normal.  Behavior: Behavior normal.    Assessment & Plan:  1: Chronic heart failure with reduced ejection fraction- - NYHA class I - euvolemic today - weighing daily and reminded to call for an overnight weight gain of >2 pounds or a weekly weight gain of >5 pounds - weight up 8 pounds from last visit here 6 months ago - reports his home weight ranges between 212-214 pounds - not adding salt to his food - saw cardiology Clayborn Bigness) 05/01/18 - BNP 12/08/19 was 2195.0 - hospice has released him - tachycardic today but his metoprolol is now down to 52m daily; continue to monitor and titrate up as necessary - verquvo contraindicated due to patient being on nitrates (bidil) - reports receiving all 3 covid vaccines - reports receiving his flu vaccine for this season  2: HTN- - BP looks good today (103/73) - sees PCP ([facility] at the group home - BMP on 04/27/20 reviewed and showed sodium 140, potassium 4.7, creatinine 1.42 and GFR 56  3: DM- - A1c 04/27/20 was 7.4% - glucose at the facility this morning was 94  4: Schizophrenia- - living at a group home called [facility] - patient cooperative   5: COPD- - saw  pulmonology (Ashby Dawes 11/29/17 - smokes ~ 1/2 ppd cigarettes  - complete cessation discussed for 3 minutes with him   Facility medication list was reviewed.  Return in 6 months or sooner for any questions/problems before then.

## 2020-12-14 NOTE — Progress Notes (Signed)
Opened in error

## 2021-02-25 ENCOUNTER — Other Ambulatory Visit: Payer: Self-pay | Admitting: Family

## 2021-03-26 ENCOUNTER — Other Ambulatory Visit: Payer: Self-pay | Admitting: Family

## 2021-05-19 LAB — COLOGUARD

## 2021-05-19 LAB — EXTERNAL GENERIC LAB PROCEDURE

## 2021-06-02 ENCOUNTER — Emergency Department
Admission: EM | Admit: 2021-06-02 | Discharge: 2021-06-02 | Disposition: A | Discharge status: Home / Self Care | Payer: Medicare Other | Attending: Student in an Organized Health Care Education/Training Program | Admitting: Student in an Organized Health Care Education/Training Program

## 2021-06-02 ENCOUNTER — Emergency Department: Payer: Medicare Other

## 2021-06-02 ENCOUNTER — Other Ambulatory Visit: Payer: Self-pay

## 2021-06-02 DIAGNOSIS — Z7951 Long term (current) use of inhaled steroids: Secondary | ICD-10-CM | POA: Diagnosis not present

## 2021-06-02 DIAGNOSIS — Z794 Long term (current) use of insulin: Secondary | ICD-10-CM | POA: Insufficient documentation

## 2021-06-02 DIAGNOSIS — R001 Bradycardia, unspecified: Secondary | ICD-10-CM | POA: Diagnosis present

## 2021-06-02 DIAGNOSIS — I110 Hypertensive heart disease with heart failure: Secondary | ICD-10-CM | POA: Diagnosis not present

## 2021-06-02 DIAGNOSIS — Z79899 Other long term (current) drug therapy: Secondary | ICD-10-CM | POA: Insufficient documentation

## 2021-06-02 DIAGNOSIS — J449 Chronic obstructive pulmonary disease, unspecified: Secondary | ICD-10-CM | POA: Insufficient documentation

## 2021-06-02 DIAGNOSIS — Z7984 Long term (current) use of oral hypoglycemic drugs: Secondary | ICD-10-CM | POA: Insufficient documentation

## 2021-06-02 DIAGNOSIS — E119 Type 2 diabetes mellitus without complications: Secondary | ICD-10-CM | POA: Insufficient documentation

## 2021-06-02 DIAGNOSIS — Z87891 Personal history of nicotine dependence: Secondary | ICD-10-CM | POA: Insufficient documentation

## 2021-06-02 DIAGNOSIS — I5023 Acute on chronic systolic (congestive) heart failure: Secondary | ICD-10-CM | POA: Diagnosis not present

## 2021-06-02 DIAGNOSIS — I499 Cardiac arrhythmia, unspecified: Secondary | ICD-10-CM | POA: Diagnosis not present

## 2021-06-02 LAB — CBC
HCT: 41.5 % (ref 39.0–52.0)
Hemoglobin: 13.7 g/dL (ref 13.0–17.0)
MCH: 28.7 pg (ref 26.0–34.0)
MCHC: 33 g/dL (ref 30.0–36.0)
MCV: 86.8 fL (ref 80.0–100.0)
Platelets: 276 10*3/uL (ref 150–400)
RBC: 4.78 MIL/uL (ref 4.22–5.81)
RDW: 17.8 % — ABNORMAL HIGH (ref 11.5–15.5)
WBC: 5.8 10*3/uL (ref 4.0–10.5)
nRBC: 0 % (ref 0.0–0.2)

## 2021-06-02 LAB — TROPONIN I (HIGH SENSITIVITY): Troponin I (High Sensitivity): 15 ng/L (ref <18)

## 2021-06-02 LAB — BASIC METABOLIC PANEL
Anion gap: 7 (ref 5–15)
BUN: 14 mg/dL (ref 6–20)
CO2: 25 mmol/L (ref 22–32)
Calcium: 9.4 mg/dL (ref 8.9–10.3)
Chloride: 106 mmol/L (ref 98–111)
Creatinine, Ser: 1.21 mg/dL (ref 0.61–1.24)
GFR, Estimated: >60 mL/min (ref >60)
Glucose, Bld: 79 mg/dL (ref 70–99)
Potassium: 4.3 mmol/L (ref 3.5–5.1)
Sodium: 138 mmol/L (ref 135–145)

## 2021-06-02 NOTE — ED Notes (Signed)
This RN spoke with Marlet from [facility], they stated pt is not a flight risk, will be safe to wait in the lobby, and they will send someone to come pick him up soon.

## 2021-06-02 NOTE — ED Triage Notes (Signed)
Pt here with bradycardia from the group home with staff. Staff states that HR was 48 and irregular. Pt denies CP. Pt here for eval.

## 2021-06-02 NOTE — ED Provider Notes (Signed)
[facility] Emergency Department Provider Note ____________________________________________   Event Date/Time   First MD Initiated Contact with Patient 06/02/21 1326     (approximate)  I have reviewed the triage vital signs and the nursing notes.   HISTORY  Chief Complaint Bradycardia  HPI Paul Hernandez is a 55 y.o. male with history of CHF, COPD, diabetes, hypertension, schizophrenia, and remaining history as listed below presents to the emergency department for treatment and evaluation of bradycardia. He had gone to [facility] for his haldol injection and when the nurse got his vital signs, his heart rate was low. He then went to his PCP and heart rate was 48. Patient denies pain or shortness of breath.          Past Medical History:  Diagnosis Date   CHF (congestive heart failure) ([place]) 12/05/2019   COPD (chronic obstructive pulmonary disease) ([place]) 12/05/2019   Diabetes mellitus without complication ([place]) 00/37/0488   Hypertension 12/05/2019   Mental health disorder 12/05/2019    Patient Active Problem List   Diagnosis Date Noted   Acute on chronic systolic CHF (congestive heart failure) ([place]) 12/03/2019   Diabetes mellitus without complication ([place])    COPD with acute exacerbation ([place]) 12/12/2018   Acute respiratory failure with hypoxia ([place]) 11/29/2018   Influenza A 11/29/2018   Hyperlipidemia, unspecified 11/29/2017   Hypertension 11/29/2017   Schizophrenia ([place]) 11/16/2017   COPD (chronic obstructive pulmonary disease) ([place]) 11/15/2017   Mental health disorder 11/15/2017   Cerebral ischemia 11/15/2017   Cerebral infarct ([place]) 11/15/2017   Benign essential HTN 11/13/2017   Diabetes type 2, controlled ([place]) 11/13/2017   COPD exacerbation ([place]) 09/16/2017    Past Surgical History:  Procedure Laterality Date   NO PAST SURGERIES      Prior to Admission medications   Medication Sig Start Date End Date Taking? Authorizing Provider   acetaminophen (TYLENOL) 500 MG tablet Take 500 mg by mouth every 4 (four) hours as needed.    [provider]  albuterol (VENTOLIN HFA) 108 (90 Base) MCG/ACT inhaler Inhale 2 puffs into the lungs every 6 (six) hours as needed for wheezing or shortness of breath. 07/08/19   Blake Divine, MD  amiodarone (PACERONE) 200 MG tablet Take 1 tablet (200 mg total) by mouth daily. 12/11/19   Jennye Boroughs, MD  Cholecalciferol (VITAMIN D3) 5000 units CAPS Take by mouth daily.     [provider]  dapagliflozin propanediol (FARXIGA) 5 MG TABS tablet Take 5 mg by mouth daily.    [provider]  dextrose (GLUTOSE) 40 % GEL Take by mouth once as needed for low blood sugar. For blood sugar <70    [provider]  ezetimibe (ZETIA) 10 MG tablet Take 10 mg by mouth daily.    [provider]  fluticasone (FLONASE) 50 MCG/ACT nasal spray Place 1 spray into both nostrils daily.    [provider]  formoterol (PERFOROMIST) 20 MCG/2ML nebulizer solution Take 20 mcg by nebulization 2 (two) times daily.    [provider]  Glycopyrrolate (LONHALA MAGNAIR STARTER KIT) 25 MCG/ML SOLN Inhale 25 mcg into the lungs 2 (two) times daily.    [provider]  haloperidol decanoate (HALDOL DECANOATE) 100 MG/ML injection Inject 90 mg into the muscle every 28 (twenty-eight) days.     [provider]  Insulin Glargine-Lixisenatide (SOLIQUA) 100-33 UNT-MCG/ML SOPN Inject 18 Units into the skin daily.    [provider]  ipratropium-albuterol (DUONEB) 0.5-2.5 (3) MG/3ML SOLN  Take 3 mLs by nebulization 4 (four) times daily.    [provider]  isosorbide-hydrALAZINE (BIDIL) 20-37.5 MG tablet Take 1 tablet by mouth 3 (three) times daily.    [provider]  LORazepam (ATIVAN) 0.5 MG tablet Take 0.5 mg by mouth every 4 (four) hours as needed (agitation).    [provider]  metoprolol succinate (TOPROL-XL) 100 MG 24 hr  tablet Take 1 tablet (100 mg total) by mouth daily. Take with or immediately following a meal. Patient taking differently: Take 25 mg by mouth daily. Take with or immediately following a meal. 02/17/20 06/17/20  Darylene Price A, FNP  potassium chloride SA (KLOR-CON) 20 MEQ tablet TAKE 1/2 TABLET (10 MEQ TOTAL) BY MOUTH DAILY 02/25/21   Darylene Price A, FNP  predniSONE (DELTASONE) 20 MG tablet Take 20 mg by mouth daily.    [provider]  rosuvastatin (CRESTOR) 40 MG tablet Take 40 mg by mouth daily.    [provider]  sacubitril-valsartan (ENTRESTO) 97-103 MG Take 1 tablet by mouth 2 (two) times daily. 08/04/19   [name], FNP  spironolactone (ALDACTONE) 25 MG tablet Take 0.5 tablets (12.5 mg total) by mouth daily. Patient taking differently: Take 25 mg by mouth daily. 08/28/19 06/17/20  [name], FNP  torsemide (DEMADEX) 20 MG tablet TAKE 1 TABLET BY MOUTH DAILY 03/26/21   [name], FNP    Allergies Asa [aspirin]  Family History  Problem Relation Age of Onset   CVA Mother    CAD Father     Social History Social History   Tobacco Use   Smoking status: Former    Packs/day: 1.00    Years: 35.00    Pack years: 35.00    Types: Cigarettes    Start date: 09/07/2019   Smokeless tobacco: Never  Vaping Use   Vaping Use: Never used  Substance Use Topics   Alcohol use: No   Drug use: No    Review of Systems  Constitutional: No fever/chills Eyes: No visual changes. ENT: No sore throat. Cardiovascular: Denies chest pain. Respiratory: Denies shortness of breath. Gastrointestinal: No abdominal pain.  No nausea, no vomiting.  No diarrhea.  No constipation. Genitourinary: Negative for dysuria. Musculoskeletal: Negative for back pain. Skin: Negative for rash. Neurological: Negative for headaches, focal weakness or numbness. ____________________________________________   PHYSICAL EXAM:  VITAL SIGNS: ED Triage Vitals  Enc Vitals Group     BP  06/02/21 1129 (!) 156/97     Pulse Rate 06/02/21 1129 (!) 40     Resp 06/02/21 1129 18     Temp 06/02/21 1129 98.3 F (36.8 C)     Temp Source 06/02/21 1129 Oral     SpO2 06/02/21 1129 100 %     Weight 06/02/21 1130 202 lb (91.6 kg)     Height 06/02/21 1130 6' (1.829 m)     Head Circumference --      Peak Flow --      Pain Score 06/02/21 1129 0     Pain Loc --      Pain Edu? --      Excl. in [place]? --     Constitutional: Alert and oriented. Well appearing and in no acute distress. Eyes: Conjunctivae are normal.  Head: Atraumatic. Nose: No congestion/rhinnorhea. Mouth/Throat: Mucous membranes are moist.  Oropharynx non-erythematous. Neck: No stridor.   Hematological/Lymphatic/Immunilogical: No cervical lymphadenopathy. Cardiovascular: Normal rate, regular rhythm. Grossly normal heart sounds.  Good peripheral circulation. Respiratory: Normal respiratory effort.  No retractions. Lungs CTAB. Gastrointestinal: Soft and nontender. No distention. No abdominal bruits.  Musculoskeletal: No lower extremity tenderness nor edema.  No joint effusions. Neurologic:  Normal speech and language. No gross focal neurologic deficits are appreciated. No gait instability. Skin:  Skin is warm, dry and intact. No rash noted. Psychiatric: Mood and affect are elevated   ____________________________________________   LABS (all labs ordered are listed, but only abnormal results are displayed)  Labs Reviewed  CBC - Abnormal; Notable for the following components:      Result Value   RDW 17.8 (*)    All other components within normal limits  BASIC METABOLIC PANEL  TROPONIN I (HIGH SENSITIVITY)  TROPONIN I (HIGH SENSITIVITY)   ____________________________________________  EKG  ED ECG REPORT I, Aldrin Engelhard, FNP-BC personally viewed and interpreted this ECG.   Date: 06/02/2021  EKG Time: 1333  Rate: 88  Rhythm: sinus bradycardia, frequent PVC's noted  Axis: normal  Intervals:none  ST&T  Change: no ST elevation  ____________________________________________  RADIOLOGY  ED MD interpretation:    No cardiopulmonary abnormality.  I, Sherrie George, personally viewed and evaluated these images (plain radiographs) as part of my medical decision making, as well as reviewing the written report by the radiologist.  Official radiology report(s): DG Chest 2 View  Result Date: 06/02/2021 CLINICAL DATA:  Bradycardia EXAM: CHEST - 2 VIEW COMPARISON:  12/08/2019 FINDINGS: The heart size and mediastinal contours are within normal limits. Both lungs are clear. No pleural effusion or pneumothorax. The visualized skeletal structures are unremarkable. IMPRESSION: No acute process in the chest. Electronically Signed   By: Macy Mis M.D.   On: 06/02/2021 12:21    ____________________________________________   PROCEDURES  Procedure(s) performed (including Critical Care):  Procedures  ____________________________________________   INITIAL IMPRESSION / ASSESSMENT AND PLAN     55 year old male presenting to the emergency department for treatment and evaluation of bradycardia.  See HPI for further details.    ED COURSE  EKG and monitoring do not show bradycardia. Patient denies symptoms of concern. Labs and chest x-ray are all reassuring. He is stable for discharge. He will be advised to follow up with cardiology/primary care or return to the ER for symptoms of concern.    ___________________________________________   FINAL CLINICAL IMPRESSION(S) / ED DIAGNOSES  Final diagnoses:  Irregular heart rate     ED Discharge Orders     None        Paul Hernandez was evaluated in Emergency Department on 06/02/2021 for the symptoms described in the history of present illness. He was evaluated in the context of the global COVID-19 pandemic, which necessitated consideration that the patient might be at risk for infection with the SARS-CoV-2 virus that causes COVID-19.  Institutional protocols and algorithms that pertain to the evaluation of patients at risk for COVID-19 are in a state of rapid change based on information released by regulatory bodies including the CDC and federal and state organizations. These policies and algorithms were followed during the patient's care in the ED.   Note:  This document was prepared using Dragon voice recognition software and may include unintentional dictation errors.    Victorino Dike, FNP 06/02/21 1438    Merlyn Lot, MD 06/02/21 1501

## 2021-06-02 NOTE — Discharge Instructions (Addendum)
Heart rate and EKG do not show bradycardia.  Follow up with primary care or cardiology.  Return to the ER for chest pain, palpitations, or other concerns if unable to see PCP.

## 2021-06-12 NOTE — Progress Notes (Signed)
Patient ID: Paul Hernandez, male    DOB: 1966/10/18, 55 y.o.   MRN: [id number]  HPI  Paul Hernandez is a 55 y/o male with a history of HTN, DM, COPD, schizophrenia, current tobacco use and chronic heart failure.   Echo report from 12/04/19 reviewed and showed an EF of 20-25%. Echo report from 11/30/2018 reviewed and showed an EF of 20-25%.  Was in the ED 06/02/21 due to bradycardia at PCP office. Labs and exam normal and EKG did not show bradycardia so he was released.   He presents today for with a chief complaint of a follow-up visit. He currently doesn't have any complaints and specifically denies any difficulty sleeping, dizziness, abdominal distention, palpitations, pedal edema, chest pain, wheezing, shortness of breath, cough, fatigue or weight gain.   Says that the scales at the group home broke but they are supposed to getting new scales for him.   Past Medical History:  Diagnosis Date   CHF (congestive heart failure) ([place]) 12/05/2019   COPD (chronic obstructive pulmonary disease) ([place]) 12/05/2019   Diabetes mellitus without complication ([place]) 34/74/2595   Hypertension 12/05/2019   Mental health disorder 12/05/2019   Past Surgical History:  Procedure Laterality Date   NO PAST SURGERIES     Family History  Problem Relation Age of Onset   CVA Mother    CAD Father    Social History   Tobacco Use   Smoking status: Former    Packs/day: 1.00    Years: 35.00    Pack years: 35.00    Types: Cigarettes    Start date: 09/07/2019   Smokeless tobacco: Never  Substance Use Topics   Alcohol use: No   Allergies  Allergen Reactions   Asa [Aspirin]    Prior to Admission medications   Medication Sig Start Date End Date Taking? Authorizing Provider  acetaminophen (TYLENOL) 500 MG tablet Take 500 mg by mouth every 4 (four) hours as needed.   Yes [provider]  albuterol (VENTOLIN HFA) 108 (90 Base) MCG/ACT inhaler Inhale 2 puffs into the lungs every 6 (six) hours as needed  for wheezing or shortness of breath. 07/08/19  Yes Blake Divine, MD  amiodarone (PACERONE) 200 MG tablet Take 1 tablet (200 mg total) by mouth daily. 12/11/19  Yes Jennye Boroughs, MD  Cholecalciferol (VITAMIN D3) 5000 units CAPS Take by mouth daily.    Yes [provider]  dapagliflozin propanediol (FARXIGA) 5 MG TABS tablet Take 5 mg by mouth daily.   Yes [provider]  dextrose (GLUTOSE) 40 % GEL Take by mouth once as needed for low blood sugar. For blood sugar <70   Yes [provider]  ezetimibe (ZETIA) 10 MG tablet Take 10 mg by mouth daily.   Yes [provider]  ferrous sulfate 325 (65 FE) MG tablet Take 325 mg by mouth daily with breakfast.   Yes [provider]  Finerenone (KERENDIA) 10 MG TABS Take 10 mg by mouth daily.   Yes [provider]  fluticasone (FLONASE) 50 MCG/ACT nasal spray Place 1 spray into both nostrils daily.   Yes [provider]  formoterol (PERFOROMIST) 20 MCG/2ML nebulizer solution Take 20 mcg by nebulization 2 (two) times daily.   Yes [provider]  Glycopyrrolate (LONHALA MAGNAIR STARTER KIT) 25 MCG/ML SOLN Inhale 25 mcg into the lungs 2 (two) times daily.   Yes [provider]  haloperidol decanoate (HALDOL DECANOATE) 100 MG/ML injection Inject 90 mg into the muscle every 28 (  twenty-eight) days.    Yes [provider]  ipratropium-albuterol (DUONEB) 0.5-2.5 (3) MG/3ML SOLN Take 3 mLs by nebulization 4 (four) times daily.   Yes [provider]  isosorbide-hydrALAZINE (BIDIL) 20-37.5 MG tablet Take 1 tablet by mouth 3 (three) times daily.   Yes [provider]  liraglutide (VICTOZA) 18 MG/3ML SOPN Inject 0.6 mg into the skin daily.   Yes [provider]  metFORMIN (GLUCOPHAGE) 500 MG tablet Take 500 mg by mouth 2 (two) times daily with a meal.   Yes [provider]  metoprolol succinate (TOPROL-XL) 25 MG 24 hr tablet Take 25 mg by mouth  daily.   Yes [provider]  potassium chloride SA (KLOR-CON) 20 MEQ tablet TAKE 1/2 TABLET (10 MEQ TOTAL) BY MOUTH DAILY 02/25/21  Yes Darylene Price A, FNP  predniSONE (DELTASONE) 20 MG tablet Take 20 mg by mouth daily.   Yes [provider]  rosuvastatin (CRESTOR) 40 MG tablet Take 40 mg by mouth daily.   Yes [provider]  sacubitril-valsartan (ENTRESTO) 97-103 MG Take 1 tablet by mouth 2 (two) times daily. 08/04/19  Yes Donyale Berthold, [name], FNP  spironolactone (ALDACTONE) 25 MG tablet Take 0.5 tablets (12.5 mg total) by mouth daily. Patient taking differently: Take 25 mg by mouth daily. 08/28/19  Yes Darylene Price A, FNP  torsemide (DEMADEX) 20 MG tablet TAKE 1 TABLET BY MOUTH DAILY 03/26/21  Yes Darylene Price A, FNP  vitamin B-12 (CYANOCOBALAMIN) 1000 MCG tablet Take 1,000 mcg by mouth daily.   Yes [provider]  LORazepam (ATIVAN) 0.5 MG tablet Take 0.5 mg by mouth every 4 (four) hours as needed (agitation).    [provider]    Review of Systems  Constitutional:  Negative for appetite change, fatigue and fever.  HENT:  Negative for congestion, rhinorrhea and sore throat.   Eyes: Negative.   Respiratory:  Negative for cough, chest tightness, shortness of breath and wheezing.   Cardiovascular:  Negative for chest pain, palpitations and leg swelling.  Gastrointestinal:  Negative for abdominal distention and abdominal pain.  Endocrine: Negative.   Genitourinary: Negative.   Musculoskeletal:  Negative for arthralgias and back pain.  Skin:  Negative for rash and wound.  Allergic/Immunologic: Negative.   Neurological:  Negative for dizziness and light-headedness.  Hematological:  Negative for adenopathy. Does not bruise/bleed easily.  Psychiatric/Behavioral:  Negative for dysphoric mood and sleep disturbance (sleeps with 2 pillows). The patient is not nervous/anxious.    Vitals:   06/14/21 0919  BP: 101/86  Pulse: 93  Resp: 18  SpO2: 100%   Weight: 205 lb 2 oz (93 kg)  Height: 6' (1.829 m)   Wt Readings from Last 3 Encounters:  06/14/21 205 lb 2 oz (93 kg)  06/02/21 202 lb (91.6 kg)  12/14/20 214 lb 2 oz (97.1 kg)   Lab Results  Component Value Date   CREATININE 1.21 06/02/2021   CREATININE 1.78 (H) 03/18/2020   CREATININE 0.80 12/10/2019    Physical Exam Vitals and nursing note reviewed. Exam conducted with a chaperone present (staff member).  Constitutional:      Appearance: Normal appearance.  HENT:     Head: Normocephalic and atraumatic.  Cardiovascular:     Rate and Rhythm: Normal rate and regular rhythm.  Pulmonary:     Effort: Pulmonary effort is normal.     Breath sounds: No wheezing, rhonchi or rales.  Abdominal:     General: There is no distension.  Palpations: Abdomen is soft.  Musculoskeletal:        General: No tenderness.     Cervical back: Normal range of motion and neck supple.     Right lower leg: No edema.     Left lower leg: No edema.  Skin:    General: Skin is warm and dry.  Neurological:     General: No focal deficit present.     Mental Status: He is alert and oriented to person, place, and time.  Psychiatric:        Mood and Affect: Mood normal.        Behavior: Behavior normal.   Assessment & Plan:  1: Chronic heart failure with reduced ejection fraction- - NYHA class I - euvolemic today - was weighing daily until group home scale broke; waiting for replacement; reminded to call for an overnight weight gain of >2 pounds or a weekly weight gain of >5 pounds - weight down 9 pounds from last visit here 6 months ago - not adding salt to his food - saw cardiology Clayborn Bigness) 05/01/18 - verquvo contraindicated due to patient being on nitrates (bidil) - on GDMT of farxiga, metoprolol, entresto and spironolactone - higher dose of metoprolol caused bradycardia - BP may not tolerate titration entresto - BNP 12/08/19 was 2195.0  2: HTN- - BP looks good today (101/86) - sees PCP  ([facility] at [facility] group home - BMP on 06/02/21 reviewed and showed sodium 138, potassium 4.3, creatinine 1.21 and GFR >60  3: DM- - A1c 04/27/20 was 7.4% - glucose is checked daily at the group home - saw nephrology Percell Miller) 04/05/21  4: Schizophrenia- - living at a group home called [facility] - patient cooperative   5: COPD- - saw pulmonology Ashby Dawes) 11/29/17 - smokes ~ 1/2 ppd cigarettes  - complete cessation discussed for 3 minutes with him   Facility medication list was reviewed.  Return in 6 months or sooner for any questions/problems before then.

## 2021-06-14 ENCOUNTER — Ambulatory Visit: Payer: Medicare Other | Attending: Family | Admitting: Family

## 2021-06-14 ENCOUNTER — Encounter: Payer: Self-pay | Admitting: Family

## 2021-06-14 ENCOUNTER — Other Ambulatory Visit: Payer: Self-pay

## 2021-06-14 VITALS — BP 101/86 | HR 93 | Resp 18 | Ht 72.0 in | Wt 205.1 lb

## 2021-06-14 DIAGNOSIS — J449 Chronic obstructive pulmonary disease, unspecified: Secondary | ICD-10-CM | POA: Insufficient documentation

## 2021-06-14 DIAGNOSIS — Z7984 Long term (current) use of oral hypoglycemic drugs: Secondary | ICD-10-CM | POA: Insufficient documentation

## 2021-06-14 DIAGNOSIS — F209 Schizophrenia, unspecified: Secondary | ICD-10-CM | POA: Diagnosis not present

## 2021-06-14 DIAGNOSIS — F203 Undifferentiated schizophrenia: Secondary | ICD-10-CM

## 2021-06-14 DIAGNOSIS — I110 Hypertensive heart disease with heart failure: Secondary | ICD-10-CM | POA: Diagnosis not present

## 2021-06-14 DIAGNOSIS — J42 Unspecified chronic bronchitis: Secondary | ICD-10-CM

## 2021-06-14 DIAGNOSIS — Z8249 Family history of ischemic heart disease and other diseases of the circulatory system: Secondary | ICD-10-CM | POA: Insufficient documentation

## 2021-06-14 DIAGNOSIS — I5022 Chronic systolic (congestive) heart failure: Secondary | ICD-10-CM

## 2021-06-14 DIAGNOSIS — F17210 Nicotine dependence, cigarettes, uncomplicated: Secondary | ICD-10-CM | POA: Diagnosis not present

## 2021-06-14 DIAGNOSIS — E119 Type 2 diabetes mellitus without complications: Secondary | ICD-10-CM

## 2021-06-14 DIAGNOSIS — Z886 Allergy status to analgesic agent status: Secondary | ICD-10-CM | POA: Diagnosis not present

## 2021-06-14 DIAGNOSIS — Z79899 Other long term (current) drug therapy: Secondary | ICD-10-CM | POA: Diagnosis not present

## 2021-06-14 DIAGNOSIS — Z7901 Long term (current) use of anticoagulants: Secondary | ICD-10-CM | POA: Diagnosis not present

## 2021-06-14 DIAGNOSIS — I10 Essential (primary) hypertension: Secondary | ICD-10-CM

## 2021-06-14 DIAGNOSIS — Z7952 Long term (current) use of systemic steroids: Secondary | ICD-10-CM | POA: Diagnosis not present

## 2021-06-14 NOTE — Patient Instructions (Signed)
Continue weighing daily and call for an overnight weight gain of > 2 pounds or a weekly weight gain of >5 pounds. 

## 2021-07-27 ENCOUNTER — Other Ambulatory Visit: Payer: Self-pay | Admitting: Family

## 2021-09-02 LAB — COLOGUARD: COLOGUARD: NEGATIVE

## 2021-09-02 LAB — EXTERNAL GENERIC LAB PROCEDURE: COLOGUARD: NEGATIVE

## 2021-12-13 ENCOUNTER — Ambulatory Visit: Payer: Medicare Other | Attending: Family | Admitting: Family

## 2021-12-13 ENCOUNTER — Encounter: Payer: Self-pay | Admitting: Family

## 2021-12-13 ENCOUNTER — Other Ambulatory Visit: Payer: Self-pay

## 2021-12-13 VITALS — BP 120/81 | HR 93 | Resp 16 | Ht 72.0 in | Wt 221.1 lb

## 2021-12-13 DIAGNOSIS — F203 Undifferentiated schizophrenia: Secondary | ICD-10-CM

## 2021-12-13 DIAGNOSIS — Z7901 Long term (current) use of anticoagulants: Secondary | ICD-10-CM | POA: Insufficient documentation

## 2021-12-13 DIAGNOSIS — Z7984 Long term (current) use of oral hypoglycemic drugs: Secondary | ICD-10-CM | POA: Diagnosis not present

## 2021-12-13 DIAGNOSIS — E119 Type 2 diabetes mellitus without complications: Secondary | ICD-10-CM

## 2021-12-13 DIAGNOSIS — I110 Hypertensive heart disease with heart failure: Secondary | ICD-10-CM | POA: Insufficient documentation

## 2021-12-13 DIAGNOSIS — J449 Chronic obstructive pulmonary disease, unspecified: Secondary | ICD-10-CM

## 2021-12-13 DIAGNOSIS — F209 Schizophrenia, unspecified: Secondary | ICD-10-CM | POA: Diagnosis not present

## 2021-12-13 DIAGNOSIS — N2889 Other specified disorders of kidney and ureter: Secondary | ICD-10-CM | POA: Diagnosis not present

## 2021-12-13 DIAGNOSIS — Z79899 Other long term (current) drug therapy: Secondary | ICD-10-CM | POA: Diagnosis not present

## 2021-12-13 DIAGNOSIS — I5022 Chronic systolic (congestive) heart failure: Secondary | ICD-10-CM | POA: Diagnosis present

## 2021-12-13 DIAGNOSIS — I10 Essential (primary) hypertension: Secondary | ICD-10-CM

## 2021-12-13 DIAGNOSIS — F17210 Nicotine dependence, cigarettes, uncomplicated: Secondary | ICD-10-CM | POA: Diagnosis not present

## 2021-12-13 DIAGNOSIS — Z9981 Dependence on supplemental oxygen: Secondary | ICD-10-CM | POA: Diagnosis not present

## 2021-12-13 NOTE — Patient Instructions (Signed)
You are doing great with your heart failure!!!!!  Call the nephrologist, Dr. Helaine Chess office, let them know you have not heard from the Urologist yet. You have a card with the number in his binder.  Return in 6 months.

## 2021-12-13 NOTE — Progress Notes (Signed)
Patient ID: Paul Hernandez, male    DOB: 1966/04/20, 56 y.o.   MRN: [id number]  HPI  Paul Hernandez is a 56 y/o male with a history of HTN, DM, COPD, schizophrenia, current tobacco use and chronic heart failure.   Echo report from 12/04/19 reviewed and showed an EF of 20-25%. Echo report from 11/30/2018 reviewed and showed an EF of 20-25%.  Has not been admitted or been in the ED in the last 6 months.   He presents today for with a chief complaint of a follow-up visit. He currently doesn't have any complaints and specifically denies any difficulty sleeping, dizziness, abdominal distention, palpitations, pedal edema, chest pain, wheezing, shortness of breath, cough, fatigue or sudden weight gain.   He is weighing daily at the group home, as well as checking his BP and BS daily.  Log provided for weight, steady weight gain since last visit but appears to be to increased caloric intake.   BP range 105's-120's/70-80's.  BS 80-120's.  Past Medical History:  Diagnosis Date   CHF (congestive heart failure) ([place]) 12/05/2019   COPD (chronic obstructive pulmonary disease) ([place]) 12/05/2019   Diabetes mellitus without complication ([place]) 69/62/9528   Hypertension 12/05/2019   Mental health disorder 12/05/2019   Past Surgical History:  Procedure Laterality Date   NO PAST SURGERIES     Family History  Problem Relation Age of Onset   CVA Mother    CAD Father    Social History   Tobacco Use   Smoking status: Former    Packs/day: 1.00    Years: 35.00    Pack years: 35.00    Types: Cigarettes    Start date: 09/07/2019   Smokeless tobacco: Never  Substance Use Topics   Alcohol use: No   Allergies  Allergen Reactions   Asa [Aspirin]    Prior to Admission medications   Medication Sig Start Date End Date Taking? Authorizing Provider  acetaminophen (TYLENOL) 500 MG tablet Take 500 mg by mouth every 4 (four) hours as needed.   Yes [provider]  albuterol (VENTOLIN HFA) 108 (90  Base) MCG/ACT inhaler Inhale 2 puffs into the lungs every 6 (six) hours as needed for wheezing or shortness of breath. 07/08/19  Yes Blake Divine, MD  amiodarone (PACERONE) 200 MG tablet Take 1 tablet (200 mg total) by mouth daily. 12/11/19  Yes Jennye Boroughs, MD  Cholecalciferol (VITAMIN D3) 5000 units CAPS Take by mouth daily.    Yes [provider]  dapagliflozin propanediol (FARXIGA) 5 MG TABS tablet Take 5 mg by mouth daily.   Yes [provider]  dextrose (GLUTOSE) 40 % GEL Take by mouth once as needed for low blood sugar. For blood sugar <70   Yes [provider]  ezetimibe (ZETIA) 10 MG tablet Take 10 mg by mouth daily.   Yes [provider]  ferrous sulfate 325 (65 FE) MG tablet Take 325 mg by mouth daily with breakfast.   Yes [provider]  Finerenone (KERENDIA) 10 MG TABS Take 10 mg by mouth daily.   Yes [provider]  fluticasone (FLONASE) 50 MCG/ACT nasal spray Place 1 spray into both nostrils daily.   Yes [provider]  formoterol (PERFOROMIST) 20 MCG/2ML nebulizer solution Take 20 mcg by nebulization 2 (two) times daily.   Yes [provider]  Glycopyrrolate (LONHALA MAGNAIR STARTER KIT) 25 MCG/ML SOLN Inhale 25 mcg into the lungs 2 (two) times daily.   Yes [provider]  haloperidol  decanoate (HALDOL DECANOATE) 100 MG/ML injection Inject 90 mg into the muscle every 28 (twenty-eight) days.    Yes [provider]  ipratropium-albuterol (DUONEB) 0.5-2.5 (3) MG/3ML SOLN Take 3 mLs by nebulization 4 (four) times daily.   Yes [provider]  isosorbide-hydrALAZINE (BIDIL) 20-37.5 MG tablet Take 1 tablet by mouth 3 (three) times daily.   Yes [provider]  liraglutide (VICTOZA) 18 MG/3ML SOPN Inject 0.6 mg into the skin daily.   Yes [provider]  metFORMIN (GLUCOPHAGE) 500 MG tablet Take 500 mg by mouth 2 (two) times daily with a meal.   Yes [provider]  metoprolol succinate (TOPROL-XL) 25 MG 24 hr tablet Take 25 mg by mouth daily.   Yes [provider]  potassium chloride SA (KLOR-CON) 20 MEQ tablet TAKE 1/2 TABLET (10 MEQ TOTAL) BY MOUTH DAILY 02/25/21  Yes Darylene Price A, FNP  predniSONE (DELTASONE) 20 MG tablet Take 20 mg by mouth daily.   Yes [provider]  rosuvastatin (CRESTOR) 40 MG tablet Take 40 mg by mouth daily.   Yes [provider]  sacubitril-valsartan (ENTRESTO) 97-103 MG Take 1 tablet by mouth 2 (two) times daily. 08/04/19  Yes [name], [name], FNP  spironolactone (ALDACTONE) 25 MG tablet Take 0.5 tablets (12.5 mg total) by mouth daily. Patient taking differently: Take 25 mg by mouth daily. 08/28/19  Yes Darylene Price A, FNP  torsemide (DEMADEX) 20 MG tablet TAKE 1 TABLET BY MOUTH DAILY 03/26/21  Yes Darylene Price A, FNP  vitamin B-12 (CYANOCOBALAMIN) 1000 MCG tablet Take 1,000 mcg by mouth daily.   Yes [provider]  LORazepam (ATIVAN) 0.5 MG tablet Take 0.5 mg by mouth every 4 (four) hours as needed (agitation).    [provider]    Review of Systems  Constitutional:  Negative for appetite change, fatigue and fever.  HENT:  Negative for congestion, rhinorrhea and sore throat.   Eyes: Negative.   Respiratory:  Negative for cough, chest tightness, shortness of breath and wheezing.   Cardiovascular:  Negative for chest pain, palpitations and leg swelling.  Gastrointestinal:  Negative for abdominal distention and abdominal pain.  Endocrine: Negative.   Genitourinary: Negative.   Musculoskeletal:  Negative for arthralgias and back pain.  Skin:  Negative for rash and wound.  Allergic/Immunologic: Negative.   Neurological:  Negative for dizziness and light-headedness.  Hematological:  Negative for adenopathy. Does not bruise/bleed easily.  Psychiatric/Behavioral:  Negative for dysphoric mood and sleep disturbance (sleeps with 2 pillows). The patient is not  nervous/anxious.    Vitals:   12/13/21 0943  BP: 120/81  Pulse: 93  Resp: 16  SpO2: 97%  Weight: 221 lb 2 oz (100.3 kg)  Height: 6' (1.829 m)   Wt Readings from Last 3 Encounters:  12/13/21 221 lb 2 oz (100.3 kg)  06/14/21 205 lb 2 oz (93 kg)  06/02/21 202 lb (91.6 kg)    Lab Results  Component Value Date   CREATININE 1.21 06/02/2021   CREATININE 1.78 (H) 03/18/2020   CREATININE 0.80 12/10/2019    Physical Exam Vitals and nursing note reviewed. Exam conducted with a chaperone present (staff member).  Constitutional:      Appearance: Normal appearance.  HENT:     Head: Normocephalic and atraumatic.  Cardiovascular:     Rate and Rhythm: Normal rate and regular rhythm.  Pulmonary:     Effort: Pulmonary effort is normal.     Breath sounds: No wheezing, rhonchi or rales.  Abdominal:     General: There is no distension.     Palpations: Abdomen is soft.  Musculoskeletal:        General: No tenderness.     Cervical back: Normal range of motion and neck supple.     Right lower leg: No edema.     Left lower leg: No edema.  Skin:    General: Skin is warm and dry.  Neurological:     General: No focal deficit present.     Mental Status: He is alert and oriented to person, place, and time.  Psychiatric:        Mood and Affect: Mood normal.        Behavior: Behavior normal.   Assessment & Plan:  1: Chronic heart failure with reduced ejection fraction- - NYHA class I - euvolemic today - weighing daily at group home - weight up 16 lbs from his last visit here 6 months ago - not adding salt to his food - will see cardiology Clayborn Bigness) 03/09/22 - verquvo contraindicated due to patient being on nitrates (bidil) - on GDMT of farxiga, metoprolol, entresto and spironolactone - BNP 12/08/19 was 2195.0  2: HTN- - BP 120/81 - sees PCP at [facility]) - BMP on 10/21/21 reviewed and showed sodium 138, potassium 4.3, creatinine 1.17 and GFR 75  3: DM- - A1c  04/27/20 was 7.4%; patient and staff member not sure of recent A1c - glucose is checked daily at the group home, log shows 80-120's  4: Renal mass - evaluated by nephrology, referred to urology and they have not heard from urology yet - advised to call nephrology today to let them know that urology has not reached out yet  5: Schizophrenia- - living at a group home called [facility]  6: COPD- - saw pulmonology Ashby Dawes) 11/29/17 - smokes ~ 1/2 ppd cigarettes  - complete cessation discussed for 3 minutes with him - wears O2 HS   Facility medication list was reviewed.  Return in 6 months or sooner for any questions/problems before then.

## 2022-06-13 ENCOUNTER — Ambulatory Visit: Payer: Medicare Other | Admitting: Family

## 2022-06-20 ENCOUNTER — Encounter: Payer: Self-pay | Admitting: Family

## 2022-06-20 ENCOUNTER — Ambulatory Visit: Payer: Medicare HMO | Attending: Family | Admitting: Family

## 2022-06-20 VITALS — BP 98/75 | HR 81 | Resp 20 | Ht 71.0 in | Wt 214.5 lb

## 2022-06-20 DIAGNOSIS — F17210 Nicotine dependence, cigarettes, uncomplicated: Secondary | ICD-10-CM | POA: Insufficient documentation

## 2022-06-20 DIAGNOSIS — I110 Hypertensive heart disease with heart failure: Secondary | ICD-10-CM | POA: Insufficient documentation

## 2022-06-20 DIAGNOSIS — I5022 Chronic systolic (congestive) heart failure: Secondary | ICD-10-CM

## 2022-06-20 DIAGNOSIS — J449 Chronic obstructive pulmonary disease, unspecified: Secondary | ICD-10-CM | POA: Insufficient documentation

## 2022-06-20 DIAGNOSIS — E119 Type 2 diabetes mellitus without complications: Secondary | ICD-10-CM | POA: Insufficient documentation

## 2022-06-20 DIAGNOSIS — N2889 Other specified disorders of kidney and ureter: Secondary | ICD-10-CM

## 2022-06-20 DIAGNOSIS — I10 Essential (primary) hypertension: Secondary | ICD-10-CM

## 2022-06-20 DIAGNOSIS — Z7984 Long term (current) use of oral hypoglycemic drugs: Secondary | ICD-10-CM | POA: Insufficient documentation

## 2022-06-20 DIAGNOSIS — F203 Undifferentiated schizophrenia: Secondary | ICD-10-CM

## 2022-06-20 DIAGNOSIS — F209 Schizophrenia, unspecified: Secondary | ICD-10-CM | POA: Diagnosis not present

## 2022-06-20 DIAGNOSIS — I509 Heart failure, unspecified: Secondary | ICD-10-CM | POA: Diagnosis present

## 2022-06-20 NOTE — Patient Instructions (Signed)
Continue weighing daily and call for an overnight weight gain of 3 pounds or more or a weekly weight gain of more than 5 pounds.   Pulmonology referral has been placed and they will call you

## 2022-06-20 NOTE — Progress Notes (Signed)
Patient ID: Paul Hernandez, male    DOB: 06-23-1966, 56 y.o.   MRN: [id number]  HPI  Paul Hernandez is a 56 y/o male with a history of HTN, DM, COPD, schizophrenia, current tobacco use and chronic heart failure.   Echo report from 05/09/22 reviewed and showed an EF of 45% along with mild LVH. Echo report from 12/04/19 reviewed and showed an EF of 20-25%. Echo report from 11/30/2018 reviewed and showed an EF of 20-25%.  Has not been admitted or been in the ED in the last 6 months.   He presents today for with a chief complaint of a follow-up visit. He currently voices not complaints and specifically denies any fatigue, shortness of breath, cough, wheezing, chest pain, pedal edema, palpitations, abdominal distention, dizziness, weight gain or difficulty sleeping.   Continues to smoke 1/2 ppd. Overall he feels "great"  Past Medical History:  Diagnosis Date   CHF (congestive heart failure) ([place]) 12/05/2019   COPD (chronic obstructive pulmonary disease) ([place]) 12/05/2019   Diabetes mellitus without complication ([place]) 37/34/2876   Hypertension 12/05/2019   Mental health disorder 12/05/2019   Past Surgical History:  Procedure Laterality Date   NO PAST SURGERIES     Family History  Problem Relation Age of Onset   CVA Mother    CAD Father    Social History   Tobacco Use   Smoking status: Some Days    Packs/day: 1.00    Years: 35.00    Total pack years: 35.00    Types: Cigarettes    Start date: 09/07/2019   Smokeless tobacco: Never  Substance Use Topics   Alcohol use: No   Allergies  Allergen Reactions   Asa [Aspirin]    Prior to Admission medications   Medication Sig Start Date End Date Taking? Authorizing Provider  acetaminophen (TYLENOL) 500 MG tablet Take 500 mg by mouth every 4 (four) hours as needed.   Yes [provider]  albuterol (VENTOLIN HFA) 108 (90 Base) MCG/ACT inhaler Inhale 2 puffs into the lungs every 6 (six) hours as needed for wheezing or shortness of  breath. 07/08/19  Yes Blake Divine, MD  amiodarone (PACERONE) 200 MG tablet Take 1 tablet (200 mg total) by mouth daily. 12/11/19  Yes Jennye Boroughs, MD  benztropine (COGENTIN) 2 MG tablet Take 2 mg by mouth at bedtime as needed for tremors.   Yes [provider]  Cholecalciferol (VITAMIN D3) 5000 units CAPS Take by mouth daily.    Yes [provider]  dapagliflozin propanediol (FARXIGA) 5 MG TABS tablet Take 10 mg by mouth daily.   Yes [provider]  dextrose (GLUTOSE) 40 % GEL Take by mouth once as needed for low blood sugar. For blood sugar <70   Yes [provider]  diphenhydrAMINE (SOMINEX) 25 MG tablet Take 25 mg by mouth at bedtime as needed for sleep.   Yes [provider]  ezetimibe (ZETIA) 10 MG tablet Take 10 mg by mouth daily.   Yes [provider]  ferrous sulfate 325 (65 FE) MG tablet Take 325 mg by mouth daily with breakfast.   Yes [provider]  Finerenone (KERENDIA) 10 MG TABS Take 10 mg by mouth daily.   Yes [provider]  fluticasone (FLONASE) 50 MCG/ACT nasal spray Place 1 spray into both nostrils daily.   Yes [provider]  formoterol (PERFOROMIST) 20 MCG/2ML nebulizer solution Take 20 mcg by nebulization 2 (two) times daily.   Yes [provider]  Glycopyrrolate (LONHALA MAGNAIR STARTER KIT) 25 MCG/ML SOLN Inhale 25 mcg into the lungs 2 (two) times daily.   Yes [provider]  haloperidol decanoate (HALDOL DECANOATE) 100 MG/ML injection Inject 90 mg into the muscle every 28 (twenty-eight) days.    Yes [provider]  hydrALAZINE (APRESOLINE) 25 MG tablet Take 37.5 mg by mouth 3 (three) times daily.   Yes [provider]  ipratropium-albuterol (DUONEB) 0.5-2.5 (3) MG/3ML SOLN Take 3 mLs by nebulization 4 (four) times daily.   Yes [provider]  isosorbide-hydrALAZINE (BIDIL) 20-37.5 MG tablet Take 1 tablet by mouth 3 (three) times daily.    Yes [provider]  liraglutide (VICTOZA) 18 MG/3ML SOPN Inject 0.6 mg into the skin daily.   Yes [provider]  metFORMIN (GLUCOPHAGE) 500 MG tablet Take 500 mg by mouth 2 (two) times daily with a meal.   Yes [provider]  metoprolol succinate (TOPROL-XL) 25 MG 24 hr tablet Take 25 mg by mouth daily.   Yes [provider]  rosuvastatin (CRESTOR) 40 MG tablet Take 40 mg by mouth daily.   Yes [provider]  sacubitril-valsartan (ENTRESTO) 97-103 MG Take 1 tablet by mouth 2 (two) times daily. 08/04/19  Yes Darylene Price A, FNP  torsemide (DEMADEX) 20 MG tablet TAKE 1 TABLET BY MOUTH DAILY 03/26/21  Yes Darylene Price A, FNP  vitamin B-12 (CYANOCOBALAMIN) 1000 MCG tablet Take 1,000 mcg by mouth daily.   Yes [provider]  LORazepam (ATIVAN) 0.5 MG tablet Take 0.5 mg by mouth every 4 (four) hours as needed (agitation). Patient not taking: Reported on 12/13/2021    [provider]  potassium chloride SA (KLOR-CON) 20 MEQ tablet TAKE 1/2 TABLET (10 MEQ TOTAL) BY MOUTH DAILY Patient not taking: Reported on 12/13/2021 07/27/21   [name], FNP  predniSONE (DELTASONE) 20 MG tablet Take 20 mg by mouth daily. Patient not taking: Reported on 12/13/2021    [provider]  spironolactone (ALDACTONE) 25 MG tablet Take 0.5 tablets (12.5 mg total) by mouth daily. Patient not taking: Reported on 12/13/2021 08/28/19   [name], FNP   Review of Systems  Constitutional:  Negative for appetite change, fatigue and fever.  HENT:  Negative for congestion, rhinorrhea and sore throat.   Eyes: Negative.   Respiratory:  Negative for cough, chest tightness, shortness of breath and wheezing.   Cardiovascular:  Negative for chest pain, palpitations and leg swelling.  Gastrointestinal:  Negative for abdominal distention and abdominal pain.  Endocrine: Negative.   Genitourinary: Negative.   Musculoskeletal:  Negative for arthralgias and  back pain.  Skin:  Negative for rash and wound.  Allergic/Immunologic: Negative.   Neurological:  Negative for dizziness and light-headedness.  Hematological:  Negative for adenopathy. Does not bruise/bleed easily.  Psychiatric/Behavioral:  Negative for dysphoric mood and sleep disturbance (sleeps with 2 pillows). The patient is not nervous/anxious.    Vitals:   06/20/22 0856  BP: 98/75  Pulse: 81  Resp: 20  SpO2: 99%  Weight: 214 lb 8 oz (97.3 kg)  Height: 5' 11" (1.803 m)   Wt Readings from Last 3 Encounters:  06/20/22 214 lb 8 oz (97.3 kg)  12/13/21 221 lb 2 oz (100.3 kg)  06/14/21 205 lb 2 oz (93 kg)   Lab Results  Component Value Date   CREATININE 1.21 06/02/2021   CREATININE 1.78 (H) 03/18/2020   CREATININE 0.80 12/10/2019   Physical Exam Vitals and nursing note reviewed. Exam conducted  with a chaperone present (staff member).  Constitutional:      Appearance: Normal appearance.  HENT:     Head: Normocephalic and atraumatic.  Cardiovascular:     Rate and Rhythm: Normal rate and regular rhythm.  Pulmonary:     Effort: Pulmonary effort is normal.     Breath sounds: No wheezing, rhonchi or rales.  Abdominal:     General: There is no distension.     Palpations: Abdomen is soft.  Musculoskeletal:        General: No tenderness.     Cervical back: Normal range of motion and neck supple.     Right lower leg: No edema.     Left lower leg: No edema.  Skin:    General: Skin is warm and dry.  Neurological:     General: No focal deficit present.     Mental Status: He is alert and oriented to person, place, and time.  Psychiatric:        Mood and Affect: Mood normal.        Behavior: Behavior normal.    Assessment & Plan:  1: Chronic heart failure with reduced, although improved, ejection fraction- - NYHA class I - euvolemic today - weighing daily at group home - weight down 7 lbs from his last visit here 6 months ago - not adding salt to his food - saw  cardiology Dema Severin) 05/18/22 - verquvo contraindicated due to patient being on nitrates  - on GDMT of farxiga, metoprolol and entresto  - no longer on spironolactone and BP today will not support it - BNP 12/08/19 was 2195.0  2: HTN- - BP low (98/75) although without any dizziness - sees PCP at [facility] Stann Mainland) - BMP on 03/28/22 reviewed and showed sodium 141, potassium 3.6, creatinine 1.36 and GFR 62  3: DM- - A1c 04/27/20 was 7.4%; - glucose is checked daily at the group home  4: Renal mass - saw nephrology Percell Miller) 03/30/22 - nephrologist placed referral again to [facility]  5: Schizophrenia- - living at a group home called [facility]  6: COPD- - saw pulmonology Ashby Dawes) 11/29/17; referral placed to return to him - probably needs PFT's - smokes ~ 1/2 ppd cigarettes  - complete cessation discussed for 3 minutes with him   Facility medication list was reviewed.  Return in 6 months, sooner if needed.

## 2022-12-25 NOTE — Progress Notes (Deleted)
Patient ID: Paul Hernandez, male    DOB: [birth year], 57 y.o.   MRN: [id number]  HPI  Paul Hernandez is a 57 y/o male with a history of HTN, DM, COPD, schizophrenia, current tobacco use and chronic heart failure.   Echo report from 05/09/22 reviewed and showed an EF of 45% along with mild LVH. Echo report from 12/04/19 reviewed and showed an EF of 20-25%. Echo report from 11/30/2018 reviewed and showed an EF of 20-25%.  Has not been admitted or been in the ED in the last 6 months.   He presents today for with a chief complaint of a follow-up visit.   Continues to smoke 1/2 ppd. Overall he feels "great"  Past Medical History:  Diagnosis Date   CHF (congestive heart failure) ([place]) 12/05/2019   COPD (chronic obstructive pulmonary disease) ([place]) 12/05/2019   Diabetes mellitus without complication ([place]) [id number]   Hypertension 12/05/2019   Mental health disorder 12/05/2019   Past Surgical History:  Procedure Laterality Date   NO PAST SURGERIES     Family History  Problem Relation Age of Onset   CVA Mother    CAD Father    Social History   Tobacco Use   Smoking status: Some Days    Packs/day: 1.00    Years: 35.00    Total pack years: 35.00    Types: Cigarettes    Start date: 09/07/2019   Smokeless tobacco: Never  Substance Use Topics   Alcohol use: No   Allergies  Allergen Reactions   Asa [Aspirin]     Review of Systems  Constitutional:  Negative for appetite change, fatigue and fever.  HENT:  Negative for congestion, rhinorrhea and sore throat.   Eyes: Negative.   Respiratory:  Negative for cough, chest tightness, shortness of breath and wheezing.   Cardiovascular:  Negative for chest pain, palpitations and leg swelling.  Gastrointestinal:  Negative for abdominal distention and abdominal pain.  Endocrine: Negative.   Genitourinary: Negative.   Musculoskeletal:  Negative for arthralgias and back pain.  Skin:  Negative for rash and wound.  Allergic/Immunologic: Negative.    Neurological:  Negative for dizziness and light-headedness.  Hematological:  Negative for adenopathy. Does not bruise/bleed easily.  Psychiatric/Behavioral:  Negative for dysphoric mood and sleep disturbance (sleeps with 2 pillows). The patient is not nervous/anxious.      Physical Exam Vitals and nursing note reviewed. Exam conducted with a chaperone present (staff member).  Constitutional:      Appearance: Normal appearance.  HENT:     Head: Normocephalic and atraumatic.  Cardiovascular:     Rate and Rhythm: Normal rate and regular rhythm.  Pulmonary:     Effort: Pulmonary effort is normal.     Breath sounds: No wheezing, rhonchi or rales.  Abdominal:     General: There is no distension.     Palpations: Abdomen is soft.  Musculoskeletal:        General: No tenderness.     Cervical back: Normal range of motion and neck supple.     Right lower leg: No edema.     Left lower leg: No edema.  Skin:    General: Skin is warm and dry.  Neurological:     General: No focal deficit present.     Mental Status: He is alert and oriented to person, place, and time.  Psychiatric:        Mood and Affect: Mood normal.        Behavior: Behavior normal.  Assessment & Plan:  1: Chronic heart failure with reduced, although improved, ejection fraction- - NYHA class I - euvolemic today - weighing daily at group home - weight 214.8 lbs from his last visit here 6 months ago - not adding salt to his food - saw cardiology [facility]) 09/19/22 - verquvo contraindicated due to patient being on nitrates  - on GDMT of farxiga, metoprolol and entresto  - no longer on spironolactone and BP today will not support it - BNP 12/08/19 was 2195.0  2: HTN- - BP  - sees PCP at [facility]) - BMP on 03/28/22 reviewed and showed sodium 141, potassium 3.6, creatinine 1.36 and GFR 62  3: DM- - A1c 04/27/20 was 7.4%; - glucose is checked daily at the group home  4: Renal mass - saw  nephrology Paul Hernandez) 09/19/22 - nephrologist placed referral again to [facility]  5: Schizophrenia- - living at a group home called [facility]  6: COPD- - saw pulmonology Paul Hernandez) 11/29/17; referral placed to return to him - probably needs PFT's - smokes ~ 1/2 ppd cigarettes  - complete cessation discussed for 3 minutes with him   Facility medication list was reviewed.

## 2022-12-26 ENCOUNTER — Ambulatory Visit: Payer: Medicare HMO | Admitting: Family

## 2022-12-26 ENCOUNTER — Telehealth: Payer: Self-pay | Admitting: Family

## 2022-12-26 NOTE — Telephone Encounter (Signed)
Patient did not show for his Heart Failure Clinic appointment on 12/26/22.

## 2023-07-24 NOTE — Progress Notes (Unsigned)
PCP: Primary Cardiologist: Dorothyann Peng, MD (last seen 06/24)  HPI:  Paul Hernandez is a 57 y/o male with a history of HTN, DM, COPD, schizophrenia, current tobacco use and chronic heart failure.   Echo 11/30/2018: EF 20-25%. Echo 12/04/19: EF 20-25% Echo 05/09/22: EF 45% along with mild LVH. Marland Kitchen   Has not been admitted or been in the ED in the last 6 months.   He presents today for with a chief complaint of a follow-up visit.   Continues to smoke 1/2 ppd.   ROS: All systems negative except as listed in HPI, PMH and Problem List.  SH:  Social History   Socioeconomic History   Marital status: Single    Spouse name: Not on file   Number of children: Not on file   Years of education: Not on file   Highest education level: Not on file  Occupational History   Occupation: disabled  Tobacco Use   Smoking status: Some Days    Current packs/day: 1.00    Average packs/day: 1 pack/day for 35.0 years (35.0 ttl pk-yrs)    Types: Cigarettes    Start date: 09/07/2019   Smokeless tobacco: Never  Vaping Use   Vaping status: Never Used  Substance and Sexual Activity   Alcohol use: No   Drug use: No   Sexual activity: Not Currently  Other Topics Concern   Not on file  Social History Narrative   Not on file   Social Determinants of Health   Financial Resource Strain: Low Risk  (09/19/2022)   Received from [facility]   Overall Financial Resource Strain (CARDIA)    Difficulty of Paying Living Expenses: Not very hard  Food Insecurity: No Food Insecurity (09/19/2022)   [facility]   Hunger Vital Sign    Worried About Running Out of Food in the Last Year: Never true    Ran Out of Food in the Last Year: Never true  Transportation Needs: No Transportation Needs (09/19/2022)   Received from [facility]   PRAPARE - Transportation    Lack of Transportation (Medical): No    Lack of Transportation (Non-Medical): No  Physical Activity: Inactive (12/12/2018)    Exercise Vital Sign    Days of Exercise per Week: 0 days    Minutes of Exercise per Session: 0 min  Stress: No Stress Concern Present (12/12/2018)   [employer] of Occupational Health - Occupational Stress Questionnaire    Feeling of Stress : Not at all  Social Connections: Unknown (12/12/2018)   Social Connection and Isolation Panel [NHANES]    Frequency of Communication with Friends and Family: Once a week    Frequency of Social Gatherings with Friends and Family: Once a week    Attends Religious Services: 1 to 4 times per year    Active Member of [employer] or Organizations: No    Attends Banker Meetings: Never    Marital Status: Patient declined  Catering manager Violence: Not At Risk (12/12/2018)   Humiliation, Afraid, Rape, and Kick questionnaire    Fear of Current or Ex-Partner: No    Emotionally Abused: No    Physically Abused: No    Sexually Abused: No    FH:  Family History  Problem Relation Age of Onset   CVA Mother    CAD Father     Past Medical History:  Diagnosis Date   CHF (congestive heart failure) ([facility]) 12/05/2019   COPD (chronic obstructive pulmonary disease) ([facility])  12/05/2019   Diabetes mellitus without complication ([facility]) 12/05/2019   Hypertension 12/05/2019   Mental health disorder 12/05/2019    Current Outpatient Medications  Medication Sig Dispense Refill   acetaminophen (TYLENOL) 500 MG tablet Take 500 mg by mouth every 4 (four) hours as needed.     albuterol (VENTOLIN HFA) 108 (90 Base) MCG/ACT inhaler Inhale 2 puffs into the lungs every 6 (six) hours as needed for wheezing or shortness of breath. 8 g 0   amiodarone (PACERONE) 200 MG tablet Take 1 tablet (200 mg total) by mouth daily. 30 tablet 0   benztropine (COGENTIN) 2 MG tablet Take 2 mg by mouth at bedtime as needed for tremors.     Cholecalciferol (VITAMIN D3) 5000 units CAPS Take by mouth daily.      dapagliflozin propanediol (FARXIGA) 5 MG TABS tablet Take 10 mg by mouth daily.      dextrose (GLUTOSE) 40 % GEL Take by mouth once as needed for low blood sugar. For blood sugar <70     diphenhydrAMINE (SOMINEX) 25 MG tablet Take 25 mg by mouth at bedtime as needed for sleep.     ezetimibe (ZETIA) 10 MG tablet Take 10 mg by mouth daily.     ferrous sulfate 325 (65 FE) MG tablet Take 325 mg by mouth daily with breakfast.     Finerenone (KERENDIA) 10 MG TABS Take 10 mg by mouth daily.     fluticasone (FLONASE) 50 MCG/ACT nasal spray Place 1 spray into both nostrils daily.     formoterol (PERFOROMIST) 20 MCG/2ML nebulizer solution Take 20 mcg by nebulization 2 (two) times daily.     Glycopyrrolate (LONHALA MAGNAIR STARTER KIT) 25 MCG/ML SOLN Inhale 25 mcg into the lungs 2 (two) times daily.     haloperidol decanoate (HALDOL DECANOATE) 100 MG/ML injection Inject 90 mg into the muscle every 28 (twenty-eight) days.      hydrALAZINE (APRESOLINE) 25 MG tablet Take 37.5 mg by mouth 3 (three) times daily.     ipratropium-albuterol (DUONEB) 0.5-2.5 (3) MG/3ML SOLN Take 3 mLs by nebulization 4 (four) times daily.     isosorbide-hydrALAZINE (BIDIL) 20-37.5 MG tablet Take 1 tablet by mouth 3 (three) times daily.     liraglutide (VICTOZA) 18 MG/3ML SOPN Inject 0.6 mg into the skin daily.     LORazepam (ATIVAN) 0.5 MG tablet Take 0.5 mg by mouth every 4 (four) hours as needed (agitation). (Patient not taking: Reported on 12/13/2021)     metFORMIN (GLUCOPHAGE) 500 MG tablet Take 500 mg by mouth 2 (two) times daily with a meal.     metoprolol succinate (TOPROL-XL) 25 MG 24 hr tablet Take 25 mg by mouth daily.     potassium chloride SA (KLOR-CON) 20 MEQ tablet TAKE 1/2 TABLET (10 MEQ TOTAL) BY MOUTH DAILY (Patient not taking: Reported on 12/13/2021) 15 tablet 5   predniSONE (DELTASONE) 20 MG tablet Take 20 mg by mouth daily. (Patient not taking: Reported on 12/13/2021)     rosuvastatin (CRESTOR) 40 MG tablet Take 40 mg by mouth daily.     sacubitril-valsartan (ENTRESTO) 97-103 MG Take 1 tablet by  mouth 2 (two) times daily. 60 tablet 5   spironolactone (ALDACTONE) 25 MG tablet Take 0.5 tablets (12.5 mg total) by mouth daily. (Patient not taking: Reported on 12/13/2021) 15 tablet 3   torsemide (DEMADEX) 20 MG tablet TAKE 1 TABLET BY MOUTH DAILY 90 tablet 3   vitamin B-12 (CYANOCOBALAMIN) 1000 MCG tablet Take 1,000 mcg by mouth daily.  No current facility-administered medications for this visit.      PHYSICAL EXAM:  General:  Well appearing. No resp difficulty HEENT: normal Neck: supple. JVP flat. No lymphadenopathy or thryomegaly appreciated. Cor: PMI normal. Regular rate & rhythm. No rubs, gallops or murmurs. Lungs: clear Abdomen: soft, nontender, nondistended. No hepatosplenomegaly. No bruits or masses.  Extremities: no cyanosis, clubbing, rash, edema Neuro: alert & oriented x3, cranial nerves grossly intact. Moves all 4 extremities w/o difficulty. Affect pleasant.   ECG:   ASSESSMENT & PLAN:  1: Chronic heart failure with reduced, although improved, ejection fraction- - suspect due to  - NYHA class I - euvolemic today - weighing daily at group home - weight 214.8 lbs from his last visit here 1 year ago - not adding salt to his food - saw cardiology [facility]) 06/24 - verquvo contraindicated due to patient being on nitrates  - continue farxiga - continue metoprolol - continue entresto - no longer on spironolactone and BP today will not support it - BNP 12/08/19 was 2195.0  2: HTN- - BP  - sees PCP at [facility]) - BMP 06/11/23 reviewed and showed sodium 141, potassium 4.2, creatinine 1.55 and GFR 53  3: DM- - A1c 04/27/20 was 7.4%; - glucose is checked daily at the group home  4: Renal mass - saw nephrology [facility]) 08/24   5: Schizophrenia- - living at a group home called [facility]  6: COPD- - saw pulmonology [facility]) 02/19 - probably needs PFT's - smokes ~ 1/2 ppd cigarettes  - complete cessation discussed for 3  minutes with him

## 2023-07-25 ENCOUNTER — Ambulatory Visit: Payer: Medicare HMO | Attending: Family | Admitting: Family

## 2023-07-25 ENCOUNTER — Encounter: Payer: Self-pay | Admitting: Family

## 2023-07-25 VITALS — BP 121/78 | HR 88 | Wt 200.0 lb

## 2023-07-25 DIAGNOSIS — N2889 Other specified disorders of kidney and ureter: Secondary | ICD-10-CM | POA: Insufficient documentation

## 2023-07-25 DIAGNOSIS — F209 Schizophrenia, unspecified: Secondary | ICD-10-CM | POA: Diagnosis not present

## 2023-07-25 DIAGNOSIS — Z79899 Other long term (current) drug therapy: Secondary | ICD-10-CM | POA: Diagnosis not present

## 2023-07-25 DIAGNOSIS — F203 Undifferentiated schizophrenia: Secondary | ICD-10-CM

## 2023-07-25 DIAGNOSIS — J449 Chronic obstructive pulmonary disease, unspecified: Secondary | ICD-10-CM | POA: Diagnosis not present

## 2023-07-25 DIAGNOSIS — I10 Essential (primary) hypertension: Secondary | ICD-10-CM | POA: Diagnosis not present

## 2023-07-25 DIAGNOSIS — E119 Type 2 diabetes mellitus without complications: Secondary | ICD-10-CM | POA: Diagnosis not present

## 2023-07-25 DIAGNOSIS — I5022 Chronic systolic (congestive) heart failure: Secondary | ICD-10-CM | POA: Diagnosis not present

## 2023-07-25 DIAGNOSIS — I110 Hypertensive heart disease with heart failure: Secondary | ICD-10-CM | POA: Insufficient documentation

## 2023-07-25 DIAGNOSIS — I428 Other cardiomyopathies: Secondary | ICD-10-CM | POA: Diagnosis present

## 2023-07-25 NOTE — Patient Instructions (Signed)
Patient is doing well. No medication changes. Please fax over pt's most recent lab results and echocardiogram results to our clinic at [phone].

## 2024-01-22 ENCOUNTER — Telehealth: Payer: Self-pay | Admitting: Family

## 2024-01-22 NOTE — Telephone Encounter (Incomplete)
 Called to confirm/remind patient of their appointment at [facility] on 01/23/24***.   Appointment:   [] Confirmed  [] Left mess   [x] No answer/No voice mail  [] Phone not in service  Patient reminded to bring all medications and/or complete list.  Confirmed patient has transportation. Gave directions, instructed to utilize valet parking.

## 2024-01-23 ENCOUNTER — Encounter: Payer: Medicare HMO | Admitting: Family

## 2024-01-23 ENCOUNTER — Telehealth: Payer: Self-pay | Admitting: Family

## 2024-01-23 NOTE — Telephone Encounter (Signed)
 Patient did not show for his [facility] Clinic appointment on 01/23/24.

## 2024-01-23 NOTE — Progress Notes (Deleted)
 Advanced [facility] Failure Clinic Note   Referring Physician: PCP: Franciso Bend, NP Cardiologist: None   Chief Complaint:   HPI:  PCP: Rexene Agent, NP (last seen 09/24) Primary Cardiologist: Dorothyann Peng, MD (last seen 06/24; returns 12/24)  HPI:  [name] is a 58 y/o male with a history of HTN, DM, COPD, schizophrenia, current tobacco use and chronic heart failure.   Echo 11/30/2018: EF 20-25%. Echo 12/04/19: EF 20-25% Echo 05/09/22: EF 45% along with mild LVH. Marland Kitchen   Has not been admitted or been in the ED in the last 6 months.   He presents today for a HF f/u visit with a chief complaint of minimal fatigue with moderate exertion such as walking up a lot of steps. Has no other complaints and specifically denies shortness of breath, chest pain, cough, palpitations, abdominal distention, pedal edema, dizziness, difficulty sleeping or weight gain.    Stopped smoking completely 2 weeks ago. Per the paperwork he has with him, BMP has been done recently as well as echo/ ultrasounds are to be done. Not adding any salt to his food.      Review of Systems: [y] = yes, [ ]  = no   General: Weight gain [ ] ; Weight loss [ ] ; Anorexia [ ] ; Fatigue [ ] ; Fever [ ] ; Chills [ ] ; Weakness [ ]   Cardiac: Chest pain/pressure [ ] ; Resting SOB [ ] ; Exertional SOB [ ] ; Orthopnea [ ] ; Pedal Edema [ ] ; Palpitations [ ] ; Syncope [ ] ; Presyncope [ ] ; Paroxysmal nocturnal dyspnea[ ]   Pulmonary: Cough [ ] ; Wheezing[ ] ; Hemoptysis[ ] ; Sputum [ ] ; Snoring [ ]   GI: Vomiting[ ] ; Dysphagia[ ] ; Melena[ ] ; Hematochezia [ ] ; Heartburn[ ] ; Abdominal pain [ ] ; Constipation [ ] ; Diarrhea [ ] ; BRBPR [ ]   GU: Hematuria[ ] ; Dysuria [ ] ; Nocturia[ ]   Vascular: Pain in legs with walking [ ] ; Pain in feet with lying flat [ ] ; Non-healing sores [ ] ; Stroke [ ] ; TIA [ ] ; Slurred speech [ ] ;  Neuro: Headaches[ ] ; Vertigo[ ] ; Seizures[ ] ; Paresthesias[ ] ;Blurred vision [ ] ; Diplopia [ ] ; Vision changes [ ]   Ortho/Skin:  Arthritis [ ] ; Joint pain [ ] ; Muscle pain [ ] ; Joint swelling [ ] ; Back Pain [ ] ; Rash [ ]   Psych: Depression[ ] ; Anxiety[ ]   Heme: Bleeding problems [ ] ; Clotting disorders [ ] ; Anemia [ ]   Endocrine: Diabetes [ ] ; Thyroid dysfunction[ ]    Past Medical History:  Diagnosis Date   CHF (congestive heart failure) (HCC) 12/05/2019   COPD (chronic obstructive pulmonary disease) (HCC) 12/05/2019   Diabetes mellitus without complication (HCC) 12/05/2019   Hypertension 12/05/2019   Mental health disorder 12/05/2019    Current Outpatient Medications  Medication Sig Dispense Refill   acetaminophen (TYLENOL) 500 MG tablet Take 500 mg by mouth every 4 (four) hours as needed.     albuterol (VENTOLIN HFA) 108 (90 Base) MCG/ACT inhaler Inhale 2 puffs into the lungs every 6 (six) hours as needed for wheezing or shortness of breath. 8 g 0   amiodarone (PACERONE) 200 MG tablet Take 1 tablet (200 mg total) by mouth daily. 30 tablet 0   benztropine (COGENTIN) 2 MG tablet Take 2 mg by mouth at bedtime as needed for tremors.     Cholecalciferol (VITAMIN D3) 5000 units CAPS Take by mouth daily.      dapagliflozin propanediol (FARXIGA) 5 MG TABS tablet Take 10 mg by mouth daily.     dextrose (GLUTOSE) 40 %  GEL Take by mouth once as needed for low blood sugar. For blood sugar <70     diphenhydrAMINE (SOMINEX) 25 MG tablet Take 25 mg by mouth at bedtime as needed for sleep.     ezetimibe (ZETIA) 10 MG tablet Take 10 mg by mouth daily.     ferrous sulfate 325 (65 FE) MG tablet Take 325 mg by mouth daily with breakfast.     Finerenone (KERENDIA) 10 MG TABS Take 10 mg by mouth daily.     fluticasone (FLONASE) 50 MCG/ACT nasal spray Place 1 spray into both nostrils daily.     formoterol (PERFOROMIST) 20 MCG/2ML nebulizer solution Take 20 mcg by nebulization 2 (two) times daily.     Glycopyrrolate (LONHALA MAGNAIR STARTER KIT) 25 MCG/ML SOLN Inhale 25 mcg into the lungs 2 (two) times daily.     haloperidol  decanoate (HALDOL DECANOATE) 100 MG/ML injection Inject 90 mg into the muscle every 28 (twenty-eight) days.      ipratropium-albuterol (DUONEB) 0.5-2.5 (3) MG/3ML SOLN Take 3 mLs by nebulization 4 (four) times daily.     isosorbide-hydrALAZINE (BIDIL) 20-37.5 MG tablet Take 1 tablet by mouth 3 (three) times daily.     liraglutide (VICTOZA) 18 MG/3ML SOPN Inject 0.6 mg into the skin daily.     LORazepam (ATIVAN) 0.5 MG tablet Take 0.5 mg by mouth every 4 (four) hours as needed (agitation).     metFORMIN (GLUCOPHAGE) 500 MG tablet Take 500 mg by mouth 2 (two) times daily with a meal.     metoprolol succinate (TOPROL-XL) 25 MG 24 hr tablet Take 25 mg by mouth daily.     potassium chloride SA (KLOR-CON) 20 MEQ tablet TAKE 1/2 TABLET (10 MEQ TOTAL) BY MOUTH DAILY 15 tablet 5   predniSONE (DELTASONE) 20 MG tablet Take 20 mg by mouth daily.     rosuvastatin (CRESTOR) 40 MG tablet Take 40 mg by mouth daily.     sacubitril-valsartan (ENTRESTO) 97-103 MG Take 1 tablet by mouth 2 (two) times daily. 60 tablet 5   spironolactone (ALDACTONE) 25 MG tablet Take 0.5 tablets (12.5 mg total) by mouth daily. 15 tablet 3   torsemide (DEMADEX) 20 MG tablet TAKE 1 TABLET BY MOUTH DAILY 90 tablet 3   vitamin B-12 (CYANOCOBALAMIN) 1000 MCG tablet Take 1,000 mcg by mouth daily.     No current facility-administered medications for this visit.    Allergies  Allergen Reactions   Asa [Aspirin]       Social History   Socioeconomic History   Marital status: Single    Spouse name: Not on file   Number of children: Not on file   Years of education: Not on file   Highest education level: Not on file  Occupational History   Occupation: disabled  Tobacco Use   Smoking status: Some Days    Current packs/day: 1.00    Average packs/day: 1 pack/day for 35.0 years (35.0 ttl pk-yrs)    Types: Cigarettes    Start date: 09/07/2019   Smokeless tobacco: Never  Vaping Use   Vaping status: Never Used  Substance and Sexual  Activity   Alcohol use: No   Drug use: No   Sexual activity: Not Currently  Other Topics Concern   Not on file  Social History Narrative   Not on file   Social Drivers of Health   Financial Resource Strain: Low Risk  (09/19/2022)   Received from [facility], [facility]   Overall Financial Resource Strain (  CARDIA)    Difficulty of Paying Living Expenses: Not very hard  Food Insecurity: No Food Insecurity (09/19/2022)   Received from [facility], [facility]   Hunger Vital Sign    Worried About Running Out of Food in the Last Year: Never true    Ran Out of Food in the Last Year: Never true  Transportation Needs: No Transportation Needs (09/19/2022)   Received from [facility], [facility]   [facility] - Transportation    Lack of Transportation (Medical): No    Lack of Transportation (Non-Medical): No  Physical Activity: Inactive (12/12/2018)   Exercise Vital Sign    Days of Exercise per Week: 0 days    Minutes of Exercise per Session: 0 min  Stress: No Stress Concern Present (12/12/2018)   [employer] of Occupational Health - Occupational Stress Questionnaire    Feeling of Stress : Not at all  Social Connections: Unknown (12/12/2018)   Social Connection and Isolation Panel [NHANES]    Frequency of Communication with Friends and Family: Once a week    Frequency of Social Gatherings with Friends and Family: Once a week    Attends Religious Services: 1 to 4 times per year    Active Member of [employer] or Organizations: No    Attends Banker Meetings: Never    Marital Status: Patient declined  Catering manager Violence: Not At Risk (12/12/2018)   Humiliation, Afraid, Rape, and Kick questionnaire    Fear of Current or Ex-Partner: No    Emotionally Abused: No    Physically Abused: No    Sexually Abused: No      Family History  Problem Relation Age of Onset   CVA Mother    CAD Father     There were no vitals filed for this  visit.   PHYSICAL EXAM: General:  Well appearing. No respiratory difficulty HEENT: normal Neck: supple. no JVD. Carotids 2+ bilat; no bruits. No lymphadenopathy or thyromegaly appreciated. Cor: PMI nondisplaced. Regular rate & rhythm. No rubs, gallops or murmurs. Lungs: clear Abdomen: soft, nontender, nondistended. No hepatosplenomegaly. No bruits or masses. Good bowel sounds. Extremities: no cyanosis, clubbing, rash, edema Neuro: alert & oriented x 3, cranial nerves grossly intact. moves all 4 extremities w/o difficulty. Affect pleasant.  ECG:   ASSESSMENT & PLAN:   1: NICM with reduced ejection fraction- - suspect due to HTN - NYHA class II - euvolemic today - weighing daily at group home - weight down 14 lbs from his last visit here 1 year ago - Echo 11/30/2018: EF 20-25%. - Echo 12/04/19: EF 20-25% - Echo 05/09/22: EF 45% along with mild LVH.  - have written on orders for labs, ultrasound and echo results be faxed to [place] - not adding salt to his food - saw cardiology Juliann Pares) 06/24 - verquvo contraindicated due to patient being on nitrates  - continue farxiga 10mg  daily - continue bidil 20/37.5mg  TID - continue metoprolol succinate 25mg  daily - continue entresto 97/103mg  BID - continue spironolactone 12.5mg  daily - continue torsemide 20mg  daily/ potassium daily - BNP 12/08/19 was 2195.0  2: HTN- - BP 121/78 - [facility] PCP at [facility] Aundria Rud) 09/24 - BMP 06/11/23 reviewed and showed sodium 141, potassium 4.2, creatinine 1.55 and GFR 53 - have asked for most recent labs be sent to [place]  3: DM- - A1c 04/27/20 was 7.4%; - glucose is checked daily at the group home  4: Renal mass - saw nephrology Eulah Pont)  08/24 - 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    Delma Freeze, FNP 01/23/24

## 2024-02-22 ENCOUNTER — Telehealth: Payer: Self-pay | Admitting: Family

## 2024-02-22 NOTE — Telephone Encounter (Signed)
 Called to confirm/remind patient of their appointment at the [facility] on 02/25/24.   Appointment:   [] Confirmed  [] Left mess   [x] No answer/No voice mail  [] VM Full/unable to leave message  [] Phone not in service  Patient reminded to bring all medications and/or complete list.  Confirmed patient has transportation. Gave directions, instructed to utilize valet parking.

## 2024-02-22 NOTE — Progress Notes (Unsigned)
 Advanced Heart Failure Clinic Note   Referring Physician: PCP: Paul Stammer, NP Cardiologist: None   Chief Complaint:   HPI:  PCP: Erenest Hatchet, NP (last seen 09/24) Primary Cardiologist: Burney Carter, MD (last seen 06/24; returns 12/24)  HPI:  [name] is a 58 y/o male with a history of HTN, DM, COPD, schizophrenia, current tobacco use and chronic heart failure.   Echo 11/30/2018: EF 20-25%. Echo 12/04/19: EF 20-25% Echo 05/09/22: EF 45% along with mild LVH. Paul Hernandez   Has not been admitted or been in the ED in the last 6 months.   He presents today for a HF f/u visit with a chief complaint of minimal fatigue with moderate exertion such as walking up a lot of steps. Has no other complaints and specifically denies shortness of breath, chest pain, cough, palpitations, abdominal distention, pedal edema, dizziness, difficulty sleeping or weight gain.    Stopped smoking completely 2 weeks ago. Per the paperwork he has with him, BMP has been done recently as well as echo/ ultrasounds are to be done. Not adding any salt to his food.      Review of Systems: [y] = yes, [ ]  = no   General: Weight gain [ ] ; Weight loss [ ] ; Anorexia [ ] ; Fatigue [ ] ; Fever [ ] ; Chills [ ] ; Weakness [ ]   Cardiac: Chest pain/pressure [ ] ; Resting SOB [ ] ; Exertional SOB [ ] ; Orthopnea [ ] ; Pedal Edema [ ] ; Palpitations [ ] ; Syncope [ ] ; Presyncope [ ] ; Paroxysmal nocturnal dyspnea[ ]   Pulmonary: Cough [ ] ; Wheezing[ ] ; Hemoptysis[ ] ; Sputum [ ] ; Snoring [ ]   GI: Vomiting[ ] ; Dysphagia[ ] ; Melena[ ] ; Hematochezia [ ] ; Heartburn[ ] ; Abdominal pain [ ] ; Constipation [ ] ; Diarrhea [ ] ; BRBPR [ ]   GU: Hematuria[ ] ; Dysuria [ ] ; Nocturia[ ]   Vascular: Pain in legs with walking [ ] ; Pain in feet with lying flat [ ] ; Non-healing sores [ ] ; Stroke [ ] ; TIA [ ] ; Slurred speech [ ] ;  Neuro: Headaches[ ] ; Vertigo[ ] ; Seizures[ ] ; Paresthesias[ ] ;Blurred vision [ ] ; Diplopia [ ] ; Vision changes [ ]   Ortho/Skin:  Arthritis [ ] ; Joint pain [ ] ; Muscle pain [ ] ; Joint swelling [ ] ; Back Pain [ ] ; Rash [ ]   Psych: Depression[ ] ; Anxiety[ ]   Heme: Bleeding problems [ ] ; Clotting disorders [ ] ; Anemia [ ]   Endocrine: Diabetes [ ] ; Thyroid dysfunction[ ]    Past Medical History:  Diagnosis Date   CHF (congestive heart failure) (HCC) 12/05/2019   COPD (chronic obstructive pulmonary disease) (HCC) 12/05/2019   Diabetes mellitus without complication (HCC) 12/05/2019   Hypertension 12/05/2019   Mental health disorder 12/05/2019    Current Outpatient Medications  Medication Sig Dispense Refill   acetaminophen  (TYLENOL ) 500 MG tablet Take 500 mg by mouth every 4 (four) hours as needed.     albuterol  (VENTOLIN  HFA) 108 (90 Base) MCG/ACT inhaler Inhale 2 puffs into the lungs every 6 (six) hours as needed for wheezing or shortness of breath. 8 g 0   amiodarone  (PACERONE ) 200 MG tablet Take 1 tablet (200 mg total) by mouth daily. 30 tablet 0   benztropine  (COGENTIN ) 2 MG tablet Take 2 mg by mouth at bedtime as needed for tremors.     Cholecalciferol  (VITAMIN D3) 5000 units CAPS Take by mouth daily.      dapagliflozin propanediol (FARXIGA) 5 MG TABS tablet Take 10 mg by mouth daily.     dextrose  (GLUTOSE) 40 %  GEL Take by mouth once as needed for low blood sugar. For blood sugar <70     diphenhydrAMINE  (SOMINEX) 25 MG tablet Take 25 mg by mouth at bedtime as needed for sleep.     ezetimibe  (ZETIA ) 10 MG tablet Take 10 mg by mouth daily.     ferrous sulfate 325 (65 FE) MG tablet Take 325 mg by mouth daily with breakfast.     Finerenone (KERENDIA) 10 MG TABS Take 10 mg by mouth daily.     fluticasone  (FLONASE ) 50 MCG/ACT nasal spray Place 1 spray into both nostrils daily.     formoterol (PERFOROMIST) 20 MCG/2ML nebulizer solution Take 20 mcg by nebulization 2 (two) times daily.     Glycopyrrolate  (LONHALA MAGNAIR  STARTER KIT) 25 MCG/ML SOLN Inhale 25 mcg into the lungs 2 (two) times daily.     haloperidol   decanoate (HALDOL  DECANOATE) 100 MG/ML injection Inject 90 mg into the muscle every 28 (twenty-eight) days.      ipratropium-albuterol  (DUONEB) 0.5-2.5 (3) MG/3ML SOLN Take 3 mLs by nebulization 4 (four) times daily.     isosorbide -hydrALAZINE  (BIDIL ) 20-37.5 MG tablet Take 1 tablet by mouth 3 (three) times daily.     liraglutide (VICTOZA) 18 MG/3ML SOPN Inject 0.6 mg into the skin daily.     LORazepam  (ATIVAN ) 0.5 MG tablet Take 0.5 mg by mouth every 4 (four) hours as needed (agitation).     metFORMIN  (GLUCOPHAGE ) 500 MG tablet Take 500 mg by mouth 2 (two) times daily with a meal.     metoprolol  succinate (TOPROL -XL) 25 MG 24 hr tablet Take 25 mg by mouth daily.     potassium chloride  SA (KLOR-CON ) 20 MEQ tablet TAKE 1/2 TABLET (10 MEQ TOTAL) BY MOUTH DAILY 15 tablet 5   predniSONE  (DELTASONE ) 20 MG tablet Take 20 mg by mouth daily.     rosuvastatin  (CRESTOR ) 40 MG tablet Take 40 mg by mouth daily.     sacubitril -valsartan  (ENTRESTO ) 97-103 MG Take 1 tablet by mouth 2 (two) times daily. 60 tablet 5   spironolactone  (ALDACTONE ) 25 MG tablet Take 0.5 tablets (12.5 mg total) by mouth daily. 15 tablet 3   torsemide  (DEMADEX ) 20 MG tablet TAKE 1 TABLET BY MOUTH DAILY 90 tablet 3   vitamin B-12 (CYANOCOBALAMIN) 1000 MCG tablet Take 1,000 mcg by mouth daily.     No current facility-administered medications for this visit.    Allergies  Allergen Reactions   Wendolyn Hamburger ]       Social History   Socioeconomic History   Marital status: Single    Spouse name: Not on file   Number of children: Not on file   Years of education: Not on file   Highest education level: Not on file  Occupational History   Occupation: disabled  Tobacco Use   Smoking status: Some Days    Current packs/day: 1.00    Average packs/day: 1 pack/day for 35.0 years (35.0 ttl pk-yrs)    Types: Cigarettes    Start date: 09/07/2019   Smokeless tobacco: Never  Vaping Use   Vaping status: Never Used  Substance and Sexual  Activity   Alcohol use: No   Drug use: No   Sexual activity: Not Currently  Other Topics Concern   Not on file  Social History Narrative   Not on file   Social Drivers of Health   Financial Resource Strain: Low Risk  (09/19/2022)   Received from [facility], [facility]   Overall Financial Resource Strain (  CARDIA)    Difficulty of Paying Living Expenses: Not very hard  Food Insecurity: No Food Insecurity (09/19/2022)   Received from [facility], [facility]   Hunger Vital Sign    Worried About Running Out of Food in the Last Year: Never true    Ran Out of Food in the Last Year: Never true  Transportation Needs: No Transportation Needs (09/19/2022)   Received from [facility], [facility]   [facility] - Transportation    Lack of Transportation (Medical): No    Lack of Transportation (Non-Medical): No  Physical Activity: Inactive (12/12/2018)   Exercise Vital Sign    Days of Exercise per Week: 0 days    Minutes of Exercise per Session: 0 min  Stress: No Stress Concern Present (12/12/2018)   [employer] of Occupational Health - Occupational Stress Questionnaire    Feeling of Stress : Not at all  Social Connections: Unknown (12/12/2018)   Social Connection and Isolation Panel [NHANES]    Frequency of Communication with Friends and Family: Once a week    Frequency of Social Gatherings with Friends and Family: Once a week    Attends Religious Services: 1 to 4 times per year    Active Member of [employer] or Organizations: No    Attends Banker Meetings: Never    Marital Status: Patient declined  Catering manager Violence: Not At Risk (12/12/2018)   Humiliation, Afraid, Rape, and Kick questionnaire    Fear of Current or Ex-Partner: No    Emotionally Abused: No    Physically Abused: No    Sexually Abused: No      Family History  Problem Relation Age of Onset   CVA Mother    CAD Father     There were no vitals filed for this  visit.   PHYSICAL EXAM: General:  Well appearing. No respiratory difficulty HEENT: normal Neck: supple. no JVD. Carotids 2+ bilat; no bruits. No lymphadenopathy or thyromegaly appreciated. Cor: PMI nondisplaced. Regular rate & rhythm. No rubs, gallops or murmurs. Lungs: clear Abdomen: soft, nontender, nondistended. No hepatosplenomegaly. No bruits or masses. Good bowel sounds. Extremities: no cyanosis, clubbing, rash, edema Neuro: alert & oriented x 3, cranial nerves grossly intact. moves all 4 extremities w/o difficulty. Affect pleasant.  ECG:   ASSESSMENT & PLAN:   1: NICM with reduced ejection fraction- - suspect due to HTN - NYHA class II - euvolemic today - weighing daily at group home - weight down 14 lbs from his last visit here 1 year ago - Echo 11/30/2018: EF 20-25%. - Echo 12/04/19: EF 20-25% - Echo 05/09/22: EF 45% along with mild LVH.  - have written on orders for labs, ultrasound and echo results be faxed to us  - not adding salt to his food - saw cardiology Beau Bound) 06/24 - verquvo contraindicated due to patient being on nitrates  - continue farxiga 10mg  daily - continue bidil  20/37.5mg  TID - continue metoprolol  succinate 25mg  daily - continue entresto  97/103mg  BID - continue spironolactone  12.5mg  daily - continue torsemide  20mg  daily/ potassium 10meq daily - BNP 12/08/19 was 2195.0  2: HTN- - BP 121/78 - saw PCP at [facility] Hiram Lukes) 09/24 - BMP 06/11/23 reviewed and showed sodium 141, potassium 4.2, creatinine 1.55 and GFR 53 - have asked for most recent labs be sent to us   3: DM- - A1c 04/27/20 was 7.4%; - glucose is checked daily at the group home  4: Renal mass - saw nephrology Abigail Abler)  08/24 - 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    Charlette Console, FNP 02/22/24

## 2024-02-25 ENCOUNTER — Ambulatory Visit (HOSPITAL_BASED_OUTPATIENT_CLINIC_OR_DEPARTMENT_OTHER): Admitting: Family

## 2024-02-25 ENCOUNTER — Other Ambulatory Visit
Admission: RE | Admit: 2024-02-25 | Discharge: 2024-02-25 | Disposition: A | Discharge status: Home / Self Care | Source: Ambulatory Visit | Attending: Family | Admitting: Family

## 2024-02-25 ENCOUNTER — Encounter: Payer: Self-pay | Admitting: Family

## 2024-02-25 VITALS — BP 95/69 | HR 78 | Wt 209.0 lb

## 2024-02-25 DIAGNOSIS — F17210 Nicotine dependence, cigarettes, uncomplicated: Secondary | ICD-10-CM | POA: Diagnosis not present

## 2024-02-25 DIAGNOSIS — F209 Schizophrenia, unspecified: Secondary | ICD-10-CM | POA: Diagnosis not present

## 2024-02-25 DIAGNOSIS — J449 Chronic obstructive pulmonary disease, unspecified: Secondary | ICD-10-CM | POA: Diagnosis not present

## 2024-02-25 DIAGNOSIS — N2889 Other specified disorders of kidney and ureter: Secondary | ICD-10-CM | POA: Insufficient documentation

## 2024-02-25 DIAGNOSIS — I5022 Chronic systolic (congestive) heart failure: Secondary | ICD-10-CM

## 2024-02-25 DIAGNOSIS — Z7984 Long term (current) use of oral hypoglycemic drugs: Secondary | ICD-10-CM | POA: Diagnosis not present

## 2024-02-25 DIAGNOSIS — I110 Hypertensive heart disease with heart failure: Secondary | ICD-10-CM | POA: Diagnosis not present

## 2024-02-25 DIAGNOSIS — Z79899 Other long term (current) drug therapy: Secondary | ICD-10-CM | POA: Diagnosis not present

## 2024-02-25 DIAGNOSIS — F203 Undifferentiated schizophrenia: Secondary | ICD-10-CM

## 2024-02-25 DIAGNOSIS — I428 Other cardiomyopathies: Secondary | ICD-10-CM | POA: Diagnosis not present

## 2024-02-25 DIAGNOSIS — E119 Type 2 diabetes mellitus without complications: Secondary | ICD-10-CM | POA: Diagnosis not present

## 2024-02-25 DIAGNOSIS — Z7985 Long-term (current) use of injectable non-insulin antidiabetic drugs: Secondary | ICD-10-CM | POA: Diagnosis not present

## 2024-02-25 DIAGNOSIS — I10 Essential (primary) hypertension: Secondary | ICD-10-CM | POA: Diagnosis not present

## 2024-02-25 LAB — BASIC METABOLIC PANEL WITH GFR
Anion gap: 11 (ref 5–15)
BUN: 24 mg/dL — ABNORMAL HIGH (ref 6–20)
CO2: 23 mmol/L (ref 22–32)
Calcium: 9 mg/dL (ref 8.9–10.3)
Chloride: 101 mmol/L (ref 98–111)
Creatinine, Ser: 1.68 mg/dL — ABNORMAL HIGH (ref 0.61–1.24)
GFR, Estimated: 47 mL/min — ABNORMAL LOW (ref >60)
Glucose, Bld: 135 mg/dL — ABNORMAL HIGH (ref 70–99)
Potassium: 3.7 mmol/L (ref 3.5–5.1)
Sodium: 135 mmol/L (ref 135–145)

## 2024-02-25 NOTE — Progress Notes (Signed)
 [facility] - HEART FAILURE CLINIC - PHARMACIST COUNSELING NOTE  Guideline-Directed Medical Therapy/Evidence Based Medicine - HFrEF  ACE/ARB/ARNI: Sacubitril -valsartan  97-103 mg twice daily Beta Blocker: Metoprolol  succinate 25 mg daily Aldosterone Antagonist:  Finerenone 10 mg daily  Diuretic: Torsemide  20 mg daily SGLT2i: Dapagliflozin 10 mg daily  Adherence Assessment  Hernandez you ever forget to take your medication? [] Yes [x] No  Hernandez you ever skip doses due to side effects? [] Yes [x] No  Hernandez you have trouble affording your medicines? [] Yes [x] No  Are you ever unable to pick up your medication due to transportation difficulties? [] Yes [x] No  Hernandez you ever stop taking your medications because you don't believe they are helping? [] Yes [x] No  Hernandez you check your weight daily? [] Yes [x] No   Adherence strategy: Take medication they give him   Barriers to obtaining medications: Able to afford medication ok   Vital signs: HR 78, BP 95/69, weight (pounds) 209 -- BP 140/76 and then 100/73  -- after lunch they took some medicine for BP  Renal Function: GFR 41 12/07/2023 Echo 11/30/2018: EF 20-25%. Echo 12/04/19: EF 20-25% Echo 05/09/22: EF 45% along with mild LVH.      Latest Ref Rng & Units 06/02/2021   11:34 AM 03/18/2020    9:40 AM 12/10/2019   10:09 AM  BMP  Glucose 70 - 99 mg/dL 79  161  096   BUN 6 - 20 mg/dL 14  25  20    Creatinine 0.61 - 1.24 mg/dL 0.45  4.09  8.11   Sodium 135 - 145 mmol/L 138  135  142   Potassium 3.5 - 5.1 mmol/L 4.3  4.1  3.5   Chloride 98 - 111 mmol/L 106  100  89   CO2 22 - 32 mmol/L 25  24  42   Calcium  8.9 - 10.3 mg/dL 9.4  9.0  8.1    Past Medical History:  Diagnosis Date   CHF (congestive heart failure) (HCC) 12/05/2019   COPD (chronic obstructive pulmonary disease) (HCC) 12/05/2019   Diabetes mellitus without complication (HCC) 12/05/2019   Hypertension 12/05/2019   Mental health disorder 12/05/2019   ASSESSMENT Paul Hernandez is a 58 year  old male who presents to the HF clinic for a 6 month follow-up. They have a past medical history significant for HTN, DM, COPD, schizophrenia, current tobacco use, atrial fibrillation and chronic heart failure. At the last appointment, they had recently stopped smoking. No medications were changed.   Reviewing their medications today, they are on maximum tolerated GDMT for HFrEF with improvement. They report doing well on current treatment regimen. They endorsed they are still smoking: one at breakfast, one for lunch, one for dinner - but a maximum dose of 5 per day.   Symptoms: They reported no shortness of breath, no dizziness, no chest pain. Their weight is table and say at the facility they were 207 lbs. Patient without fluid today. Their legs are as they called "chicken legs".   Recent ED Visit (past 6 months):  -- They have not been admitted to the ED in the last 6 months   PLAN  Prevention LDL 11/29/18 with HDL 47, LDL149, TG 68 -- Zetia  10 mg daily  -- Crestor  40 mg daily  A1c 8.3 11/29/2018 --metformin  500 mg BID BP/afib -- Amiodarone  200/d -- Imdur 20 mg TID  Recommendations -- Continue current regimen as directed by NP   Time spent: 15 minutes  Paul Hernandez, PharmD Pharmacy Resident  02/25/2024 11:27 AM

## 2024-02-25 NOTE — Patient Instructions (Signed)
 Lab Work:  Go DOWN to LOWER LEVEL (LL) to have your blood work completed inside of [employer] office.  We will only call you if the results are abnormal or if the provider would like to make medication changes.   Follow-Up in: 6 months with Clarisa Kindred, FNP.  At the [facility], you and your health needs are our priority. We have a designated team specialized in the treatment of Heart Failure. This Care Team includes your primary Heart Failure Specialized Cardiologist (physician), Advanced Practice Providers (APPs- Physician Assistants and Nurse Practitioners), and Pharmacist who all work together to provide you with the care you need, when you need it.   You may see any of the following providers on your designated Care Team at your next follow up:  Dr. Arvilla Meres Dr. Marca Ancona Dr. Dorthula Nettles Dr. Theresia Bough Clarisa Kindred, FNP Enos Fling, RPH-CPP  Please be sure to bring in all your medications bottles to every appointment.   Need to Contact us:  If you have any questions or concerns before your next appointment please send [place] a message through [place] or call our office at [phone].    TO LEAVE A MESSAGE FOR THE NURSE SELECT OPTION 2, PLEASE LEAVE A MESSAGE INCLUDING: YOUR NAME DATE OF BIRTH CALL BACK NUMBER REASON FOR CALL**this is important as we prioritize the call backs  YOU WILL RECEIVE A CALL BACK THE SAME DAY AS LONG AS YOU CALL BEFORE 4:00 PM

## 2024-07-09 ENCOUNTER — Ambulatory Visit (INDEPENDENT_AMBULATORY_CARE_PROVIDER_SITE_OTHER): Admitting: Podiatry

## 2024-07-09 VITALS — Ht 71.0 in | Wt 209.0 lb

## 2024-07-09 DIAGNOSIS — B351 Tinea unguium: Secondary | ICD-10-CM | POA: Diagnosis not present

## 2024-07-09 DIAGNOSIS — E119 Type 2 diabetes mellitus without complications: Secondary | ICD-10-CM

## 2024-07-09 DIAGNOSIS — M79674 Pain in right toe(s): Secondary | ICD-10-CM

## 2024-07-09 DIAGNOSIS — M79675 Pain in left toe(s): Secondary | ICD-10-CM | POA: Diagnosis not present

## 2024-07-09 NOTE — Progress Notes (Signed)
  Subjective:  Patient ID: Paul Hernandez, male    DOB: 1965-12-03,  MRN: [id number]  Chief Complaint  Patient presents with   Nail Problem    RM 1 RFC/Nail Trim    58 y.o. male presents with the above complaint. History confirmed with patient.  He has diabetes.  His nails are thick and elongated and cause pain and discomfort in his shoes.  Objective:  Physical Exam: warm, good capillary refill, no trophic changes or ulcerative lesions, normal DP and PT pulses, and normal sensory exam. Left Foot: dystrophic yellowed discolored nail plates with subungual debris Right Foot: dystrophic yellowed discolored nail plates with subungual debris   Assessment:   1. Pain due to onychomycosis of toenails of both feet   2. Encounter for diabetic foot exam [facility])      Plan:  Patient was evaluated and treated and all questions answered.   Discussed the etiology and treatment options for the condition in detail with the patient. Recommended debridement of the nails today. Sharp and mechanical debridement performed of all painful and mycotic nails today. Nails debrided in length and thickness using a nail nipper to level of comfort. Follow up as needed for painful nails.  Patient educated on diabetes. Discussed proper diabetic foot care and discussed risks and complications of disease. Educated patient in depth on reasons to return to the office immediately should he/she discover anything concerning or new on the feet. All questions answered. Discussed proper shoes as well.    Return in about 3 months (around 10/08/2024) for at risk diabetic foot care.

## 2024-08-22 ENCOUNTER — Telehealth: Payer: Self-pay | Admitting: Family

## 2024-08-22 NOTE — Telephone Encounter (Signed)
 Called to confirm/remind patient of their appointment at the [facility] on 08/25/24.   Appointment:   [x] Confirmed  [] Left mess   [] No answer/No voice mail  [] VM Full/unable to leave message  [] Phone not in service  Patient reminded to bring all medications and/or complete list.  Confirmed patient has transportation. Gave directions, instructed to utilize valet parking.

## 2024-08-24 NOTE — Progress Notes (Signed)
 [facility] Note     PCP: Sharl Nest, NP Primary Cardiologist: Florencio Kava, MD (last seen 12/24)  Chief Complaint: HF follow-up visit  HPI:  Paul Hernandez is a 58 y/o male with a history of HTN, DM, COPD, schizophrenia, current tobacco use and chronic heart failure.   Echo 11/30/2018: EF 20-25%. Echo 12/04/19: EF 20-25% Echo 05/09/22: EF 45% along with mild LVH. SABRA   Has not been admitted or been in the ED in the last 6 months.   He presents today with a chief complaint of a HF follow-up visit with acute concerns. Denies any shortness of breath, fatigue, chest pain, palpitations, dizziness, pedal edema, abdominal distention or difficulty sleeping. Smokes 6-7 cigarettes daily. Denies alcohol and drug use. NAS to meals and drinks 2-3 waters a day. Weighing and BP checks daily.   Reports echo and blood work completion in  September. He is adherent to all medications.   Past Medical History:  Diagnosis Date   CHF (congestive heart failure) (HCC) 12/05/2019   COPD (chronic obstructive pulmonary disease) (HCC) 12/05/2019   Diabetes mellitus without complication (HCC) 12/05/2019   Hypertension 12/05/2019   Mental health disorder 12/05/2019    Current Outpatient Medications  Medication Sig Dispense Refill   acetaminophen  (TYLENOL ) 500 MG tablet Take 500 mg by mouth every 4 (four) hours as needed.     albuterol  (VENTOLIN  HFA) 108 (90 Base) MCG/ACT inhaler Inhale 2 puffs into the lungs every 6 (six) hours as needed for wheezing or shortness of breath. 8 g 0   amiodarone  (PACERONE ) 200 MG tablet Take 1 tablet (200 mg total) by mouth daily. 30 tablet 0   benztropine  (COGENTIN ) 2 MG tablet Take 2 mg by mouth at bedtime as needed for tremors.     Cholecalciferol  (VITAMIN D3) 5000 units CAPS Take by mouth daily.  (Patient not taking: Reported on 07/09/2024)     dapagliflozin propanediol (FARXIGA) 5 MG TABS tablet Take 10 mg by mouth daily.     dextrose  (GLUTOSE) 40 %  GEL Take by mouth once as needed for low blood sugar. For blood sugar <70     diphenhydrAMINE  (SOMINEX) 25 MG tablet Take 25 mg by mouth at bedtime as needed for sleep. (Patient not taking: Reported on 07/09/2024)     ezetimibe  (ZETIA ) 10 MG tablet Take 10 mg by mouth daily.     ferrous sulfate 325 (65 FE) MG tablet Take 325 mg by mouth daily with breakfast.     Finerenone (KERENDIA) 10 MG TABS Take 10 mg by mouth daily.     fluticasone  (FLONASE ) 50 MCG/ACT nasal spray Place 1 spray into both nostrils daily.     formoterol (PERFOROMIST) 20 MCG/2ML nebulizer solution Take 20 mcg by nebulization 2 (two) times daily.     Glycopyrrolate  (LONHALA MAGNAIR  STARTER KIT) 25 MCG/ML SOLN Inhale 25 mcg into the lungs 2 (two) times daily.     haloperidol  decanoate (HALDOL  DECANOATE) 100 MG/ML injection Inject 90 mg into the muscle every 28 (twenty-eight) days.      hydrALAZINE  (APRESOLINE ) 25 MG tablet Take 25 mg by mouth 3 (three) times daily.     ipratropium-albuterol  (DUONEB) 0.5-2.5 (3) MG/3ML SOLN Take 3 mLs by nebulization 4 (four) times daily.     isosorbide -hydrALAZINE  (BIDIL ) 20-37.5 MG tablet Take 1 tablet by mouth 3 (three) times daily.     liraglutide (VICTOZA) 18 MG/3ML SOPN Inject 0.6 mg into the skin daily.     LORazepam  (ATIVAN ) 0.5 MG tablet  Take 0.5 mg by mouth every 4 (four) hours as needed (agitation). (Patient not taking: Reported on 07/09/2024)     metFORMIN  (GLUCOPHAGE ) 500 MG tablet Take 500 mg by mouth 2 (two) times daily with a meal.     metoprolol  succinate (TOPROL -XL) 25 MG 24 hr tablet Take 25 mg by mouth daily.     potassium chloride  SA (KLOR-CON ) 20 MEQ tablet TAKE 1/2 TABLET (10 MEQ TOTAL) BY MOUTH DAILY (Patient not taking: Reported on 07/09/2024) 15 tablet 5   predniSONE  (DELTASONE ) 20 MG tablet Take 20 mg by mouth daily.     rosuvastatin  (CRESTOR ) 40 MG tablet Take 40 mg by mouth daily.     sacubitril -valsartan  (ENTRESTO ) 97-103 MG Take 1 tablet by mouth 2 (two) times daily. 60  tablet 5   torsemide  (DEMADEX ) 20 MG tablet TAKE 1 TABLET BY MOUTH DAILY 90 tablet 3   vitamin B-12 (CYANOCOBALAMIN) 1000 MCG tablet Take 1,000 mcg by mouth daily.     No current facility-administered medications for this visit.    Allergies  Allergen Reactions   Asa [Aspirin ]       Social History   Socioeconomic History   Marital status: Single    Spouse name: Not on file   Number of children: Not on file   Years of education: Not on file   Highest education level: Not on file  Occupational History   Occupation: disabled  Tobacco Use   Smoking status: Some Days    Current packs/day: 1.00    Average packs/day: 1 pack/day for 35.0 years (35.0 ttl pk-yrs)    Types: Cigarettes    Start date: 09/07/2019   Smokeless tobacco: Never  Vaping Use   Vaping status: Never Used  Substance and Sexual Activity   Alcohol use: No   Drug use: No   Sexual activity: Not Currently  Other Topics Concern   Not on file  Social History Narrative   Not on file   Social Drivers of Health   Financial Resource Strain: Low Risk  (09/19/2022)   [facility] from [facility]   Overall Financial Resource Strain (CARDIA)    Difficulty of Paying Living Expenses: Not very hard  Food Insecurity: No Food Insecurity (09/19/2022)   Received from [facility]   Hunger Vital Sign    Within the past 12 months, you worried that your food would run out before you got the money to buy more.: Never true    Within the past 12 months, the food you bought just didn't last and you didn't have money to get more.: Never true  Transportation Needs: No Transportation Needs (09/19/2022)   Received from [facility]   PRAPARE - Transportation    Lack of Transportation (Medical): No    Lack of Transportation (Non-Medical): No  Physical Activity: Inactive (12/12/2018)   Exercise Vital Sign    Days of Exercise per Week: 0 days    Minutes of Exercise per Session: 0 min  Stress: No Stress Concern Present  (12/12/2018)   [employer] of Occupational Health - Occupational Stress Questionnaire    Feeling of Stress : Not at all  Social Connections: Unknown (12/12/2018)   Social Connection and Isolation Panel    Frequency of Communication with Friends and Family: Once a week    Frequency of Social Gatherings with Friends and Family: Once a week    Attends Religious Services: 1 to 4 times per year    Active Member of Clubs or Organizations: No  Attends Banker Meetings: Never    Marital Status: Patient declined  Intimate Partner Violence: Not At Risk (12/12/2018)   Humiliation, Afraid, Rape, and Kick questionnaire    Fear of Current or Ex-Partner: No    Emotionally Abused: No    Physically Abused: No    Sexually Abused: No      Family History  Problem Relation Age of Onset   CVA Mother    CAD Father    Vitals:   08/25/24 1421  BP: 105/76  Pulse: 76  SpO2: 99%  Weight: 91.1 kg   Wt Readings from Last 3 Encounters:  08/25/24 91.1 kg  07/09/24 94.8 kg  02/25/24 94.8 kg   Lab Results  Component Value Date   CREATININE 1.68 (H) 02/25/2024   CREATININE 1.21 06/02/2021   CREATININE 1.78 (H) 03/18/2020    PHYSICAL EXAM:  General: Well appearing. No resp difficulty HEENT: normal Neck: supple, no JVD Cor: Regular rhythm, rate. No rubs, gallops or murmurs Lungs: clear Abdomen: soft, nontender, nondistended. Extremities: no cyanosis, clubbing, rash, edema Neuro: alert & oriented X 3. Moves all 4 extremities w/o difficulty. Affect pleasant   ECG: not done   ASSESSMENT & PLAN:   1: NICM with reduced ejection fraction- - suspect due to HTN - NYHA class I - euvolemic today - weighing daily at group home - weight down 9 lbs from his last visit here 6 months ago - Echo 11/30/2018: EF 20-25%. - Echo 12/04/19: EF 20-25% - Echo 05/09/22: EF 45% along with mild LVH.  - not adding salt to his food - saw cardiology Philippe) 12/24 - verquvo contraindicated due  to patient being on nitrates  - continue farxiga 10mg  daily - continue finerenone 10mg  daily - continue bidil  20/37.5mg  TID - continue metoprolol  succinate 25mg  daily - continue entresto  97/103mg  BID - continue torsemide  20mg  daily - echo performed 09/25 , awaiting results  - BNP 12/08/19 was 2195.0  2: HTN- - BP 105/76 but no dizziness - [facility] P[facility])  - BMP 02/25/24 reviewed and showed sodium 135, potassium 3.7, creatinine 1.68 and GFR 47 - BMET performed 09/25, awaiting results   3: DM- - A1c 04/27/20 was 7.4%; - glucose is checked daily at the group home  4: Renal mass - saw nephrology Alphonse) 05/25 - 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   Ellouise Class, FNP / Solomon Blumenthal, FNP-S

## 2024-08-24 NOTE — Progress Notes (Incomplete)
 [facility] Note     PCP: Paul Nest, NP Primary Cardiologist: Paul Kava, MD (last seen 12/24)  Chief Complaint: HF follow-up visit  HPI:  [name] is a 58 y/o male with a history of HTN, DM, COPD, schizophrenia, current tobacco use and chronic heart failure.   Echo 11/30/2018: EF 20-25%. Echo 12/04/19: EF 20-25% Echo 05/09/22: EF 45% along with mild LVH. Paul Hernandez   Has not been admitted or been in the ED in the last 6 months.   He presents today with a chief complaint of a HF follow-up visit. Currently denies any symptoms and specifically denies any shortness of breath, fatigue, chest pain, palpitations, dizziness, pedal edema, abdominal distention or difficulty sleeping. Smokes 4-5 cigarettes daily. \  Past Medical History:  Diagnosis Date  . CHF (congestive heart failure) (HCC) 12/05/2019  . COPD (chronic obstructive pulmonary disease) (HCC) 12/05/2019  . Diabetes mellitus without complication (HCC) 12/05/2019  . Hypertension 12/05/2019  . Mental health disorder 12/05/2019    Current Outpatient Medications  Medication Sig Dispense Refill  . acetaminophen  (TYLENOL ) 500 MG tablet Take 500 mg by mouth every 4 (four) hours as needed.    . albuterol  (VENTOLIN  HFA) 108 (90 Base) MCG/ACT inhaler Inhale 2 puffs into the lungs every 6 (six) hours as needed for wheezing or shortness of breath. 8 g 0  . amiodarone  (PACERONE ) 200 MG tablet Take 1 tablet (200 mg total) by mouth daily. 30 tablet 0  . benztropine  (COGENTIN ) 2 MG tablet Take 2 mg by mouth at bedtime as needed for tremors.    . Cholecalciferol  (VITAMIN D3) 5000 units CAPS Take by mouth daily.  (Patient not taking: Reported on 07/09/2024)    . dapagliflozin propanediol (FARXIGA) 5 MG TABS tablet Take 10 mg by mouth daily.    . dextrose  (GLUTOSE) 40 % GEL Take by mouth once as needed for low blood sugar. For blood sugar <70    . diphenhydrAMINE  (SOMINEX) 25 MG tablet Take 25 mg by mouth at bedtime as  needed for sleep. (Patient not taking: Reported on 07/09/2024)    . ezetimibe  (ZETIA ) 10 MG tablet Take 10 mg by mouth daily.    . ferrous sulfate 325 (65 FE) MG tablet Take 325 mg by mouth daily with breakfast.    . Finerenone (KERENDIA) 10 MG TABS Take 10 mg by mouth daily.    . fluticasone  (FLONASE ) 50 MCG/ACT nasal spray Place 1 spray into both nostrils daily.    . formoterol (PERFOROMIST) 20 MCG/2ML nebulizer solution Take 20 mcg by nebulization 2 (two) times daily.    . Glycopyrrolate  (LONHALA MAGNAIR  STARTER KIT) 25 MCG/ML SOLN Inhale 25 mcg into the lungs 2 (two) times daily.    . haloperidol  decanoate (HALDOL  DECANOATE) 100 MG/ML injection Inject 90 mg into the muscle every 28 (twenty-eight) days.     . hydrALAZINE  (APRESOLINE ) 25 MG tablet Take 25 mg by mouth 3 (three) times daily.    Paul Hernandez ipratropium-albuterol  (DUONEB) 0.5-2.5 (3) MG/3ML SOLN Take 3 mLs by nebulization 4 (four) times daily.    . isosorbide -hydrALAZINE  (BIDIL ) 20-37.5 MG tablet Take 1 tablet by mouth 3 (three) times daily.    Paul Hernandez liraglutide (VICTOZA) 18 MG/3ML SOPN Inject 0.6 mg into the skin daily.    . LORazepam  (ATIVAN ) 0.5 MG tablet Take 0.5 mg by mouth every 4 (four) hours as needed (agitation). (Patient not taking: Reported on 07/09/2024)    . metFORMIN  (GLUCOPHAGE ) 500 MG tablet Take 500 mg by mouth 2 (  two) times daily with a meal.    . metoprolol  succinate (TOPROL -XL) 25 MG 24 hr tablet Take 25 mg by mouth daily.    . potassium chloride  SA (KLOR-CON ) 20 MEQ tablet TAKE 1/2 TABLET (10 MEQ TOTAL) BY MOUTH DAILY (Patient not taking: Reported on 07/09/2024) 15 tablet 5  . predniSONE  (DELTASONE ) 20 MG tablet Take 20 mg by mouth daily.    . rosuvastatin  (CRESTOR ) 40 MG tablet Take 40 mg by mouth daily.    . sacubitril -valsartan  (ENTRESTO ) 97-103 MG Take 1 tablet by mouth 2 (two) times daily. 60 tablet 5  . torsemide  (DEMADEX ) 20 MG tablet TAKE 1 TABLET BY MOUTH DAILY 90 tablet 3  . vitamin B-12 (CYANOCOBALAMIN) 1000 MCG  tablet Take 1,000 mcg by mouth daily.     No current facility-administered medications for this visit.    Allergies  Allergen Reactions  . Dorethia [Aspirin ]       Social History   Socioeconomic History  . Marital status: Single    Spouse name: Not on file  . Number of children: Not on file  . Years of education: Not on file  . Highest education level: Not on file  Occupational History  . Occupation: disabled  Tobacco Use  . Smoking status: Some Days    Current packs/day: 1.00    Average packs/day: 1 pack/day for 35.0 years (35.0 ttl pk-yrs)    Types: Cigarettes    Start date: 09/07/2019  . Smokeless tobacco: Never  Vaping Use  . Vaping status: Never Used  Substance and Sexual Activity  . Alcohol use: No  . Drug use: No  . Sexual activity: Not Currently  Other Topics Concern  . Not on file  Social History Narrative  . Not on file   Social Drivers of Health   Financial Resource Strain: Low Risk  (09/19/2022)   [name]   Overall Financial Resource Strain (CARDIA)   . Difficulty of Paying Living Expenses: Not very hard  Food Insecurity: No Food Insecurity (09/19/2022)   Received from [facility]   Hunger Vital Sign   . Within the past 12 months, you worried that your food would run out before you got the money to buy more.: Never true   . Within the past 12 months, the food you bought just didn't last and you didn't have money to get more.: Never true  Transportation Needs: No Transportation Needs (09/19/2022)   Received from [facility]   [facility] - Transportation   . Lack of Transportation (Medical): No   . Lack of Transportation (Non-Medical): No  Physical Activity: Inactive (12/12/2018)   Exercise Vital Sign   . Days of Exercise per Week: 0 days   . Minutes of Exercise per Session: 0 min  Stress: No Stress Concern Present (12/12/2018)   [employer] of Occupational Health - Occupational Stress Questionnaire   . Feeling of Stress  : Not at all  Social Connections: Unknown (12/12/2018)   Social Connection and Isolation Panel   . Frequency of Communication with Friends and Family: Once a week   . Frequency of Social Gatherings with Friends and Family: Once a week   . Attends Religious Services: 1 to 4 times per year   . Active Member of Clubs or Organizations: No   . Attends Banker Meetings: Never   . Marital Status: Patient declined  Intimate Partner Violence: Not At Risk (12/12/2018)   Humiliation, Afraid, Rape, and Kick questionnaire   .  Fear of Current or Ex-Partner: No   . Emotionally Abused: No   . Physically Abused: No   . Sexually Abused: No      Family History  Problem Relation Age of Onset  . CVA Mother   . CAD Father     There were no vitals filed for this visit.  Wt Readings from Last 3 Encounters:  07/09/24 94.8 kg  02/25/24 94.8 kg  07/25/23 90.7 kg   Lab Results  Component Value Date   CREATININE 1.68 (H) 02/25/2024   CREATININE 1.21 06/02/2021   CREATININE 1.78 (H) 03/18/2020    PHYSICAL EXAM:  General: Well appearing. No resp difficulty HEENT: normal Neck: supple, no JVD Cor: Regular rhythm, rate. No rubs, gallops or murmurs Lungs: clear Abdomen: soft, nontender, nondistended. Extremities: no cyanosis, clubbing, rash, edema Neuro: alert & oriented X 3. Moves all 4 extremities w/o difficulty. Affect pleasant   ECG: not done   ASSESSMENT & PLAN:   1: NICM with reduced ejection fraction- - suspect due to HTN - NYHA class I - euvolemic today - weighing daily at group home - weight up 9 lbs from his last visit here 6 months ago - Echo 11/30/2018: EF 20-25%. - Echo 12/04/19: EF 20-25% - Echo 05/09/22: EF 45% along with mild LVH.  - not adding salt to his food - saw cardiology Paul Hernandez) 12/24 - verquvo contraindicated due to patient being on nitrates  - continue farxiga 10mg  daily - continue finerenone 10mg  daily - continue bidil  20/37.5mg  TID - continue  metoprolol  succinate 25mg  daily - continue entresto  97/103mg  BID - continue torsemide  20mg  daily - BMET today - BNP 12/08/19 was 2195.0  2: HTN- - BP 95/69 but no dizziness - [facility] Paul Hernandez)  - BMP 02/25/24 reviewed and showed sodium 135, potassium 3.7, creatinine 1.68 and GFR 47 - BMET today  3: DM- - A1c 04/27/20 was 7.4%; - glucose is checked daily at the group home  4: Renal mass - saw nephrology Paul Hernandez) 05/25 - 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 If no echo done during this time, will get it updated at his next visit.   Paul Blumenthal, RN 08/24/24

## 2024-08-25 ENCOUNTER — Ambulatory Visit: Attending: Family | Admitting: Family

## 2024-08-25 ENCOUNTER — Encounter: Payer: Self-pay | Admitting: Family

## 2024-08-25 VITALS — BP 105/76 | HR 76 | Wt 200.8 lb

## 2024-08-25 DIAGNOSIS — I5022 Chronic systolic (congestive) heart failure: Secondary | ICD-10-CM | POA: Insufficient documentation

## 2024-08-25 DIAGNOSIS — I10 Essential (primary) hypertension: Secondary | ICD-10-CM | POA: Diagnosis not present

## 2024-08-25 DIAGNOSIS — I428 Other cardiomyopathies: Secondary | ICD-10-CM | POA: Insufficient documentation

## 2024-08-25 DIAGNOSIS — Z7984 Long term (current) use of oral hypoglycemic drugs: Secondary | ICD-10-CM | POA: Diagnosis not present

## 2024-08-25 DIAGNOSIS — E119 Type 2 diabetes mellitus without complications: Secondary | ICD-10-CM | POA: Insufficient documentation

## 2024-08-25 DIAGNOSIS — J449 Chronic obstructive pulmonary disease, unspecified: Secondary | ICD-10-CM | POA: Diagnosis not present

## 2024-08-25 DIAGNOSIS — N2889 Other specified disorders of kidney and ureter: Secondary | ICD-10-CM | POA: Insufficient documentation

## 2024-08-25 DIAGNOSIS — Z79899 Other long term (current) drug therapy: Secondary | ICD-10-CM | POA: Diagnosis not present

## 2024-08-25 DIAGNOSIS — I110 Hypertensive heart disease with heart failure: Secondary | ICD-10-CM | POA: Insufficient documentation

## 2024-08-25 DIAGNOSIS — F17210 Nicotine dependence, cigarettes, uncomplicated: Secondary | ICD-10-CM | POA: Diagnosis not present

## 2024-08-25 DIAGNOSIS — F209 Schizophrenia, unspecified: Secondary | ICD-10-CM | POA: Insufficient documentation

## 2024-08-25 NOTE — Patient Instructions (Addendum)
 It was good to see you today!!  Follow up scheduled with Ellouise Class on Monday, November 24, 2024 at 1:30 PM

## 2024-11-21 ENCOUNTER — Telehealth: Payer: Self-pay | Admitting: Family

## 2024-11-21 NOTE — Telephone Encounter (Signed)
 Called to confirm/remind patient of their appointment at the [facility] on 11/24/24.   Appointment:   [x] Confirmed  [] Left mess   [] No answer/No voice mail  [] VM Full/unable to leave message  [] Phone not in service  Patient reminded to bring all medications and/or complete list.  Confirmed patient has transportation. Gave directions, instructed to utilize valet parking.

## 2024-11-23 NOTE — Progress Notes (Unsigned)
 "  [facility] Note     PCP: Sharl Nest, NP Primary Cardiologist: Florencio Kava, MD (last seen 12/24)  Chief Complaint: HF visit   HPI:  Paul Hernandez is a 59 y.o. male with a history of HTN, DM, COPD, schizophrenia, current tobacco use and chronic heart failure.   Echo 11/30/2018: EF 20-25%. Echo 12/04/19: EF 20-25% Echo 05/09/22: EF 45% along with mild LVH. SABRA  Echo 07/08/24: EF 55%, LVH, valves normal, no thrombus or pericardial effusion (results scanned in)  Has not been admitted or been in the ED in the last 6 months.   He presents today with a chief complaint of a HF visit. Specifically denies any shortness of breath, fatigue, chest pain, palpitations, edema or difficulty sleeping. Caregiver reports recent BP of 120/84 & 123/74. Continues to smoke ~ 1/2 ppd of cigarettes. Overall, he report feeling great.    Past Medical History:  Diagnosis Date   CHF (congestive heart failure) (HCC) 12/05/2019   COPD (chronic obstructive pulmonary disease) (HCC) 12/05/2019   Diabetes mellitus without complication (HCC) 12/05/2019   Hypertension 12/05/2019   Mental health disorder 12/05/2019    Current Outpatient Medications  Medication Sig Dispense Refill   acetaminophen  (TYLENOL ) 500 MG tablet Take 500 mg by mouth every 4 (four) hours as needed.     albuterol  (VENTOLIN  HFA) 108 (90 Base) MCG/ACT inhaler Inhale 2 puffs into the lungs every 6 (six) hours as needed for wheezing or shortness of breath. 8 g 0   amiodarone  (PACERONE ) 200 MG tablet Take 1 tablet (200 mg total) by mouth daily. 30 tablet 0   benztropine  (COGENTIN ) 2 MG tablet Take 2 mg by mouth at bedtime as needed for tremors.     Cholecalciferol  (VITAMIN D3) 5000 units CAPS Take by mouth daily.  (Patient not taking: Reported on 07/09/2024)     dapagliflozin propanediol (FARXIGA) 5 MG TABS tablet Take 10 mg by mouth daily.     dextrose  (GLUTOSE) 40 % GEL Take by mouth once as needed for low blood sugar. For  blood sugar <70     diphenhydrAMINE  (SOMINEX) 25 MG tablet Take 25 mg by mouth at bedtime as needed for sleep. (Patient not taking: Reported on 07/09/2024)     ezetimibe  (ZETIA ) 10 MG tablet Take 10 mg by mouth daily.     ferrous sulfate 325 (65 FE) MG tablet Take 325 mg by mouth daily with breakfast.     Finerenone (KERENDIA) 10 MG TABS Take 10 mg by mouth daily.     fluticasone  (FLONASE ) 50 MCG/ACT nasal spray Place 1 spray into both nostrils daily.     formoterol (PERFOROMIST) 20 MCG/2ML nebulizer solution Take 20 mcg by nebulization 2 (two) times daily.     Glycopyrrolate  (LONHALA MAGNAIR  STARTER KIT) 25 MCG/ML SOLN Inhale 25 mcg into the lungs 2 (two) times daily.     haloperidol  decanoate (HALDOL  DECANOATE) 100 MG/ML injection Inject 90 mg into the muscle every 28 (twenty-eight) days.      ipratropium-albuterol  (DUONEB) 0.5-2.5 (3) MG/3ML SOLN Take 3 mLs by nebulization 4 (four) times daily.     isosorbide -hydrALAZINE  (BIDIL ) 20-37.5 MG tablet Take 1 tablet by mouth 3 (three) times daily.     LORazepam  (ATIVAN ) 0.5 MG tablet Take 0.5 mg by mouth every 4 (four) hours as needed (agitation). (Patient not taking: Reported on 07/09/2024)     metFORMIN  (GLUCOPHAGE ) 500 MG tablet Take 500 mg by mouth 2 (two) times daily with a meal.  metoprolol  succinate (TOPROL -XL) 25 MG 24 hr tablet Take 25 mg by mouth daily.     potassium chloride  SA (KLOR-CON ) 20 MEQ tablet TAKE 1/2 TABLET (10 MEQ TOTAL) BY MOUTH DAILY (Patient not taking: Reported on 07/09/2024) 15 tablet 5   predniSONE  (DELTASONE ) 20 MG tablet Take 20 mg by mouth daily.     rosuvastatin  (CRESTOR ) 40 MG tablet Take 40 mg by mouth daily.     sacubitril -valsartan  (ENTRESTO ) 97-103 MG Take 1 tablet by mouth 2 (two) times daily. 60 tablet 5   tirzepatide (MOUNJARO) 2.5 MG/0.5ML Pen Inject 2.5 mg into the skin once a week.     torsemide  (DEMADEX ) 20 MG tablet TAKE 1 TABLET BY MOUTH DAILY 90 tablet 3   vitamin B-12 (CYANOCOBALAMIN) 1000 MCG tablet  Take 1,000 mcg by mouth daily.     No current facility-administered medications for this visit.    Allergies  Allergen Reactions   Paul Hernandez ]       Social History   Socioeconomic History   Marital status: Single    Spouse name: Not on file   Number of children: Not on file   Years of education: Not on file   Highest education level: Not on file  Occupational History   Occupation: disabled  Tobacco Use   Smoking status: Some Days    Current packs/day: 1.00    Average packs/day: 1 pack/day for 35.2 years (35.2 ttl pk-yrs)    Types: Cigarettes    Start date: 09/07/2019   Smokeless tobacco: Never  Vaping Use   Vaping status: Never Used  Substance and Sexual Activity   Alcohol use: No   Drug use: No   Sexual activity: Not Currently  Other Topics Concern   Not on file  Social History Narrative   Not on file   Social Drivers of Health   Tobacco Use: High Risk (08/25/2024)   Patient History    Smoking Tobacco Use: Some Days    Smokeless Tobacco Use: Never    Passive Exposure: Not on file  Financial Resource Strain: Low Risk (09/19/2022)   Received from [facility]   Overall Financial Resource Strain (CARDIA)    Difficulty of Paying Living Expenses: Not very hard  Food Insecurity: No Food Insecurity (09/19/2022)   Received [facility]   Epic    Within the past 12 months, you worried that your food would run out before you got the money to buy more.: Never true    Within the past 12 months, the food you bought just didn't last and you didn't have money to get more.: Never true  Transportation Needs: No Transportation Needs (09/19/2022)   Received from [facility]   PRAPARE - Transportation    Lack of Transportation (Medical): No    Lack of Transportation (Non-Medical): No  Physical Activity: Not on file  Stress: Not on file  Social Connections: Not on file  Intimate Partner Violence: Not on file  Depression (PHQ2-9): Low Risk (12/13/2021)    Depression (PHQ2-9)    PHQ-2 Score: 0  Alcohol Screen: Not on file  Housing: Not on file  Utilities: Not on file  Health Literacy: Medium Risk (09/19/2022)   Received from [facility] Literacy    How often do you need to have someone help you when you read instructions, pamphlets, or other written material from your doctor or pharmacy?: Sometimes      Family History  Problem Relation Age of Onset  CVA Mother    CAD Father    Vitals:   11/24/24 1333 11/24/24 1346  BP: (!) 72/52 90/60  Pulse: 81   SpO2: 97%   Weight: 201 lb 6.4 oz (91.4 kg)    Wt Readings from Last 3 Encounters:  11/24/24 201 lb 6.4 oz (91.4 kg)  08/25/24 200 lb 12.8 oz (91.1 kg)  07/09/24 209 lb (94.8 kg)   Lab Results  Component Value Date   CREATININE 1.68 (H) 02/25/2024   CREATININE 1.21 06/02/2021   CREATININE 1.78 (H) 03/18/2020    PHYSICAL EXAM:  General: Well appearing.  Cor: No JVD. Regular rhythm, rate.  Lungs: clear Abdomen: soft, nontender, nondistended. Extremities: no edema Neuro:. Affect pleasant    ECG: not done   ASSESSMENT & PLAN:   1: NICM with improved ejection fraction- - suspect due to HTN - NYHA class I - euvolemic today - weight stable from his last visit here 3 months ago - Echo 11/30/2018: EF 20-25%. - Echo 12/04/19: EF 20-25% - Echo 05/09/22: EF 45% along with mild LVH.  - Echo 07/08/24: EF 55%, LVH, valves normal, no thrombus or pericardial effusion (results scanned in) - not adding salt to his food - saw cardiology Philippe) 12/24. Will refer back to Dr Florencio - verquvo contraindicated due to patient being on nitrates  - continue farxiga 10mg  daily - continue finerenone 10mg  daily - continue metoprolol  succinate 25mg  daily - Decrease entresto  to 49/51mg  BID due to BP - continue torsemide  20mg  daily - bidil  has been stopped due to hypotension - BNP 12/08/19 was 2195.0  2: HTN- - BP 72/52, After drinking 16 oz water, rechecked BP was 90/60.  Denies any symptoms such as dizziness, blurry vision etc. Facility BP's have been  120's / 70-80's. 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   I spent 35 minutes reviewing records, interviewing/ examing patient and managing plan/ orders.   Paul Hernandez Class FNP-C 11/24/24 "

## 2024-11-24 ENCOUNTER — Encounter: Payer: Self-pay | Admitting: Family

## 2024-11-24 ENCOUNTER — Ambulatory Visit: Attending: Family | Admitting: Family

## 2024-11-24 VITALS — BP 90/60 | HR 81 | Wt 201.4 lb

## 2024-11-24 DIAGNOSIS — J449 Chronic obstructive pulmonary disease, unspecified: Secondary | ICD-10-CM | POA: Insufficient documentation

## 2024-11-24 DIAGNOSIS — I959 Hypotension, unspecified: Secondary | ICD-10-CM | POA: Insufficient documentation

## 2024-11-24 DIAGNOSIS — N2889 Other specified disorders of kidney and ureter: Secondary | ICD-10-CM | POA: Insufficient documentation

## 2024-11-24 DIAGNOSIS — E119 Type 2 diabetes mellitus without complications: Secondary | ICD-10-CM | POA: Diagnosis not present

## 2024-11-24 DIAGNOSIS — E785 Hyperlipidemia, unspecified: Secondary | ICD-10-CM | POA: Insufficient documentation

## 2024-11-24 DIAGNOSIS — F17210 Nicotine dependence, cigarettes, uncomplicated: Secondary | ICD-10-CM | POA: Insufficient documentation

## 2024-11-24 DIAGNOSIS — E782 Mixed hyperlipidemia: Secondary | ICD-10-CM | POA: Diagnosis not present

## 2024-11-24 DIAGNOSIS — Z79899 Other long term (current) drug therapy: Secondary | ICD-10-CM | POA: Insufficient documentation

## 2024-11-24 DIAGNOSIS — F209 Schizophrenia, unspecified: Secondary | ICD-10-CM | POA: Diagnosis not present

## 2024-11-24 DIAGNOSIS — Z7984 Long term (current) use of oral hypoglycemic drugs: Secondary | ICD-10-CM | POA: Insufficient documentation

## 2024-11-24 DIAGNOSIS — I10 Essential (primary) hypertension: Secondary | ICD-10-CM

## 2024-11-24 DIAGNOSIS — I5032 Chronic diastolic (congestive) heart failure: Secondary | ICD-10-CM | POA: Insufficient documentation

## 2024-11-24 DIAGNOSIS — Z886 Allergy status to analgesic agent status: Secondary | ICD-10-CM | POA: Insufficient documentation

## 2024-11-24 DIAGNOSIS — I110 Hypertensive heart disease with heart failure: Secondary | ICD-10-CM | POA: Insufficient documentation

## 2024-11-24 DIAGNOSIS — I428 Other cardiomyopathies: Secondary | ICD-10-CM | POA: Insufficient documentation

## 2024-11-24 DIAGNOSIS — Z7985 Long-term (current) use of injectable non-insulin antidiabetic drugs: Secondary | ICD-10-CM | POA: Insufficient documentation

## 2024-11-24 MED ORDER — SACUBITRIL-VALSARTAN 49-51 MG PO TABS: 1.0000 | ORAL_TABLET | Freq: Two times a day (BID) | ORAL | 6 refills | Status: AC

## 2024-11-24 NOTE — Patient Instructions (Signed)
 Medication Changes:  DECREASE Entresto  to 49-51mg  1 tab two times daily  Follow-Up in: Please follow up with the Advanced [facility] Clinic in 1 month with Ellouise Class, FNP.   Thank you for choosing [facility].    At the Advanced [facility] Failure Clinic, you and your health needs are our priority. We have a designated team specialized in the treatment of Heart Failure. This Care Team includes your primary Heart Failure Specialized Cardiologist (physician), Advanced Practice Providers (APPs- Physician Assistants and Nurse Practitioners), and Pharmacist who all work together to provide you with the care you need, when you need it.   You may see any of the following providers on your designated Care Team at your next follow up:  Dr. Toribio Fuel Dr. Ezra Shuck Dr. Ria Commander Dr. Morene Brownie Ellouise Class, FNP Jaun Bash, RPH-CPP  Please be sure to bring in all your medications bottles to every appointment.   Need to Contact Us :  If you have any questions or concerns before your next appointment please send us  a message through [place] or call our office at [phone].    TO LEAVE A MESSAGE FOR THE NURSE SELECT OPTION 2, PLEASE LEAVE A MESSAGE INCLUDING: YOUR NAME DATE OF BIRTH CALL BACK NUMBER REASON FOR CALL**this is important as we prioritize the call backs  YOU WILL RECEIVE A CALL BACK THE SAME DAY AS LONG AS YOU CALL BEFORE 4:00 PM

## 2024-12-24 ENCOUNTER — Ambulatory Visit: Admitting: Family
